# Patient Record
Sex: Female | Born: 1935 | Race: White | Hispanic: No | Smoking: Never smoker
Health system: Southern US, Community
[De-identification: ages and names within clinical notes are randomized; demographics above are authoritative.]

## PROBLEM LIST (undated history)

## (undated) DIAGNOSIS — M1990 Unspecified osteoarthritis, unspecified site: Secondary | ICD-10-CM

## (undated) DIAGNOSIS — I10 Essential (primary) hypertension: Secondary | ICD-10-CM

## (undated) DIAGNOSIS — E119 Type 2 diabetes mellitus without complications: Secondary | ICD-10-CM

## (undated) DIAGNOSIS — I2510 Atherosclerotic heart disease of native coronary artery without angina pectoris: Secondary | ICD-10-CM

## (undated) DIAGNOSIS — Z8673 Personal history of transient ischemic attack (TIA), and cerebral infarction without residual deficits: Secondary | ICD-10-CM

## (undated) HISTORY — DX: Personal history of transient ischemic attack (TIA), and cerebral infarction without residual deficits: Z86.73

## (undated) HISTORY — PX: CHOLECYSTECTOMY: SHX55

## (undated) HISTORY — PX: APPENDECTOMY: SHX54

---

## 2002-04-06 ENCOUNTER — Encounter: Payer: Self-pay | Admitting: Internal Medicine

## 2002-04-06 ENCOUNTER — Ambulatory Visit (HOSPITAL_COMMUNITY): Admission: RE | Admit: 2002-04-06 | Discharge: 2002-04-06 | Payer: Self-pay | Admitting: Internal Medicine

## 2005-01-09 ENCOUNTER — Ambulatory Visit (HOSPITAL_COMMUNITY): Admission: RE | Admit: 2005-01-09 | Discharge: 2005-01-09 | Payer: Self-pay | Admitting: Internal Medicine

## 2005-03-27 ENCOUNTER — Ambulatory Visit (HOSPITAL_COMMUNITY): Admission: RE | Admit: 2005-03-27 | Discharge: 2005-03-27 | Payer: Self-pay | Admitting: *Deleted

## 2010-01-29 ENCOUNTER — Emergency Department (HOSPITAL_COMMUNITY)
Admission: EM | Admit: 2010-01-29 | Discharge: 2010-01-30 | Payer: Self-pay | Source: Home / Self Care | Admitting: Emergency Medicine

## 2010-08-09 ENCOUNTER — Ambulatory Visit (HOSPITAL_COMMUNITY)
Admission: RE | Admit: 2010-08-09 | Discharge: 2010-08-09 | Payer: Self-pay | Source: Home / Self Care | Attending: Orthopaedic Surgery | Admitting: Orthopaedic Surgery

## 2010-08-20 ENCOUNTER — Ambulatory Visit (HOSPITAL_COMMUNITY)
Admission: RE | Admit: 2010-08-20 | Discharge: 2010-08-20 | Payer: Self-pay | Source: Home / Self Care | Attending: Family Medicine | Admitting: Family Medicine

## 2010-10-02 ENCOUNTER — Ambulatory Visit (INDEPENDENT_AMBULATORY_CARE_PROVIDER_SITE_OTHER): Payer: Self-pay | Admitting: Internal Medicine

## 2010-11-13 ENCOUNTER — Ambulatory Visit (INDEPENDENT_AMBULATORY_CARE_PROVIDER_SITE_OTHER): Payer: Self-pay | Admitting: Internal Medicine

## 2010-12-07 NOTE — Procedures (Signed)
[name] [name], [name] [name]           ACCOUNT NO.:  [id number]   MEDICAL RECORD NO.:  [id number]          PATIENT TYPE:  OUT   LOCATION:  RAD                           FACILITY:  [facility]   PHYSICIAN:  Dani Gobble, MD       DATE OF BIRTH:  [birth year]   DATE OF PROCEDURE:  03/27/2005  DATE OF DISCHARGE:                                  ECHOCARDIOGRAM   PROCEDURE:  Echocardiogram.   REFERRING PHYSICIANS:  1.  Tesfaye D. Felecia Shelling, M.D.  2.  Dani Gobble, MD.   INDICATIONS:  [name] is a 77 year old female with diabetes mellitus  who was found to have a cardiac murmur.   The aorta is within normal limits measured 2.8 cm.   Left atrium also appears to be within normal limits, measured at 3.9 cm. The  patient appeared to be in sinus rhythm during the procedure.   The intraventricular septum appears mildly thickened while the posterior  wall appears normal in thickness.   The aortic valve is not well visualized but appeared to be mildly thickened  but with normal leaflet excursion.  No significant aortic insufficiency is  noted.  Doppler interrogation of the aortic valve suggests mild aortic  sclerosis without hemodynamically significant aortic stenosis.   The mitral valve appears grossly structurally normal with mild mitral  annular calcification.  There is no limitation to leaflet excursion.  No  mitral valve prolapse is noted.  Mild mitral regurgitation is noted.  Doppler interrogation of the mitral valve is within normal limits.   The pulmonic valve is incompletely visualized.   Tricuspid valve appears grossly structurally normal with mild tricuspid  regurgitation noted.  There is no suggestion of  pulmonary hypertension on  this study.   The left ventricle is normal in size with LV IDD measured at 4.5 cm and LV  IC measured 3.1 cm.  Overall left ventricular systolic function is normal  and no regional wall motion abnormalities are noted.  The presence of  diastolic  dysfunction is inferred from pulse wave Doppler across the mitral  valve.   The right atrium and right ventricle appear normal in size and right  ventricular systolic function appears normal.   IMPRESSION:  1.  Mild asymmetric septal hypertrophy.  2.  Mild aortic sclerosis without aortic stenosis.  3.  Mild mitral and tricuspid regurgitation.  4.  Normal left ventricular size and systolic function without regional wall      motion abnormality noted.  5.  Presence of diastolic dysfunction is inferred from pulse wave Doppler      across the mitral valve.           ______________________________  Dani Gobble, MD     AB/MEDQ  D:  03/27/2005  T:  03/28/2005  Job:  [id number]   cc:   Tesfaye D. Felecia Shelling, MD  [address]  [place]  [place] [address]  Fax: [phone]   Dani Gobble, MD  Fax: [phone]

## 2011-01-28 ENCOUNTER — Other Ambulatory Visit: Payer: Self-pay | Admitting: Medical

## 2013-06-30 ENCOUNTER — Inpatient Hospital Stay: Payer: Self-pay | Admitting: Psychiatry

## 2013-06-30 LAB — COMPREHENSIVE METABOLIC PANEL
Albumin: 3.3 g/dL — ABNORMAL LOW (ref 3.4–5.0)
Alkaline Phosphatase: 83 U/L
Anion Gap: 6 — ABNORMAL LOW (ref 7–16)
BUN: 9 mg/dL (ref 7–18)
Bilirubin,Total: 0.5 mg/dL (ref 0.2–1.0)
Calcium, Total: 9.1 mg/dL (ref 8.5–10.1)
Chloride: 104 mmol/L (ref 98–107)
Creatinine: 0.8 mg/dL (ref 0.60–1.30)
EGFR (Non-African Amer.): >60
Osmolality: 277 (ref 275–301)
SGOT(AST): 38 U/L — ABNORMAL HIGH (ref 15–37)
SGPT (ALT): 37 U/L (ref 12–78)

## 2013-06-30 LAB — URINALYSIS, COMPLETE
Bilirubin,UR: NEGATIVE
Blood: NEGATIVE
Glucose,UR: NEGATIVE mg/dL (ref 0–75)
Nitrite: POSITIVE
Protein: NEGATIVE
RBC,UR: 4 /HPF (ref 0–5)
Specific Gravity: 1.014 (ref 1.003–1.030)
WBC UR: 20 /HPF (ref 0–5)

## 2013-06-30 LAB — ETHANOL
Ethanol %: <0.003 % (ref 0.000–0.080)
Ethanol: <3 mg/dL

## 2013-06-30 LAB — CBC
HCT: 45.4 % (ref 35.0–47.0)
MCH: 29.8 pg (ref 26.0–34.0)
MCV: 90 fL (ref 80–100)
Platelet: 210 10*3/uL (ref 150–440)
RDW: 14.2 % (ref 11.5–14.5)

## 2013-06-30 LAB — DRUG SCREEN, URINE
Cocaine Metabolite,Ur ~~LOC~~: NEGATIVE (ref ?–300)
Methadone, Ur Screen: NEGATIVE (ref ?–300)
Phencyclidine (PCP) Ur S: NEGATIVE (ref ?–25)

## 2013-06-30 LAB — SALICYLATE LEVEL: Salicylates, Serum: <1.7 mg/dL

## 2013-07-03 LAB — BASIC METABOLIC PANEL WITH GFR
Anion Gap: 6 — ABNORMAL LOW
BUN: 16 mg/dL
Calcium, Total: 9.1 mg/dL
Chloride: 105 mmol/L
Co2: 28 mmol/L
Creatinine: 1 mg/dL
EGFR (African American): >60
EGFR (Non-African Amer.): 54 — ABNORMAL LOW
Glucose: 177 mg/dL — ABNORMAL HIGH
Osmolality: 283
Potassium: 4.1 mmol/L
Sodium: 139 mmol/L

## 2013-07-03 LAB — URINALYSIS, COMPLETE
Bacteria: NONE SEEN
Bilirubin,UR: NEGATIVE
Blood: NEGATIVE
Glucose,UR: NEGATIVE mg/dL
Ketone: NEGATIVE
Leukocyte Esterase: NEGATIVE
Nitrite: NEGATIVE
Ph: 6
Protein: NEGATIVE
RBC,UR: <1 /HPF
Specific Gravity: 1.009
Squamous Epithelial: <1
WBC UR: NONE SEEN /HPF

## 2013-07-03 LAB — CBC WITH DIFFERENTIAL/PLATELET
Basophil #: 0 10*3/uL (ref 0.0–0.1)
Eosinophil #: 0.2 10*3/uL (ref 0.0–0.7)
Eosinophil %: 2.3 %
HGB: 13.2 g/dL (ref 12.0–16.0)
Lymphocyte #: 1.2 10*3/uL (ref 1.0–3.6)
MCH: 28.6 pg (ref 26.0–34.0)
MCHC: 32 g/dL (ref 32.0–36.0)
Monocyte #: 0.7 x10 3/mm (ref 0.2–0.9)
Monocyte %: 9.5 %
Platelet: 172 10*3/uL (ref 150–440)
RDW: 14 % (ref 11.5–14.5)

## 2013-07-05 LAB — URINE CULTURE

## 2013-07-11 ENCOUNTER — Inpatient Hospital Stay: Payer: Self-pay | Admitting: Student

## 2013-07-11 LAB — URINALYSIS, COMPLETE
Glucose,UR: 50 mg/dL (ref 0–75)
Hyaline Cast: 36
Nitrite: NEGATIVE
Protein: 100
RBC,UR: 2 /HPF (ref 0–5)
Squamous Epithelial: 3

## 2013-07-11 LAB — CREATININE, SERUM: Creatinine: 1.41 mg/dL — ABNORMAL HIGH (ref 0.60–1.30)

## 2013-07-12 LAB — CBC WITH DIFFERENTIAL/PLATELET
Basophil #: 0.2 10*3/uL — ABNORMAL HIGH (ref 0.0–0.1)
Basophil %: 1.2 %
Eosinophil #: 0 10*3/uL (ref 0.0–0.7)
Lymphocyte #: 1.4 10*3/uL (ref 1.0–3.6)
Lymphocyte %: 9.4 %
MCH: 28 pg (ref 26.0–34.0)
MCV: 90 fL (ref 80–100)
Neutrophil #: 12.3 10*3/uL — ABNORMAL HIGH (ref 1.4–6.5)
RBC: 4.32 10*6/uL (ref 3.80–5.20)
RDW: 14.3 % (ref 11.5–14.5)
WBC: 15 10*3/uL — ABNORMAL HIGH (ref 3.6–11.0)

## 2013-07-12 LAB — BASIC METABOLIC PANEL
Co2: 25 mmol/L (ref 21–32)
Creatinine: 1.35 mg/dL — ABNORMAL HIGH (ref 0.60–1.30)
EGFR (African American): 43 — ABNORMAL LOW
EGFR (Non-African Amer.): 38 — ABNORMAL LOW
Osmolality: 287 (ref 275–301)
Sodium: 139 mmol/L (ref 136–145)

## 2013-07-12 LAB — TSH: Thyroid Stimulating Horm: 0.691 u[IU]/mL

## 2013-07-12 LAB — MAGNESIUM: Magnesium: 2 mg/dL

## 2013-07-13 LAB — CBC WITH DIFFERENTIAL/PLATELET
Basophil #: 0 10*3/uL (ref 0.0–0.1)
Eosinophil #: 0.2 10*3/uL (ref 0.0–0.7)
Eosinophil %: 1.9 %
HCT: 35.3 % (ref 35.0–47.0)
Lymphocyte #: 1.4 10*3/uL (ref 1.0–3.6)
MCH: 29.6 pg (ref 26.0–34.0)
Monocyte #: 0.8 x10 3/mm (ref 0.2–0.9)
Neutrophil #: 6.4 10*3/uL (ref 1.4–6.5)
Neutrophil %: 72.5 %
Platelet: 180 10*3/uL (ref 150–440)

## 2013-07-13 LAB — BASIC METABOLIC PANEL
Anion Gap: 5 — ABNORMAL LOW (ref 7–16)
Calcium, Total: 7.8 mg/dL — ABNORMAL LOW (ref 8.5–10.1)
Chloride: 112 mmol/L — ABNORMAL HIGH (ref 98–107)
Co2: 24 mmol/L (ref 21–32)
Creatinine: 0.74 mg/dL (ref 0.60–1.30)
EGFR (African American): >60
EGFR (Non-African Amer.): >60
Osmolality: 284 (ref 275–301)
Potassium: 4.5 mmol/L (ref 3.5–5.1)

## 2013-07-14 LAB — URINE CULTURE

## 2013-07-17 LAB — CULTURE, BLOOD (SINGLE)

## 2014-09-15 IMAGING — CR DG CHEST 1V PORT
1 series · 1 of 1 positions shown · non-contrast
Comparison: 06/30/2013.

CLINICAL DATA: Shock.

EXAM:
PORTABLE CHEST - 1 VIEW

[ap]
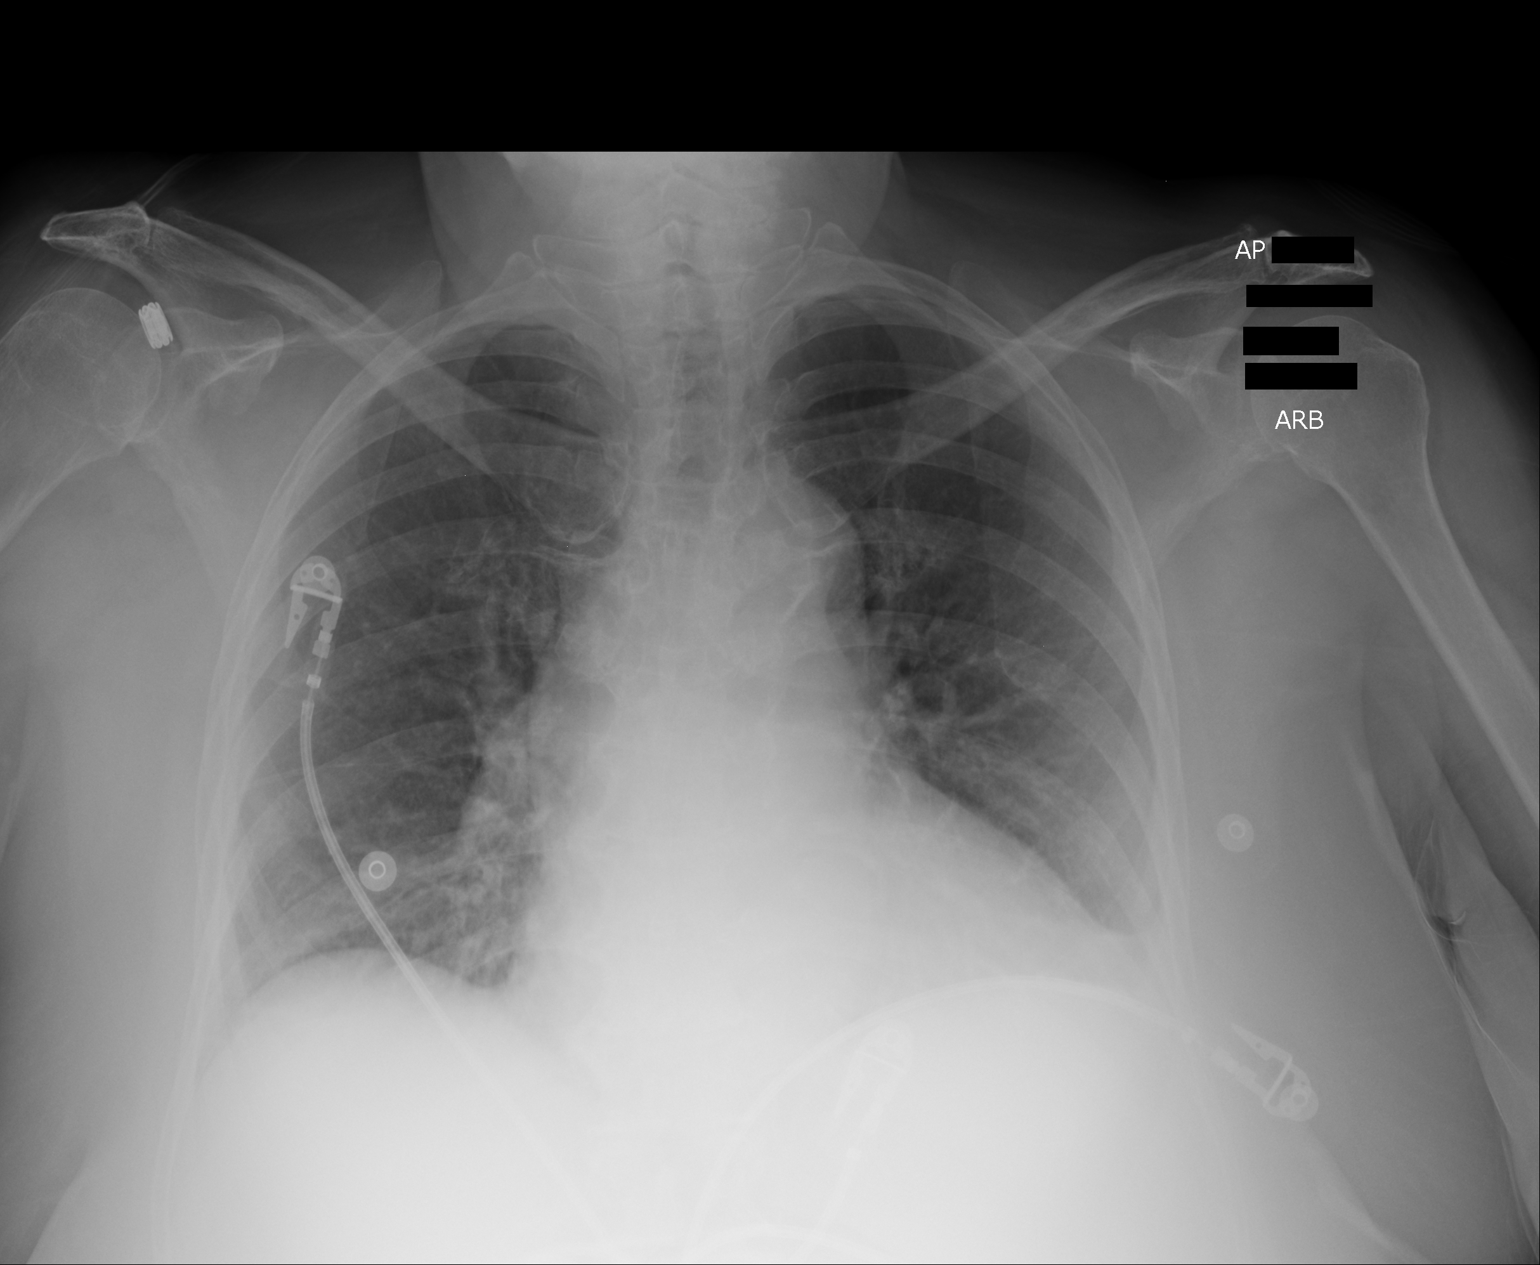

[1 of 1 positions shown; findings below may reference images not displayed]

FINDINGS: Low volume chest. The low volumes accentuate the cardiopericardial
silhouette which is mildly enlarged for projection. Aortic arch
atherosclerosis. Basilar atelectasis. No definite airspace disease
or effusion. Monitoring leads project over the chest.
IMPRESSION: Low volume chest.
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
## 2014-11-11 NOTE — Consult Note (Signed)
Psychiatry: Followup note for this woman with depression and currently being treated on the medical service for urinary tract infection. She tells me today that she is still feeling sick. She feels weak all over and doesn't feel like eating. She still has pain in her right flank. She has not gotten up out of bed today. Her mood is still down and dysphoric. Denies suicidal ideation denies hallucinations. to patient that he urinary tract infection can leave her feeling this sick. Explained to her the importance of staying hydrated. No change to psychiatric medicine. I see from the internal medicine note that she may be ready for transfer back to behavioral health tomorrow. That will be fine. I will followup then.  Electronic Signatures: Clapacs, Jackquline DenmarkJohn T (MD)  (Signed on 23-Dec-14 17:25)  Authored  Last Updated: 23-Dec-14 17:25 by Audery Amellapacs, John T (MD)

## 2014-11-11 NOTE — Consult Note (Signed)
Psychiatry: Followup for this 81 year old woman who had been on the behavioral health unit for depression.s He was transferred to the medical unit after becoming acutely sick. Patient seems to have probably had sepsis related to a urinary tract infection. Today she is awake and alert when I came to see her. She has relatives visiting her. Patient is better groomed than I have seen previously. Her eye contact is good. She spoke in a normal tone. Her affect is still slightly blunted but she says that she is feeling better and she seems more energetic than previously. She has been able to get up and walk around the room a little bit. Not reporting suicidal ideation. I understand that the medicine service preferred to have her stay on medical for another day in part at the patient's request but also because she is still weak. Patient asked me today if she could stay on the medical service until Friday. Told her that was entirely up to the internal medicine doctors. I will be glad to take her back to behavioral health whenever medicine requests. The patient's reasons for staying on the medical floor is that it'll give her another day to recover her strength. No change to psychiatric medicine. Supportive therapy completed. If she does need to be transferred to psychiatry tomorrow please call the psychiatrist on call.  Electronic Signatures: Holland Kotter, Jackquline DenmarkJohn T (MD)  (Signed on 24-Dec-14 16:50)  Authored  Last Updated: 24-Dec-14 16:50 by Audery Amellapacs, Brinklee Cisse T (MD)
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
## 2014-11-11 NOTE — Consult Note (Signed)
Brief Consult Note: Diagnosis: Major depressive disorder.   Patient was seen by consultant.   Consult note dictated.   Recommend further assessment or treatment.   Orders entered.   Comments: Psychiatry: Patient seen for consult. Elderly woman who was recently admitted to behavioral health unit. Transferred to the critical care unit for very low blood pressure and treated for UTI. The initial plan was to trabnsfer patient back to psychiatry. However, she feels much better physically and mentally. She is no longer depressed, affect is brighter. She is not suicidal or homicidal. She is optimistic about the future as she is NOT returning to her old nursing home where she had been very unhappy but a new one that she hopes is better. Her daughter is in the room. Everybody agrees with the plan.   PLAN: 1. The patient does not meet criteria for IVC. Please discharge as appropriate.  2. I will d/c admit to psychiatry order.  3. I will provide Rx for new medications of Remeron 45 mg at night, Abilify 5 mg daily and Cipro twice daily for 3 more days..  Electronic Signatures: Kristine LineaPucilowska, Shalanda Brogden (MD)  (Signed 25-Dec-14 18:39)  Authored: Brief Consult Note   Last Updated: 25-Dec-14 18:39 by Kristine LineaPucilowska, Latise Dilley (MD)

## 2014-11-11 NOTE — Consult Note (Signed)
Brief Consult Note: Diagnosis: major depression.   Patient was seen by consultant.   Recommend further assessment or treatment.   Orders entered.   Comments: Psychiatry: PAtient seen. Patient with depression. [facility]  Electronic Signatures: Clapacs, Jackquline DenmarkJohn T (MD)  (Signed 10-Dec-14 18:53)  Authored: Brief Consult Note   Last Updated: 10-Dec-14 18:53 by Audery Amellapacs, John T (MD)

## 2014-11-11 NOTE — Consult Note (Signed)
Brief Consult Note: Diagnosis: Maj. depression.   Patient was seen by consultant.   Consult note dictated.   Comments: Psychiatry: Patient seen for consult. Elderly woman who was recently on my service on the behavioral health unit. Transferred to the critical care unit yesterday after becoming faint and weak and being found with very low blood pressure. Patient seems to be recovering. Mood is still anxious but about the same as it was before. Full note dictated. No change to medicine. She can be transferred her back to psychiatry as soon as she is medically stable.  Electronic Signatures: Clapacs, Jackquline DenmarkJohn T (MD)  (Signed 22-Dec-14 14:49)  Authored: Brief Consult Note   Last Updated: 22-Dec-14 14:49 by Audery Amellapacs, John T (MD)

## 2014-11-12 NOTE — Discharge Summary (Signed)
PATIENT NAME:  [name] [name], [name] [name] MR#:  [id number] DATE OF BIRTH:  1933/03/11  DATE OF ADMISSION:  07/11/2013 DATE OF DISCHARGE:  07/15/2013   CONSULTANTS: Dr. Toni Amend from psychiatry.   CHIEF COMPLAINT: Patient transferred from Behavioral Medicine for a systolic blood pressure in the 60s.   DISCHARGE DIAGNOSES:  1. Sepsis.  2. Urinary tract infection.  3. Hypotension, possibly from sepsis, possible from vasovagal response.  4. Mild acute renal failure.  5. Diabetes.  6. Hypertension.  7. Anxiety.  8. Depression.   DISCHARGE MEDICATIONS:  1. Cipro 250 mg 2 times a day until finished night of the 28th of December.  2. Lisinopril 20 mg daily.  3. Mirtazapine 45 mg at bedtime.  4. Metformin 500 mg 2 times a day. 5. Aripiprazole 5 mg once a day.  6. Diazepam 5 mg once a day at bedtime and 2.5 mg once a day in the morning.   DIET: Low-sodium, ADA diet.   ACTIVITY: As tolerated.   FOLLOWUP: Please follow with PCP within 1 to 2 weeks.   DISPOSITION: Transfer back to Behavioral Medicine.   SIGNIFICANT LABORATORIES AND IMAGING: Initial creatinine of 1.41, last creatinine of 0.74, last sodium 141, potassium 4.5. Initial white count of 15, hemoglobin 12.1, last white count of 8.8. Blood cultures have been no growth to date from the 22nd. Urine cultures from the 21st grew Klebsiella, Enterococcus and Proteus. UA on arrival was suggestive for UTI with 1+ leukocyte esterase, 23 WBC, trace bacteria. X-ray of the chest, 1-view: Low volume chest, otherwise unremarkable. Repeat x-ray on the 23rd of the chest showing mild left basilar airspace opacity, may reflect atelectasis or possible pneumonia, small left pleural effusion.   HISTORY OF PRESENT ILLNESS AND HOSPITAL COURSE: For full details of H and P, please see the dictation on December 21 by Dr. Mordecai Maes, but briefly this is a 81 year old female who was at Behavioral Medicine, who developed hypotension. The patient had come in with depression,  poor concentration and lack of interest. She had gone to the bathroom and felt weak, could not get up and felt her face was hot, had some nausea and was noted to have a systolic blood pressure in the 60s during a rapid response. She had no fever. She had been treated for UTI. She initially was started on IV fluids and transitioned to CCU for possible shock; however, the patient did not require any pressors and did not have shock. The patient did have transient hypotension, and per description, could have been secondary to a vasovagal response, but I suspect that given the patient's sepsis with leukocytosis, tachycardia and dirty UA as well as positive urine cultures, sepsis played a larger roll. Her blood pressure medications were held, and after hypotension improved, the blood pressure medications were continued. The urine cultures did grow 3 different organisms, but they are all susceptible to Cipro. She will be discharged with Cipro until the end of the 28th. Her blood pressure currently is in the 150s to 160s range, and her lisinopril has been continued. If the blood pressure remains elevated, low-dose amlodipine can be started. The patient did have mild renal failure as well, possibly from sepsis, which has resolved with IV fluids. She will be transitioned back to Behavioral Medicine for her ongoing depression, anxiety issues.   TOTAL TIME SPENT: 35 minutes.   CODE STATUS: The patient is full code.   ____________________________ Krystal Eaton, MD sa:lb D: 07/15/2013 12:31:58 ET T: 07/15/2013 12:49:13 ET JOB#: [id number]  cc:  Krystal EatonShayiq Tenzin Pavon, MD, <Dictator> Krystal EatonSHAYIQ Izabelle Daus MD ELECTRONICALLY SIGNED 08/03/2013 11:18

## 2014-11-23 ENCOUNTER — Encounter (HOSPITAL_COMMUNITY): Payer: Self-pay | Admitting: *Deleted

## 2014-11-23 ENCOUNTER — Emergency Department (HOSPITAL_COMMUNITY)
Admission: EM | Admit: 2014-11-23 | Discharge: 2014-11-23 | Disposition: A | Discharge status: Home / Self Care | Payer: Medicare Other | Attending: Emergency Medicine | Admitting: Emergency Medicine

## 2014-11-23 DIAGNOSIS — E119 Type 2 diabetes mellitus without complications: Secondary | ICD-10-CM | POA: Insufficient documentation

## 2014-11-23 DIAGNOSIS — I10 Essential (primary) hypertension: Secondary | ICD-10-CM | POA: Diagnosis not present

## 2014-11-23 DIAGNOSIS — R600 Localized edema: Secondary | ICD-10-CM | POA: Insufficient documentation

## 2014-11-23 DIAGNOSIS — Z5900 Homelessness unspecified: Secondary | ICD-10-CM

## 2014-11-23 DIAGNOSIS — I25119 Atherosclerotic heart disease of native coronary artery with unspecified angina pectoris: Secondary | ICD-10-CM | POA: Insufficient documentation

## 2014-11-23 DIAGNOSIS — M1990 Unspecified osteoarthritis, unspecified site: Secondary | ICD-10-CM | POA: Diagnosis not present

## 2014-11-23 DIAGNOSIS — M7989 Other specified soft tissue disorders: Secondary | ICD-10-CM | POA: Diagnosis present

## 2014-11-23 DIAGNOSIS — Z79899 Other long term (current) drug therapy: Secondary | ICD-10-CM | POA: Insufficient documentation

## 2014-11-23 HISTORY — DX: Unspecified osteoarthritis, unspecified site: M19.90

## 2014-11-23 HISTORY — DX: Type 2 diabetes mellitus without complications: E11.9

## 2014-11-23 HISTORY — DX: Atherosclerotic heart disease of native coronary artery without angina pectoris: I25.10

## 2014-11-23 HISTORY — DX: Essential (primary) hypertension: I10

## 2014-11-23 LAB — CBC WITH DIFFERENTIAL/PLATELET
Basophils Absolute: 0 10*3/uL (ref 0.0–0.1)
Basophils Relative: 0 % (ref 0–1)
Eosinophils Absolute: 0.1 10*3/uL (ref 0.0–0.7)
Eosinophils Relative: 2 % (ref 0–5)
HCT: 46.9 % — ABNORMAL HIGH (ref 36.0–46.0)
Hemoglobin: 15 g/dL (ref 12.0–15.0)
Lymphocytes Relative: 20 % (ref 12–46)
Lymphs Abs: 1.5 10*3/uL (ref 0.7–4.0)
MCH: 29.5 pg (ref 26.0–34.0)
MCHC: 32 g/dL (ref 30.0–36.0)
MCV: 92.1 fL (ref 78.0–100.0)
Monocytes Absolute: 0.5 10*3/uL (ref 0.1–1.0)
Monocytes Relative: 7 % (ref 3–12)
Neutro Abs: 5.3 10*3/uL (ref 1.7–7.7)
Neutrophils Relative %: 71 % (ref 43–77)
Platelets: 215 10*3/uL (ref 150–400)
RBC: 5.09 MIL/uL (ref 3.87–5.11)
RDW: 13.3 % (ref 11.5–15.5)
WBC: 7.5 10*3/uL (ref 4.0–10.5)

## 2014-11-23 LAB — CBG MONITORING, ED: GLUCOSE-CAPILLARY: 95 mg/dL (ref 70–99)

## 2014-11-23 LAB — COMPREHENSIVE METABOLIC PANEL
ALT: 16 U/L (ref 14–54)
AST: 29 U/L (ref 15–41)
Albumin: 3.6 g/dL (ref 3.5–5.0)
Alkaline Phosphatase: 83 U/L (ref 38–126)
Anion gap: 9 (ref 5–15)
BUN: 9 mg/dL (ref 6–20)
CO2: 28 mmol/L (ref 22–32)
Calcium: 9.2 mg/dL (ref 8.9–10.3)
Chloride: 102 mmol/L (ref 101–111)
Creatinine, Ser: 0.83 mg/dL (ref 0.44–1.00)
GFR calc Af Amer: >60 mL/min (ref >60)
GFR calc non Af Amer: >60 mL/min (ref >60)
Glucose, Bld: 116 mg/dL — ABNORMAL HIGH (ref 70–99)
Potassium: 4.3 mmol/L (ref 3.5–5.1)
Sodium: 139 mmol/L (ref 135–145)
Total Bilirubin: 0.6 mg/dL (ref 0.3–1.2)
Total Protein: 7.4 g/dL (ref 6.5–8.1)

## 2014-11-23 MED ORDER — FUROSEMIDE 40 MG PO TABS
40.0000 mg | ORAL_TABLET | Freq: Once | ORAL | Status: AC
  Administered 2014-11-23: 40 mg via ORAL
  Filled 2014-11-23: qty 1

## 2014-11-23 MED ORDER — ENALAPRIL MALEATE 10 MG PO TABS
10.0000 mg | ORAL_TABLET | Freq: Every day | ORAL | Status: DC
Start: 2014-11-23 — End: 2019-08-03

## 2014-11-23 MED ORDER — OXYCODONE-ACETAMINOPHEN 5-325 MG PO TABS: 1.0000 | ORAL_TABLET | ORAL | Status: DC | PRN

## 2014-11-23 MED ORDER — POTASSIUM CHLORIDE CRYS ER 20 MEQ PO TBCR
40.0000 meq | EXTENDED_RELEASE_TABLET | Freq: Once | ORAL | Status: AC
  Administered 2014-11-23: 40 meq via ORAL
  Filled 2014-11-23: qty 2

## 2014-11-23 NOTE — ED Notes (Signed)
Patient states she feels like blood sugar is dropping.  Checked CBG = 95. Placed patient behind triage. Gave sprite to drink.

## 2014-11-23 NOTE — Discharge Instructions (Signed)
°Emergency Department Resource Guide °1) Find a Doctor and Pay Out of Pocket °Although you won't have to find out who is covered by your insurance plan, it is a good idea to ask around and get recommendations. You will then need to call the office and see if the doctor you have chosen will accept you as a new patient and what types of options they offer for patients who are self-pay. Some doctors offer discounts or will set up payment plans for their patients who do not have insurance, but you will need to ask so you aren't surprised when you get to your appointment. ° °2) Contact Your Local Health Department °Not all health departments have doctors that can see patients for sick visits, but many do, so it is worth a call to see if yours does. If you don't know where your local health department is, you can check in your phone book. The CDC also has a tool to help you locate your state's health department, and many state websites also have listings of all of their local health departments. ° °3) Find a Walk-in Clinic °If your illness is not likely to be very severe or complicated, you may want to try a walk in clinic. These are popping up all over the country in pharmacies, drugstores, and shopping centers. They're usually staffed by nurse practitioners or physician assistants that have been trained to treat common illnesses and complaints. They're usually fairly quick and inexpensive. However, if you have serious medical issues or chronic medical problems, these are probably not your best option. ° °No Primary Care Doctor: °- Call [facility] at  832-8000 - they can help you locate a primary care doctor that  accepts your insurance, provides certain services, etc. °- Physician Referral Service- 1-800-533-3463 ° °Chronic Pain Problems: °Organization         Address  Phone   Notes  °[facility]  (336) 297-2271 Patients need to be referred by their primary care doctor.  ° °Medication  Assistance: °Organization         Address  Phone   Notes  °[place] [facility] [address] °[place], [place] [address] (336) 641-8030 --Must be a resident of [place] °-- Must have NO insurance coverage whatsoever (no Medicaid/ Medicare, etc.) °-- The pt. MUST have a primary care doctor that directs their care regularly and follows them in the community °  °[facility]  (866) 331-1348   °[employer]  (888) 892-1162   ° °Agencies that provide inexpensive medical care: °Organization         Address  Phone   Notes  °[facility]  (336) 832-8035   °[facility]    (336) 832-7272   °[facility] [address] °[place], [place] [address] (336) 832-4777   °[facility] [address], °[place] (336) 271-4999   °[facility]    (336) 373-0678   °[facility]    (336) 272-1050   °[facility] ° [address], [place] Phone:  (336) 832-4444, Fax:  (336) 832-4440 Hours of Operation:  9 am - 6 pm, M-F.  Also accepts Medicaid/Medicare and self-pay.  °[facility] ° [address], [place] Phone: (336) 832-3150, Fax: (336) 832-3151. Hours of Operation:  8:30 am - 5:30 pm, M-F.  Also accepts Medicaid and self-pay.  °[facility] [place] [address], [place] Phone: (336) 878-6027   °[facility] [address], [place], [place] (336)723-1848, Ext. 123 Mondays & Thursdays: 7-9 AM.  First 15 patients are seen on a first come, first serve basis. °  ° °Medicaid-accepting [place] Providers: ° °Organization         Address  Phone   Notes  °[facility] [address], [place] (336) 641-2100 Also accepts self-pay patients.  °[facility] [address], [place] ° (336) 856-9996   °[facility] [address], [place]  (336) 288-8857   °[facility] [address] [place] [address], [place] (336) 299-7000   °Veita Bland [address], [place]  ° (336) 373-1557 Only accepts [place] Medicaid patients after they have their name applied to their card.  ° °Self-Pay (no insurance) in [place]: ° °Organization         Address  Phone   Notes  °Sickle Cell Patients, [facility] [address], [place] (336) 832-1970   °[facility] [address], [place] (336) 832-4400   °[facility] ° [address], [place] (336) 992-4800   °[facility]/Dr. Osei-Bonsu ° [address] [place] [address], [place] or [address], [place] (336) 841-8500 Phone number for both [place] and [place] locations is the same.  °[facility] [address], [place] (336) 299-0000   °[facility] [address] [place] [address], [place] or [address] (336) 852-7530 °(336) 878-2260   °[facility] [address], [place] (336) 350-1642, phone; (336) 294-5005, fax Sees patients 1st and 3rd Saturday of every month.  Must not qualify for public or private insurance (i.e. Medicaid, Medicare, [place] Health Choice, Veterans' Benefits) • Household income should be no more than 200% of the poverty level •The clinic cannot treat you if you are pregnant or think you are pregnant • Sexually transmitted diseases are not treated at the clinic.  ° ° °Dental Care: °Organization         Address  Phone  Notes  °[place] [facility] [address], [place] (336) 641-6152 Accepts children up to age 21 who are enrolled in Medicaid or [place] Health Choice; pregnant women with a Medicaid card; and children who have applied for Medicaid or [place] Health Choice, but were declined, whose parents can pay a reduced fee at time of service.  °[place]  [facility] [place]  [address], [place] (336) 641-7733 Accepts children up to age 21 who are enrolled in Medicaid or [place] Health Choice; pregnant women with a Medicaid card; and children who have applied for Medicaid or [place] Health Choice, but were declined, whose parents can pay a reduced fee at time of service.  °[facility] ° [address], [place] (336) 641-4533 Patients are seen by appointment only. Walk-ins are not accepted. [facility] will see patients 18 years of age and older. °Monday - Tuesday (8am-5pm) °Most Wednesdays (8:30-5pm) °$30 per visit, cash only  °[facility] ° [address], [place] (336) 641-4533 Patients are seen by appointment only. Walk-ins are not accepted. [facility] will see patients 18 years of age and older. °One   Wednesday Evening (Monthly: Volunteer Based).  $30 per visit, cash only  °[facility]  (919) 537-3737 for adults; Children under age 4, call Graduate Pediatric Dentistry at (919) 537-3956. Children aged 4-14, please call (919) 537-3737 to request a pediatric application. ° Dental services are provided in all areas of dental care including fillings, crowns and bridges, complete and partial dentures, implants, gum treatment, root canals, and extractions. Preventive care is also provided. Treatment is provided to both adults and children. °Patients are selected via a lottery and there is often a waiting list. °  °[facility] [address], °[place] ° (336) 763-8833 [email or web address] °  °[facility] [address], [place], [place] (336)723-1848, Ext. 123 Second and Fourth Thursday of each month, opens at 6:30 AM; Clinic ends at 9 AM.  Patients are seen on a first-come first-served basis, and a limited number are seen during each clinic.  ° °[facility] ° [address], [place], [place] (336) 723-7904    Eligibility Requirements °You must have lived in [place], [place], or [place] counties for at least the last three months. °  You cannot be eligible for state or federal sponsored healthcare insurance, including Veterans Administration, Medicaid, or Medicare. °  You generally cannot be eligible for healthcare insurance through your employer.  °  How to apply: °Eligibility screenings are held every Tuesday and Wednesday afternoon from 1:00 pm until 4:00 pm. You do not need an appointment for the interview!  °[facility] [address], [place], [place] 336-631-2330   °[facility]  336-342-8273   °[facility]  336-703-3100   °[facility]  336-570-6415   ° °Behavioral Health Resources in the Community: °Intensive Outpatient Programs °Organization         Address  Phone  Notes  °[facility] [address], [place], [place] 336-878-6098   °[facility] [address], [place], [place] 336-832-9800   °[facility] [address], [place], [place] ° 336-882-2125   °[facility] [address],  °[place], [place] 1-800-853-5163 or 336-641-4981   °Substance Abuse Resources °Organization         Address  Phone  Notes  °[facility]  336-882-2125   °[facility]  336-784-9470   °[facility]  336-285-9073   °[facility]  336-845-3988   °Residential & Outpatient Substance Abuse Program  1-800-659-3381   °Psychological Services °Organization         Address  Phone  Notes  °[facility]  336- 832-9600   °[facility]  336- 378-7881   °[facility] [address], [place] 1-800-853-5163 or 336-641-4981   ° °Mobile Crisis Teams °Organization         Address  Phone  Notes  °[facility], Mobile Crisis Care Unit  1-877-626-1772   °Assertive °[facility] ° [address]  [place], [place] 336-834-9664   °Sharon DeEsch [address] °[place] 336-554-5454   ° °Self-Help/Support Groups °Organization         Address  Phone             Notes  °[facility] [place] - variety of support groups  336- 373-1402 Call for more information  °Narcotics Anonymous (NA), [facility] [address], °[place]   2 meetings at this location  ° °  Residential Treatment Programs °Organization         Address  Phone  Notes  °[facility] [address],    °[place]  1-866-801-8205   °[facility] ° [address], [place], [place] 704-293-8524   °[facility] [address], [place] 336-845-3988 Admissions: 8am-3pm M-F  °[facility] [address]    °[place], [place] 336-841-1104   °[facility] [address], [place], [place] 336-379-7146   °[facility] [address]  °[place], [place] 336-285-9073   °[facility] - Intensive Outpatient [address], [place], [place] 336-852-3033   °[facility] [address]  °[place], [place] 1-877-615-2722 or 336-784-9470   °[facility] [address] [place], [place] 336-227-7417 Accepts Medicaid  °[facility] [address]  °[place] 1-800-659-3381 Substance Abuse/Addiction Treatment  ° °[place] Behavioral Health Resources °Organization         Address  Phone  Notes  °[facility]  (888) 581-9988   °Julie Brannon, PhD [address] [place], [place]   (336) 349-5553 or (336) 951-0000   °[facility]   [address] °[place], [place] (336) 349-4454   °[facility] [address], [place], [place] (336) 342-8316 Insurance/Medicaid/sponsorship through [facility]  °[facility] [address]                                    [place], [place] (336) 342-8316 Therapy/tele-psych/case    °[facility] [address]  ° [place], [place] (336) 349-2233    °Dr. Arfeen  (336) 349-4544   °[facility] [place]  [employer] [place] [facility] 1) [address], [place] °2) [address], [place] °3)  [address], [place] (336) 349-3220 °(336) 342-7768 ° °(336) 342-8140   °[place] Child Abuse [facility] (336) 342-1394 or (336) 342-3537 (After Hours)    ° ° °

## 2014-11-23 NOTE — ED Notes (Addendum)
Pt brought in EMS, EMS reports pt sent here by Sharlet SalinaSharon Neville from DSS. Per EMS, pt living out of car and has increased confusion,paranoia, leg swelling x1 month. Pt alert,mild confusion noted, calm and cooperative in triage. CBG en route 86. Per EMS, pt also without daily meds "for awhile."

## 2014-11-30 NOTE — ED Provider Notes (Signed)
CSN: [id number]     Arrival date & time 11/23/14  1204 History   First MD Initiated Contact with Patient 11/23/14 1609     Chief Complaint  Patient presents with  . Leg Swelling     (Consider location/radiation/quality/duration/timing/severity/associated sxs/prior Treatment) HPI   81yF apparently advised to come to ED by DSS. Homeless and did know what else to do. Apparently had been staying in motel but left because of expense. Had been staying in car with daughter but she left and cannot contact her? Gets $750 from SS monthly. Concerned about not having her meds and LE swelling. No pain. No respiratory complaints.   Past Medical History  Diagnosis Date  . Diabetes mellitus without complication   . Arthritis   . Hypertension   . Coronary artery disease     angina   Past Surgical History  Procedure Laterality Date  . Cholecystectomy    . Appendectomy     History reviewed. No pertinent family history. History  Substance Use Topics  . Smoking status: Never Smoker   . Smokeless tobacco: Never Used  . Alcohol Use: No   OB History    No data available     Review of Systems  All systems reviewed and negative, other than as noted in HPI.   Allergies  Contrast media  Home Medications   Prior to Admission medications   Medication Sig Start Date End Date Taking? Authorizing Provider  metFORMIN (GLUCOPHAGE) 500 MG tablet Take 1 tablet by mouth 2 (two) times daily. 11/07/14  Yes Historical Provider, MD  traMADol (ULTRAM) 50 MG tablet Take 1 tablet by mouth every 4 (four) hours as needed. pain 11/07/14  Yes Historical Provider, MD  enalapril (VASOTEC) 10 MG tablet Take 1 tablet (10 mg total) by mouth daily. 11/23/14   Raeford RazorStephen Nakesha Ebrahim, MD  oxyCODONE-acetaminophen (PERCOCET/ROXICET) 5-325 MG per tablet Take 1-2 tablets by mouth every 4 (four) hours as needed. 11/23/14   Raeford RazorStephen Marche Hottenstein, MD   BP 159/77 mmHg  Pulse 68  Temp(Src) 97.7 F (36.5 C) (Oral)  Resp 18  Ht 5\' 4"  (1.626 m)   Wt 170 lb (77.111 kg)  BMI 29.17 kg/m2  SpO2 97% Physical Exam  Constitutional: She is oriented to person, place, and time. She appears well-developed and well-nourished. No distress.  HENT:  Head: Normocephalic and atraumatic.  Eyes: Conjunctivae are normal. Right eye exhibits no discharge. Left eye exhibits no discharge.  Neck: Neck supple.  Cardiovascular: Normal rate, regular rhythm and normal heart sounds.  Exam reveals no gallop and no friction rub.   No murmur heard. Pulmonary/Chest: Effort normal and breath sounds normal. No respiratory distress.  Abdominal: Soft. She exhibits no distension. There is no tenderness.  Musculoskeletal: She exhibits edema. She exhibits no tenderness.  Mild symmetric LE edema. Lower extremities symmetric as compared to each other. No calf tenderness. Negative Homan's. No palpable cords.   Neurological: She is alert and oriented to person, place, and time. No cranial nerve deficit. She exhibits normal muscle tone. Coordination normal.  Skin: Skin is warm and dry.  Psychiatric: She has a normal mood and affect. Her behavior is normal. Thought content normal.  Nursing note and vitals reviewed.   ED Course  Procedures (including critical care time) Labs Review Labs Reviewed  CBC WITH DIFFERENTIAL/PLATELET - Abnormal; Notable for the following:    HCT 46.9 (*)    All other components within normal limits  COMPREHENSIVE METABOLIC PANEL - Abnormal; Notable for the following:  Glucose, Bld 116 (*)    All other components within normal limits  CBG MONITORING, ED    Imaging Review No results found.   EKG Interpretation None      MDM   Final diagnoses:  Homelessness  Essential hypertension    81yF who apparently was referred to ED because homeless and DSS felt this was best option. Resources provided to patient. She is not interested in staying in ED to speak with social work in am.     Kadrian Partch KohuRaeford Razort, MD 11/30/14 [phone]

## 2015-07-29 DIAGNOSIS — R21 Rash and other nonspecific skin eruption: Secondary | ICD-10-CM | POA: Diagnosis not present

## 2015-07-29 DIAGNOSIS — I10 Essential (primary) hypertension: Secondary | ICD-10-CM | POA: Diagnosis not present

## 2015-08-16 DIAGNOSIS — R21 Rash and other nonspecific skin eruption: Secondary | ICD-10-CM | POA: Diagnosis not present

## 2015-08-16 DIAGNOSIS — J309 Allergic rhinitis, unspecified: Secondary | ICD-10-CM | POA: Diagnosis not present

## 2015-08-24 DIAGNOSIS — R21 Rash and other nonspecific skin eruption: Secondary | ICD-10-CM | POA: Diagnosis not present

## 2015-09-28 DIAGNOSIS — J029 Acute pharyngitis, unspecified: Secondary | ICD-10-CM | POA: Diagnosis not present

## 2015-10-08 DIAGNOSIS — I10 Essential (primary) hypertension: Secondary | ICD-10-CM | POA: Diagnosis not present

## 2015-10-08 DIAGNOSIS — E119 Type 2 diabetes mellitus without complications: Secondary | ICD-10-CM | POA: Diagnosis not present

## 2015-10-08 DIAGNOSIS — J019 Acute sinusitis, unspecified: Secondary | ICD-10-CM | POA: Diagnosis not present

## 2015-10-15 DIAGNOSIS — I10 Essential (primary) hypertension: Secondary | ICD-10-CM | POA: Diagnosis not present

## 2015-10-15 DIAGNOSIS — J019 Acute sinusitis, unspecified: Secondary | ICD-10-CM | POA: Diagnosis not present

## 2015-10-15 DIAGNOSIS — E119 Type 2 diabetes mellitus without complications: Secondary | ICD-10-CM | POA: Diagnosis not present

## 2015-10-22 DIAGNOSIS — J019 Acute sinusitis, unspecified: Secondary | ICD-10-CM | POA: Diagnosis not present

## 2015-10-22 DIAGNOSIS — E119 Type 2 diabetes mellitus without complications: Secondary | ICD-10-CM | POA: Diagnosis not present

## 2015-10-22 DIAGNOSIS — I10 Essential (primary) hypertension: Secondary | ICD-10-CM | POA: Diagnosis not present

## 2015-10-30 DIAGNOSIS — F419 Anxiety disorder, unspecified: Secondary | ICD-10-CM | POA: Diagnosis not present

## 2015-11-05 DIAGNOSIS — I10 Essential (primary) hypertension: Secondary | ICD-10-CM | POA: Diagnosis not present

## 2015-11-05 DIAGNOSIS — F419 Anxiety disorder, unspecified: Secondary | ICD-10-CM | POA: Diagnosis not present

## 2015-11-05 DIAGNOSIS — E119 Type 2 diabetes mellitus without complications: Secondary | ICD-10-CM | POA: Diagnosis not present

## 2015-11-12 DIAGNOSIS — I10 Essential (primary) hypertension: Secondary | ICD-10-CM | POA: Diagnosis not present

## 2015-11-12 DIAGNOSIS — E119 Type 2 diabetes mellitus without complications: Secondary | ICD-10-CM | POA: Diagnosis not present

## 2015-11-12 DIAGNOSIS — F419 Anxiety disorder, unspecified: Secondary | ICD-10-CM | POA: Diagnosis not present

## 2015-12-03 DIAGNOSIS — E119 Type 2 diabetes mellitus without complications: Secondary | ICD-10-CM | POA: Diagnosis not present

## 2015-12-03 DIAGNOSIS — I10 Essential (primary) hypertension: Secondary | ICD-10-CM | POA: Diagnosis not present

## 2015-12-03 DIAGNOSIS — F419 Anxiety disorder, unspecified: Secondary | ICD-10-CM | POA: Diagnosis not present

## 2016-04-24 DIAGNOSIS — K7460 Unspecified cirrhosis of liver: Secondary | ICD-10-CM | POA: Diagnosis not present

## 2016-04-24 DIAGNOSIS — I10 Essential (primary) hypertension: Secondary | ICD-10-CM | POA: Diagnosis not present

## 2016-04-24 DIAGNOSIS — Z780 Asymptomatic menopausal state: Secondary | ICD-10-CM | POA: Diagnosis not present

## 2016-04-24 DIAGNOSIS — F419 Anxiety disorder, unspecified: Secondary | ICD-10-CM | POA: Diagnosis not present

## 2016-04-24 DIAGNOSIS — R300 Dysuria: Secondary | ICD-10-CM | POA: Diagnosis not present

## 2016-04-24 DIAGNOSIS — E118 Type 2 diabetes mellitus with unspecified complications: Secondary | ICD-10-CM | POA: Diagnosis not present

## 2016-04-24 DIAGNOSIS — N390 Urinary tract infection, site not specified: Secondary | ICD-10-CM | POA: Diagnosis not present

## 2016-04-24 DIAGNOSIS — R05 Cough: Secondary | ICD-10-CM | POA: Diagnosis not present

## 2016-04-25 DIAGNOSIS — M545 Low back pain: Secondary | ICD-10-CM | POA: Diagnosis not present

## 2016-05-09 ENCOUNTER — Other Ambulatory Visit: Payer: Self-pay | Admitting: Internal Medicine

## 2016-05-09 DIAGNOSIS — R945 Abnormal results of liver function studies: Secondary | ICD-10-CM

## 2016-08-13 DIAGNOSIS — K7689 Other specified diseases of liver: Secondary | ICD-10-CM | POA: Diagnosis not present

## 2016-08-13 DIAGNOSIS — E118 Type 2 diabetes mellitus with unspecified complications: Secondary | ICD-10-CM | POA: Diagnosis not present

## 2016-08-13 DIAGNOSIS — I10 Essential (primary) hypertension: Secondary | ICD-10-CM | POA: Diagnosis not present

## 2016-08-13 DIAGNOSIS — E7800 Pure hypercholesterolemia, unspecified: Secondary | ICD-10-CM | POA: Diagnosis not present

## 2016-08-13 DIAGNOSIS — Z5181 Encounter for therapeutic drug level monitoring: Secondary | ICD-10-CM | POA: Diagnosis not present

## 2016-08-14 ENCOUNTER — Other Ambulatory Visit: Payer: Self-pay | Admitting: Internal Medicine

## 2016-08-14 DIAGNOSIS — R945 Abnormal results of liver function studies: Secondary | ICD-10-CM

## 2016-11-05 DIAGNOSIS — B351 Tinea unguium: Secondary | ICD-10-CM | POA: Diagnosis not present

## 2016-11-05 DIAGNOSIS — R21 Rash and other nonspecific skin eruption: Secondary | ICD-10-CM | POA: Diagnosis not present

## 2016-11-05 DIAGNOSIS — F419 Anxiety disorder, unspecified: Secondary | ICD-10-CM | POA: Diagnosis not present

## 2016-11-20 DIAGNOSIS — F419 Anxiety disorder, unspecified: Secondary | ICD-10-CM | POA: Diagnosis not present

## 2016-11-20 DIAGNOSIS — Z5181 Encounter for therapeutic drug level monitoring: Secondary | ICD-10-CM | POA: Diagnosis not present

## 2016-11-20 DIAGNOSIS — I10 Essential (primary) hypertension: Secondary | ICD-10-CM | POA: Diagnosis not present

## 2016-11-20 DIAGNOSIS — R21 Rash and other nonspecific skin eruption: Secondary | ICD-10-CM | POA: Diagnosis not present

## 2016-11-20 DIAGNOSIS — E118 Type 2 diabetes mellitus with unspecified complications: Secondary | ICD-10-CM | POA: Diagnosis not present

## 2016-11-20 DIAGNOSIS — B001 Herpesviral vesicular dermatitis: Secondary | ICD-10-CM | POA: Diagnosis not present

## 2016-12-24 DIAGNOSIS — Z79899 Other long term (current) drug therapy: Secondary | ICD-10-CM | POA: Diagnosis not present

## 2016-12-24 DIAGNOSIS — R21 Rash and other nonspecific skin eruption: Secondary | ICD-10-CM | POA: Diagnosis not present

## 2016-12-24 DIAGNOSIS — L0390 Cellulitis, unspecified: Secondary | ICD-10-CM | POA: Diagnosis not present

## 2016-12-24 DIAGNOSIS — Z5181 Encounter for therapeutic drug level monitoring: Secondary | ICD-10-CM | POA: Diagnosis not present

## 2016-12-24 DIAGNOSIS — F419 Anxiety disorder, unspecified: Secondary | ICD-10-CM | POA: Diagnosis not present

## 2017-01-16 DIAGNOSIS — R21 Rash and other nonspecific skin eruption: Secondary | ICD-10-CM | POA: Diagnosis not present

## 2017-06-17 DIAGNOSIS — F419 Anxiety disorder, unspecified: Secondary | ICD-10-CM | POA: Diagnosis not present

## 2017-06-17 DIAGNOSIS — R413 Other amnesia: Secondary | ICD-10-CM | POA: Diagnosis not present

## 2017-06-17 DIAGNOSIS — Z79899 Other long term (current) drug therapy: Secondary | ICD-10-CM | POA: Diagnosis not present

## 2017-06-17 DIAGNOSIS — M545 Low back pain: Secondary | ICD-10-CM | POA: Diagnosis not present

## 2017-06-17 DIAGNOSIS — E118 Type 2 diabetes mellitus with unspecified complications: Secondary | ICD-10-CM | POA: Diagnosis not present

## 2017-06-17 DIAGNOSIS — R634 Abnormal weight loss: Secondary | ICD-10-CM | POA: Diagnosis not present

## 2017-06-19 ENCOUNTER — Other Ambulatory Visit: Payer: Self-pay | Admitting: Internal Medicine

## 2017-06-19 DIAGNOSIS — R634 Abnormal weight loss: Secondary | ICD-10-CM

## 2017-06-19 DIAGNOSIS — R413 Other amnesia: Secondary | ICD-10-CM

## 2017-10-27 DIAGNOSIS — K1120 Sialoadenitis, unspecified: Secondary | ICD-10-CM | POA: Diagnosis not present

## 2017-10-27 DIAGNOSIS — K115 Sialolithiasis: Secondary | ICD-10-CM | POA: Diagnosis not present

## 2017-10-27 DIAGNOSIS — I10 Essential (primary) hypertension: Secondary | ICD-10-CM | POA: Diagnosis not present

## 2017-11-12 DIAGNOSIS — Z79899 Other long term (current) drug therapy: Secondary | ICD-10-CM | POA: Diagnosis not present

## 2017-11-12 DIAGNOSIS — Z5181 Encounter for therapeutic drug level monitoring: Secondary | ICD-10-CM | POA: Diagnosis not present

## 2017-11-12 DIAGNOSIS — E118 Type 2 diabetes mellitus with unspecified complications: Secondary | ICD-10-CM | POA: Diagnosis not present

## 2017-11-12 DIAGNOSIS — I10 Essential (primary) hypertension: Secondary | ICD-10-CM | POA: Diagnosis not present

## 2017-11-12 DIAGNOSIS — F419 Anxiety disorder, unspecified: Secondary | ICD-10-CM | POA: Diagnosis not present

## 2017-11-19 DIAGNOSIS — E118 Type 2 diabetes mellitus with unspecified complications: Secondary | ICD-10-CM | POA: Diagnosis not present

## 2017-11-19 DIAGNOSIS — K7460 Unspecified cirrhosis of liver: Secondary | ICD-10-CM | POA: Diagnosis not present

## 2017-11-19 DIAGNOSIS — F419 Anxiety disorder, unspecified: Secondary | ICD-10-CM | POA: Diagnosis not present

## 2017-11-19 DIAGNOSIS — Z780 Asymptomatic menopausal state: Secondary | ICD-10-CM | POA: Diagnosis not present

## 2017-11-19 DIAGNOSIS — E7800 Pure hypercholesterolemia, unspecified: Secondary | ICD-10-CM | POA: Diagnosis not present

## 2017-11-19 DIAGNOSIS — Z79899 Other long term (current) drug therapy: Secondary | ICD-10-CM | POA: Diagnosis not present

## 2017-11-19 DIAGNOSIS — I10 Essential (primary) hypertension: Secondary | ICD-10-CM | POA: Diagnosis not present

## 2018-04-19 DIAGNOSIS — R739 Hyperglycemia, unspecified: Secondary | ICD-10-CM | POA: Diagnosis not present

## 2018-04-19 DIAGNOSIS — R51 Headache: Secondary | ICD-10-CM | POA: Diagnosis not present

## 2018-04-20 ENCOUNTER — Emergency Department (HOSPITAL_BASED_OUTPATIENT_CLINIC_OR_DEPARTMENT_OTHER): Payer: Medicare Other

## 2018-04-20 ENCOUNTER — Other Ambulatory Visit: Payer: Self-pay

## 2018-04-20 ENCOUNTER — Emergency Department (HOSPITAL_BASED_OUTPATIENT_CLINIC_OR_DEPARTMENT_OTHER)
Admission: EM | Admit: 2018-04-20 | Discharge: 2018-04-20 | Disposition: A | Discharge status: Home / Self Care | Payer: Medicare Other | Attending: Emergency Medicine | Admitting: Emergency Medicine

## 2018-04-20 ENCOUNTER — Encounter (HOSPITAL_BASED_OUTPATIENT_CLINIC_OR_DEPARTMENT_OTHER): Payer: Self-pay | Admitting: *Deleted

## 2018-04-20 DIAGNOSIS — Z7984 Long term (current) use of oral hypoglycemic drugs: Secondary | ICD-10-CM | POA: Diagnosis not present

## 2018-04-20 DIAGNOSIS — R519 Headache, unspecified: Secondary | ICD-10-CM

## 2018-04-20 DIAGNOSIS — R51 Headache: Secondary | ICD-10-CM | POA: Insufficient documentation

## 2018-04-20 DIAGNOSIS — I209 Angina pectoris, unspecified: Secondary | ICD-10-CM | POA: Insufficient documentation

## 2018-04-20 DIAGNOSIS — Z79899 Other long term (current) drug therapy: Secondary | ICD-10-CM | POA: Diagnosis not present

## 2018-04-20 DIAGNOSIS — I10 Essential (primary) hypertension: Secondary | ICD-10-CM | POA: Diagnosis not present

## 2018-04-20 DIAGNOSIS — E119 Type 2 diabetes mellitus without complications: Secondary | ICD-10-CM | POA: Insufficient documentation

## 2018-04-20 NOTE — ED Notes (Signed)
Pt c/o headache that isn't relieved by norco. Incidentally, pt is not taking her BP medication nor is she taking her metformin as she is having side effects from both. Pt does not have an appointment to follow up with PCP, advised that she needs to take her maintenance medication and follow up if she is having problems with them.

## 2018-04-20 NOTE — ED Provider Notes (Signed)
[facility] EMERGENCY DEPARTMENT Provider Note   CSN: [id number] Arrival date & time: 04/20/18  1544     History   Chief Complaint Chief Complaint  Patient presents with  . Headache    HPI [name] [name] is a 84 y.o. female.  HPI  84 year old female with headache.  Onset yesterday.  Evaluated at urgent care but symptoms had improved.  She is advised to scan if symptoms return.  She is not having a mild headache again today.  Describes the pain is diffuse but somewhat worse in the frontal region.  No trauma.  No fevers or chills.  Denies any other associated symptoms.  She has no significant headache history.  Past Medical History:  Diagnosis Date  . Arthritis   . Coronary artery disease    angina  . Diabetes mellitus without complication (HCC)   . Hypertension     There are no active problems to display for this patient.   Past Surgical History:  Procedure Laterality Date  . APPENDECTOMY    . CHOLECYSTECTOMY       OB History   None      Home Medications    Prior to Admission medications   Medication Sig Start Date End Date Taking? Authorizing Provider  enalapril (VASOTEC) 10 MG tablet Take 1 tablet (10 mg total) by mouth daily. 11/23/14   Raeford Razor, MD  metFORMIN (GLUCOPHAGE) 500 MG tablet Take 1 tablet by mouth 2 (two) times daily. 11/07/14   [provider]  oxyCODONE-acetaminophen (PERCOCET/ROXICET) 5-325 MG per tablet Take 1-2 tablets by mouth every 4 (four) hours as needed. 11/23/14   Raeford Razor, MD  traMADol (ULTRAM) 50 MG tablet Take 1 tablet by mouth every 4 (four) hours as needed. pain 11/07/14   [provider]    Family History History reviewed. No pertinent family history.  Social History Social History   Tobacco Use  . Smoking status: Never Smoker  . Smokeless tobacco: Never Used  Substance Use Topics  . Alcohol use: No  . Drug use: No     Allergies   Contrast media [iodinated diagnostic  agents]   Review of Systems Review of Systems  All systems reviewed and negative, other than as noted in HPI.  Physical Exam Updated Vital Signs BP (!) 193/85 (BP Location: Left Arm)   Pulse 60   Temp 98.5 F (36.9 C) (Oral)   Resp 16   Ht 5\' 4"  (1.626 m)   Wt 78.5 kg   SpO2 98%   BMI 29.70 kg/m   Physical Exam  Constitutional: She is oriented to person, place, and time. She appears well-developed and well-nourished. No distress.  HENT:  Head: Normocephalic and atraumatic.  Eyes: Conjunctivae are normal. Right eye exhibits no discharge. Left eye exhibits no discharge.  Neck: Neck supple.  Cardiovascular: Normal rate, regular rhythm and normal heart sounds. Exam reveals no gallop and no friction rub.  No murmur heard. Pulmonary/Chest: Effort normal and breath sounds normal. No respiratory distress.  Abdominal: Soft. She exhibits no distension. There is no tenderness.  Musculoskeletal: She exhibits no edema or tenderness.  Neurological: She is alert and oriented to person, place, and time. No cranial nerve deficit. She exhibits normal muscle tone. Coordination normal.  Skin: Skin is warm and dry.  Psychiatric: She has a normal mood and affect. Her behavior is normal. Thought content normal.  Nursing note and vitals reviewed.    ED Treatments / Results  Labs (all labs ordered are  listed, but only abnormal results are displayed) Labs Reviewed - No data to display  EKG None  Radiology Ct Head Wo Contrast  Result Date: 04/20/2018 CLINICAL DATA:  Acute severe headache persisting for 2 days, hypertension EXAM: CT HEAD WITHOUT CONTRAST TECHNIQUE: Contiguous axial images were obtained from the base of the skull through the vertex without intravenous contrast. COMPARISON:  06/30/2013 FINDINGS: Brain: Stable mild atrophy pattern and chronic white matter microvascular ischemic changes throughout both cerebral hemispheres. No acute intracranial hemorrhage, definite new infarction,  mass lesion, midline shift, herniation, hydrocephalus, or extra-axial fluid collection. No focal mass effect or edema. Cisterns are patent. No cerebellar abnormality. Vascular: No hyperdense vessel or unexpected calcification. Skull: Normal. Negative for fracture or focal lesion. Sinuses/Orbits: No acute finding. Other: None. IMPRESSION: Stable atrophy and chronic white matter microvascular ischemic changes. No interval change or acute process by noncontrast CT. Electronically Signed   By: Judie Petit.  Shick M.D.   On: 04/20/2018 18:48    Procedures Procedures (including critical care time)  Medications Ordered in ED Medications - No data to display   Initial Impression / Assessment and Plan / ED Course  I have reviewed the triage vital signs and the nursing notes.  Pertinent labs & imaging results that were available during my care of the patient were reviewed by me and considered in my medical decision making (see chart for details).     84 year old female with headache.  Nonfocal neurologic examination.  CT done given her advanced age no significant headache history.  Is negative for acute process.  I doubt emergent etiology of her headache such as bleed, mass, acute angle-closure glaucoma or other.  She declined pain medicine here.  Advised to take Tylenol as needed for returns.  Emergent return precautions were discussed.  Outpatient follow-up otherwise. Final Clinical Impressions(s) / ED Diagnoses   Final diagnoses:  Acute nonintractable headache, unspecified headache type    ED Discharge Orders    None       Raeford Razor, MD 04/20/18 1902

## 2018-04-20 NOTE — ED Triage Notes (Signed)
C/o headache  x 2 days, seen by UC yesterday for same

## 2018-04-20 NOTE — ED Notes (Signed)
Patient transported to CT 

## 2018-04-20 NOTE — ED Notes (Signed)
Pt encouraged again to contact PCP in order to manage her BP and her DM since she stopped taking her medication three weeks ago. Denies any further needs at this time.

## 2018-08-20 DIAGNOSIS — F419 Anxiety disorder, unspecified: Secondary | ICD-10-CM | POA: Diagnosis not present

## 2018-08-20 DIAGNOSIS — Z5181 Encounter for therapeutic drug level monitoring: Secondary | ICD-10-CM | POA: Diagnosis not present

## 2018-08-20 DIAGNOSIS — R109 Unspecified abdominal pain: Secondary | ICD-10-CM | POA: Diagnosis not present

## 2018-08-20 DIAGNOSIS — E118 Type 2 diabetes mellitus with unspecified complications: Secondary | ICD-10-CM | POA: Diagnosis not present

## 2018-08-20 DIAGNOSIS — I10 Essential (primary) hypertension: Secondary | ICD-10-CM | POA: Diagnosis not present

## 2018-08-20 DIAGNOSIS — K7460 Unspecified cirrhosis of liver: Secondary | ICD-10-CM | POA: Diagnosis not present

## 2018-08-20 DIAGNOSIS — M545 Low back pain: Secondary | ICD-10-CM | POA: Diagnosis not present

## 2018-08-28 DIAGNOSIS — Z9114 Patient's other noncompliance with medication regimen: Secondary | ICD-10-CM | POA: Diagnosis not present

## 2018-08-28 DIAGNOSIS — I2510 Atherosclerotic heart disease of native coronary artery without angina pectoris: Secondary | ICD-10-CM | POA: Diagnosis not present

## 2018-08-28 DIAGNOSIS — F329 Major depressive disorder, single episode, unspecified: Secondary | ICD-10-CM | POA: Diagnosis not present

## 2018-08-28 DIAGNOSIS — Z7984 Long term (current) use of oral hypoglycemic drugs: Secondary | ICD-10-CM | POA: Diagnosis not present

## 2018-08-28 DIAGNOSIS — F330 Major depressive disorder, recurrent, mild: Secondary | ICD-10-CM | POA: Diagnosis not present

## 2018-08-28 DIAGNOSIS — K7460 Unspecified cirrhosis of liver: Secondary | ICD-10-CM | POA: Diagnosis not present

## 2018-08-28 DIAGNOSIS — F419 Anxiety disorder, unspecified: Secondary | ICD-10-CM | POA: Diagnosis not present

## 2018-08-28 DIAGNOSIS — I10 Essential (primary) hypertension: Secondary | ICD-10-CM | POA: Diagnosis not present

## 2018-08-28 DIAGNOSIS — E119 Type 2 diabetes mellitus without complications: Secondary | ICD-10-CM | POA: Diagnosis not present

## 2018-08-28 DIAGNOSIS — R600 Localized edema: Secondary | ICD-10-CM | POA: Diagnosis not present

## 2018-08-28 DIAGNOSIS — G8929 Other chronic pain: Secondary | ICD-10-CM | POA: Diagnosis not present

## 2018-08-28 DIAGNOSIS — M545 Low back pain: Secondary | ICD-10-CM | POA: Diagnosis not present

## 2018-09-01 DIAGNOSIS — E119 Type 2 diabetes mellitus without complications: Secondary | ICD-10-CM | POA: Diagnosis not present

## 2018-09-01 DIAGNOSIS — I2510 Atherosclerotic heart disease of native coronary artery without angina pectoris: Secondary | ICD-10-CM | POA: Diagnosis not present

## 2018-09-01 DIAGNOSIS — F329 Major depressive disorder, single episode, unspecified: Secondary | ICD-10-CM | POA: Diagnosis not present

## 2018-09-01 DIAGNOSIS — R600 Localized edema: Secondary | ICD-10-CM | POA: Diagnosis not present

## 2018-09-01 DIAGNOSIS — F419 Anxiety disorder, unspecified: Secondary | ICD-10-CM | POA: Diagnosis not present

## 2018-09-01 DIAGNOSIS — I10 Essential (primary) hypertension: Secondary | ICD-10-CM | POA: Diagnosis not present

## 2018-09-04 DIAGNOSIS — R600 Localized edema: Secondary | ICD-10-CM | POA: Diagnosis not present

## 2018-09-04 DIAGNOSIS — E119 Type 2 diabetes mellitus without complications: Secondary | ICD-10-CM | POA: Diagnosis not present

## 2018-09-04 DIAGNOSIS — F329 Major depressive disorder, single episode, unspecified: Secondary | ICD-10-CM | POA: Diagnosis not present

## 2018-09-04 DIAGNOSIS — I10 Essential (primary) hypertension: Secondary | ICD-10-CM | POA: Diagnosis not present

## 2018-09-04 DIAGNOSIS — F419 Anxiety disorder, unspecified: Secondary | ICD-10-CM | POA: Diagnosis not present

## 2018-09-04 DIAGNOSIS — I2510 Atherosclerotic heart disease of native coronary artery without angina pectoris: Secondary | ICD-10-CM | POA: Diagnosis not present

## 2018-09-24 DIAGNOSIS — I2510 Atherosclerotic heart disease of native coronary artery without angina pectoris: Secondary | ICD-10-CM | POA: Diagnosis not present

## 2018-09-24 DIAGNOSIS — F419 Anxiety disorder, unspecified: Secondary | ICD-10-CM | POA: Diagnosis not present

## 2018-09-24 DIAGNOSIS — I10 Essential (primary) hypertension: Secondary | ICD-10-CM | POA: Diagnosis not present

## 2018-09-24 DIAGNOSIS — R600 Localized edema: Secondary | ICD-10-CM | POA: Diagnosis not present

## 2018-09-24 DIAGNOSIS — E119 Type 2 diabetes mellitus without complications: Secondary | ICD-10-CM | POA: Diagnosis not present

## 2018-09-24 DIAGNOSIS — F329 Major depressive disorder, single episode, unspecified: Secondary | ICD-10-CM | POA: Diagnosis not present

## 2018-11-19 DIAGNOSIS — F419 Anxiety disorder, unspecified: Secondary | ICD-10-CM | POA: Diagnosis not present

## 2018-11-19 DIAGNOSIS — Z5181 Encounter for therapeutic drug level monitoring: Secondary | ICD-10-CM | POA: Diagnosis not present

## 2018-11-19 DIAGNOSIS — E118 Type 2 diabetes mellitus with unspecified complications: Secondary | ICD-10-CM | POA: Diagnosis not present

## 2018-11-19 DIAGNOSIS — R109 Unspecified abdominal pain: Secondary | ICD-10-CM | POA: Diagnosis not present

## 2018-11-19 DIAGNOSIS — R413 Other amnesia: Secondary | ICD-10-CM | POA: Diagnosis not present

## 2018-11-19 DIAGNOSIS — E7800 Pure hypercholesterolemia, unspecified: Secondary | ICD-10-CM | POA: Diagnosis not present

## 2018-11-19 DIAGNOSIS — R300 Dysuria: Secondary | ICD-10-CM | POA: Diagnosis not present

## 2018-11-19 DIAGNOSIS — Z0000 Encounter for general adult medical examination without abnormal findings: Secondary | ICD-10-CM | POA: Diagnosis not present

## 2018-11-19 DIAGNOSIS — Z780 Asymptomatic menopausal state: Secondary | ICD-10-CM | POA: Diagnosis not present

## 2018-11-19 DIAGNOSIS — R531 Weakness: Secondary | ICD-10-CM | POA: Diagnosis not present

## 2018-11-19 DIAGNOSIS — K219 Gastro-esophageal reflux disease without esophagitis: Secondary | ICD-10-CM | POA: Diagnosis not present

## 2018-11-19 DIAGNOSIS — I10 Essential (primary) hypertension: Secondary | ICD-10-CM | POA: Diagnosis not present

## 2018-11-19 DIAGNOSIS — M545 Low back pain: Secondary | ICD-10-CM | POA: Diagnosis not present

## 2019-02-18 DIAGNOSIS — K589 Irritable bowel syndrome without diarrhea: Secondary | ICD-10-CM | POA: Diagnosis not present

## 2019-02-18 DIAGNOSIS — K219 Gastro-esophageal reflux disease without esophagitis: Secondary | ICD-10-CM | POA: Diagnosis not present

## 2019-02-18 DIAGNOSIS — R634 Abnormal weight loss: Secondary | ICD-10-CM | POA: Diagnosis not present

## 2019-02-18 DIAGNOSIS — R531 Weakness: Secondary | ICD-10-CM | POA: Diagnosis not present

## 2019-02-18 DIAGNOSIS — Z780 Asymptomatic menopausal state: Secondary | ICD-10-CM | POA: Diagnosis not present

## 2019-02-18 DIAGNOSIS — E7800 Pure hypercholesterolemia, unspecified: Secondary | ICD-10-CM | POA: Diagnosis not present

## 2019-02-18 DIAGNOSIS — M545 Low back pain: Secondary | ICD-10-CM | POA: Diagnosis not present

## 2019-02-18 DIAGNOSIS — I10 Essential (primary) hypertension: Secondary | ICD-10-CM | POA: Diagnosis not present

## 2019-02-18 DIAGNOSIS — R413 Other amnesia: Secondary | ICD-10-CM | POA: Diagnosis not present

## 2019-02-18 DIAGNOSIS — F419 Anxiety disorder, unspecified: Secondary | ICD-10-CM | POA: Diagnosis not present

## 2019-02-18 DIAGNOSIS — R21 Rash and other nonspecific skin eruption: Secondary | ICD-10-CM | POA: Diagnosis not present

## 2019-02-18 DIAGNOSIS — E118 Type 2 diabetes mellitus with unspecified complications: Secondary | ICD-10-CM | POA: Diagnosis not present

## 2019-02-19 ENCOUNTER — Other Ambulatory Visit: Payer: Self-pay

## 2019-07-26 ENCOUNTER — Emergency Department (HOSPITAL_COMMUNITY): Payer: Medicare Other

## 2019-07-26 ENCOUNTER — Inpatient Hospital Stay (HOSPITAL_COMMUNITY)
Admission: EM | Admit: 2019-07-26 | Discharge: 2019-08-03 | DRG: 064 | Disposition: A | Discharge status: Skilled Nursing Facility | Payer: Medicare Other | Attending: Internal Medicine | Admitting: Internal Medicine

## 2019-07-26 DIAGNOSIS — R471 Dysarthria and anarthria: Secondary | ICD-10-CM | POA: Diagnosis not present

## 2019-07-26 DIAGNOSIS — Z7401 Bed confinement status: Secondary | ICD-10-CM | POA: Diagnosis not present

## 2019-07-26 DIAGNOSIS — R29810 Facial weakness: Secondary | ICD-10-CM | POA: Diagnosis present

## 2019-07-26 DIAGNOSIS — Z79899 Other long term (current) drug therapy: Secondary | ICD-10-CM | POA: Diagnosis not present

## 2019-07-26 DIAGNOSIS — I618 Other nontraumatic intracerebral hemorrhage: Secondary | ICD-10-CM | POA: Diagnosis not present

## 2019-07-26 DIAGNOSIS — I639 Cerebral infarction, unspecified: Secondary | ICD-10-CM | POA: Diagnosis not present

## 2019-07-26 DIAGNOSIS — E870 Hyperosmolality and hypernatremia: Secondary | ICD-10-CM | POA: Diagnosis not present

## 2019-07-26 DIAGNOSIS — R1310 Dysphagia, unspecified: Secondary | ICD-10-CM | POA: Diagnosis not present

## 2019-07-26 DIAGNOSIS — E785 Hyperlipidemia, unspecified: Secondary | ICD-10-CM | POA: Diagnosis present

## 2019-07-26 DIAGNOSIS — I160 Hypertensive urgency: Secondary | ICD-10-CM | POA: Diagnosis present

## 2019-07-26 DIAGNOSIS — Z7984 Long term (current) use of oral hypoglycemic drugs: Secondary | ICD-10-CM

## 2019-07-26 DIAGNOSIS — Z20822 Contact with and (suspected) exposure to covid-19: Secondary | ICD-10-CM | POA: Diagnosis not present

## 2019-07-26 DIAGNOSIS — R4781 Slurred speech: Secondary | ICD-10-CM | POA: Diagnosis present

## 2019-07-26 DIAGNOSIS — Z03818 Encounter for observation for suspected exposure to other biological agents ruled out: Secondary | ICD-10-CM | POA: Diagnosis not present

## 2019-07-26 DIAGNOSIS — Z91041 Radiographic dye allergy status: Secondary | ICD-10-CM

## 2019-07-26 DIAGNOSIS — G9349 Other encephalopathy: Secondary | ICD-10-CM | POA: Diagnosis not present

## 2019-07-26 DIAGNOSIS — E875 Hyperkalemia: Secondary | ICD-10-CM

## 2019-07-26 DIAGNOSIS — R279 Unspecified lack of coordination: Secondary | ICD-10-CM | POA: Diagnosis not present

## 2019-07-26 DIAGNOSIS — G8191 Hemiplegia, unspecified affecting right dominant side: Secondary | ICD-10-CM | POA: Diagnosis present

## 2019-07-26 DIAGNOSIS — M6281 Muscle weakness (generalized): Secondary | ICD-10-CM | POA: Diagnosis not present

## 2019-07-26 DIAGNOSIS — E871 Hypo-osmolality and hyponatremia: Secondary | ICD-10-CM | POA: Diagnosis not present

## 2019-07-26 DIAGNOSIS — I152 Hypertension secondary to endocrine disorders: Secondary | ICD-10-CM | POA: Diagnosis present

## 2019-07-26 DIAGNOSIS — M1990 Unspecified osteoarthritis, unspecified site: Secondary | ICD-10-CM | POA: Diagnosis present

## 2019-07-26 DIAGNOSIS — I209 Angina pectoris, unspecified: Secondary | ICD-10-CM | POA: Diagnosis not present

## 2019-07-26 DIAGNOSIS — I6389 Other cerebral infarction: Secondary | ICD-10-CM | POA: Diagnosis not present

## 2019-07-26 DIAGNOSIS — I629 Nontraumatic intracranial hemorrhage, unspecified: Secondary | ICD-10-CM | POA: Diagnosis not present

## 2019-07-26 DIAGNOSIS — I69320 Aphasia following cerebral infarction: Secondary | ICD-10-CM | POA: Diagnosis not present

## 2019-07-26 DIAGNOSIS — I615 Nontraumatic intracerebral hemorrhage, intraventricular: Secondary | ICD-10-CM | POA: Diagnosis present

## 2019-07-26 DIAGNOSIS — E1151 Type 2 diabetes mellitus with diabetic peripheral angiopathy without gangrene: Secondary | ICD-10-CM | POA: Diagnosis present

## 2019-07-26 DIAGNOSIS — Z741 Need for assistance with personal care: Secondary | ICD-10-CM | POA: Diagnosis not present

## 2019-07-26 DIAGNOSIS — R4701 Aphasia: Secondary | ICD-10-CM | POA: Diagnosis present

## 2019-07-26 DIAGNOSIS — G936 Cerebral edema: Secondary | ICD-10-CM

## 2019-07-26 DIAGNOSIS — R1312 Dysphagia, oropharyngeal phase: Secondary | ICD-10-CM | POA: Diagnosis present

## 2019-07-26 DIAGNOSIS — I69351 Hemiplegia and hemiparesis following cerebral infarction affecting right dominant side: Secondary | ICD-10-CM | POA: Diagnosis not present

## 2019-07-26 DIAGNOSIS — I4510 Unspecified right bundle-branch block: Secondary | ICD-10-CM | POA: Diagnosis not present

## 2019-07-26 DIAGNOSIS — I610 Nontraumatic intracerebral hemorrhage in hemisphere, subcortical: Secondary | ICD-10-CM | POA: Diagnosis not present

## 2019-07-26 DIAGNOSIS — G935 Compression of brain: Secondary | ICD-10-CM

## 2019-07-26 DIAGNOSIS — E1165 Type 2 diabetes mellitus with hyperglycemia: Secondary | ICD-10-CM | POA: Diagnosis present

## 2019-07-26 DIAGNOSIS — E1159 Type 2 diabetes mellitus with other circulatory complications: Secondary | ICD-10-CM | POA: Diagnosis present

## 2019-07-26 DIAGNOSIS — I10 Essential (primary) hypertension: Secondary | ICD-10-CM | POA: Diagnosis present

## 2019-07-26 DIAGNOSIS — M2550 Pain in unspecified joint: Secondary | ICD-10-CM | POA: Diagnosis not present

## 2019-07-26 DIAGNOSIS — R0689 Other abnormalities of breathing: Secondary | ICD-10-CM | POA: Diagnosis not present

## 2019-07-26 DIAGNOSIS — R456 Violent behavior: Secondary | ICD-10-CM | POA: Diagnosis not present

## 2019-07-26 DIAGNOSIS — I2510 Atherosclerotic heart disease of native coronary artery without angina pectoris: Secondary | ICD-10-CM | POA: Diagnosis present

## 2019-07-26 LAB — I-STAT CHEM 8, ED
BUN: 15 mg/dL (ref 8–23)
Calcium, Ion: 1.02 mmol/L — ABNORMAL LOW (ref 1.15–1.40)
Chloride: 100 mmol/L (ref 98–111)
Creatinine, Ser: 0.6 mg/dL (ref 0.44–1.00)
Glucose, Bld: 227 mg/dL — ABNORMAL HIGH (ref 70–99)
HCT: 48 % — ABNORMAL HIGH (ref 36.0–46.0)
Hemoglobin: 16.3 g/dL — ABNORMAL HIGH (ref 12.0–15.0)
Potassium: 7.9 mmol/L (ref 3.5–5.1)
Sodium: 132 mmol/L — ABNORMAL LOW (ref 135–145)
TCO2: 30 mmol/L (ref 22–32)

## 2019-07-26 LAB — COMPREHENSIVE METABOLIC PANEL
ALT: 19 U/L (ref 0–44)
AST: 41 U/L (ref 15–41)
Albumin: 3.2 g/dL — ABNORMAL LOW (ref 3.5–5.0)
Alkaline Phosphatase: 70 U/L (ref 38–126)
Anion gap: 14 (ref 5–15)
BUN: 11 mg/dL (ref 8–23)
CO2: 18 mmol/L — ABNORMAL LOW (ref 22–32)
Calcium: 8.9 mg/dL (ref 8.9–10.3)
Chloride: 102 mmol/L (ref 98–111)
Creatinine, Ser: 0.73 mg/dL (ref 0.44–1.00)
GFR calc Af Amer: >60 mL/min (ref >60)
GFR calc non Af Amer: >60 mL/min (ref >60)
Glucose, Bld: 262 mg/dL — ABNORMAL HIGH (ref 70–99)
Potassium: 4.8 mmol/L (ref 3.5–5.1)
Sodium: 134 mmol/L — ABNORMAL LOW (ref 135–145)
Total Bilirubin: 0.9 mg/dL (ref 0.3–1.2)
Total Protein: 6.6 g/dL (ref 6.5–8.1)

## 2019-07-26 LAB — DIFFERENTIAL
Abs Immature Granulocytes: 0.03 10*3/uL (ref 0.00–0.07)
Basophils Absolute: 0 10*3/uL (ref 0.0–0.1)
Basophils Relative: 0 %
Eosinophils Absolute: 0 10*3/uL (ref 0.0–0.5)
Eosinophils Relative: 0 %
Immature Granulocytes: 0 %
Lymphocytes Relative: 9 %
Lymphs Abs: 0.6 10*3/uL — ABNORMAL LOW (ref 0.7–4.0)
Monocytes Absolute: 0.2 10*3/uL (ref 0.1–1.0)
Monocytes Relative: 2 %
Neutro Abs: 6.1 10*3/uL (ref 1.7–7.7)
Neutrophils Relative %: 89 %

## 2019-07-26 LAB — APTT: aPTT: 29 seconds (ref 24–36)

## 2019-07-26 LAB — CBC
HCT: 45.8 % (ref 36.0–46.0)
Hemoglobin: 15.5 g/dL — ABNORMAL HIGH (ref 12.0–15.0)
MCH: 30.9 pg (ref 26.0–34.0)
MCHC: 33.8 g/dL (ref 30.0–36.0)
MCV: 91.4 fL (ref 80.0–100.0)
Platelets: 208 10*3/uL (ref 150–400)
RBC: 5.01 MIL/uL (ref 3.87–5.11)
RDW: 13.2 % (ref 11.5–15.5)
WBC: 6.9 10*3/uL (ref 4.0–10.5)
nRBC: 0 % (ref 0.0–0.2)

## 2019-07-26 LAB — CBG MONITORING, ED
Glucose-Capillary: 220 mg/dL — ABNORMAL HIGH (ref 70–99)
Glucose-Capillary: 237 mg/dL — ABNORMAL HIGH (ref 70–99)

## 2019-07-26 LAB — PROTIME-INR
INR: 1.1 (ref 0.8–1.2)
Prothrombin Time: 13.9 seconds (ref 11.4–15.2)

## 2019-07-26 MED ORDER — SENNOSIDES-DOCUSATE SODIUM 8.6-50 MG PO TABS
1.0000 | ORAL_TABLET | Freq: Two times a day (BID) | ORAL | Status: DC
  Administered 2019-07-28 – 2019-08-02 (×10): 1 via ORAL
  Filled 2019-07-26 (×13): qty 1

## 2019-07-26 MED ORDER — ACETAMINOPHEN 160 MG/5ML PO SOLN: 650.0000 mg | ORAL | Status: DC | PRN

## 2019-07-26 MED ORDER — PANTOPRAZOLE SODIUM 40 MG IV SOLR
40.0000 mg | Freq: Every day | INTRAVENOUS | Status: DC
  Administered 2019-07-26 – 2019-07-28 (×2): 40 mg via INTRAVENOUS
  Filled 2019-07-26 (×3): qty 40

## 2019-07-26 MED ORDER — SODIUM CHLORIDE 0.9% FLUSH
3.0000 mL | Freq: Once | INTRAVENOUS | Status: AC
  Administered 2019-07-26: 22:00:00 3 mL via INTRAVENOUS

## 2019-07-26 MED ORDER — ACETAMINOPHEN 325 MG PO TABS
650.0000 mg | ORAL_TABLET | ORAL | Status: DC | PRN
  Administered 2019-07-28 – 2019-07-31 (×3): 650 mg via ORAL
  Filled 2019-07-26 (×3): qty 2

## 2019-07-26 MED ORDER — ACETAMINOPHEN 650 MG RE SUPP: 650.0000 mg | RECTAL | Status: DC | PRN

## 2019-07-26 MED ORDER — INSULIN ASPART 100 UNIT/ML ~~LOC~~ SOLN
0.0000 [IU] | SUBCUTANEOUS | Status: DC
  Administered 2019-07-26: 5 [IU] via SUBCUTANEOUS
  Administered 2019-07-27: 03:00:00 3 [IU] via SUBCUTANEOUS
  Administered 2019-07-27: 20:00:00 2 [IU] via SUBCUTANEOUS
  Administered 2019-07-28: 3 [IU] via SUBCUTANEOUS
  Administered 2019-07-28 (×2): 2 [IU] via SUBCUTANEOUS
  Administered 2019-07-28 – 2019-07-29 (×2): 3 [IU] via SUBCUTANEOUS
  Administered 2019-07-29: 09:00:00 2 [IU] via SUBCUTANEOUS
  Administered 2019-07-29: 17:00:00 8 [IU] via SUBCUTANEOUS
  Administered 2019-07-30 (×3): 3 [IU] via SUBCUTANEOUS

## 2019-07-26 MED ORDER — CLEVIDIPINE BUTYRATE 0.5 MG/ML IV EMUL
1.0000 mg/h | INTRAVENOUS | Status: DC
  Administered 2019-07-26: 1 mg/h via INTRAVENOUS
  Administered 2019-07-27: 8 mg/h via INTRAVENOUS
  Filled 2019-07-26: qty 50

## 2019-07-26 MED ORDER — LABETALOL HCL 5 MG/ML IV SOLN
INTRAVENOUS | Status: AC
  Administered 2019-07-26: 21:00:00 10 mg via INTRAVENOUS
  Filled 2019-07-26: qty 4

## 2019-07-26 MED ORDER — STROKE: EARLY STAGES OF RECOVERY BOOK
Freq: Once | Status: DC
  Filled 2019-07-26: qty 1

## 2019-07-26 NOTE — ED Triage Notes (Signed)
An 86 yr old patient came in for code stroke, LKW at 12:15pm, found aphasic with slurring of speech, right sided neglect. Initial BP of 200/99 had labetalol 10 mg at 20:49. CBG of 240.

## 2019-07-26 NOTE — H&P (Addendum)
Neurology H&P  CC: Right-sided weakness and aphasia  History is obtained from: Family  HPI: [name] [name] is a 86 y.o. female with a history of diabetes, hypertension, coronary artery disease who presents with right-sided weakness that was discovered around 7 PM.  Family last saw her at 2, they are staying at a hotel, and left for work about that time.  At 730, they returned to find her incapacitated.  Apparently someone had knocked on the door about 3 and had not gotten an answer.   LKW: 12:30 PM tpa given?: No, ICH IR Thrombectomy? No, ICH ICH score: 3(age, IVH, GCS of 12) Modified Rankin Scale: 0-Completely asymptomatic and back to baseline post- stroke   ROS: A complete ROS was performed and is negative except as noted in the HPI.   Past Medical History:  Diagnosis Date  . Arthritis   . Coronary artery disease    angina  . Diabetes mellitus without complication ([place])   . Hypertension    Family history: Unable to obtain due to altered mental status.   Social History:  reports that she has never smoked. She has never used smokeless tobacco. She reports that she does not drink alcohol or use drugs.   Prior to Admission medications   Medication Sig Start Date End Date Taking? Authorizing Provider  enalapril (VASOTEC) 10 MG tablet Take 1 tablet (10 mg total) by mouth daily. 11/23/14   Virgel Manifold, MD  metFORMIN (GLUCOPHAGE) 500 MG tablet Take 1 tablet by mouth 2 (two) times daily. 11/07/14   [provider]  oxyCODONE-acetaminophen (PERCOCET/ROXICET) 5-325 MG per tablet Take 1-2 tablets by mouth every 4 (four) hours as needed. 11/23/14   Virgel Manifold, MD  traMADol (ULTRAM) 50 MG tablet Take 1 tablet by mouth every 4 (four) hours as needed. pain 11/07/14   [provider]     Exam: Current vital signs: BP (!) 212/99 (BP Location: Right Arm)   Pulse 95   Temp 99.5 F (37.5 C) (Axillary)   Resp 20   SpO2 98%    Physical Exam  Constitutional:  Appears well-developed and well-nourished.  Psych: Affect appropriate to situation Eyes: No scleral injection HENT: No OP obstrucion Head: Normocephalic.  Cardiovascular: Normal rate and regular rhythm.  Respiratory: Effort normal and breath sounds normal to anterior ascultation GI: Soft.  No distension. There is no tenderness.  Skin: WDI  Neuro: Mental Status: Patient is awake, alert, aphasic, only occasionally follows commands.  Cranial Nerves: II: Does not blink from the right pupils are equal, round, and reactive to light.   III,IV, VI: EOMI without ptosis or diplopia.  V: Facial sensation is symmetric to temperature VII: Facial movement with right facial weakness VIII: hearing is intact to voice X: Uvula is midline and palate elevates symmetrically XI: Shoulder shrug is symmetric. XII: tongue is midline without atrophy or fasciculations.  Motor: Tone is normal. Bulk is normal.  She has severe right hemiparesis with 2/5 weakness of the right arm and leg Sensory: Responds less on the right than left Cerebellar: Does not perform   I have reviewed labs in epic and the pertinent results are: I-STAT suggests potassium of 7.9, I suspect this is artifact given that her creatinine is normal, no other clear reasons for hyperkalemia.  Her EKG does not show peaked T waves.  CMP is pending.    I have reviewed the images obtained: CT head-large thalamic hemorrhage on the left  Primary Diagnosis:  Nontraumatic intracerebral hemorrhage in hemisphere,  subcortical  Secondary Diagnosis: Cerebral edema Acute encephalopathy Hyperkalemia  Impression: 86 year old female with thalamic hemorrhage, likely hypertensive in etiology.  Discussed CODE STATUS with family, and for now she is full code.  She will need to be admitted for close monitoring and blood pressure control.  Plan: 1) Admit to ICU 2) no antiplatelets or anticoagulants 3) blood pressure control with goal systolic 120 -  140 4) Frequent neuro checks 5) If symptoms worsen or there is decreased mental status, repeat stat head CT 6) PT,OT,ST   This patient is critically ill and at significant risk of neurological worsening, death and care requires constant monitoring of vital signs, hemodynamics,respiratory and cardiac monitoring, neurological assessment, discussion with family, other specialists and medical decision making of high complexity. I spent 45 minutes of neurocritical care time  in the care of  this patient. This was time spent independent of any time provided by nurse practitioner or PA.  Ritta Slot, MD [facility] [phone]  If 7pm- 7am, please page neurology on call as listed in AMION.

## 2019-07-26 NOTE — ED Provider Notes (Signed)
[facility] EMERGENCY DEPARTMENT Provider Note   CSN: [id number] Arrival date & time: 07/26/19  2033     History Chief Complaint  Patient presents with  . Code Stroke    [name] [name] is a 86 y.o. female.  HPI [name] [name] is a 86 y.o. female with a medical history of cad, dm, htn who presents to the ED for stroke like symptoms. Last known well at 1230 pm 1/4 and was found at 7 pm to have right sided arm and leg weakness.  Family and patient were staying in a hotel, they had left the patient and came back at 7 PM.  At 730 she was altered and incapacitated.  No known recent trauma, fall or illness.  She does not take blood thinners.    Past Medical History:  Diagnosis Date  . Arthritis   . Coronary artery disease    angina  . Diabetes mellitus without complication (HCC)   . Hypertension     Patient Active Problem List   Diagnosis Date Noted  . ICH (intracerebral hemorrhage) (HCC) 07/26/2019    Past Surgical History:  Procedure Laterality Date  . APPENDECTOMY    . CHOLECYSTECTOMY       OB History   No obstetric history on file.     No family history on file.  Social History   Tobacco Use  . Smoking status: Never Smoker  . Smokeless tobacco: Never Used  Substance Use Topics  . Alcohol use: No  . Drug use: No    Home Medications Prior to Admission medications   Medication Sig Start Date End Date Taking? Authorizing Provider  betamethasone valerate (VALISONE) 0.1 % cream Apply 1 application topically 2 (two) times daily. 02/18/19   [provider]  clotrimazole (LOTRIMIN) 1 % cream Apply 1 application topically See admin instructions. Apply topically to toenails twice daily 02/22/19   [provider]  dicyclomine (BENTYL) 20 MG tablet Take 20 mg by mouth 2 (two) times daily as needed for spasms.  02/18/19   [provider]  enalapril (VASOTEC) 10 MG tablet Take 1 tablet (10 mg total) by mouth  daily. Patient not taking: Reported on 07/26/2019 11/23/14   Raeford Razor, MD  lisinopril (ZESTRIL) 10 MG tablet Take 10 mg by mouth daily. 07/12/19   [provider]  LORazepam (ATIVAN) 1 MG tablet Take 1 mg by mouth 2 (two) times daily as needed for anxiety.  07/20/19   [provider]  metFORMIN (GLUCOPHAGE) 500 MG tablet Take 1 tablet by mouth 2 (two) times daily. 11/07/14   [provider]  oxyCODONE-acetaminophen (PERCOCET/ROXICET) 5-325 MG per tablet Take 1-2 tablets by mouth every 4 (four) hours as needed. 11/23/14   Raeford Razor, MD  traMADol (ULTRAM) 50 MG tablet Take 1 tablet by mouth every 4 (four) hours as needed (pain).  11/07/14   [provider]    Allergies    Contrast media [iodinated diagnostic agents]  Review of Systems   Review of Systems  Unable to perform ROS: Mental status change  Neurological: Positive for facial asymmetry, speech difficulty and weakness (Right-sided arm and leg weakness).  Psychiatric/Behavioral: Positive for confusion.    Physical Exam Updated Vital Signs BP (!) 129/57   Pulse 80   Temp 99.5 F (37.5 C) (Axillary)   Resp 17   SpO2 98%   Physical Exam Vitals and nursing note reviewed.  Constitutional:      General: She is in acute distress.  Appearance: She is well-developed and normal weight. She is ill-appearing. She is not toxic-appearing.     Comments: She is alert, confused, incomprehensible words, GCS 12, follows some commands  HENT:     Head: Normocephalic and atraumatic.     Right Ear: External ear normal.     Left Ear: External ear normal.     Nose: Nose normal. No rhinorrhea.     Mouth/Throat:     Mouth: Mucous membranes are moist.  Eyes:     General:        Right eye: No discharge.        Left eye: No discharge.     Conjunctiva/sclera: Conjunctivae normal.  Cardiovascular:     Rate and Rhythm: Normal rate and regular rhythm.     Pulses: Normal pulses.     Heart sounds: Normal  heart sounds. No murmur.  Pulmonary:     Effort: Pulmonary effort is normal. No respiratory distress.     Breath sounds: Normal breath sounds. No wheezing or rales.  Abdominal:     General: Abdomen is flat. There is no distension.     Palpations: Abdomen is soft.     Tenderness: There is no abdominal tenderness.  Musculoskeletal:        General: No deformity or signs of injury. Normal range of motion.     Cervical back: Normal range of motion and neck supple.  Skin:    General: Skin is warm and dry.     Capillary Refill: Capillary refill takes less than 2 seconds.     Coloration: Skin is not jaundiced.  Neurological:     Mental Status: She is alert. She is disoriented.     Cranial Nerves: Cranial nerve deficit present.     Sensory: Sensory deficit present.     Motor: Weakness present.     Comments: Awake, alert, and disoriented Gait testing deferred Finger to nose unable to test Sensation less on right than left Strength 2/5 in RUE and RLE Strength 5/5 LUE and LLE  CRANIAL NERVES: 2 (Optic)-PERRLA. VF intact to confrontation. 3/4/6 (Oculomotor, Trochlear, Abudcens)- EOMI 5 (Trigeminal)-Sensation intact topinprick 7 (Facial)-Asymmetric 8 (Vestibulococchlear)-Responds to voice 9/10 (Glossopharyngeal)-Symmetric palate and uvula elevation 11 (Spinal accessory)-Head midline 12 (Hypoglossal)-Tongue Midline   Psychiatric:        Mood and Affect: Mood normal.        Behavior: Behavior normal.     ED Results / Procedures / Treatments   Labs (all labs ordered are listed, but only abnormal results are displayed) Labs Reviewed  CBC - Abnormal; Notable for the following components:      Result Value   Hemoglobin 15.5 (*)    All other components within normal limits  DIFFERENTIAL - Abnormal; Notable for the following components:   Lymphs Abs 0.6 (*)    All other components within normal limits  COMPREHENSIVE METABOLIC PANEL - Abnormal; Notable for the following  components:   Sodium 134 (*)    CO2 18 (*)    Glucose, Bld 262 (*)    Albumin 3.2 (*)    All other components within normal limits  I-STAT CHEM 8, ED - Abnormal; Notable for the following components:   Sodium 132 (*)    Potassium 7.9 (*)    Glucose, Bld 227 (*)    Calcium, Ion 1.02 (*)    Hemoglobin 16.3 (*)    HCT 48.0 (*)    All other components within normal limits  CBG MONITORING, ED - Abnormal;  Notable for the following components:   Glucose-Capillary 220 (*)    All other components within normal limits  CBG MONITORING, ED - Abnormal; Notable for the following components:   Glucose-Capillary 237 (*)    All other components within normal limits  PROTIME-INR  APTT  HEMOGLOBIN A1C    EKG EKG Interpretation  Date/Time:  Monday July 26 2019 20:58:01 EST Ventricular Rate:  84 PR Interval:    QRS Duration: 156 QT Interval:  441 QTC Calculation: 522 R Axis:   74 Text Interpretation: Sinus rhythm Right bundle branch block Probable inferior infarct, recent No previous ECGs available Confirmed by Alvira Monday ([id number]) on 07/26/2019 9:55:34 PM   Radiology CT HEAD CODE STROKE WO CONTRAST  Result Date: 07/26/2019 CLINICAL DATA:  Code stroke.  Right arm weakness and aphasia. EXAM: CT HEAD WITHOUT CONTRAST TECHNIQUE: Contiguous axial images were obtained from the base of the skull through the vertex without intravenous contrast. COMPARISON:  04/20/2018 FINDINGS: Brain: An acute hemorrhage centered in the left thalamus measures 3.2 x 4.0 x 2.6 cm (estimated volume of 17 mL) with mild surrounding edema and minimal localized rightward midline shift. There is intraventricular extension with a moderate volume of hemorrhage in the left lateral ventricle and small volume of hemorrhage in the other ventricles. There is no ventriculomegaly. No definite acute infarct is identified separate from the left thalamic hemorrhage. Hypodensities in the cerebral white matter bilaterally are unchanged  and nonspecific but compatible with mild chronic small vessel ischemic disease. Small chronic infarcts are suspected in the inferior left and possibly right cerebellar hemispheres. There is no extra-axial fluid collection. Vascular: Calcified atherosclerosis at the skull base. No hyperdense vessel. Skull: No fracture suspicious osseous lesion. Sinuses/Orbits: Small right maxillary sinus. No acute inflammatory changes. Unremarkable orbits. Other: None. ASPECTS [facility] Stroke Program Early CT Score) Not scored due to the presence of hemorrhage. IMPRESSION: 1. 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 2. Mild chronic small vessel ischemic disease. Critical Value/emergent results were called by telephone at the time of interpretation on 07/26/2019 at 8:49 pm to Dr. Ritta Slot, who verbally acknowledged these results. Electronically Signed   By: Sebastian Ache M.D.   On: 07/26/2019 20:56    Procedures Procedures (including critical care time)  Medications Ordered in ED Medications   stroke: mapping our early stages of recovery book (has no administration in time range)  acetaminophen (TYLENOL) tablet 650 mg (has no administration in time range)    Or  acetaminophen (TYLENOL) 160 MG/5ML solution 650 mg (has no administration in time range)    Or  acetaminophen (TYLENOL) suppository 650 mg (has no administration in time range)  senna-docusate (Senokot-S) tablet 1 tablet (1 tablet Oral Not Given 07/26/19 2214)  pantoprazole (PROTONIX) injection 40 mg (40 mg Intravenous Given 07/26/19 2222)  insulin aspart (novoLOG) injection 0-15 Units (5 Units Subcutaneous Given 07/26/19 2358)  clevidipine (CLEVIPREX) infusion 0.5 mg/mL (9 mg/hr Intravenous New Bag/Given 07/27/19 0134)  sodium chloride flush (NS) 0.9 % injection 3 mL (3 mLs Intravenous Given 07/26/19 2143)  labetalol (NORMODYNE) 5 MG/ML injection (10 mg Intravenous Given 07/26/19 2122)    ED Course  I have reviewed the triage vital  signs and the nursing notes.  Pertinent labs & imaging results that were available during my care of the patient were reviewed by me and considered in my medical decision making (see chart for details).    MDM Rules/Calculators/A&P  ZIYAN HILLMER is a 86 y.o. female with a medical history of cad, dm, htn who presents to the ED for stroke like symptoms. Last known well at 1230 pm 1/4 and was found at 7 pm to have right sided arm and leg weakness.  Family and patient were staying in a hotel, they had left the patient and came back at 7 PM.  At 730 she was altered and incapacitated.  No known recent trauma, fall or illness.  She does not take blood thinners.  HPI and physical exam as above. She is alert, confused, incomprehensible words, GCS 12, follows some commands, hemodynamically stable, afebrile, non toxic.  Ct head significant for: 1. 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 2. Mild chronic small vessel ischemic disease She is not on blood thinners.  She is markedly hypertensive, starting cleviprex drip  I-STAT suggests potassium of 7.9, I suspect this is artifact given that her creatinine is normal, no other clear reasons for hyperkalemia.  Her EKG does not show peaked T waves.  CMP is pending.  Assessment: 86 year old female with atraumatic thalamic hemorrhage, likely hypertensive in etiology. She will need to be admitted to icu for close monitoring and blood pressure control.     Final Clinical Impression(s) / ED Diagnoses Final diagnoses:  Intracranial hemorrhage [facility])    Rx / DC Orders ED Discharge Orders    None       Nelissa Bolduc, Ladona Ridgel, MD 07/27/19 [id number]    Alvira Monday, MD 07/29/19 1429

## 2019-07-27 ENCOUNTER — Inpatient Hospital Stay (HOSPITAL_COMMUNITY): Payer: Medicare Other

## 2019-07-27 DIAGNOSIS — I610 Nontraumatic intracerebral hemorrhage in hemisphere, subcortical: Secondary | ICD-10-CM

## 2019-07-27 DIAGNOSIS — I6389 Other cerebral infarction: Secondary | ICD-10-CM

## 2019-07-27 DIAGNOSIS — G935 Compression of brain: Secondary | ICD-10-CM

## 2019-07-27 DIAGNOSIS — G936 Cerebral edema: Secondary | ICD-10-CM

## 2019-07-27 LAB — GLUCOSE, CAPILLARY
Glucose-Capillary: 111 mg/dL — ABNORMAL HIGH (ref 70–99)
Glucose-Capillary: 128 mg/dL — ABNORMAL HIGH (ref 70–99)
Glucose-Capillary: 132 mg/dL — ABNORMAL HIGH (ref 70–99)
Glucose-Capillary: 138 mg/dL — ABNORMAL HIGH (ref 70–99)

## 2019-07-27 LAB — MRSA PCR SCREENING: MRSA by PCR: NEGATIVE

## 2019-07-27 LAB — LIPID PANEL
Cholesterol: 205 mg/dL — ABNORMAL HIGH (ref 0–200)
HDL: 53 mg/dL (ref >40)
LDL Cholesterol: 120 mg/dL — ABNORMAL HIGH (ref 0–99)
Total CHOL/HDL Ratio: 3.9 RATIO
Triglycerides: 161 mg/dL — ABNORMAL HIGH (ref <150)
VLDL: 32 mg/dL (ref 0–40)

## 2019-07-27 LAB — SARS CORONAVIRUS 2 (TAT 6-24 HRS): SARS Coronavirus 2: NEGATIVE

## 2019-07-27 LAB — ECHOCARDIOGRAM COMPLETE

## 2019-07-27 LAB — CBG MONITORING, ED: Glucose-Capillary: 179 mg/dL — ABNORMAL HIGH (ref 70–99)

## 2019-07-27 MED ORDER — CLEVIDIPINE BUTYRATE 0.5 MG/ML IV EMUL
0.0000 mg/h | INTRAVENOUS | Status: DC
  Administered 2019-07-27: 03:00:00 9 mg/h via INTRAVENOUS
  Administered 2019-07-27: 8 mg/h via INTRAVENOUS
  Administered 2019-07-27 (×2): 10 mg/h via INTRAVENOUS
  Administered 2019-07-27: 02:00:00 9 mg/h via INTRAVENOUS
  Administered 2019-07-27: 10 mg/h via INTRAVENOUS
  Administered 2019-07-27: 06:00:00 9 mg/h via INTRAVENOUS
  Administered 2019-07-28: 06:00:00 7 mg/h via INTRAVENOUS
  Administered 2019-07-28: 3 mg/h via INTRAVENOUS
  Administered 2019-07-28: 10 mg/h via INTRAVENOUS
  Filled 2019-07-27 (×4): qty 50
  Filled 2019-07-27 (×2): qty 100
  Filled 2019-07-27 (×4): qty 50

## 2019-07-27 MED ORDER — CHLORHEXIDINE GLUCONATE 0.12 % MT SOLN
15.0000 mL | Freq: Two times a day (BID) | OROMUCOSAL | Status: DC
  Administered 2019-07-27 – 2019-08-02 (×12): 15 mL via OROMUCOSAL
  Filled 2019-07-27 (×10): qty 15

## 2019-07-27 MED ORDER — ORAL CARE MOUTH RINSE
15.0000 mL | Freq: Two times a day (BID) | OROMUCOSAL | Status: DC
  Administered 2019-07-27 – 2019-08-02 (×11): 15 mL via OROMUCOSAL

## 2019-07-27 MED ORDER — LABETALOL HCL 5 MG/ML IV SOLN: 20.0000 mg | INTRAVENOUS | Status: DC | PRN

## 2019-07-27 MED ORDER — CHLORHEXIDINE GLUCONATE CLOTH 2 % EX PADS
6.0000 | MEDICATED_PAD | Freq: Every day | CUTANEOUS | Status: DC
  Administered 2019-07-27 – 2019-08-01 (×6): 6 via TOPICAL

## 2019-07-27 NOTE — Progress Notes (Signed)
Covid test still pending. Needs f/u imaging. Feel unsafe taking pt to MRI (flat in mask long period of time)... Discuss with Dr Pearlean Brownie, will send for CT instead.

## 2019-07-27 NOTE — Progress Notes (Signed)
  Echocardiogram 2D Echocardiogram has been performed.  Gerda Diss 07/27/2019, 3:25 PM

## 2019-07-27 NOTE — Evaluation (Signed)
Physical Therapy Evaluation Patient Details Name: [name] [name] MRN: [id number] DOB: 1933/03/29 Today's Date: 07/27/2019   History of Present Illness  86 y.o. female with a history of diabetes, hypertension, coronary artery disease who presented 07/26/19 with right-sided weakness and BP 200/99; CT head large thalamic hemorrhage on the left; repeat head CT 1/5 showed thalamic hemorrhage stable in size, with increased surrounding edema, including 5 mm midline shift  Clinical Impression   Pt admitted with above diagnosis. Patient with right hemiparesis and incr tone/tightness. Patient noted to have small volitional movements of rt extremities. In sitting, she has contralateral pushing (towards her right) requiring mod assist to maintain upright sitting. Patient with expressive language deficits and cannot provide history and home set-up.  Pt currently with functional limitations due to the deficits listed below (see PT Problem List). Pt will benefit from skilled PT to increase their independence and safety with mobility to allow discharge to the venue listed below.       Follow Up Recommendations CIR;Supervision/Assistance - 24 hour    Equipment Recommendations  Other (comment)(TBD next venue)    Recommendations for Other Services Rehab consult;OT consult;Speech consult     Precautions / Restrictions Precautions Precautions: Fall      Mobility  Bed Mobility Overal bed mobility: Needs Assistance Bed Mobility: Rolling;Sidelying to Sit;Sit to Sidelying Rolling: Max assist(to her rt) Sidelying to sit: Max assist     Sit to sidelying: Max assist General bed mobility comments: pt able to assist with supine scoot to her left (to sit at Lt EOB); once rolling initiated and she could reach rail with her LUE, she was able to assist to turn onto her side; assisted with mvoing legs off EOB  Transfers                 General transfer comment: unsafe to attempt due to contralateral  pushing  Ambulation/Gait                Stairs            Wheelchair Mobility    Modified Rankin (Stroke Patients Only) Modified Rankin (Stroke Patients Only) Pre-Morbid Rankin Score: No symptoms(presumed, pt cannot state) Modified Rankin: Severe disability     Balance Overall balance assessment: Needs assistance Sitting-balance support: Feet unsupported;Single extremity supported Sitting balance-Leahy Scale: Zero Sitting balance - Comments: initially very strong contralateral push (rt pushing to her left side) with left lateral lean; progressed with wt-shifging and reaching activities to brief periods of min assist to maintain midline Postural control: Right lateral lean                                   Pertinent Vitals/Pain Pain Assessment: Faces Faces Pain Scale: No hurt    Home Living Family/patient expects to be discharged to:: Unsure                 Additional Comments: pt unable to provide information    Prior Function           Comments: Unknown; per chart is widowed and ? lives alone     Hand Dominance        Extremity/Trunk Assessment   Upper Extremity Assessment Upper Extremity Assessment: Defer to OT evaluation(observed incr tone into IR, flexion at elbow)    Lower Extremity Assessment Lower Extremity Assessment: RLE deficits/detail;LLE deficits/detail;Difficult to assess due to impaired cognition RLE Deficits / Details: AAROM hip,  knee WFL; ankle PROM with knee extended lacks 30 degrees of DF; with knee bent ~15 degrees DF RLE Sensation: (appears impaired based on siting balance ) LLE Deficits / Details: AROM WFL (followed 1 step commands at hip and knee; ? not understanding concept of ankle pumps) LLE Sensation: (appears intact)    Cervical / Trunk Assessment Cervical / Trunk Assessment: Other exceptions Cervical / Trunk Exceptions: in sitting, pt strongly pushing with her LUE and leaning to her rt; required  min to mod assist to maintain upright posture  Communication   Communication: Expressive difficulties(?receptive issues; followed 1 step commands w/ incr time)  Cognition Arousal/Alertness: Awake/alert Behavior During Therapy: WFL for tasks assessed/performed Overall Cognitive Status: Difficult to assess                                        General Comments General comments (skin integrity, edema, etc.): Pt at times attending to RUE (pulling hand onto her torso). Noted pt has quickly developed tone in RLE and RUE    Exercises Other Exercises Other Exercises: PROM rt extremities (5-10 reps); AROM vs AAROM left extremities   Assessment/Plan    PT Assessment Patient needs continued PT services  PT Problem List Decreased strength;Decreased range of motion;Decreased activity tolerance;Decreased balance;Decreased mobility;Decreased knowledge of use of DME;Decreased safety awareness       PT Treatment Interventions DME instruction;Gait training;Functional mobility training;Therapeutic activities;Therapeutic exercise;Balance training;Neuromuscular re-education;Patient/family education    PT Goals (Current goals can be found in the Care Plan section)  Acute Rehab PT Goals Patient Stated Goal: unable to state PT Goal Formulation: Patient unable to participate in goal setting Time For Goal Achievement: 08/10/19 Potential to Achieve Goals: Fair    Frequency Min 4X/week   Barriers to discharge Other (comment) Unknown family support on discharge or home set-up (pt cannot provide)    Co-evaluation               AM-PAC PT "6 Clicks" Mobility  Outcome Measure Help needed turning from your back to your side while in a flat bed without using bedrails?: Total Help needed moving from lying on your back to sitting on the side of a flat bed without using bedrails?: Total Help needed moving to and from a bed to a chair (including a wheelchair)?: Total Help needed standing  up from a chair using your arms (e.g., wheelchair or bedside chair)?: Total Help needed to walk in hospital room?: Total Help needed climbing 3-5 steps with a railing? : Total 6 Click Score: 6    End of Session   Activity Tolerance: Treatment limited secondary to medical complications (Comment) Patient left: in bed;with call bell/phone within reach;with bed alarm set;with nursing/sitter in room Nurse Communication: Mobility status PT Visit Diagnosis: Hemiplegia and hemiparesis Hemiplegia - Right/Left: Right Hemiplegia - dominant/non-dominant: Dominant Hemiplegia - caused by: Nontraumatic SAH    Time: 1324-4010 PT Time Calculation (min) (ACUTE ONLY): 29 min   Charges:   PT Evaluation $PT Eval Moderate Complexity: 1 Mod PT Treatments $Neuromuscular Re-education: 8-22 mins         Arby Barrette, PT Pager [phone]   Rexanne Mano 07/27/2019, 4:23 PM

## 2019-07-27 NOTE — Progress Notes (Signed)
Call from MD saying bleed stable but there is edema. Pt awake and alert, attempting speech (unintelligable). MD says to contact him for possible 3% orders if pt status deteriorates. CT ordered for AM.

## 2019-07-27 NOTE — Evaluation (Signed)
Clinical/Bedside Swallow Evaluation Patient Details  Name: [name] [name] MRN: [id number] Date of Birth: [birth year]  Today's Date: 07/27/2019 Time: SLP Start Time (ACUTE ONLY): 0914 SLP Stop Time (ACUTE ONLY): 0927 SLP Time Calculation (min) (ACUTE ONLY): 13 min  Past Medical History:  Past Medical History:  Diagnosis Date  . Arthritis   . Coronary artery disease    angina  . Diabetes mellitus without complication (HCC)   . Hypertension    Past Surgical History:  Past Surgical History:  Procedure Laterality Date  . APPENDECTOMY    . CHOLECYSTECTOMY     HPI:  Pt is an 86 yo female presenting with R weakness, found to have large L thalamic hemorrhage. PMH: DM, HTN, CAD   Assessment / Plan / Recommendation Clinical Impression   Pt is drowsy, needing Min-Mod cues to maintain arousal to attempt PO trials. She follows commands inconsistently and does not swallow or cough volitionally. Coughing is noted with thin liquids, even in small volumes and with hand-over-hand assist for self-feeding. Signs of dysphagia with purees include multiple subswallows and intermittent throat clearing. Suspect that she has a neurologically-based oropharyngeal dysphagia with risk that is further exacerbated by her decreased level of alertness. Will f/u for potential to complete MBS and speech/language evaluation.    SLP Visit Diagnosis: Dysphagia, oropharyngeal phase (R13.12)    Aspiration Risk  Moderate aspiration risk    Diet Recommendation NPO   Medication Administration: Via alternative means    Other  Recommendations Oral Care Recommendations: Oral care QID   Follow up Recommendations Inpatient Rehab      Frequency and Duration min 2x/week  2 weeks       Prognosis Prognosis for Safe Diet Advancement: Good Barriers to Reach Goals: Cognitive deficits      Swallow Study   General HPI: Pt is an 86 yo female presenting with R weakness, found to have large L thalamic hemorrhage. PMH:  DM, HTN, CAD Type of Study: Bedside Swallow Evaluation Previous Swallow Assessment: none in chart Diet Prior to this Study: NPO Temperature Spikes Noted: No Respiratory Status: Room air History of Recent Intubation: No Behavior/Cognition: Cooperative;Lethargic/Drowsy;Requires cueing Oral Cavity Assessment: Within Functional Limits Oral Care Completed by SLP: Yes Oral Cavity - Dentition: Dentures, top;Missing dentition(no bottom dentition) Self-Feeding Abilities: Needs assist Patient Positioning: Upright in bed Baseline Vocal Quality: Normal(minimally produced) Volitional Cough: Cognitively unable to elicit Volitional Swallow: Unable to elicit    Oral/Motor/Sensory Function Overall Oral Motor/Sensory Function: (limited assessment.)   Ice Chips Ice chips: Impaired Presentation: Spoon Pharyngeal Phase Impairments: Cough - Immediate   Thin Liquid Thin Liquid: Impaired Presentation: Cup;Self Fed;Spoon Pharyngeal  Phase Impairments: Cough - Immediate    Nectar Thick Nectar Thick Liquid: Not tested   Honey Thick Honey Thick Liquid: Not tested   Puree Puree: Impaired Presentation: Spoon Pharyngeal Phase Impairments: Multiple swallows;Throat Clearing - Immediate   Solid     Solid: Not tested       Mahala Menghini., M.A. CCC-SLP Acute Rehabilitation Services Pager [phone] Office [phone]  07/27/2019,1:16 PM

## 2019-07-27 NOTE — Progress Notes (Signed)
STROKE TEAM PROGRESS NOTE   INTERVAL HISTORY I personally reviewed history of presenting illness, electronic medical records and imaging films in PACS. She was found to be aphasic and hemiplegic by family several hours after being last seen normal.  CT scan showed a left thalamic hemorrhage with intraventricular extension.  Patient has remained neurologically sleepy but stable.  Blood pressure adequately controlled.  Repeat CT scan of the head this morning shows stable size of the left thalamic hemorrhage with increasing surrounding edema and mass-effect with partial effacement of the left lateral ventricle and family mental left-to-right shift.  Stable intraventricular hemorrhage. Vitals:   07/27/19 0715 07/27/19 0730 07/27/19 0745 07/27/19 0800  BP: (!) 123/56 (!) 122/49 (!) 118/48 (!) 126/52  Pulse: 73 73 70 76  Resp: (!) 23 16 16  (!) 21  Temp:    99.7 F (37.6 C)  TempSrc:    Axillary  SpO2: 97% 96% 95% 97%    CBC:  Recent Labs  Lab 07/26/19 2045 07/26/19 2051  WBC 6.9  --   NEUTROABS 6.1  --   HGB 15.5* 16.3*  HCT 45.8 48.0*  MCV 91.4  --   PLT 208  --     Basic Metabolic Panel:  Recent Labs  Lab 07/26/19 2051 07/26/19 2141  NA 132* 134*  K 7.9* 4.8  CL 100 102  CO2  --  18*  GLUCOSE 227* 262*  BUN 15 11  CREATININE 0.60 0.73  CALCIUM  --  8.9   Lipid Panel: No results found for: CHOL, TRIG, HDL, CHOLHDL, VLDL, LDLCALC HgbA1c: No results found for: HGBA1C Urine Drug Screen:     Component Value Date/Time   LABOPIA POSITIVE 06/30/2013 1421   COCAINSCRNUR NEGATIVE 06/30/2013 1421   LABBENZ POSITIVE 06/30/2013 1421   AMPHETMU NEGATIVE 06/30/2013 1421   THCU NEGATIVE 06/30/2013 1421   LABBARB NEGATIVE 06/30/2013 1421    Alcohol Level No results found for: ETH  IMAGING past 48 hours CT HEAD CODE STROKE WO CONTRAST  Result Date: 07/26/2019 CLINICAL DATA:  Code stroke.  Right arm weakness and aphasia. EXAM: CT HEAD WITHOUT CONTRAST TECHNIQUE: Contiguous axial  images were obtained from the base of the skull through the vertex without intravenous contrast. COMPARISON:  04/20/2018 FINDINGS: Brain: An acute hemorrhage centered in the left thalamus measures 3.2 x 4.0 x 2.6 cm (estimated volume of 17 mL) with mild surrounding edema and minimal localized rightward midline shift. There is intraventricular extension with a moderate volume of hemorrhage in the left lateral ventricle and small volume of hemorrhage in the other ventricles. There is no ventriculomegaly. No definite acute infarct is identified separate from the left thalamic hemorrhage. Hypodensities in the cerebral white matter bilaterally are unchanged and nonspecific but compatible with mild chronic small vessel ischemic disease. Small chronic infarcts are suspected in the inferior left and possibly right cerebellar hemispheres. There is no extra-axial fluid collection. Vascular: Calcified atherosclerosis at the skull base. No hyperdense vessel. Skull: No fracture suspicious osseous lesion. Sinuses/Orbits: Small right maxillary sinus. No acute inflammatory changes. Unremarkable orbits. Other: None. ASPECTS [facility] Stroke Program Early CT Score) Not scored due to the presence of hemorrhage. IMPRESSION: 1. 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 2. Mild chronic small vessel ischemic disease. Critical Value/emergent results were called by telephone at the time of interpretation on 07/26/2019 at 8:49 pm to Dr. 09/23/2019, who verbally acknowledged these results. Electronically Signed   By: Ritta Slot M.D.   On: 07/26/2019 20:56  PHYSICAL EXAM Elderly African-American lady not in distress. . Afebrile. Head is nontraumatic. Neck is supple without bruit.    Cardiac exam no murmur or gallop. Lungs are clear to auscultation. Distal pulses are well felt. Neurological Exam :  Patient is drowsy but can be aroused with some difficulty.  She is aphasic and speaks only occasional words  and makes guttural sounds.  She follows only bear midline and occasional one-step commands on the right.  She has left gaze preference and not able to look to the right past midline.  She does not blink to threat on either side.  There is right lower facial weakness.  Tongue midline.  She has significant right hemiplegia and will withdraw her right leg more than arm to painful stimuli.  She has purposeful antigravity movements on the left.  Right plantars upgoing left is downgoing.  Gait not tested.  ASSESSMENT/PLAN Ms. ALENAH SARRIA is a 86 y.o. female with history of HTN, DB, CAD presenting with R sided weakness.   Stroke:   Hypertensive L thalamic ICH w/ IVH, cytotoxic edema and left to right midline shift brain herniation  Code Stroke CT head L thalamic ICH w/ IVH. Mild Small vessel disease.   MRI pending   MRA head pending  MRA neck not needed  2D Echo pending  LDL pending   HgbA1c pending   SCDs for VTE prophylaxis  No antithrombotic prior to admission, now on No antithrombotic given hmg  Therapy recommendations:  pending   Disposition:  pending   Ok to be OOB today with mild therapy  Hypertensive Urgency  SBP > 200s on arrival  On Cleviprex  Home meds:  Enalapril 10, lisinopril 10  Stable on gtt . SBP goal < 140 . Wean cleviprex . Prn labetalol  . Long-term BP goal normotensive  Diabetes type II   Home meds:  Metformin 500 bid  HgbA1c pending, goal < 7.0  CBGs Recent Labs    07/26/19 2355 07/27/19 0320 07/27/19 0821  GLUCAP 237* 179* 128*      SSI  Dysphagia . Secondary to stroke . NPO . Speech on board . Did not pass swallow . Grandson, POA, does not think she would want a FT  Other Stroke Risk Factors  Advanced age  Coronary artery disease  Other Active Problems  Hyponatremia 132->134  Hyperkalemia, resolved 74.9->4.8  Hospital day # 1  I have personally obtained history,examined this patient, reviewed notes,  independently viewed imaging studies, participated in medical decision making and plan of care.ROS completed by me personally and pertinent positives fully documented  I have made any additions or clarifications directly to the above note.  She has presented with a hypertensive left thalamic hemorrhage with intraventricular extension and cytotoxic edema and remains at risk for neurological worsening and development of hydrocephalus.  I had a long discussion over the phone with the patient's grandson who is her health power of attorney and updated her about her condition.  He feels grandmother would not have wanted prolonged ventilatory support and feeding tube and nursing home placement but wants to discuss with his mother and aunts and other family members and make a final decision and let me know. Continue strict blood pressure control with systolic below 140 for first 24 hours and then below 160.  Use IV labetalol and wean Cardene drip as tolerated.  Keep normothermic, euglycemic and euvolemic.  Check MRI scan of the brain and MRA of the brain later. No need for neurosurgical  intervention at the present time but may reconsider if needed.  Plan to repeat CT scan of the head tomorrow morning.  May need to consider starting hypertonic saline if neurological exam declines during the day.  Patient is full code for now but family may lean towards DNR after arrival and meeting with [place] This patient is critically ill and at significant risk of neurological worsening, death and care requires constant monitoring of vital signs, hemodynamics,respiratory and cardiac monitoring, extensive review of multiple databases, frequent neurological assessment, discussion with family, other specialists and medical decision making of high complexity.I have made any additions or clarifications directly to the above note.This critical care time does not reflect procedure time, or teaching time or supervisory time of PA/NP/Med Resident  etc but could involve care discussion time.  I spent 40 minutes of neurocritical care time  in the care of  this patient.     Antony Contras, MD Medical Director [facility] Pager: [phone] 07/27/2019 2:51 PM   To contact Stroke Continuity provider, please refer to [email or web address] After hours, contact General Neurology

## 2019-07-28 ENCOUNTER — Inpatient Hospital Stay (HOSPITAL_COMMUNITY): Payer: Medicare Other

## 2019-07-28 DIAGNOSIS — E1165 Type 2 diabetes mellitus with hyperglycemia: Secondary | ICD-10-CM

## 2019-07-28 DIAGNOSIS — G936 Cerebral edema: Secondary | ICD-10-CM

## 2019-07-28 DIAGNOSIS — E871 Hypo-osmolality and hyponatremia: Secondary | ICD-10-CM

## 2019-07-28 DIAGNOSIS — E875 Hyperkalemia: Secondary | ICD-10-CM

## 2019-07-28 DIAGNOSIS — I615 Nontraumatic intracerebral hemorrhage, intraventricular: Principal | ICD-10-CM

## 2019-07-28 LAB — GLUCOSE, CAPILLARY
Glucose-Capillary: 158 mg/dL — ABNORMAL HIGH (ref 70–99)
Glucose-Capillary: 99 mg/dL (ref 70–99)
Glucose-Capillary: 109 mg/dL — ABNORMAL HIGH (ref 70–99)
Glucose-Capillary: 187 mg/dL — ABNORMAL HIGH (ref 70–99)
Glucose-Capillary: 142 mg/dL — ABNORMAL HIGH (ref 70–99)

## 2019-07-28 LAB — HEMOGLOBIN A1C
Hgb A1c MFr Bld: 6.6 % — ABNORMAL HIGH (ref 4.8–5.6)
Mean Plasma Glucose: 143 mg/dL

## 2019-07-28 MED ORDER — ENOXAPARIN SODIUM 40 MG/0.4ML ~~LOC~~ SOLN
40.0000 mg | SUBCUTANEOUS | Status: DC
  Administered 2019-07-28 – 2019-08-02 (×6): 40 mg via SUBCUTANEOUS
  Filled 2019-07-28 (×7): qty 0.4

## 2019-07-28 MED ORDER — LISINOPRIL 10 MG PO TABS
10.0000 mg | ORAL_TABLET | Freq: Every day | ORAL | Status: DC
  Administered 2019-07-28 – 2019-08-01 (×5): 10 mg via ORAL
  Filled 2019-07-28 (×6): qty 1

## 2019-07-28 MED ORDER — LABETALOL HCL 5 MG/ML IV SOLN
20.0000 mg | INTRAVENOUS | Status: DC | PRN
  Administered 2019-07-29: 07:00:00 20 mg via INTRAVENOUS
  Filled 2019-07-28: qty 4

## 2019-07-28 MED ORDER — RESOURCE THICKENUP CLEAR PO POWD
ORAL | Status: DC | PRN
  Administered 2019-07-31: 17:00:00 1 via ORAL
  Filled 2019-07-28: qty 125

## 2019-07-28 NOTE — Progress Notes (Signed)
Rehab Admissions Coordinator Note:  Per PT recommendation, patient was screened by Stephania Fragmin for appropriateness for an Inpatient Acute Rehab Consult.  At this time, we are recommending Inpatient Rehab consult.  I will place an order so we can evaluate.   Stephania Fragmin 07/28/2019, 9:25 AM  I can be reached at [phone].

## 2019-07-28 NOTE — Progress Notes (Signed)
Modified Barium Swallow Progress Note  Patient Details  Name: [name] [name] MRN: [id number] Date of Birth: 03-06-1933  Today's Date: 07/28/2019  Modified Barium Swallow completed.  Full report located under Chart Review in the Imaging Section.  Brief recommendations include the following:  Clinical Impression  Pt has a moderate oropharyngeal dysphagia as a result of CN VII weakness and reduced coordination. She has trouble achieving full labial seal and oral clearance on her R side. Mild anterior spillage occurs and pocketing increases as boluses become more solid. She has trouble following commands with attempted strategies, but she did attempt a lingual sweep that cleared most of the buccal residue after repeated efforts. Pt is capable of achieveing good laryngeal vestibule closure and pharyngeal clearance, but aspiration occurs with thin and nectar thick liquids when she has trouble coordinating her swallow. At times this means a bolus spills back to her pharynx prematurely. Other times it is because she is trying to manage a subsequent bolus orally and hasn't swallowed to clear her pharynx yet. This also happened when she was trying to clear oral residue. Honey thick liquids and purees give allow her to properly close her airway before swallowing, even if her timing is off. Recommend starting Dys 1 diet and honey thick liquids. Full supervision is recommended given impulsive rate of intake and reduced awareness of pocketing. Will continue to follow acutely.   Swallow Evaluation Recommendations       SLP Diet Recommendations: Dysphagia 1 (Puree) solids;Honey thick liquids   Liquid Administration via: Cup;Straw   Medication Administration: Crushed with puree   Supervision: Staff to assist with self feeding;Full supervision/cueing for compensatory strategies   Compensations: Minimize environmental distractions;Slow rate;Small sips/bites;Lingual sweep for clearance of pocketing;Monitor  for anterior loss;Other (Comment)(one sip at a time)   Postural Changes: Remain semi-upright after after feeds/meals (Comment);Seated upright at 90 degrees   Oral Care Recommendations: Oral care BID   Other Recommendations: Order thickener from pharmacy;Prohibited food (jello, ice cream, thin soups);Remove water pitcher     Mahala Menghini., M.A. CCC-SLP Acute Rehabilitation Services Pager [phone] Office [phone]  07/28/2019,3:24 PM

## 2019-07-28 NOTE — Progress Notes (Signed)
  Speech Language Pathology Treatment: Dysphagia  Patient Details Name: [name] [name] MRN: [id number] DOB: [birth year] Today's Date: 07/28/2019 Time: 6701-1003 SLP Time Calculation (min) (ACUTE ONLY): 10 min  Assessment / Plan / Recommendation Clinical Impression  Pt is more alert and communicative today, although coughing with thin liquids persists. She has anterior spillage of purees, of which she does not seem to be aware despite cues from SLP. Per RN, cortrak was not placed on previous date as family did not think that the pt would want it. Will plan for MBS this afternoon to better assess oropharyngeal function and aid in decision making in terms of least restrictive diet.    HPI HPI: Pt is an 86 yo female presenting with R weakness, found to have large L thalamic hemorrhage. PMH: DM, HTN, CAD      SLP Plan  Continue with current plan of care       Recommendations  Diet recommendations: NPO Medication Administration: Crushed with puree                Oral Care Recommendations: Oral care QID Follow up Recommendations: Inpatient Rehab SLP Visit Diagnosis: Dysphagia, oropharyngeal phase (R13.12) Plan: Continue with current plan of care       GO                 Mahala Menghini., M.A. CCC-SLP Acute Rehabilitation Services Pager [phone] Office [phone]  07/28/2019, 10:01 AM

## 2019-07-28 NOTE — Evaluation (Signed)
Occupational Therapy Evaluation Patient Details Name: [name] [name] MRN: [id number] DOB: October 19, 1932 Today's Date: 07/28/2019    History of Present Illness 86 y.o. female with a history of diabetes, hypertension, coronary artery disease who presented 07/26/19 with right-sided weakness and BP 200/99; CT head large thalamic hemorrhage on the left; repeat head CT 1/5 showed thalamic hemorrhage stable in size, with increased surrounding edema, including 5 mm midline shift   Clinical Impression   Pt PTA: Pt unable to provide information. Pt currently limited by R hemiplgia, decreased ability to care for self, decreased expressive language, and decreased activity tolerance. Pt with maxA +2 required for bed mobility; transfers deferred due to pt's inability to assist with sitting upright.  RUE PROM initially rigid, but able to perform PROM, WFLs. Pt maxA totalA for ADL tasks. LUE with hand over hand assist. Pt requires continues OT skilled services for ADL, mobility and safety in CIR setting. OT following     Follow Up Recommendations  CIR    Equipment Recommendations  Other (comment)(to be determined)    Recommendations for Other Services Rehab consult     Precautions / Restrictions Precautions Precautions: Fall Restrictions Weight Bearing Restrictions: No      Mobility Bed Mobility Overal bed mobility: Needs Assistance Bed Mobility: Rolling;Sidelying to Sit;Sit to Sidelying Rolling: Max assist Sidelying to sit: Max assist;+2 for physical assistance     Sit to sidelying: Max assist;+2 for physical assistance General bed mobility comments: pt very rigid requiring maxA for LE mangement and trunk management  Transfers                 General transfer comment: unsafe, defer to hoyer lift    Balance Overall balance assessment: Needs assistance Sitting-balance support: Feet unsupported;Single extremity supported Sitting balance-Leahy Scale: Zero Sitting balance -  Comments: initially very strong contralateral push (rt pushing to her left side) with left lateral lean; progressed with wt-shifging and reaching activities to brief periods of min assist to maintain midline Postural control: Right lateral lean;Posterior lean                                 ADL either performed or assessed with clinical judgement   ADL Overall ADL's : Needs assistance/impaired Eating/Feeding: NPO   Grooming: Maximal assistance;Sitting;Bed level Grooming Details (indicate cue type and reason): hand over hand assist for washing face and combing through hair Upper Body Bathing: Maximal assistance;Total assistance;Sitting;Cueing for safety   Lower Body Bathing: Maximal assistance;Total assistance;+2 for physical assistance;+2 for safety/equipment;Cueing for safety;Sitting/lateral leans;Bed level   Upper Body Dressing : Maximal assistance;Cueing for safety;Sitting   Lower Body Dressing: Maximal assistance;Total assistance;+2 for physical assistance;+2 for safety/equipment;Sitting/lateral leans;Sit to/from stand;Cueing for safety   Toilet Transfer: Total assistance;+2 for physical assistance;+2 for safety/equipment   Toileting- Clothing Manipulation and Hygiene: Total assistance;+2 for physical assistance;+2 for safety/equipment;Sitting/lateral lean;Bed level       Functional mobility during ADLs: Maximal assistance;Total assistance;+2 for physical assistance;+2 for safety/equipment;Cueing for safety;Cueing for sequencing General ADL Comments: pt limited by R hemiplgia, decreased ability to care for self, decreased expressive language, and decreased activity tolerance     Vision   Vision Assessment?: Yes Eye Alignment: Within Functional Limits Ocular Range of Motion: Within Functional Limits Additional Comments: pt unable to focus fully for extensive eye exam; scanning R to L     Perception     Praxis      Pertinent Vitals/Pain Pain  Assessment:  Faces Faces Pain Scale: No hurt Pain Intervention(s): Repositioned     Hand Dominance     Extremity/Trunk Assessment Upper Extremity Assessment Upper Extremity Assessment: Generalized weakness RUE Deficits / Details: R hemiplegia, noted PROM, increased tone noted at rest. RUE Coordination: decreased fine motor;decreased gross motor   Lower Extremity Assessment Lower Extremity Assessment: Defer to PT evaluation;Generalized weakness RLE Deficits / Details: movement noted, increased tone in PROM of ankle   Cervical / Trunk Assessment Cervical / Trunk Assessment: Other exceptions Cervical / Trunk Exceptions: sitting; pushing to L and leaning to right; minA to modA for sitting upright   Communication Communication Communication: Expressive difficulties   Cognition Arousal/Alertness: Awake/alert Behavior During Therapy: Flat affect Overall Cognitive Status: Impaired/Different from baseline Area of Impairment: Following commands;Safety/judgement;Problem solving                       Following Commands: Follows one step commands inconsistently(with L LE, no command follow with R) Safety/Judgement: Decreased awareness of safety;Decreased awareness of deficits   Problem Solving: Slow processing;Decreased initiation;Difficulty sequencing;Requires verbal cues;Requires tactile cues General Comments: pt with noted expressive aphasia, pt with unintelligible, gardbled speech   General Comments  RUE PROM initially rigid, but able to perform PROM, WFLs.    Exercises Exercises: Other exercises Other Exercises Other Exercises: PROM to RUE 5-10 reps shoulder through digits.   Shoulder Instructions      Home Living Family/patient expects to be discharged to:: Unsure                                 Additional Comments: pt unable to provide information      Prior Functioning/Environment                   OT Problem List: Decreased strength;Decreased range  of motion;Decreased activity tolerance;Impaired balance (sitting and/or standing);Impaired vision/perception;Decreased coordination;Decreased cognition;Decreased safety awareness;Increased edema;Impaired UE functional use      OT Treatment/Interventions: Self-care/ADL training;Therapeutic exercise;Neuromuscular education;Energy conservation;DME and/or AE instruction;Therapeutic activities;Patient/family education;Balance training    OT Goals(Current goals can be found in the care plan section) Acute Rehab OT Goals Patient Stated Goal: unable to state OT Goal Formulation: Patient unable to participate in goal setting Time For Goal Achievement: 08/11/19 Potential to Achieve Goals: Good ADL Goals Pt Will Perform Grooming: sitting;with supervision Pt Will Perform Upper Body Dressing: with set-up;sitting Pt Will Perform Lower Body Dressing: with min assist;sitting/lateral leans;sit to/from stand Pt Will Transfer to Toilet: with mod assist;stand pivot transfer;bedside commode Pt/caregiver will Perform Home Exercise Program: Increased strength;Both right and left upper extremity;With Supervision Additional ADL Goal #1: Pt will follow multi-step commands in 3/3 trials with 90% accuracy.  OT Frequency: Min 2X/week   Barriers to D/C:            Co-evaluation PT/OT/SLP Co-Evaluation/Treatment: Yes Reason for Co-Treatment: Complexity of the patient's impairments (multi-system involvement)   OT goals addressed during session: ADL's and self-care      AM-PAC OT "6 Clicks" Daily Activity     Outcome Measure Help from another person eating meals?: Total Help from another person taking care of personal grooming?: A Lot Help from another person toileting, which includes using toliet, bedpan, or urinal?: Total Help from another person bathing (including washing, rinsing, drying)?: Total Help from another person to put on and taking off regular upper body clothing?: Total Help from another person  to put on and  taking off regular lower body clothing?: Total 6 Click Score: 7   End of Session Nurse Communication: Mobility status  Activity Tolerance: Patient limited by fatigue;Treatment limited secondary to medical complications (Comment) Patient left: in bed;with call bell/phone within reach;with bed alarm set  OT Visit Diagnosis: Unsteadiness on feet (R26.81);Muscle weakness (generalized) (M62.81);Hemiplegia and hemiparesis Hemiplegia - Right/Left: Right Hemiplegia - dominant/non-dominant: Dominant Hemiplegia - caused by: Nontraumatic intracerebral hemorrhage                Time: 1147-1210 OT Time Calculation (min): 23 min Charges:  OT General Charges $OT Visit: 1 Visit OT Evaluation $OT Eval Moderate Complexity: [place] C OTR/L Acute Rehabilitation Services Pager: [phone] Office: [phone]   Alexandera Kuntzman C 07/28/2019, 6:01 PM

## 2019-07-28 NOTE — Evaluation (Signed)
Speech Language Pathology Evaluation Patient Details Name: [name] [name] MRN: [id number] DOB: 08/19/1932 Today's Date: 07/28/2019 Time: 1884-1660 SLP Time Calculation (min) (ACUTE ONLY): 12 min  Problem List:  Patient Active Problem List   Diagnosis Date Noted  . 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 Brain herniation (HCC) 07/27/2019  . IVH (intraventricular hemorrhage) (HCC) 07/26/2019   Past Medical History:  Past Medical History:  Diagnosis Date  . Arthritis   . Coronary artery disease    angina  . Diabetes mellitus without complication (HCC)   . Hypertension    Past Surgical History:  Past Surgical History:  Procedure Laterality Date  . APPENDECTOMY    . CHOLECYSTECTOMY     HPI:  Pt is an 86 yo female presenting with R weakness, found to have large L thalamic hemorrhage. PMH: DM, HTN, CAD   Assessment / Plan / Recommendation Clinical Impression  Pt is more alert and communicative compared to previous date, although her speech is significantly impaired. At the word level she can be better understood for confrontational naming task, which she completed with 80% accuracy - otherwise impacted by perseverative errors. She is 100% accurate with simple yes/no questions, but only 50% accurate when they become mildly complex. Although she nods "yes" that she can notice a change in her speech, she continues to speak in long utterances and at a quick rate that is very difficult to understand. SLP attempted to cue for speech intelligibility strategies with intelligibility mostly improved with limiting speech to one word at a time. She follows other one-step commands with Min cues. She will benefit from additional SLP f/u to address motor speech, functional communication, and cognitive skills.     SLP Assessment  SLP Recommendation/Assessment: Patient needs continued Speech Lanaguage Pathology Services SLP Visit Diagnosis: Dysarthria and anarthria (R47.1);Cognitive communication  deficit (R41.841)    Follow Up Recommendations  Inpatient Rehab    Frequency and Duration min 2x/week  2 weeks      SLP Evaluation Cognition  Overall Cognitive Status: Impaired/Different from baseline Arousal/Alertness: Awake/alert Orientation Level: Oriented to person;Oriented to place;Disoriented to situation Attention: Sustained Sustained Attention: Impaired Sustained Attention Impairment: Functional basic Awareness: Impaired Awareness Impairment: Intellectual impairment;Emergent impairment Problem Solving: Impaired Problem Solving Impairment: Functional basic Safety/Judgment: Impaired       Comprehension  Auditory Comprehension Overall Auditory Comprehension: Impaired Yes/No Questions: Impaired Basic Biographical Questions: 76-100% accurate Basic Immediate Environment Questions: 75-100% accurate Complex Questions: 50-74% accurate Commands: Impaired One Step Basic Commands: 75-100% accurate Conversation: Simple    Expression Expression Primary Mode of Expression: Verbal Verbal Expression Overall Verbal Expression: Impaired Initiation: No impairment Level of Generative/Spontaneous Verbalization: (attempting to communicate at conversational level) Naming: Impairment Confrontation: Impaired(80% accurate) Verbal Errors: Perseveration Interfering Components: Speech intelligibility Non-Verbal Means of Communication: Gestures(at times conflicting with what she is saying verbally)   Oral / Motor  Motor Speech Overall Motor Speech: Impaired Respiration: Within functional limits Phonation: Normal Resonance: Within functional limits Articulation: Impaired Level of Impairment: Word Intelligibility: Intelligibility reduced Word: 50-74% accurate   GO                     Mahala Menghini., M.A. CCC-SLP Acute Rehabilitation Services Pager [phone] Office [phone]  07/28/2019, 10:12 AM

## 2019-07-28 NOTE — Progress Notes (Signed)
STROKE TEAM PROGRESS NOTE   INTERVAL HISTORY   Patient has remained neurologically sleepy but stable.  Blood pressure adequately controlled.  Repeat CT scan of the head this morning shows stable size of the left thalamic hemorrhage with increasing surrounding edema and mass-effect with partial effacement of the left lateral ventricle and family mental left-to-right shift.  Stable intraventricular hemorrhage.  Speech therapy to considerModified barium swallow later today Vitals:   07/28/19 0900 07/28/19 1000 07/28/19 1100 07/28/19 1200  BP: 134/86 136/68 137/72 (!) 157/87  Pulse:      Resp:      Temp:    99.3 F (37.4 C)  TempSrc:    Axillary  SpO2:        CBC:  Recent Labs  Lab 07/26/19 2045 07/26/19 2051  WBC 6.9  --   NEUTROABS 6.1  --   HGB 15.5* 16.3*  HCT 45.8 48.0*  MCV 91.4  --   PLT 208  --     Basic Metabolic Panel:  Recent Labs  Lab 07/26/19 2051 07/26/19 2141  NA 132* 134*  K 7.9* 4.8  CL 100 102  CO2  --  18*  GLUCOSE 227* 262*  BUN 15 11  CREATININE 0.60 0.73  CALCIUM  --  8.9   Lipid Panel:     Component Value Date/Time   CHOL 205 (H) 07/27/2019 1114   TRIG 161 (H) 07/27/2019 1114   HDL 53 07/27/2019 1114   CHOLHDL 3.9 07/27/2019 1114   VLDL 32 07/27/2019 1114   LDLCALC 120 (H) 07/27/2019 1114   HgbA1c:  Lab Results  Component Value Date   HGBA1C 6.6 (H) 07/26/2019   Urine Drug Screen:     Component Value Date/Time   LABOPIA POSITIVE 06/30/2013 1421   COCAINSCRNUR NEGATIVE 06/30/2013 1421   LABBENZ POSITIVE 06/30/2013 1421   AMPHETMU NEGATIVE 06/30/2013 1421   THCU NEGATIVE 06/30/2013 1421   LABBARB NEGATIVE 06/30/2013 1421    Alcohol Level No results found for: ETH  IMAGING past 48 hours CT Head Wo Contrast  Result Date: 07/28/2019 CLINICAL DATA:  Stroke follow-up EXAM: CT HEAD WITHOUT CONTRAST TECHNIQUE: Contiguous axial images were obtained from the base of the skull through the vertex without intravenous contrast. COMPARISON:   07/27/2019 head CT FINDINGS: Brain: Unchanged intraparenchymal hematoma centered in the left thalamus with intraventricular extension into the left lateral ventricle. Small amount of blood layering in the right occipital horn. Minimal rightward midline shift is unchanged. There is no new site of hemorrhage. The edema surrounding the hemorrhage site is unchanged. Size and configuration of the ventricles are unchanged. Vascular: No hyperdense vessel or unexpected calcification. Skull: Normal. Negative for fracture or focal lesion. Sinuses/Orbits: No acute finding. Other: None IMPRESSION: 1. Unchanged size and appearance of left thalamic intraparenchymal hematoma with intraventricular extension. 2. Unchanged minimal rightward midline shift. Electronically Signed   By: Ulyses Jarred M.D.   On: 07/28/2019 01:53   CT HEAD WO CONTRAST  Result Date: 07/27/2019 CLINICAL DATA:  Intracranial hemorrhage. Follow-up. EXAM: CT HEAD WITHOUT CONTRAST TECHNIQUE: Contiguous axial images were obtained from the base of the skull through the vertex without intravenous contrast. COMPARISON:  CT of the head without contrast 07/26/2019 FINDINGS: Brain: Left thalamic and posterior limb internal capsule hemorrhage is stable in size. Surrounding hypodensity is consistent with expected progression of edema. There is increasing mass effect with partial effacement of the left lateral ventricle in 5 mm of midline shift. No new hemorrhage is present. Intraventricular hemorrhage is again  seen. There is some hemorrhage posteriorly on the right. Vascular: Atherosclerotic calcifications are present within the cavernous internal carotid arteries bilaterally. There is no hyperdense vessel. Skull: Calvarium is intact. No focal lytic or blastic lesions are present. No significant extracranial soft tissue lesion is present. Sinuses/Orbits: The paranasal sinuses and mastoid air cells are clear. The right maxillary sinus is shrunken. The globes and orbits  are within normal limits. IMPRESSION: 1. Stable size of left thalamic and posterior limb internal capsule hemorrhage with increasing surrounding edema, as expected. 2. Increasing mass effect with partial effacement of the left lateral ventricle and 5 mm of midline shift. 3. Stable intraventricular hemorrhage. Electronically Signed   By: Marin Robertshristopher  Mattern M.D.   On: 07/27/2019 12:26   ECHOCARDIOGRAM COMPLETE  Result Date: 07/27/2019   ECHOCARDIOGRAM REPORT   Patient Name:   Clementeen HoofLUCILLE B Ayyad Date of Exam: 07/27/2019 Medical Rec #:  [id number]           Height:       64.0 in Accession #:    [id number]          Weight:       173.0 lb Date of Birth:  06/16/1933           BSA:          1.84 m Patient Age:    86 years            BP:           131/59 mmHg Patient Gender: F                   HR:           76 bpm. Exam Location:  Inpatient Procedure: 2D Echo Indications:    Stroke 434.91/I  History:        Patient has no prior history of Echocardiogram examinations.                 CAD; Risk Factors:Diabetes and Hypertension.  Sonographer:    Ross LudwigArthur Guy RDCS (AE) Referring Phys: [id number] Cira Deyoe S Wise Fees  Sonographer Comments: Suboptimal parasternal window. IMPRESSIONS  1. Left ventricular ejection fraction, by visual estimation, is 65 to 70%. The left ventricle has normal function. There is moderately increased left ventricular hypertrophy.  2. Left ventricular diastolic parameters are consistent with Grade I diastolic dysfunction (impaired relaxation).  3. Elevated left ventricular end-diastolic pressure.  4. Global right ventricle has normal systolic function.The right ventricular size is normal. No increase in right ventricular wall thickness.  5. Left atrial size was normal.  6. Right atrial size was normal.  7. The mitral valve is normal in structure. No evidence of mitral valve regurgitation. No evidence of mitral stenosis.  8. The tricuspid valve is normal in structure.  9. The aortic valve was not well visualized.  Aortic valve regurgitation is not visualized. Mild aortic valve sclerosis without stenosis. 10. The pulmonic valve was not well visualized. Pulmonic valve regurgitation is not visualized. 11. The inferior vena cava is normal in size with greater than 50% respiratory variability, suggesting right atrial pressure of 3 mmHg. 12. The interatrial septum was not well visualized. 13. Suboptimal image quality to exclude small valvular lesions. No definite cardiac source of embolism. Consider TEE if clinically indicated. FINDINGS  Left Ventricle: Left ventricular ejection fraction, by visual estimation, is 65 to 70%. The left ventricle has normal function. The left ventricle is not well visualized. There is moderately increased left ventricular hypertrophy. Left ventricular diastolic  parameters are consistent with Grade I diastolic dysfunction (impaired relaxation). Elevated left ventricular end-diastolic pressure. Right Ventricle: The right ventricular size is normal. No increase in right ventricular wall thickness. Global RV systolic function is has normal systolic function. Left Atrium: Left atrial size was normal in size. Right Atrium: Right atrial size was normal in size Pericardium: There is no evidence of pericardial effusion. Mitral Valve: The mitral valve is normal in structure. No evidence of mitral valve regurgitation. No evidence of mitral valve stenosis by observation. MV peak gradient, 7.4 mmHg. Tricuspid Valve: The tricuspid valve is normal in structure. Tricuspid valve regurgitation is trivial. Aortic Valve: The aortic valve was not well visualized. Aortic valve regurgitation is not visualized. Mild aortic valve sclerosis is present, with no evidence of aortic valve stenosis. Aortic valve mean gradient measures 6.0 mmHg. Aortic valve peak gradient measures 11.0 mmHg. Aortic valve area, by VTI measures 2.05 cm. Pulmonic Valve: The pulmonic valve was not well visualized. Pulmonic valve regurgitation is not  visualized. Pulmonic regurgitation is not visualized. Aorta: The aortic root, ascending aorta and aortic arch are all structurally normal, with no evidence of dilitation or obstruction. Venous: The inferior vena cava is normal in size with greater than 50% respiratory variability, suggesting right atrial pressure of 3 mmHg. IAS/Shunts: The interatrial septum was not well visualized.  LEFT VENTRICLE PLAX 2D LVIDd:         3.52 cm  Diastology LVIDs:         2.28 cm  LV e' lateral:   5.25 cm/s LV PW:         1.62 cm  LV E/e' lateral: 19.8 LV IVS:        1.95 cm  LV e' medial:    4.13 cm/s LVOT diam:     2.10 cm  LV E/e' medial:  25.2 LV SV:         34 ml LV SV Index:   17.74 LVOT Area:     3.46 cm  RIGHT VENTRICLE          IVC RV Basal diam:  3.00 cm  IVC diam: 1.29 cm LEFT ATRIUM           Index       RIGHT ATRIUM           Index LA diam:      3.70 cm 2.01 cm/m  RA Area:     15.00 cm LA Vol (A2C): 53.4 ml 29.03 ml/m RA Volume:   40.20 ml  21.85 ml/m LA Vol (A4C): 43.9 ml 23.87 ml/m  AORTIC VALVE AV Area (Vmax):    2.34 cm AV Area (Vmean):   1.90 cm AV Area (VTI):     2.05 cm AV Vmax:           166.00 cm/s AV Vmean:          118.000 cm/s AV VTI:            0.341 m AV Peak Grad:      11.0 mmHg AV Mean Grad:      6.0 mmHg LVOT Vmax:         112.00 cm/s LVOT Vmean:        64.900 cm/s LVOT VTI:          0.202 m LVOT/AV VTI ratio: 0.59  AORTA Ao Root diam: 3.30 cm Ao Asc diam:  3.20 cm MITRAL VALVE MV Area (PHT): 2.56 cm  SHUNTS MV Peak grad:  7.4 mmHg              Systemic VTI:  0.20 m MV Mean grad:  2.0 mmHg              Systemic Diam: 2.10 cm MV Vmax:       1.36 m/s MV Vmean:      70.8 cm/s MV VTI:        0.38 m MV PHT:        85.84 msec MV Decel Time: 296 msec MV E velocity: 104.00 cm/s 103 cm/s MV A velocity: 113.00 cm/s 70.3 cm/s MV E/A ratio:  0.92        1.5  Weston Brass MD Electronically signed by Weston Brass MD Signature Date/Time: 07/27/2019/11:39:51 PM    Final    CT HEAD CODE  STROKE WO CONTRAST  Result Date: 07/26/2019 CLINICAL DATA:  Code stroke.  Right arm weakness and aphasia. EXAM: CT HEAD WITHOUT CONTRAST TECHNIQUE: Contiguous axial images were obtained from the base of the skull through the vertex without intravenous contrast. COMPARISON:  04/20/2018 FINDINGS: Brain: An acute hemorrhage centered in the left thalamus measures 3.2 x 4.0 x 2.6 cm (estimated volume of 17 mL) with mild surrounding edema and minimal localized rightward midline shift. There is intraventricular extension with a moderate volume of hemorrhage in the left lateral ventricle and small volume of hemorrhage in the other ventricles. There is no ventriculomegaly. No definite acute infarct is identified separate from the left thalamic hemorrhage. Hypodensities in the cerebral white matter bilaterally are unchanged and nonspecific but compatible with mild chronic small vessel ischemic disease. Small chronic infarcts are suspected in the inferior left and possibly right cerebellar hemispheres. There is no extra-axial fluid collection. Vascular: Calcified atherosclerosis at the skull base. No hyperdense vessel. Skull: No fracture suspicious osseous lesion. Sinuses/Orbits: Small right maxillary sinus. No acute inflammatory changes. Unremarkable orbits. Other: None. ASPECTS [facility] Stroke Program Early CT Score) Not scored due to the presence of hemorrhage. IMPRESSION: 1. 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 2. Mild chronic small vessel ischemic disease. Critical Value/emergent results were called by telephone at the time of interpretation on 07/26/2019 at 8:49 pm to Dr. Ritta Slot, who verbally acknowledged these results. Electronically Signed   By: Sebastian Ache M.D.   On: 07/26/2019 20:56    PHYSICAL EXAM Elderly African-American lady not in distress. . Afebrile. Head is nontraumatic. Neck is supple without bruit.    Cardiac exam no murmur or gallop. Lungs are clear to  auscultation. Distal pulses are well felt. Neurological Exam :  Patient is drowsy but can be aroused with some difficulty.  She is aphasic and speaks only occasional words and makes guttural sounds.  She follows only bear midline and occasional one-step commands on the right.  She has left gaze preference and not able to look to the right past midline.  She does not blink to threat on either side.  There is right lower facial weakness.  Tongue midline.  She has significant right hemiplegia and will withdraw her right leg more than arm to painful stimuli.  She has purposeful antigravity movements on the left.  Right plantars upgoing left is downgoing.  Gait not tested.  ASSESSMENT/PLAN [name] is a 86 y.o. female with history of HTN, DB, CAD presenting with R sided weakness.   Stroke:   Hypertensive L thalamic ICH w/ IVH, cytotoxic edema and left to right midline shift brain herniation  Code Stroke CT head L thalamic ICH w/ IVH. Mild Small vessel disease.   MRI pending   MRA head pending  MRA neck not needed  2D Echo normal ejection fraction 65 to 70%.  No wall motion abnormality.    LDL 120 mg percent.  HgbA1c 6.6   SCDs for VTE prophylaxis  No antithrombotic prior to admission, now on No antithrombotic given hmg  Therapy recommendations:  pending   Disposition:  pending   Ok to be OOB today with mild therapy  Hypertensive Urgency  SBP > 200s on arrival  On Cleviprex  Home meds:  Enalapril 10, lisinopril 10  Stable on gtt . SBP goal < 140 . Wean cleviprex . Prn labetalol  . Long-term BP goal normotensive  Diabetes type II   Home meds:  Metformin 500 bid  HgbA1c pending, goal < 7.0  CBGs Recent Labs    07/28/19 0335 07/28/19 0815 07/28/19 1219  GLUCAP 158* 99 109*      SSI  Dysphagia . Secondary to stroke . NPO . Speech on board . Did not pass swallow . Grandson, POA, does not think she would want a FT  Other Stroke Risk  Factors  Advanced age  Coronary artery disease  Other Active Problems  Hyponatremia 132->134  Hyperkalemia, resolved 74.9->4.8  Hospital day # 2    She has presented with a hypertensive left thalamic hemorrhage with intraventricular extension and cytotoxic edema Continue strict blood pressure control with systolic below  160.  Use IV labetalol and wean Cardene drip as tolerated.  Keep normothermic, euglycemic and euvolemic.  Speech therapy to check swallow eval later this afternoon No need for neurosurgical intervention at the present time but may reconsider if needed.  .  Patient is full code for now but family may lean towards DNR after arrival and meeting with [place] This patient is critically ill and at significant risk of neurological worsening, death and care requires constant monitoring of vital signs, hemodynamics,respiratory and cardiac monitoring, extensive review of multiple databases, frequent neurological assessment, discussion with family, other specialists and medical decision making of high complexity.I have made any additions or clarifications directly to the above note.This critical care time does not reflect procedure time, or teaching time or supervisory time of PA/NP/Med Resident etc but could involve care discussion time.  I spent 30 minutes of neurocritical care time  in the care of  this patient.     Delia Heady, MD Medical Director [facility] Pager: [phone] 07/28/2019 2:23 PM   To contact Stroke Continuity provider, please refer to [email or web address]. After hours, contact General Neurology

## 2019-07-28 NOTE — Consult Note (Addendum)
Physical Medicine and Rehabilitation Consult   Reason for Consult:Stroke with functional deficits.  Referring Physician: Dr. Pearlean BrownieSethi   HPI: [name] [name] is a 86 y.o. female with history of CAD, T2DM, HTN who was admitted on 07/26/2019 after found at home with right sided weakness and difficulty talking. History taken from chart review due to cognition. Patient noted to be hyperkalemic with K+7.9. CT head showing left thalamic and posterior limb of internal capsule hemorrhage thought to be secondary to hypertension.  She was placed on Cleviprex and follow up CT showed increase in edema and mass effect, but stable bleed. Echo with EF 65-70% and moderate LVH. She remains NPO and family does not want feeding tube per notes. Patient with resultant lethargy with right hemiparesis and pusher tendencies, aphasia and left gaze preference with inability to move eyes past midline. Therapy evaluations completed and CIR recommended due to functional decline.   Review of Systems  Unable to perform ROS: Language   Past Medical History:  Diagnosis Date   Arthritis    Coronary artery disease    angina   Diabetes mellitus without complication (HCC)    Hypertension     Past Surgical History:  Procedure Laterality Date   APPENDECTOMY     CHOLECYSTECTOMY      No family history on file., unable to obtain from patient.    Social History:  reports that she has never smoked. She has never used smokeless tobacco. She reports that she does not drink alcohol or use drugs.    Allergies  Allergen Reactions   Contrast Media [Iodinated Diagnostic Agents] Anaphylaxis    Medications Prior to Admission  Medication Sig Dispense Refill   betamethasone valerate (VALISONE) 0.1 % cream Apply 1 application topically 2 (two) times daily.     clotrimazole (LOTRIMIN) 1 % cream Apply 1 application topically See admin instructions. Apply topically to toenails twice daily     dicyclomine (BENTYL)  20 MG tablet Take 20 mg by mouth 2 (two) times daily as needed for spasms.      enalapril (VASOTEC) 10 MG tablet Take 1 tablet (10 mg total) by mouth daily. (Patient not taking: Reported on 07/26/2019) 30 tablet 0   lisinopril (ZESTRIL) 10 MG tablet Take 10 mg by mouth daily.     LORazepam (ATIVAN) 1 MG tablet Take 1 mg by mouth 2 (two) times daily as needed for anxiety.      metFORMIN (GLUCOPHAGE) 500 MG tablet Take 1 tablet by mouth 2 (two) times daily.  6   oxyCODONE-acetaminophen (PERCOCET/ROXICET) 5-325 MG per tablet Take 1-2 tablets by mouth every 4 (four) hours as needed. 15 tablet 0   traMADol (ULTRAM) 50 MG tablet Take 1 tablet by mouth every 4 (four) hours as needed (pain).   3    Home: Home Living Family/patient expects to be discharged to:: Unsure Additional Comments: pt unable to provide information  Functional History: Prior Function Comments: Unknown; per chart is widowed and ? lives alone Functional Status:  Mobility: Bed Mobility Overal bed mobility: Needs Assistance Bed Mobility: Rolling, Sidelying to Sit, Sit to Sidelying Rolling: Max assist(to her rt) Sidelying to sit: Max assist Sit to sidelying: Max assist General bed mobility comments: pt able to assist with supine scoot to her left (to sit at Lt EOB); once rolling initiated and she could reach rail with her LUE, she was able to assist to turn onto her side; assisted with mvoing legs off EOB Transfers General transfer  comment: unsafe to attempt due to contralateral pushing      ADL:    Cognition: Cognition Overall Cognitive Status: Difficult to assess Orientation Level: Other (comment)(UTA) Cognition Arousal/Alertness: Awake/alert Behavior During Therapy: WFL for tasks assessed/performed Overall Cognitive Status: Difficult to assess Difficult to assess due to: Impaired communication   Blood pressure 125/72, pulse 73, temperature 98.4 F (36.9 C), temperature source Axillary, resp. rate 18, SpO2  95 %. Physical Exam  Vitals reviewed. Constitutional: She appears well-developed and well-nourished.  HENT:  Head: Normocephalic and atraumatic.  Eyes: EOM are normal. Right eye exhibits no discharge. Left eye exhibits no discharge.  Neck: No tracheal deviation present. No thyromegaly present.  Respiratory: Effort normal. No respiratory distress.  GI: Soft. She exhibits distension.  Musculoskeletal:        General: Edema (Left hand) present.  Neurological: She is alert.  Inconsistent ability to follow commands Right facial weakness ?Decrease sensation on right Right side appears to be flacid LUE: appears to be 5/5 proximal to distal LLE: appears to be 3/5 proximal to distal  Skin:  Left hand hematoma  Psychiatric:  Unable to assess due to mentation    Results for orders placed or performed during the hospital encounter of 07/26/19 (from the past 24 hour(s))  Lipid panel     Status: Abnormal   Collection Time: 07/27/19 11:14 AM  Result Value Ref Range   Cholesterol 205 (H) 0 - 200 mg/dL   Triglycerides 161 (H) <150 mg/dL   HDL 53 >40 mg/dL   Total CHOL/HDL Ratio 3.9 RATIO   VLDL 32 0 - 40 mg/dL   LDL Cholesterol 120 (H) 0 - 99 mg/dL  Glucose, capillary     Status: Abnormal   Collection Time: 07/27/19  4:02 PM  Result Value Ref Range   Glucose-Capillary 111 (H) 70 - 99 mg/dL   Comment 1 Notify RN    Comment 2 Document in Chart   Glucose, capillary     Status: Abnormal   Collection Time: 07/27/19  7:47 PM  Result Value Ref Range   Glucose-Capillary 138 (H) 70 - 99 mg/dL  Glucose, capillary     Status: Abnormal   Collection Time: 07/27/19 11:39 PM  Result Value Ref Range   Glucose-Capillary 132 (H) 70 - 99 mg/dL  Glucose, capillary     Status: Abnormal   Collection Time: 07/28/19  3:35 AM  Result Value Ref Range   Glucose-Capillary 158 (H) 70 - 99 mg/dL  Glucose, capillary     Status: None   Collection Time: 07/28/19  8:15 AM  Result Value Ref Range    Glucose-Capillary 99 70 - 99 mg/dL   Comment 1 Notify RN    Comment 2 Document in Chart    CT Head Wo Contrast  Result Date: 07/28/2019 CLINICAL DATA:  Stroke follow-up EXAM: CT HEAD WITHOUT CONTRAST TECHNIQUE: Contiguous axial images were obtained from the base of the skull through the vertex without intravenous contrast. COMPARISON:  07/27/2019 head CT FINDINGS: Brain: Unchanged intraparenchymal hematoma centered in the left thalamus with intraventricular extension into the left lateral ventricle. Small amount of blood layering in the right occipital horn. Minimal rightward midline shift is unchanged. There is no new site of hemorrhage. The edema surrounding the hemorrhage site is unchanged. Size and configuration of the ventricles are unchanged. Vascular: No hyperdense vessel or unexpected calcification. Skull: Normal. Negative for fracture or focal lesion. Sinuses/Orbits: No acute finding. Other: None IMPRESSION: 1. Unchanged size and appearance of left thalamic  intraparenchymal hematoma with intraventricular extension. 2. Unchanged minimal rightward midline shift. Electronically Signed   By: Deatra Robinson M.D.   On: 07/28/2019 01:53   CT HEAD WO CONTRAST  Result Date: 07/27/2019 CLINICAL DATA:  Intracranial hemorrhage. Follow-up. EXAM: CT HEAD WITHOUT CONTRAST TECHNIQUE: Contiguous axial images were obtained from the base of the skull through the vertex without intravenous contrast. COMPARISON:  CT of the head without contrast 07/26/2019 FINDINGS: Brain: Left thalamic and posterior limb internal capsule hemorrhage is stable in size. Surrounding hypodensity is consistent with expected progression of edema. There is increasing mass effect with partial effacement of the left lateral ventricle in 5 mm of midline shift. No new hemorrhage is present. Intraventricular hemorrhage is again seen. There is some hemorrhage posteriorly on the right. Vascular: Atherosclerotic calcifications are present within the  cavernous internal carotid arteries bilaterally. There is no hyperdense vessel. Skull: Calvarium is intact. No focal lytic or blastic lesions are present. No significant extracranial soft tissue lesion is present. Sinuses/Orbits: The paranasal sinuses and mastoid air cells are clear. The right maxillary sinus is shrunken. The globes and orbits are within normal limits. IMPRESSION: 1. Stable size of left thalamic and posterior limb internal capsule hemorrhage with increasing surrounding edema, as expected. 2. Increasing mass effect with partial effacement of the left lateral ventricle and 5 mm of midline shift. 3. Stable intraventricular hemorrhage. Electronically Signed   By: Marin Roberts M.D.   On: 07/27/2019 12:26   ECHOCARDIOGRAM COMPLETE  Result Date: 07/27/2019   ECHOCARDIOGRAM REPORT   Patient Name:   [name] [name] Date of Exam: 07/27/2019 Medical Rec #:  [id number]           Height:       64.0 in Accession #:    [id number]          Weight:       173.0 lb Date of Birth:  Nov 28, 1932           BSA:          1.84 m Patient Age:    86 years            BP:           131/59 mmHg Patient Gender: F                   HR:           76 bpm. Exam Location:  Inpatient Procedure: 2D Echo Indications:    Stroke 434.91/I  History:        Patient has no prior history of Echocardiogram examinations.                 CAD; Risk Factors:Diabetes and Hypertension.  Sonographer:    Ross Ludwig RDCS (AE) Referring Phys: [id number] PRAMOD S SETHI  Sonographer Comments: Suboptimal parasternal window. IMPRESSIONS  1. Left ventricular ejection fraction, by visual estimation, is 65 to 70%. The left ventricle has normal function. There is moderately increased left ventricular hypertrophy.  2. Left ventricular diastolic parameters are consistent with Grade I diastolic dysfunction (impaired relaxation).  3. Elevated left ventricular end-diastolic pressure.  4. Global right ventricle has normal systolic function.The right ventricular  size is normal. No increase in right ventricular wall thickness.  5. Left atrial size was normal.  6. Right atrial size was normal.  7. The mitral valve is normal in structure. No evidence of mitral valve regurgitation. No evidence of mitral stenosis.  8. The tricuspid valve is normal in  structure.  9. The aortic valve was not well visualized. Aortic valve regurgitation is not visualized. Mild aortic valve sclerosis without stenosis. 10. The pulmonic valve was not well visualized. Pulmonic valve regurgitation is not visualized. 11. The inferior vena cava is normal in size with greater than 50% respiratory variability, suggesting right atrial pressure of 3 mmHg. 12. The interatrial septum was not well visualized. 13. Suboptimal image quality to exclude small valvular lesions. No definite cardiac source of embolism. Consider TEE if clinically indicated. FINDINGS  Left Ventricle: Left ventricular ejection fraction, by visual estimation, is 65 to 70%. The left ventricle has normal function. The left ventricle is not well visualized. There is moderately increased left ventricular hypertrophy. Left ventricular diastolic parameters are consistent with Grade I diastolic dysfunction (impaired relaxation). Elevated left ventricular end-diastolic pressure. Right Ventricle: The right ventricular size is normal. No increase in right ventricular wall thickness. Global RV systolic function is has normal systolic function. Left Atrium: Left atrial size was normal in size. Right Atrium: Right atrial size was normal in size Pericardium: There is no evidence of pericardial effusion. Mitral Valve: The mitral valve is normal in structure. No evidence of mitral valve regurgitation. No evidence of mitral valve stenosis by observation. MV peak gradient, 7.4 mmHg. Tricuspid Valve: The tricuspid valve is normal in structure. Tricuspid valve regurgitation is trivial. Aortic Valve: The aortic valve was not well visualized. Aortic valve  regurgitation is not visualized. Mild aortic valve sclerosis is present, with no evidence of aortic valve stenosis. Aortic valve mean gradient measures 6.0 mmHg. Aortic valve peak gradient measures 11.0 mmHg. Aortic valve area, by VTI measures 2.05 cm. Pulmonic Valve: The pulmonic valve was not well visualized. Pulmonic valve regurgitation is not visualized. Pulmonic regurgitation is not visualized. Aorta: The aortic root, ascending aorta and aortic arch are all structurally normal, with no evidence of dilitation or obstruction. Venous: The inferior vena cava is normal in size with greater than 50% respiratory variability, suggesting right atrial pressure of 3 mmHg. IAS/Shunts: The interatrial septum was not well visualized.  LEFT VENTRICLE PLAX 2D LVIDd:         3.52 cm  Diastology LVIDs:         2.28 cm  LV e' lateral:   5.25 cm/s LV PW:         1.62 cm  LV E/e' lateral: 19.8 LV IVS:        1.95 cm  LV e' medial:    4.13 cm/s LVOT diam:     2.10 cm  LV E/e' medial:  25.2 LV SV:         34 ml LV SV Index:   17.74 LVOT Area:     3.46 cm  RIGHT VENTRICLE          IVC RV Basal diam:  3.00 cm  IVC diam: 1.29 cm LEFT ATRIUM           Index       RIGHT ATRIUM           Index LA diam:      3.70 cm 2.01 cm/m  RA Area:     15.00 cm LA Vol (A2C): 53.4 ml 29.03 ml/m RA Volume:   40.20 ml  21.85 ml/m LA Vol (A4C): 43.9 ml 23.87 ml/m  AORTIC VALVE AV Area (Vmax):    2.34 cm AV Area (Vmean):   1.90 cm AV Area (VTI):     2.05 cm AV Vmax:  166.00 cm/s AV Vmean:          118.000 cm/s AV VTI:            0.341 m AV Peak Grad:      11.0 mmHg AV Mean Grad:      6.0 mmHg LVOT Vmax:         112.00 cm/s LVOT Vmean:        64.900 cm/s LVOT VTI:          0.202 m LVOT/AV VTI ratio: 0.59  AORTA Ao Root diam: 3.30 cm Ao Asc diam:  3.20 cm MITRAL VALVE MV Area (PHT): 2.56 cm              SHUNTS MV Peak grad:  7.4 mmHg              Systemic VTI:  0.20 m MV Mean grad:  2.0 mmHg              Systemic Diam: 2.10 cm MV Vmax:        1.36 m/s MV Vmean:      70.8 cm/s MV VTI:        0.38 m MV PHT:        85.84 msec MV Decel Time: 296 msec MV E velocity: 104.00 cm/s 103 cm/s MV A velocity: 113.00 cm/s 70.3 cm/s MV E/A ratio:  0.92        1.5  Weston Brass MD Electronically signed by Weston Brass MD Signature Date/Time: 07/27/2019/11:39:51 PM    Final    CT HEAD CODE STROKE WO CONTRAST  Result Date: 07/26/2019 CLINICAL DATA:  Code stroke.  Right arm weakness and aphasia. EXAM: CT HEAD WITHOUT CONTRAST TECHNIQUE: Contiguous axial images were obtained from the base of the skull through the vertex without intravenous contrast. COMPARISON:  04/20/2018 FINDINGS: Brain: An acute hemorrhage centered in the left thalamus measures 3.2 x 4.0 x 2.6 cm (estimated volume of 17 mL) with mild surrounding edema and minimal localized rightward midline shift. There is intraventricular extension with a moderate volume of hemorrhage in the left lateral ventricle and small volume of hemorrhage in the other ventricles. There is no ventriculomegaly. No definite acute infarct is identified separate from the left thalamic hemorrhage. Hypodensities in the cerebral white matter bilaterally are unchanged and nonspecific but compatible with mild chronic small vessel ischemic disease. Small chronic infarcts are suspected in the inferior left and possibly right cerebellar hemispheres. There is no extra-axial fluid collection. Vascular: Calcified atherosclerosis at the skull base. No hyperdense vessel. Skull: No fracture suspicious osseous lesion. Sinuses/Orbits: Small right maxillary sinus. No acute inflammatory changes. Unremarkable orbits. Other: None. ASPECTS [facility] Stroke Program Early CT Score) Not scored due to the presence of hemorrhage. IMPRESSION: 1. 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 2. Mild chronic small vessel ischemic disease. Critical Value/emergent results were called by telephone at the time of interpretation on 07/26/2019 at  8:49 pm to Dr. Ritta Slot, who verbally acknowledged these results. Electronically Signed   By: Sebastian Ache M.D.   On: 07/26/2019 20:56    Assessment/Plan: Diagnosis: left thalamic and posterior limb of internal capsule hemorrhage  Stroke: Continue secondary stroke prophylaxis and Risk Factor Modification listed below:   Blood Pressure Management:  Continue current medication with prn's with permisive HTN per primary team Diabetes management Right sided hemiparesis: fit for orthosis to prevent contractures (resting hand splint for day, wrist cock up splint at night, PRAFO, etc) Motor recovery: Fluoxetine Labs independently reviewed.  Records reviewed and summated above.  1. Does the need for close, 24 hr/day medical supervision in concert with the patient's rehab needs make it unreasonable for this patient to be served in a less intensive setting? Yes  2. Co-Morbidities requiring supervision/potential complications: lethargy with right hemiparesis and pusher tendencies, aphasia, left gaze preference, DM with hyperglycemia (Monitor in accordance with exercise and adjust meds as necessary) hyperkalemia (continue to monitor and replete as necessary), hyponatremia (repeat labs, consider intervention if necessary) 3. Due to bladder management, bowel management, safety, disease management, medication administration and patient education, does the patient require 24 hr/day rehab nursing? Yes 4. Does the patient require coordinated care of a physician, rehab nurse, therapy disciplines of PT/OT/SLP to address physical and functional deficits in the context of the above medical diagnosis(es)? Yes Addressing deficits in the following areas: balance, endurance, locomotion, strength, transferring, bathing, dressing, toileting, cognition, speech, swallowing and psychosocial support 5. Can the patient actively participate in an intensive therapy program of at least 3 hrs of therapy per day at least 5  days per week? Yes 6. The potential for patient to make measurable gains while on inpatient rehab is excellent 7. Anticipated functional outcomes upon discharge from inpatient rehab are min assist and mod assist  with PT, min assist and mod assist with OT, supervision and min assist with SLP. 8. Estimated rehab length of stay to reach the above functional goals is: 17-21 days. 9. Anticipated discharge destination: TBD 10. Overall Rehab/Functional Prognosis: good  RECOMMENDATIONS: This patient's condition is appropriate for continued rehabilitative care in the following setting: CIR if caregiver support available upon discharge. Patient has agreed to participate in recommended program. Potentially Note that insurance prior authorization may be required for reimbursement for recommended care.  Comment: Rehab Admissions Coordinator to follow up.  I have personally performed a face to face diagnostic evaluation, including, but not limited to relevant history and physical exam findings, of this patient and developed relevant assessment and plan.  Additionally, I have reviewed and concur with the physician assistant's documentation above.   Maryla MorrowAnkit Haniyah Maciolek, MD, ABPMR Jacquelynn CreePamela S Love, PA-C 07/28/2019

## 2019-07-28 NOTE — Progress Notes (Signed)
Physical Therapy Treatment Patient Details Name: [name] [name] MRN: [id number] DOB: Aug 27, 1932 Today's Date: 07/28/2019    History of Present Illness 86 y.o. female with a history of diabetes, hypertension, coronary artery disease who presented 07/26/19 with right-sided weakness and BP 200/99; CT head large thalamic hemorrhage on the left; repeat head CT 1/5 showed thalamic hemorrhage stable in size, with increased surrounding edema, including 5 mm midline shift    PT Comments    Pt attempting to vocalize however unable to understand pt. Pt remains to have increased R UE and LE tone presenting with rigidity. Pt able to follow commands on L UE and LE. Pt continues to require maximal assist for mobility. Pt indep PTA and would benefit greatly from CIR for maximal functional recovery. Acute PT to cont to follow.    Follow Up Recommendations  CIR;Supervision/Assistance - 24 hour     Equipment Recommendations  Other (comment)    Recommendations for Other Services Rehab consult;OT consult;Speech consult     Precautions / Restrictions Precautions Precautions: Fall Restrictions Weight Bearing Restrictions: No    Mobility  Bed Mobility Overal bed mobility: Needs Assistance Bed Mobility: Rolling;Sidelying to Sit;Sit to Sidelying Rolling: Max assist Sidelying to sit: Max assist;+2 for physical assistance     Sit to sidelying: Max assist;+2 for physical assistance General bed mobility comments: pt very rigid requiring maxA for LE mangement and trunk management  Transfers                 General transfer comment: unsafe, defer to hoyer lift  Ambulation/Gait                 Stairs             Wheelchair Mobility    Modified Rankin (Stroke Patients Only) Modified Rankin (Stroke Patients Only) Pre-Morbid Rankin Score: No symptoms Modified Rankin: Severe disability     Balance Overall balance assessment: Needs assistance Sitting-balance support:  Feet unsupported;Single extremity supported Sitting balance-Leahy Scale: Zero Sitting balance - Comments: initially very strong contralateral push (rt pushing to her left side) with left lateral lean; progressed with wt-shifging and reaching activities to brief periods of min assist to maintain midline Postural control: Right lateral lean;Posterior lean                                  Cognition Arousal/Alertness: Awake/alert Behavior During Therapy: Flat affect Overall Cognitive Status: Impaired/Different from baseline Area of Impairment: Following commands;Safety/judgement;Problem solving                       Following Commands: Follows one step commands inconsistently(with L LE, no command follow with R) Safety/Judgement: Decreased awareness of safety;Decreased awareness of deficits   Problem Solving: Slow processing;Decreased initiation;Difficulty sequencing;Requires verbal cues;Requires tactile cues General Comments: pt with noted expressive aphasia, pt is trying to communicate however unable to understand      Exercises General Exercises - Lower Extremity Long Arc Quad: AROM;Left;5 reps(with max verbal cues)    General Comments General comments (skin integrity, edema, etc.): Pt with very rigid R UE and LE, no active movement      Pertinent Vitals/Pain Pain Assessment: Faces Faces Pain Scale: No hurt Pain Location: difficulty stating, appears restless in bed Pain Descriptors / Indicators: Restless Pain Intervention(s): Limited activity within patient's tolerance;Monitored during session;Repositioned    Home Living  Prior Function            PT Goals (current goals can now be found in the care plan section) Progress towards PT goals: Progressing toward goals    Frequency    Min 4X/week      PT Plan Current plan remains appropriate    Co-evaluation PT/OT/SLP Co-Evaluation/Treatment: Yes Reason for  Co-Treatment: Complexity of the patient's impairments (multi-system involvement) PT goals addressed during session: Mobility/safety with mobility        AM-PAC PT "6 Clicks" Mobility   Outcome Measure  Help needed turning from your back to your side while in a flat bed without using bedrails?: Total Help needed moving from lying on your back to sitting on the side of a flat bed without using bedrails?: Total Help needed moving to and from a bed to a chair (including a wheelchair)?: Total Help needed standing up from a chair using your arms (e.g., wheelchair or bedside chair)?: Total Help needed to walk in hospital room?: Total Help needed climbing 3-5 steps with a railing? : Total 6 Click Score: 6    End of Session   Activity Tolerance: Treatment limited secondary to medical complications (Comment) Patient left: in bed;with call bell/phone within reach;with bed alarm set;with nursing/sitter in room Nurse Communication: Mobility status PT Visit Diagnosis: Hemiplegia and hemiparesis Hemiplegia - Right/Left: Right Hemiplegia - dominant/non-dominant: Dominant Hemiplegia - caused by: Nontraumatic SAH     Time: 2542-7062 PT Time Calculation (min) (ACUTE ONLY): 24 min  Charges:  $Neuromuscular Re-education: 8-22 mins                     Kittie Plater, PT, DPT Acute Rehabilitation Services Pager #: [phone] Office #: [phone]    Berline Lopes 07/28/2019, 1:24 PM

## 2019-07-28 NOTE — Progress Notes (Signed)
Patient arrived to 6N17 via bed. Report received from [facility], Charity fundraiser. Patient with 2 PIVs, both saline locked. See flowsheets for vital signs and initial assessment. Skin check completed with Julieanne Cotton, RN. Bed locked and in low position. Carney Bern (daughter) phone number: [phone]

## 2019-07-28 NOTE — Progress Notes (Signed)
STROKE TEAM PROGRESS NOTE   INTERVAL HISTORY Patient is lying in bed.  No changes.  Vital signs stable. Therapist recommend rehab and skilled nursing set team.  Family prefers 1 in [place] area Vitals:   07/28/19 2000 07/28/19 2324 07/29/19 0418 07/29/19 0655  BP: (!) 157/72 (!) 156/64 (!) 167/75 (!) 166/73  Pulse: 79 73 73   Resp: 18 18 18    Temp: 98.4 F (36.9 C) 98.9 F (37.2 C) 98.9 F (37.2 C)   TempSrc: Oral Oral Oral   SpO2: 95% 97% 96%     CBC:  Recent Labs  Lab 07/26/19 2045 07/26/19 2051  WBC 6.9  --   NEUTROABS 6.1  --   HGB 15.5* 16.3*  HCT 45.8 48.0*  MCV 91.4  --   PLT 208  --     Basic Metabolic Panel:  Recent Labs  Lab 07/26/19 2051 07/26/19 2141  NA 132* 134*  K 7.9* 4.8  CL 100 102  CO2  --  18*  GLUCOSE 227* 262*  BUN 15 11  CREATININE 0.60 0.73  CALCIUM  --  8.9   Lipid Panel:     Component Value Date/Time   CHOL 205 (H) 07/27/2019 1114   TRIG 161 (H) 07/27/2019 1114   HDL 53 07/27/2019 1114   CHOLHDL 3.9 07/27/2019 1114   VLDL 32 07/27/2019 1114   LDLCALC 120 (H) 07/27/2019 1114    IMAGING past 48 hours CT Head Wo Contrast  Result Date: 07/28/2019 CLINICAL DATA:  Stroke follow-up EXAM: CT HEAD WITHOUT CONTRAST TECHNIQUE: Contiguous axial images were obtained from the base of the skull through the vertex without intravenous contrast. COMPARISON:  07/27/2019 head CT FINDINGS: Brain: Unchanged intraparenchymal hematoma centered in the left thalamus with intraventricular extension into the left lateral ventricle. Small amount of blood layering in the right occipital horn. Minimal rightward midline shift is unchanged. There is no new site of hemorrhage. The edema surrounding the hemorrhage site is unchanged. Size and configuration of the ventricles are unchanged. Vascular: No hyperdense vessel or unexpected calcification. Skull: Normal. Negative for fracture or focal lesion. Sinuses/Orbits: No acute finding. Other: None IMPRESSION:  1. Unchanged size and appearance of left thalamic intraparenchymal hematoma with intraventricular extension. 2. Unchanged minimal rightward midline shift. Electronically Signed   By: 09/24/2019 M.D.   On: 07/28/2019 01:53   DG Swallowing Func-Speech Pathology  Result Date: 07/28/2019 Objective Swallowing Evaluation: Type of Study: MBS-Modified Barium Swallow Study  Patient Details Name: [name] [name] MRN: [name] Date of Birth: 10-22-1932 Today's Date: 07/28/2019 Time: SLP Start Time (ACUTE ONLY): 1432 -SLP Stop Time (ACUTE ONLY): 1450 SLP Time Calculation (min) (ACUTE ONLY): 18 min Past Medical History: Past Medical History: Diagnosis Date . Arthritis  . Coronary artery disease   angina . Diabetes mellitus without complication (HCC)  . Hypertension  Past Surgical History: Past Surgical History: Procedure Laterality Date . APPENDECTOMY   . CHOLECYSTECTOMY   HPI: Pt is an 86 yo female presenting with R weakness, found to have large L thalamic hemorrhage. PMH: DM, HTN, CAD  Subjective: pt more alert today Assessment / Plan / Recommendation CHL IP CLINICAL IMPRESSIONS 07/28/2019 Clinical Impression Pt has a moderate oropharyngeal dysphagia as a result of CN VII weakness and reduced coordination. She has trouble achieving full labial seal and oral clearance on her R side. Mild anterior spillage occurs and pocketing increases as boluses become more solid. She has trouble following commands with attempted strategies, but she did attempt a  lingual sweep that cleared most of the buccal residue after repeated efforts. Pt is capable of achieveing good laryngeal vestibule closure and pharyngeal clearance, but aspiration occurs with thin and nectar thick liquids when she has trouble coordinating her swallow. At times this means a bolus spills back to her pharynx prematurely. Other times it is because she is trying to manage a subsequent bolus orally and hasn't swallowed to clear her pharynx yet. This also happened when  she was trying to clear oral residue. Honey thick liquids and purees give allow her to properly close her airway before swallowing, even if her timing is off. Recommend starting Dys 1 diet and honey thick liquids. Full supervision is recommended given impulsive rate of intake and reduced awareness of pocketing. Will continue to follow acutely. SLP Visit Diagnosis Dysphagia, oropharyngeal phase (R13.12) Attention and concentration deficit following -- Frontal lobe and executive function deficit following -- Impact on safety and function Mild aspiration risk;Moderate aspiration risk   CHL IP TREATMENT RECOMMENDATION 07/28/2019 Treatment Recommendations Therapy as outlined in treatment plan below   Prognosis 07/28/2019 Prognosis for Safe Diet Advancement Good Barriers to Reach Goals Cognitive deficits;Language deficits Barriers/Prognosis Comment -- CHL IP DIET RECOMMENDATION 07/28/2019 SLP Diet Recommendations Dysphagia 1 (Puree) solids;Honey thick liquids Liquid Administration via Cup;Straw Medication Administration Crushed with puree Compensations Minimize environmental distractions;Slow rate;Small sips/bites;Lingual sweep for clearance of pocketing;Monitor for anterior loss;Other (Comment) Postural Changes Remain semi-upright after after feeds/meals (Comment);Seated upright at 90 degrees   CHL IP OTHER RECOMMENDATIONS 07/28/2019 Recommended Consults -- Oral Care Recommendations Oral care BID Other Recommendations Order thickener from pharmacy;Prohibited food (jello, ice cream, thin soups);Remove water pitcher   CHL IP FOLLOW UP RECOMMENDATIONS 07/28/2019 Follow up Recommendations Inpatient Rehab   CHL IP FREQUENCY AND DURATION 07/28/2019 Speech Therapy Frequency (ACUTE ONLY) min 2x/week Treatment Duration 2 weeks      CHL IP ORAL PHASE 07/28/2019 Oral Phase Impaired Oral - Pudding Teaspoon -- Oral - Pudding Cup -- Oral - Honey Teaspoon -- Oral - Honey Cup Right anterior bolus loss;Decreased bolus cohesion;Delayed oral transit  Oral - Nectar Teaspoon -- Oral - Nectar Cup Right anterior bolus loss;Decreased bolus cohesion;Delayed oral transit Oral - Nectar Straw Right anterior bolus loss;Decreased bolus cohesion;Delayed oral transit Oral - Thin Teaspoon -- Oral - Thin Cup Right anterior bolus loss;Decreased bolus cohesion;Delayed oral transit Oral - Thin Straw Right anterior bolus loss;Decreased bolus cohesion;Delayed oral transit Oral - Puree Right anterior bolus loss;Decreased bolus cohesion;Delayed oral transit Oral - Mech Soft Right anterior bolus loss;Decreased bolus cohesion;Delayed oral transit;Right pocketing in lateral sulci Oral - Regular -- Oral - Multi-Consistency -- Oral - Pill -- Oral Phase - Comment --  CHL IP PHARYNGEAL PHASE 07/28/2019 Pharyngeal Phase Impaired Pharyngeal- Pudding Teaspoon -- Pharyngeal -- Pharyngeal- Pudding Cup -- Pharyngeal -- Pharyngeal- Honey Teaspoon -- Pharyngeal -- Pharyngeal- Honey Cup Delayed swallow initiation-pyriform sinuses Pharyngeal -- Pharyngeal- Nectar Teaspoon -- Pharyngeal -- Pharyngeal- Nectar Cup Delayed swallow initiation-pyriform sinuses;Penetration/Aspiration during swallow Pharyngeal Material enters airway, passes BELOW cords without attempt by patient to eject out (silent aspiration) Pharyngeal- Nectar Straw Delayed swallow initiation-pyriform sinuses;Penetration/Aspiration during swallow Pharyngeal Material enters airway, passes BELOW cords and not ejected out despite cough attempt by patient Pharyngeal- Thin Teaspoon -- Pharyngeal -- Pharyngeal- Thin Cup Delayed swallow initiation-pyriform sinuses;Penetration/Aspiration during swallow Pharyngeal Material enters airway, passes BELOW cords and not ejected out despite cough attempt by patient Pharyngeal- Thin Straw Delayed swallow initiation-pyriform sinuses;Penetration/Aspiration during swallow Pharyngeal Material enters airway, passes BELOW cords and not ejected out  despite cough attempt by patient Pharyngeal- Puree WFL Pharyngeal  -- Pharyngeal- Mechanical Soft WFL Pharyngeal -- Pharyngeal- Regular -- Pharyngeal -- Pharyngeal- Multi-consistency -- Pharyngeal -- Pharyngeal- Pill -- Pharyngeal -- Pharyngeal Comment --  CHL IP CERVICAL ESOPHAGEAL PHASE 07/28/2019 Cervical Esophageal Phase WFL Pudding Teaspoon -- Pudding Cup -- Honey Teaspoon -- Honey Cup -- Nectar Teaspoon -- Nectar Cup -- Nectar Straw -- Thin Teaspoon -- Thin Cup -- Thin Straw -- Puree -- Mechanical Soft -- Regular -- Multi-consistency -- Pill -- Cervical Esophageal Comment -- Mahala Menghini., M.A. CCC-SLP Acute Rehabilitation Services Pager [phone] Office [phone] 07/28/2019, 3:25 PM               PHYSICAL EXAM    Elderly African-American lady not in distress. . Afebrile. Head is nontraumatic. Neck is supple without bruit.    Cardiac exam no murmur or gallop. Lungs are clear to auscultation. Distal pulses are well felt. Neurological Exam :  Patient is drowsy but can be aroused with some difficulty.  She is aphasic and speaks only occasional words and makes guttural sounds.  She follows only bear midline and occasional one-step commands on the right.  She has left gaze preference and not able to look to the right past midline.  She does not blink to threat on either side.  There is right lower facial weakness.  Tongue midline.  She has significant right hemiplegia and will withdraw her right leg more than arm to painful stimuli.  She has purposeful antigravity movements on the left.  Right plantars upgoing left is downgoing.  Gait not tested.   ASSESSMENT/PLAN [name] [name] is a 86 y.o. female with history of HTN, DB, CAD presenting with R sided weakness.   Stroke:   Hypertensive L thalamic ICH w/ IVH, cytotoxic edema and left to right midline shift brain herniation  Code Stroke CT head 1/4 L thalamic ICH w/ IVH. Mild Small vessel disease.   Repeat CT head 1/5 stable L thalamic and PLIC hemorrhage w/ edema. Increasing mass effect w/ partial effacement  L lat ventricle and 68mm midline shift. Stable IVH  Repeat CT head 1/6 unchanged  2D Echo EF 65-70%. No source of embolus    LDL 120    HgbA1c 6.6  Lovenox 40 mg sq daily for VTE prophy  No antithrombotic prior to admission, now on No antithrombotic given hmg  Therapy recommendations:  CIR. Consult done. ? Family support  Disposition:  pending   Medically ready for transition to next level of care  Hypertensive Urgency  SBP > 200s on arrival  On Cleviprex  Home meds:  Enalapril 10, lisinopril 10 . Off cleviprex . Prn labetalol  . Increase SBP goal < 180 . Long-term BP goal normotensive  Diabetes type II   Home meds:  Metformin 500 bid  HgbA1c 6.6, goal < 7.0  CBGs Recent Labs    07/29/19 0744 07/29/19 1153 07/29/19 1632  GLUCAP 133* 171* 296*      SSI  Dysphagia . Secondary to stroke . MBSS cleared for D1 honey thick . Speech on board . Grandson, POA, does not think she would want a FT  Other Stroke Risk Factors  Advanced age  Coronary artery disease  Other Active Problems  Hyponatremia 132->134 - check labs in am  Hyperkalemia, resolved 74.9->4.8 - check labs in am  Hospital day # 3  Continue ongoing therapies.  Mobilize out of bed.  Transfer to skilled nursing facility to the ED for rehab in  the next couple of days when bed available  [name] Contras, MD Medical Director [place] Pager: [phone] 07/29/2019 4:45 PM   To contact Stroke Continuity provider, please refer to [email or web address] After hours, contact General Neurology

## 2019-07-29 LAB — GLUCOSE, CAPILLARY
Glucose-Capillary: 108 mg/dL — ABNORMAL HIGH (ref 70–99)
Glucose-Capillary: 133 mg/dL — ABNORMAL HIGH (ref 70–99)
Glucose-Capillary: 168 mg/dL — ABNORMAL HIGH (ref 70–99)
Glucose-Capillary: 171 mg/dL — ABNORMAL HIGH (ref 70–99)
Glucose-Capillary: 260 mg/dL — ABNORMAL HIGH (ref 70–99)
Glucose-Capillary: 296 mg/dL — ABNORMAL HIGH (ref 70–99)

## 2019-07-29 MED ORDER — LABETALOL HCL 5 MG/ML IV SOLN
20.0000 mg | INTRAVENOUS | Status: DC | PRN
  Administered 2019-07-30: 16:00:00 20 mg via INTRAVENOUS
  Filled 2019-07-29: qty 4

## 2019-07-29 NOTE — Progress Notes (Signed)
Physical Therapy Treatment Patient Details Name: [name] [name] MRN: [id number] DOB: May 11, 1933 Today's Date: 07/29/2019    History of Present Illness Pt is an 86 y.o. female with a history of diabetes, hypertension, coronary artery disease who presented 07/26/19 with right-sided weakness and BP 200/99; CT head large thalamic hemorrhage on the left; repeat head CT 1/5 showed thalamic hemorrhage stable in size, with increased surrounding edema, including 5 mm midline shift    PT Comments    Focus of session was on bed mobility as pt was found to be lying in a large BM upon arrival. Attempted upright sitting at EOB during session; however, pt actively resisting movement of trunk into upright position. Pt's grandson present throughout session and asking questions about pt's progress. PT answered all of grandson's questions and he expressed his gratitude. Continue to recommend intensive post-acute therapy services prior to pt returning home with family support. PT will continue to follow pt acutely to progress mobility as tolerated per PT POC.   Follow Up Recommendations  SNF     Equipment Recommendations  Other (comment)(defer to next venue of care)    Recommendations for Other Services       Precautions / Restrictions Precautions Precautions: Fall Restrictions Weight Bearing Restrictions: No    Mobility  Bed Mobility Overal bed mobility: Needs Assistance Bed Mobility: Rolling;Supine to Sit Rolling: Max assist Sidelying to sit: Max assist;+2 for physical assistance       General bed mobility comments: attempted sitting upright at EOB towards pt's R side, max A for bilateral LEs off of bed; however, attempted trunk elevation and pt actively resisting movement; pt then noted to have had a BM and rolled bilaterally with max A for pericare and clean up  Transfers                    Ambulation/Gait                 Stairs             Wheelchair  Mobility    Modified Rankin (Stroke Patients Only) Modified Rankin (Stroke Patients Only) Pre-Morbid Rankin Score: No symptoms Modified Rankin: Severe disability     Balance                                            Cognition Arousal/Alertness: Awake/alert Behavior During Therapy: Flat affect Overall Cognitive Status: Impaired/Different from baseline Area of Impairment: Following commands;Safety/judgement;Problem solving                       Following Commands: Follows one step commands inconsistently;Follows one step commands with increased time Safety/Judgement: Decreased awareness of safety;Decreased awareness of deficits   Problem Solving: Slow processing;Decreased initiation;Difficulty sequencing;Requires verbal cues;Requires tactile cues        Exercises      General Comments        Pertinent Vitals/Pain Pain Assessment: No/denies pain Faces Pain Scale: No hurt    Home Living                      Prior Function            PT Goals (current goals can now be found in the care plan section) Acute Rehab PT Goals PT Goal Formulation: Patient unable to participate in goal setting Time For Goal  Achievement: 08/10/19 Potential to Achieve Goals: Fair Progress towards PT goals: Progressing toward goals    Frequency    Min 2X/week      PT Plan Current plan remains appropriate;Frequency needs to be updated    Co-evaluation              AM-PAC PT "6 Clicks" Mobility   Outcome Measure  Help needed turning from your back to your side while in a flat bed without using bedrails?: Total Help needed moving from lying on your back to sitting on the side of a flat bed without using bedrails?: Total Help needed moving to and from a bed to a chair (including a wheelchair)?: Total Help needed standing up from a chair using your arms (e.g., wheelchair or bedside chair)?: Total Help needed to walk in hospital room?:  Total Help needed climbing 3-5 steps with a railing? : Total 6 Click Score: 6    End of Session   Activity Tolerance: Patient tolerated treatment well Patient left: in bed;with call bell/phone within reach;with bed alarm set;with family/visitor present Nurse Communication: Mobility status PT Visit Diagnosis: Hemiplegia and hemiparesis Hemiplegia - Right/Left: Right Hemiplegia - dominant/non-dominant: Dominant Hemiplegia - caused by: Nontraumatic SAH     Time: 1352-1420 PT Time Calculation (min) (ACUTE ONLY): 28 min  Charges:  $Therapeutic Activity: 23-37 mins                     Arletta Bale, DPT  Acute Rehabilitation Services Pager [phone] Office [phone]     Alessandra Bevels Fatma Rutten 07/29/2019, 3:19 PM

## 2019-07-29 NOTE — Progress Notes (Signed)
Inpatient Rehabilitation-Admissions Coordinator   Met with pt bedside as follow up form PM&R consult. With pt's permission I spoke with her grandson, Legrand Como, to gather information about PLOF and determine DC support. Per Legrand Como, pt was Mod I in ambulation and ADLs PTA and lived with her daughter Peter Congo. Per Legrand Como, the pt will not have sufficient physical support at DC and will need SNF for post acute rehab. Legrand Como is in agreement for pursuing SNF placement at this time. I have contacted TOC regarding need for SNF.   AC will sign off.   Please call if questions.   Raechel Ache, OTR/L  Rehab Admissions Coordinator  [phone] 07/29/2019 12:03 PM

## 2019-07-29 NOTE — Plan of Care (Signed)
  Problem: Health Behavior/Discharge Planning: Goal: Ability to manage health-related needs will improve Outcome: Progressing   Problem: Nutrition: Goal: Dietary intake will improve Outcome: Progressing

## 2019-07-29 NOTE — Progress Notes (Signed)
  Speech Language Pathology Treatment: Dysphagia;Cognitive-Linquistic  Patient Details Name: [name] [name] MRN: [id number] DOB: 08/27/1932 Today's Date: 07/29/2019 Time: 7829-5621 SLP Time Calculation (min) (ACUTE ONLY): 18 min  Assessment / Plan / Recommendation Clinical Impression  Pt was seen for skilled ST targeting dysphagia and aphasia/dysarthria.  Pt was encountered awake/alert and she was agreeable to ST tx session.  Pt consumed trials of honey-thick liquid and puree.  Given minimal assistance, pt was able to drink from the cup/straw and she was noted to be impulsive, taking multiple large sips of liquid in a row despite verbal and tactile cues.  Observed R anterior labial spillage with cup sips of liquid.  Pt was able to achieve better labial closure around the straw and no anterior loss was observed. A delayed throat clear was observed x1 following serial cup sips of honey-thick liquid. Pt with prolonged AP transport of pureed solids, but no significant oral residue or R buccal pocketing was observed.  Additionally, no clinical s/sx of aspiration were observed with puree trials.    Pt was noted to have low vocal intensity with moderate dysarthria throughout this tx session.  Speech was noted to be most intelligible at the word level (approximately 70%), decreasing with short phrases or sentences (approximately 30%).  Cues for slowed rate of speech and over articulation mildly increase speech intelligibility.  Pt completed multiple expressive/receptive language tasks including confrontational naming, yes/no questions, and following 1-step commands.  Pt named basic items in the room with 4/5 (80%) accuracy with one verbal perseveration noted.  Accuracy increased to 5/5 given phonemic cues.  Pt answered yes/no questions with 8/8 accuracy independently.  She exhibited the most difficulty following one step commands, successfully completing 2/6 independently.  She benefited from visual cues.   Recommend additional ST (Inpatient Rehab) targeting dysphagia and cognitive-linguistic deficits at time of discharge.  SLP will continue to f/u acutely per POC.    HPI HPI: Pt is an 86 yo female presenting with R weakness, found to have large L thalamic hemorrhage. PMH: DM, HTN, CAD      SLP Plan  Continue with current plan of care       Recommendations  Diet recommendations: Dysphagia 1 (puree);Honey-thick liquid Liquids provided via: Cup;Straw Medication Administration: Crushed with puree Supervision: Staff to assist with self feeding;Full supervision/cueing for compensatory strategies Compensations: Minimize environmental distractions;Slow rate;Small sips/bites;Lingual sweep for clearance of pocketing;Monitor for anterior loss;Other (Comment)(one sip at a time ) Postural Changes and/or Swallow Maneuvers: Seated upright 90 degrees                Oral Care Recommendations: Oral care QID;Staff/trained caregiver to provide oral care Follow up Recommendations: Inpatient Rehab SLP Visit Diagnosis: Dysphagia, oropharyngeal phase (R13.12) Plan: Continue with current plan of care       GO               [name] [name] M.S., CCC-SLP Acute Rehabilitation Services Office: [phone]  [name] [name] [facility] 07/29/2019, 2:47 PM

## 2019-07-29 NOTE — NC FL2 (Addendum)
[place] MEDICAID FL2 LEVEL OF CARE SCREENING TOOL     IDENTIFICATION  Patient Name: [name] [name] Birthdate: May 05, 1933 Sex: female Admission Date (Current Location): 07/26/2019  [facility] and [place] Number:  Producer, television/film/video and Address:  [facility] [place], [address] [place], [place] [address]      Provider Number: [id number]  Attending Physician Name and Address:  Micki Riley, MD  Relative Name and Phone Number:  Eldridge Abrahams ( grandson) [phone]    Current Level of Care: Hospital Recommended Level of Care: Skilled Nursing Facility Prior Approval Number:    Date Approved/Denied:   PASRR Number: [id number]  Discharge Plan: SNF    Current Diagnoses: Patient Active Problem List   Diagnosis Date Noted   Hyperkalemia    Controlled type 2 diabetes mellitus with hyperglycemia, without long-term current use of insulin (HCC)    Hyponatremia    Cytotoxic brain edema (HCC) 07/27/2019   Brain herniation (HCC) 07/27/2019   IVH (intraventricular hemorrhage) (HCC) 07/26/2019    Orientation RESPIRATION BLADDER Height & Weight     Self, Place(Expressive aphasic, uses gestures)  Normal(room air) Continent Weight:   Height:     BEHAVIORAL SYMPTOMS/MOOD NEUROLOGICAL BOWEL NUTRITION STATUS      Continent Diet(Dys 1 ( puree) HOney thick liquids)  AMBULATORY STATUS COMMUNICATION OF NEEDS Skin   Extensive Assist Non-Verbally(expressive aphasia , uses gestures) (moisture perineum barrier cream)                       Personal Care Assistance Level of Assistance  Bathing, Feeding, Dressing Bathing Assistance: Maximum assistance Feeding assistance: Limited assistance Dressing Assistance: Maximum assistance     Functional Limitations Info  Speech     Speech Info: Impaired(Expressive aphasia)    SPECIAL CARE FACTORS FREQUENCY  PT (By licensed PT), OT (By licensed OT), Speech therapy     PT Frequency: five times a week OT  Frequency: five times a week     Speech Therapy Frequency: five times a week      Contractures Contractures Info: Not present    Additional Factors Info  Code Status, Allergies, Insulin Sliding Scale Code Status Info: full Allergies Info: constrast Media Iodinated Diagnostic Agents)   Insulin Sliding Scale Info: Insulin Novolog 0 to 15 units SQ every 4 hours       Current Medications (07/29/2019):  This is the current hospital active medication list Current Facility-Administered Medications  Medication Dose Route Frequency Provider Last Rate Last Admin    stroke: mapping our early stages of recovery book   Does not apply Once Layne Benton, NP       acetaminophen (TYLENOL) tablet 650 mg  650 mg Oral Q4H PRN Layne Benton, NP   650 mg at 07/28/19 1755   Or   acetaminophen (TYLENOL) 160 MG/5ML solution 650 mg  650 mg Per Tube Q4H PRN Layne Benton, NP       Or   acetaminophen (TYLENOL) suppository 650 mg  650 mg Rectal Q4H PRN Layne Benton, NP       chlorhexidine (PERIDEX) 0.12 % solution 15 mL  15 mL Mouth Rinse BID Annie Main L, NP   15 mL at 07/29/19 1042   Chlorhexidine Gluconate Cloth 2 % PADS 6 each  6 each Topical Daily Layne Benton, NP   6 each at 07/28/19 1215   enoxaparin (LOVENOX) injection 40 mg  40 mg Subcutaneous Q24H  Donzetta Starch, NP   40 mg at 07/29/19 1042   insulin aspart (novoLOG) injection 0-15 Units  0-15 Units Subcutaneous Q4H Donzetta Starch, NP   2 Units at 07/29/19 1610   labetalol (NORMODYNE) injection 20 mg  20 mg Intravenous Q2H PRN Donzetta Starch, NP       lisinopril (ZESTRIL) tablet 10 mg  10 mg Oral Daily Burnetta Sabin L, NP   10 mg at 07/29/19 1042   MEDLINE mouth rinse  15 mL Mouth Rinse q12n4p Donzetta Starch, NP   15 mL at 07/28/19 1600   Resource ThickenUp Clear   Oral PRN Donzetta Starch, NP       senna-docusate (Senokot-S) tablet 1 tablet  1 tablet Oral BID Donzetta Starch, NP   1 tablet at 07/29/19 1042     Discharge Medications: Please  see discharge summary for a list of discharge medications.  Relevant Imaging Results:  Relevant Lab Results:   Additional Information SSI [id number]  Wile, Edson Snowball, RN   I have personally obtained history,examined this patient, reviewed notes, independently viewed imaging studies, participated in medical decision making and plan of care.ROS completed by me personally and pertinent positives fully documented  I have made any additions or clarifications directly to the above note. Agree with note above.    Antony Contras, MD Medical Director [facility] Pager: [phone] 07/30/2019 1:20 PM

## 2019-07-29 NOTE — Social Work (Signed)
Pt aware that pt family prefers SNF at this time.  Pt family preference is [facility] area. TOC team continuing to follow and will f/u with family.   Octavio Graves, MSW, LCSWA [place] Clinical Social Work

## 2019-07-30 LAB — CBC
HCT: 43.5 % (ref 36.0–46.0)
Hemoglobin: 14.3 g/dL (ref 12.0–15.0)
MCH: 30.2 pg (ref 26.0–34.0)
MCHC: 32.9 g/dL (ref 30.0–36.0)
MCV: 91.8 fL (ref 80.0–100.0)
Platelets: 211 10*3/uL (ref 150–400)
RBC: 4.74 MIL/uL (ref 3.87–5.11)
RDW: 13.2 % (ref 11.5–15.5)
WBC: 10.3 10*3/uL (ref 4.0–10.5)
nRBC: 0 % (ref 0.0–0.2)

## 2019-07-30 LAB — BASIC METABOLIC PANEL
Anion gap: 10 (ref 5–15)
BUN: 21 mg/dL (ref 8–23)
CO2: 24 mmol/L (ref 22–32)
Calcium: 8.4 mg/dL — ABNORMAL LOW (ref 8.9–10.3)
Chloride: 105 mmol/L (ref 98–111)
Creatinine, Ser: 0.85 mg/dL (ref 0.44–1.00)
GFR calc Af Amer: >60 mL/min (ref >60)
GFR calc non Af Amer: >60 mL/min (ref >60)
Glucose, Bld: 177 mg/dL — ABNORMAL HIGH (ref 70–99)
Potassium: 3.7 mmol/L (ref 3.5–5.1)
Sodium: 139 mmol/L (ref 135–145)

## 2019-07-30 LAB — GLUCOSE, CAPILLARY
Glucose-Capillary: 161 mg/dL — ABNORMAL HIGH (ref 70–99)
Glucose-Capillary: 173 mg/dL — ABNORMAL HIGH (ref 70–99)
Glucose-Capillary: 322 mg/dL — ABNORMAL HIGH (ref 70–99)
Glucose-Capillary: 257 mg/dL — ABNORMAL HIGH (ref 70–99)
Glucose-Capillary: 278 mg/dL — ABNORMAL HIGH (ref 70–99)

## 2019-07-30 MED ORDER — METFORMIN HCL 500 MG PO TABS
500.0000 mg | ORAL_TABLET | Freq: Two times a day (BID) | ORAL | Status: DC
  Administered 2019-07-30 – 2019-08-03 (×8): 500 mg via ORAL
  Filled 2019-07-30 (×8): qty 1

## 2019-07-30 MED ORDER — AMLODIPINE BESYLATE 5 MG PO TABS
5.0000 mg | ORAL_TABLET | Freq: Every day | ORAL | Status: DC
  Administered 2019-07-30 – 2019-07-31 (×2): 5 mg via ORAL
  Filled 2019-07-30 (×2): qty 1

## 2019-07-30 MED ORDER — INSULIN ASPART 100 UNIT/ML ~~LOC~~ SOLN
0.0000 [IU] | Freq: Three times a day (TID) | SUBCUTANEOUS | Status: DC
  Administered 2019-07-30: 13:00:00 11 [IU] via SUBCUTANEOUS
  Administered 2019-07-30 (×2): 8 [IU] via SUBCUTANEOUS
  Administered 2019-07-31: 23:00:00 5 [IU] via SUBCUTANEOUS
  Administered 2019-07-31: 09:00:00 8 [IU] via SUBCUTANEOUS
  Administered 2019-07-31: 17:00:00 3 [IU] via SUBCUTANEOUS
  Administered 2019-07-31: 13:00:00 8 [IU] via SUBCUTANEOUS
  Administered 2019-08-01: 13:00:00 11 [IU] via SUBCUTANEOUS
  Administered 2019-08-01: 17:00:00 2 [IU] via SUBCUTANEOUS
  Administered 2019-08-01: 09:00:00 3 [IU] via SUBCUTANEOUS
  Administered 2019-08-01: 5 [IU] via SUBCUTANEOUS
  Administered 2019-08-02: 13:00:00 3 [IU] via SUBCUTANEOUS
  Administered 2019-08-02: 08:00:00 2 [IU] via SUBCUTANEOUS
  Administered 2019-08-02: 18:00:00 8 [IU] via SUBCUTANEOUS
  Administered 2019-08-03: 08:00:00 2 [IU] via SUBCUTANEOUS

## 2019-07-30 MED ORDER — ONDANSETRON HCL 4 MG/2ML IJ SOLN
4.0000 mg | Freq: Once | INTRAMUSCULAR | Status: DC
  Filled 2019-07-30: qty 2

## 2019-07-30 NOTE — TOC Initial Note (Signed)
Transition of Care [facility]) - Initial/Assessment Note    Patient Details  Name: [name] [name] MRN: [id number] Date of Birth: 1933/05/17  Transition of Care [facility]) CM/SW Contact:    Doy Hutching, LCSWA Phone Number: 07/30/2019, 2:47 PM  Clinical Narrative:                 CSW spoke with pt grandson Kathlene November via telephone. CSW introduced self, role, reason for visit. Pt from home. This was sudden and Kathlene November has been trying to sort it all out. He is appreciative of all guidance. We discussed referral for SNF. Preference is for [facility] SNFs due to proximity to pt family.   Kathlene November requested offers be sent to his email [email or web address]. Kathlene November thinks his family would choose [facility] but would like to discuss.   [facility] has a bed available on Monday- will need new COVID on Sunday. Will send message to weekend [facility] to make this request.   Expected Discharge Plan: Skilled Nursing Facility Barriers to Discharge: Continued Medical Work up   Patient Goals and CMS Choice Patient states their goals for this hospitalization and ongoing recovery are:: for him to get the help she needs CMS Medicare.gov Compare Post Acute Care list provided to:: Patient Represenative (must comment)(pt grandson Kathlene November) Choice offered to / list presented to : (pt grandson Kathlene November)  Expected Discharge Plan and Services Expected Discharge Plan: Skilled Nursing Facility In-house Referral: Clinical Social Work Discharge Planning Services: CM Consult Post Acute Care Choice: Skilled Nursing Facility Living arrangements for the past 2 months: Single Family Home   Prior Living Arrangements/Services Living arrangements for the past 2 months: Single Family Home Lives with:: Self Patient language and need for interpreter reviewed:: Yes(no needs) Do you feel safe going back to the place where you live?: Yes      Need for Family Participation in Patient Care: Yes (Comment)(assistance with decision  making/ADL/IADLs) Care giver support system in place?: Yes (comment)(pt relatives. main point of contact is her grandson) Current home services: DME Criminal Activity/Legal Involvement Pertinent to Current Situation/Hospitalization: No - Comment as needed   Permission Sought/Granted Permission sought to share information with : Family Supports Permission granted to share information with : No(due to pt aphasia/cognition)  Share Information with NAME: Miguel Rota  Permission granted to share info w AGENCY: SNFs  Permission granted to share info w Relationship: grandson  Permission granted to share info w Contact Information: [phone]  Emotional Assessment Appearance:: Appears stated age Attitude/Demeanor/Rapport: (telephonic assessment with grandson) Affect (typically observed): (telephonic assessment with grandson) Orientation: : Oriented to Self, Fluctuating Orientation (Suspected and/or reported Sundowners) Alcohol / Substance Use: Not Applicable Psych Involvement: No (comment)  Admission diagnosis:  Intracranial hemorrhage (HCC) [I62.9] ICH (intracerebral hemorrhage) (HCC) [I61.9] Patient Active Problem List   Diagnosis Date Noted  . Hyperkalemia   . Controlled type 2 diabetes mellitus with hyperglycemia, without long-term current use of insulin (HCC)   . Hyponatremia   . 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 Brain herniation (HCC) 07/27/2019  . IVH (intraventricular hemorrhage) (HCC) 07/26/2019   PCP:  Ernestine Conrad, MD Pharmacy:   [facility] [phone] - [place], [place] - [address] AT [facility] [address] [place] [address] Phone: [phone] Fax: [phone]   Readmission Risk Interventions Readmission Risk Prevention Plan 07/30/2019  Post Dischage Appt Not Complete  Appt Comments plan for SNF  Medication Screening Complete  Transportation Screening Complete  Some recent data  might be hidden

## 2019-07-30 NOTE — Social Work (Signed)
HIPAA compliant message left on pt grandson voicemail. Eldridge [phone].  CSW continuing to follow for support with disposition when medically appropriate.  Octavio Graves, MSW, [facility] Clinical Social Work [phone]

## 2019-07-30 NOTE — Progress Notes (Signed)
Occupational Therapy Treatment Patient Details Name: [name] [name] MRN: [id number] DOB: Jun 30, 1933 Today's Date: 07/30/2019    History of present illness Pt is an 86 y.o. female with a history of diabetes, hypertension, coronary artery disease who presented 07/26/19 with right-sided weakness and BP 200/99; CT head large thalamic hemorrhage on the left; repeat head CT 1/5 showed thalamic hemorrhage stable in size, with increased surrounding edema, including 5 mm midline shift   OT comments  Pt. Seen for skilled OT treatment session.  Focus of session HEP for BUEs to increase strength for functional use with progression of adls as able. Pt. With some active participation noted with use of LUE during ROM.  Shook head in "no" direction when attempting to have RUE movement.    Follow Up Recommendations  CIR    Equipment Recommendations  Other (comment)    Recommendations for Other Services Rehab consult    Precautions / Restrictions Precautions Precautions: Fall Restrictions Weight Bearing Restrictions: No       Mobility Bed Mobility  Total a with boost function to reposition and move pt. Up in bed                  Transfers                      Balance                                           ADL either performed or assessed with clinical judgement   ADL                                               Vision       Perception     Praxis      Cognition Arousal/Alertness: Awake/alert Behavior During Therapy: Flat affect Overall Cognitive Status: Impaired/Different from baseline                                          Exercises General Exercises - Upper Extremity Shoulder Flexion: PROM;Both;5 reps;Supine Shoulder Extension: PROM;Both;5 reps;Supine Elbow Flexion: PROM;Both;5 reps;Supine Elbow Extension: PROM;Both;5 reps;Supine Wrist Flexion: PROM;Both;5 reps;Supine Wrist Extension:  PROM;Both;5 reps;Supine   Shoulder Instructions       General Comments  continued to pull blanket off of her L leg.  When asked "are you hot" she shook her head in "yes" direction.  Assisted with lowering the thermostat and explained to pt. That I had assisted with trying to make the room more comfortable for her    Pertinent Vitals/ Pain       Pain Assessment: No/denies pain  Home Living                                          Prior Functioning/Environment              Frequency  Min 2X/week        Progress Toward Goals  OT Goals(current goals can now be found in the care plan section)  Progress towards OT goals:  Progressing toward goals     Plan      Co-evaluation                 AM-PAC OT "6 Clicks" Daily Activity     Outcome Measure   Help from another person eating meals?: Total Help from another person taking care of personal grooming?: A Lot Help from another person toileting, which includes using toliet, bedpan, or urinal?: Total Help from another person bathing (including washing, rinsing, drying)?: Total Help from another person to put on and taking off regular upper body clothing?: Total Help from another person to put on and taking off regular lower body clothing?: Total 6 Click Score: 7    End of Session    OT Visit Diagnosis: Unsteadiness on feet (R26.81);Muscle weakness (generalized) (M62.81);Hemiplegia and hemiparesis Hemiplegia - Right/Left: Right Hemiplegia - dominant/non-dominant: Dominant Hemiplegia - caused by: Nontraumatic intracerebral hemorrhage   Activity Tolerance Patient tolerated treatment well   Patient Left in bed;with call bell/phone within reach;with bed alarm set   Nurse Communication          Time: 0903-0149 OT Time Calculation (min): 9 min  Charges: OT General Charges $OT Visit: 1 Visit OT Treatments $Therapeutic Exercise: 8-22 mins  Boneta Lucks, COTA/L Acute  Rehabilitation [phone]   Robet Leu 07/30/2019, 12:52 PM

## 2019-07-30 NOTE — Progress Notes (Signed)
STROKE TEAM PROGRESS NOTE   INTERVAL HISTORY Patient remains aphasic and speech is mostly garbled and gibberish and difficult to understand.  She follows simple midline and one-step commands.  Vital signs are stable.  No neurological changes. A.m. labs show a slightly low calcium but otherwise unremarkable BMP.  CBC is normal Vitals:   07/29/19 1715 07/29/19 2003 07/30/19 0421 07/30/19 1336  BP: (!) 165/70 (!) 137/43 (!) 141/65 (!) 181/68  Pulse: 71 78 74 88  Resp: 18 19 17 16   Temp: 98.7 F (37.1 C) 99.5 F (37.5 C) 99.2 F (37.3 C) 99.1 F (37.3 C)  TempSrc: Oral Oral Oral Oral  SpO2: 96% 94%  98%    CBC:  Recent Labs  Lab 07/26/19 2045 07/26/19 2051 07/30/19 0329  WBC 6.9  --  10.3  NEUTROABS 6.1  --   --   HGB 15.5* 16.3* 14.3  HCT 45.8 48.0* 43.5  MCV 91.4  --  91.8  PLT 208  --  532    Basic Metabolic Panel:  Recent Labs  Lab 07/26/19 2141 07/30/19 0329  NA 134* 139  K 4.8 3.7  CL 102 105  CO2 18* 24  GLUCOSE 262* 177*  BUN 11 21  CREATININE 0.73 0.85  CALCIUM 8.9 8.4*    IMAGING past 48 hours DG Swallowing Func-Speech Pathology  Result Date: 07/28/2019 Objective Swallowing Evaluation: Type of Study: MBS-Modified Barium Swallow Study  Patient Details Name: [name] [name] MRN: [id number] Date of Birth: [birth year] Today's Date: 07/28/2019 Time: SLP Start Time (ACUTE ONLY): 1962 -SLP Stop Time (ACUTE ONLY): 1450 SLP Time Calculation (min) (ACUTE ONLY): 18 min Past Medical History: Past Medical History: Diagnosis Date . Arthritis  . Coronary artery disease   angina . Diabetes mellitus without complication ([place])  . Hypertension  Past Surgical History: Past Surgical History: Procedure Laterality Date . APPENDECTOMY   . CHOLECYSTECTOMY   HPI: Pt is an 86 yo female presenting with R weakness, found to have large L thalamic hemorrhage. PMH: DM, HTN, CAD  Subjective: pt more alert today Assessment / Plan / Recommendation CHL IP CLINICAL IMPRESSIONS 07/28/2019 Clinical  Impression Pt has a moderate oropharyngeal dysphagia as a result of CN VII weakness and reduced coordination. She has trouble achieving full labial seal and oral clearance on her R side. Mild anterior spillage occurs and pocketing increases as boluses become more solid. She has trouble following commands with attempted strategies, but she did attempt a lingual sweep that cleared most of the buccal residue after repeated efforts. Pt is capable of achieveing good laryngeal vestibule closure and pharyngeal clearance, but aspiration occurs with thin and nectar thick liquids when she has trouble coordinating her swallow. At times this means a bolus spills back to her pharynx prematurely. Other times it is because she is trying to manage a subsequent bolus orally and hasn't swallowed to clear her pharynx yet. This also happened when she was trying to clear oral residue. Honey thick liquids and purees give allow her to properly close her airway before swallowing, even if her timing is off. Recommend starting Dys 1 diet and honey thick liquids. Full supervision is recommended given impulsive rate of intake and reduced awareness of pocketing. Will continue to follow acutely. SLP Visit Diagnosis Dysphagia, oropharyngeal phase (R13.12) Attention and concentration deficit following -- Frontal lobe and executive function deficit following -- Impact on safety and function Mild aspiration risk;Moderate aspiration risk   CHL IP TREATMENT RECOMMENDATION 07/28/2019 Treatment Recommendations Therapy as outlined in  treatment plan below   Prognosis 07/28/2019 Prognosis for Safe Diet Advancement Good Barriers to Reach Goals Cognitive deficits;Language deficits Barriers/Prognosis Comment -- CHL IP DIET RECOMMENDATION 07/28/2019 SLP Diet Recommendations Dysphagia 1 (Puree) solids;Honey thick liquids Liquid Administration via Cup;Straw Medication Administration Crushed with puree Compensations Minimize environmental distractions;Slow rate;Small  sips/bites;Lingual sweep for clearance of pocketing;Monitor for anterior loss;Other (Comment) Postural Changes Remain semi-upright after after feeds/meals (Comment);Seated upright at 90 degrees   CHL IP OTHER RECOMMENDATIONS 07/28/2019 Recommended Consults -- Oral Care Recommendations Oral care BID Other Recommendations Order thickener from pharmacy;Prohibited food (jello, ice cream, thin soups);Remove water pitcher   CHL IP FOLLOW UP RECOMMENDATIONS 07/28/2019 Follow up Recommendations Inpatient Rehab   CHL IP FREQUENCY AND DURATION 07/28/2019 Speech Therapy Frequency (ACUTE ONLY) min 2x/week Treatment Duration 2 weeks      CHL IP ORAL PHASE 07/28/2019 Oral Phase Impaired Oral - Pudding Teaspoon -- Oral - Pudding Cup -- Oral - Honey Teaspoon -- Oral - Honey Cup Right anterior bolus loss;Decreased bolus cohesion;Delayed oral transit Oral - Nectar Teaspoon -- Oral - Nectar Cup Right anterior bolus loss;Decreased bolus cohesion;Delayed oral transit Oral - Nectar Straw Right anterior bolus loss;Decreased bolus cohesion;Delayed oral transit Oral - Thin Teaspoon -- Oral - Thin Cup Right anterior bolus loss;Decreased bolus cohesion;Delayed oral transit Oral - Thin Straw Right anterior bolus loss;Decreased bolus cohesion;Delayed oral transit Oral - Puree Right anterior bolus loss;Decreased bolus cohesion;Delayed oral transit Oral - Mech Soft Right anterior bolus loss;Decreased bolus cohesion;Delayed oral transit;Right pocketing in lateral sulci Oral - Regular -- Oral - Multi-Consistency -- Oral - Pill -- Oral Phase - Comment --  CHL IP PHARYNGEAL PHASE 07/28/2019 Pharyngeal Phase Impaired Pharyngeal- Pudding Teaspoon -- Pharyngeal -- Pharyngeal- Pudding Cup -- Pharyngeal -- Pharyngeal- Honey Teaspoon -- Pharyngeal -- Pharyngeal- Honey Cup Delayed swallow initiation-pyriform sinuses Pharyngeal -- Pharyngeal- Nectar Teaspoon -- Pharyngeal -- Pharyngeal- Nectar Cup Delayed swallow initiation-pyriform sinuses;Penetration/Aspiration  during swallow Pharyngeal Material enters airway, passes BELOW cords without attempt by patient to eject out (silent aspiration) Pharyngeal- Nectar Straw Delayed swallow initiation-pyriform sinuses;Penetration/Aspiration during swallow Pharyngeal Material enters airway, passes BELOW cords and not ejected out despite cough attempt by patient Pharyngeal- Thin Teaspoon -- Pharyngeal -- Pharyngeal- Thin Cup Delayed swallow initiation-pyriform sinuses;Penetration/Aspiration during swallow Pharyngeal Material enters airway, passes BELOW cords and not ejected out despite cough attempt by patient Pharyngeal- Thin Straw Delayed swallow initiation-pyriform sinuses;Penetration/Aspiration during swallow Pharyngeal Material enters airway, passes BELOW cords and not ejected out despite cough attempt by patient Pharyngeal- Puree WFL Pharyngeal -- Pharyngeal- Mechanical Soft WFL Pharyngeal -- Pharyngeal- Regular -- Pharyngeal -- Pharyngeal- Multi-consistency -- Pharyngeal -- Pharyngeal- Pill -- Pharyngeal -- Pharyngeal Comment --  CHL IP CERVICAL ESOPHAGEAL PHASE 07/28/2019 Cervical Esophageal Phase WFL Pudding Teaspoon -- Pudding Cup -- Honey Teaspoon -- Honey Cup -- Nectar Teaspoon -- Nectar Cup -- Nectar Straw -- Thin Teaspoon -- Thin Cup -- Thin Straw -- Puree -- Mechanical Soft -- Regular -- Multi-consistency -- Pill -- Cervical Esophageal Comment -- Mahala Menghini., M.A. CCC-SLP Acute Rehabilitation Services Pager [phone] Office [phone] 07/28/2019, 3:25 PM               PHYSICAL EXAM    Elderly African-American lady not in distress. . Afebrile. Head is nontraumatic. Neck is supple without bruit.    Cardiac exam no murmur or gallop. Lungs are clear to auscultation. Distal pulses are well felt. Neurological Exam :  Patient is awake and alert she is aphasic and speaks only occasional words and gibberish and  difficult to understand short sentences.  She follows only follow midline and occasional one-step commands on the  right.  She has left gaze preference and not able to look to the right past midline.  She does not blink to threat on either side.  There is right lower facial weakness.  Tongue midline.  She has significant right hemiplegia and will withdraw her right leg more than arm to painful stimuli.  She has purposeful antigravity movements on the left.  Right plantars upgoing left is downgoing.  Gait not tested.  ASSESSMENT/PLAN [name] [name] is a 86 y.o. female with history of HTN, DB, CAD presenting with R sided weakness.   Stroke:   Hypertensive L thalamic ICH w/ IVH, cytotoxic edema and left to right midline shift brain herniation  Code Stroke CT head 1/4 L thalamic ICH w/ IVH. Mild Small vessel disease.   Repeat CT head 1/5 stable hemorrhage w/ increasing surrounding edema. Increasing mass effect w/ partial effacement L lateral ventricle. Stable IVH  Repeat CT head 1/6 unchanged   2D Echo normal ejection fraction 65 to 70%.  No wall motion abnormality.   LDL 120   HgbA1c 6.6   Lovenox 40 mg sq daily for VTE prophylaxis  No antithrombotic prior to admission, now on No antithrombotic given hmg  Therapy recommendations:  CIR but not support at home ->SNF  Disposition:  pending   Medically ready for d/c when bed found - has bed on Monday, for COVID test Sunday  Hypertensive Urgency  SBP > 200s on arrival  Treated with Cleviprex, now off  Home meds:  Enalapril 10, lisinopril 10 . SBP goal < 180 . Resumed home lisinopril . Add norvasc 5 . Long-term BP goal normotensive  Diabetes type II   Home meds:  Metformin 500 bid  HgbA1c 6.6, goal < 7.0hy  CBGs Recent Labs    07/30/19 0421 07/30/19 0754 07/30/19 1140  GLUCAP 161* 173* 322*      SSI  Resume home metformin  Dysphagia . Secondary to stroke . Speech on board . MBSS cleared for D1 honey . Grandson, POA, does not think she would want a FT  Other Stroke Risk Factors  Advanced age  Coronary artery  disease  Other Active Problems  Hyponatremia, resoolved   Hyperkalemia, resolved   Hospital day # 4  Continue strict blood pressure control and ongoing therapies.  Await transfer to rehabilitation and skilled nursing facility next week.  We will obtain medical hospitalist consult and request to take over her care. Greater than 50% time during this 25-minute visit was spent in counseling and coordination of care discussion with care team Delia Heady, MD Medical Director [facility] Pager: [phone] 07/30/2019 2:43 PM   To contact Stroke Continuity provider, please refer to [email or web address]. After hours, contact General Neurology

## 2019-07-30 NOTE — Plan of Care (Signed)
  Problem: Nutrition: Goal: Risk of aspiration will decrease Outcome: Progressing Goal: Dietary intake will improve Outcome: Progressing   

## 2019-07-31 ENCOUNTER — Inpatient Hospital Stay (HOSPITAL_COMMUNITY): Payer: Medicare Other

## 2019-07-31 DIAGNOSIS — I629 Nontraumatic intracranial hemorrhage, unspecified: Secondary | ICD-10-CM

## 2019-07-31 DIAGNOSIS — I10 Essential (primary) hypertension: Secondary | ICD-10-CM | POA: Diagnosis present

## 2019-07-31 DIAGNOSIS — E1159 Type 2 diabetes mellitus with other circulatory complications: Secondary | ICD-10-CM | POA: Diagnosis present

## 2019-07-31 LAB — CBC
HCT: 44.5 % (ref 36.0–46.0)
Hemoglobin: 14.3 g/dL (ref 12.0–15.0)
MCH: 30.2 pg (ref 26.0–34.0)
MCHC: 32.1 g/dL (ref 30.0–36.0)
MCV: 94.1 fL (ref 80.0–100.0)
Platelets: 206 10*3/uL (ref 150–400)
RBC: 4.73 MIL/uL (ref 3.87–5.11)
RDW: 13.4 % (ref 11.5–15.5)
WBC: 9.1 10*3/uL (ref 4.0–10.5)
nRBC: 0 % (ref 0.0–0.2)

## 2019-07-31 LAB — BASIC METABOLIC PANEL
Anion gap: 10 (ref 5–15)
BUN: 23 mg/dL (ref 8–23)
CO2: 26 mmol/L (ref 22–32)
Calcium: 8.8 mg/dL — ABNORMAL LOW (ref 8.9–10.3)
Chloride: 110 mmol/L (ref 98–111)
Creatinine, Ser: 0.7 mg/dL (ref 0.44–1.00)
GFR calc Af Amer: >60 mL/min (ref >60)
GFR calc non Af Amer: >60 mL/min (ref >60)
Glucose, Bld: 165 mg/dL — ABNORMAL HIGH (ref 70–99)
Potassium: 3.7 mmol/L (ref 3.5–5.1)
Sodium: 146 mmol/L — ABNORMAL HIGH (ref 135–145)

## 2019-07-31 LAB — GLUCOSE, CAPILLARY
Glucose-Capillary: 294 mg/dL — ABNORMAL HIGH (ref 70–99)
Glucose-Capillary: 297 mg/dL — ABNORMAL HIGH (ref 70–99)
Glucose-Capillary: 145 mg/dL — ABNORMAL HIGH (ref 70–99)
Glucose-Capillary: 211 mg/dL — ABNORMAL HIGH (ref 70–99)

## 2019-07-31 LAB — SARS CORONAVIRUS 2 (TAT 6-24 HRS): SARS Coronavirus 2: NEGATIVE

## 2019-07-31 MED ORDER — SODIUM CHLORIDE 0.9 % IV SOLN: INTRAVENOUS | Status: DC

## 2019-07-31 MED ORDER — BISACODYL 10 MG RE SUPP: 10.0000 mg | Freq: Once | RECTAL | Status: DC

## 2019-07-31 MED ORDER — ONDANSETRON HCL 4 MG/2ML IJ SOLN: 4.0000 mg | Freq: Four times a day (QID) | INTRAMUSCULAR | Status: DC | PRN

## 2019-07-31 MED ORDER — LORAZEPAM 1 MG PO TABS
2.0000 mg | ORAL_TABLET | Freq: Once | ORAL | Status: AC | PRN
  Administered 2019-07-31: 14:00:00 2 mg via ORAL
  Filled 2019-07-31: qty 4

## 2019-07-31 MED ORDER — AMLODIPINE BESYLATE 10 MG PO TABS
10.0000 mg | ORAL_TABLET | Freq: Every day | ORAL | Status: DC
  Administered 2019-08-01 – 2019-08-02 (×2): 10 mg via ORAL
  Filled 2019-07-31 (×3): qty 1

## 2019-07-31 NOTE — Progress Notes (Signed)
PROGRESS NOTE    [name] [name]  INO:[id number] DOB: September 13, 1932 DOA: 07/26/2019 PCP: Ernestine Conrad, MD     Brief Narrative:  On 07/26/2019 the patient was found by family incapacitated and she presented to the ED with right-sided weakness.  CT scan showed left thalamic hemorrhage with intraventricular extension.  She was subsequently admitted to the ICU. NS eval was not deemed necessary.    New events last 24 hours / Subjective: Patient was slurred and largely unintelligible speech. Reports no complaints.   Assessment & Plan:   Principal Problem:   IVH (intraventricular hemorrhage) (HCC) Active Problems:   Cytotoxic brain edema (HCC)   Brain herniation (HCC)   Hyperkalemia   Controlled type 2 diabetes mellitus with hyperglycemia, without long-term current use of insulin (HCC)   Hyponatremia   Essential hypertension  IVH, brain herniation, brain edema Out of ICU, no worsening Appreciate Neuro PM&R outpatient SNF on D/c this week Appreciate PT/OT Fall precauations SLP, appreciate diet recs  DM 2 SSI Cont Metformin 500 mg bid  Hypertension Cont Norvasc 5 mg/d Cont Lisinopril 10 mg/d Labetalol prn if SBP >179, DBP >109  Hyponatremia Resolved, monitor BMP  Hyperkalemia Resolved, monitor BMP  DVT prophylaxis: Lovenox Code Status: Full Family Communication: No fam bedside Disposition Plan: SNF   Consultants:   Neuro  PM&R  Antimicrobials:  Anti-infectives (From admission, onward)   None       Objective: Vitals:   07/30/19 1336 07/30/19 1700 07/30/19 2021 07/31/19 0418  BP: (!) 181/68 (!) 158/67 (!) 145/55 (!) 145/61  Pulse: 88  75 65  Resp: 16  16 20   Temp: 99.1 F (37.3 C)  99.7 F (37.6 C) 97.6 F (36.4 C)  TempSrc: Oral  Oral   SpO2: 98%  94% 94%    Intake/Output Summary (Last 24 hours) at 07/31/2019 1020 Last data filed at 07/31/2019 0850 Gross per 24 hour  Intake 960 ml  Output --  Net 960 ml   Examination:  General exam: Appears  calm and comfortable  Respiratory system: Clear to auscultation. Respiratory effort normal. No respiratory distress. No conversational dyspnea.  Cardiovascular system: S1 & S2 heard, RRR. No pedal edema. Gastrointestinal system: Abdomen is nondistended, soft and nontender. Normal bowel sounds heard. Central nervous system: No movement noted of RUE and RLE, facial droop on R, slurred/unintelligible speech Skin: No rashes, lesions or ulcers on exposed skin  Psychiatry: Mood & affect appropriate.   Data Reviewed: I have personally reviewed following labs and imaging studies  CBC: Recent Labs  Lab 07/26/19 2045 07/26/19 2051 07/30/19 0329 07/31/19 0249  WBC 6.9  --  10.3 9.1  NEUTROABS 6.1  --   --   --   HGB 15.5* 16.3* 14.3 14.3  HCT 45.8 48.0* 43.5 44.5  MCV 91.4  --  91.8 94.1  PLT 208  --  211 206   Basic Metabolic Panel: Recent Labs  Lab 07/26/19 2051 07/26/19 2141 07/30/19 0329 07/31/19 0249  NA 132* 134* 139 146*  K 7.9* 4.8 3.7 3.7  CL 100 102 105 110  CO2  --  18* 24 26  GLUCOSE 227* 262* 177* 165*  BUN 15 11 21 23   CREATININE 0.60 0.73 0.85 0.70  CALCIUM  --  8.9 8.4* 8.8*   Liver Function Tests: Recent Labs  Lab 07/26/19 2141  AST 41  ALT 19  ALKPHOS 70  BILITOT 0.9  PROT 6.6  ALBUMIN 3.2*   Coagulation Profile: Recent Labs  Lab 07/26/19 2215  INR 1.1   CBG: Recent Labs  Lab 07/30/19 0754 07/30/19 1140 07/30/19 1522 07/30/19 2018 07/31/19 0808  GLUCAP 173* 322* 257* 278* 294*    Recent Results (from the past 240 hour(s))  SARS CORONAVIRUS 2 (TAT 6-24 HRS) Nasopharyngeal Nasopharyngeal Swab     Status: None   Collection Time: 07/27/19  5:53 AM   Specimen: Nasopharyngeal Swab  Result Value Ref Range Status   SARS Coronavirus 2 NEGATIVE NEGATIVE Final    Comment: (NOTE) SARS-CoV-2 target nucleic acids are NOT DETECTED. The SARS-CoV-2 RNA is generally detectable in upper and lower respiratory specimens during the acute phase of  infection. Negative results do not preclude SARS-CoV-2 infection, do not rule out co-infections with other pathogens, and should not be used as the sole basis for treatment or other patient management decisions. Negative results must be combined with clinical observations, patient history, and epidemiological information. The expected result is Negative. Fact Sheet for Patients: SugarRoll.be Fact Sheet for Healthcare Providers: https://www.woods-mathews.com/ This test is not yet approved or cleared by the [place] FDA and  has been authorized for detection and/or diagnosis of SARS-CoV-2 by FDA under an Emergency Use Authorization (EUA). This EUA will remain  in effect (meaning this test can be used) for the duration of the COVID-19 declaration under Section 56 4(b)(1) of the Act, 21 U.S.C. section 360bbb-3(b)(1), unless the authorization is terminated or revoked sooner. Performed at [facility], [place] [address] [place], [place] [address]   MRSA PCR Screening     Status: None   Collection Time: 07/27/19  6:29 AM   Specimen: Nasal Mucosa; Nasopharyngeal  Result Value Ref Range Status   MRSA by PCR NEGATIVE NEGATIVE Final    Comment:        The GeneXpert MRSA Assay (FDA approved for NASAL specimens only), is one component of a comprehensive MRSA colonization surveillance program. It is not intended to diagnose MRSA infection nor to guide or monitor treatment for MRSA infections. Performed at [facility], [place] [address] [place], [place] [address]       Radiology Studies: No results found.    Scheduled Meds: .  stroke: mapping our early stages of recovery book   Does not apply Once  . amLODipine  5 mg Oral Daily  . chlorhexidine  15 mL Mouth Rinse BID  . Chlorhexidine Gluconate Cloth  6 each Topical Daily  . enoxaparin (LOVENOX) injection  40 mg Subcutaneous Q24H  . insulin aspart  0-15 Units Subcutaneous TID  WC & HS  . lisinopril  10 mg Oral Daily  . mouth rinse  15 mL Mouth Rinse q12n4p  . metFORMIN  500 mg Oral BID WC  . ondansetron (ZOFRAN) IV  4 mg Intravenous Once  . senna-docusate  1 tablet Oral BID   Continuous Infusions:   LOS: 5 days   Time spent: 18 minutes   Shelda Pal, DO [facility] 07/31/2019, 10:20 AM   Available via Epic secure chat 7am-7pm After these hours, please refer to coverage provider listed on [email or web address]

## 2019-07-31 NOTE — Progress Notes (Signed)
STROKE TEAM PROGRESS NOTE   INTERVAL HISTORY No family at bedside. Pt lying in bed, severe dysarthria, able to follow simple commands. RN reported no bowel movement since admission. Pt on honey thick liquid, likes ensure. Able to eat 50% of the meal.   Vitals:   07/30/19 1336 07/30/19 1700 07/30/19 2021 07/31/19 0418  BP: (!) 181/68 (!) 158/67 (!) 145/55 (!) 145/61  Pulse: 88  75 65  Resp: 16  16 20   Temp: 99.1 F (37.3 C)  99.7 F (37.6 C) 97.6 F (36.4 C)  TempSrc: Oral  Oral   SpO2: 98%  94% 94%    CBC:  Recent Labs  Lab 07/26/19 2045 07/30/19 0329 07/31/19 0249  WBC 6.9 10.3 9.1  NEUTROABS 6.1  --   --   HGB 15.5* 14.3 14.3  HCT 45.8 43.5 44.5  MCV 91.4 91.8 94.1  PLT 208 211 206    Basic Metabolic Panel:  Recent Labs  Lab 07/30/19 0329 07/31/19 0249  NA 139 146*  K 3.7 3.7  CL 105 110  CO2 24 26  GLUCOSE 177* 165*  BUN 21 23  CREATININE 0.85 0.70  CALCIUM 8.4* 8.8*    IMAGING past 48 hours No results found.  PHYSICAL EXAM    Elderly African-American lady not in distress. . Afebrile. Head is nontraumatic. Neck is supple without bruit.    Cardiac exam no murmur or gallop. Lungs are clear to auscultation. Distal pulses are well felt. Neurological Exam :  Patient is awake and alert she is able to tell me her name, but not orientated to age, place and time. She is able to repeat sentences but severe dysarthria and difficult to be understood. Follows all simple commands on the left.  No significant gaze deviation, visual field full.  There is right lower facial weakness.  Tongue protrusion to the right.  She has significant right hemiplegia, no significant withdraw to painful stimuli.  She has purposeful antigravity movements on the left.  Right plantars equivocal and left is downgoing.  Gait not tested.  ASSESSMENT/PLAN [name] [name] is a 86 y.o. female with history of HTN, DB, CAD presenting with R sided weakness.   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 Mild Small vessel disease.   Repeat CT head 1/5 stable hemorrhage w/ increasing surrounding edema. Increasing mass effect w/ partial effacement L lateral ventricle. Stable IVH  Repeat CT head 1/6 unchanged   MRI and MRA pending  2D Echo normal ejection fraction 65 to 70%.  No wall motion abnormality.   LDL 120   HgbA1c 6.6   Lovenox 40 mg sq daily for VTE prophylaxis  No antithrombotic prior to admission, now on No antithrombotic given hmg  Therapy recommendations:  CIR but not support at home ->SNF  Disposition:  pending   Medically ready for d/c when bed found - has bed on Monday, for COVID test Sunday (test has been ordered)  Hypertensive Urgency  SBP > 200s on arrival  Treated with Cleviprex, now off  Home meds:  Enalapril 10, lisinopril 10 . SBP goal < 160 . BP currently 140s-150s . Resumed home lisinopril 10 . Increase norvasc to 10mg  . Long-term BP goal normotensive  Diabetes type II   Home meds:  Metformin 500 bid  HgbA1c 6.6, goal < 7.0hy  CBG monitoring   SSI  Metformin 500 mg Bid resumed 07/30/19   Dysphagia . Secondary to stroke . Speech on board . MBSS cleared for D1 honey . On gentle hydration - IVF @ 42 . Grandson, POA, does not think she would want a FT  Other Stroke Risk Factors  Advanced age  Coronary artery disease  Other Active Problems  Hyponatremia, resolved [phone]  Hospital day # 5  [name], MD PhD Stroke Neurology 07/31/2019 7:23 PM  To contact Stroke Continuity provider, please refer to [email or web address] After hours, contact General Neurology

## 2019-07-31 NOTE — Progress Notes (Signed)
Pt transfer to MRI given ativan.

## 2019-07-31 NOTE — Progress Notes (Signed)
Pt always removed the purewick, changed the bed cover 3-4 x, with MASD at groin area and applied skin barrier.

## 2019-08-01 DIAGNOSIS — I10 Essential (primary) hypertension: Secondary | ICD-10-CM

## 2019-08-01 LAB — BASIC METABOLIC PANEL
Anion gap: 9 (ref 5–15)
BUN: 21 mg/dL (ref 8–23)
CO2: 29 mmol/L (ref 22–32)
Calcium: 8.6 mg/dL — ABNORMAL LOW (ref 8.9–10.3)
Chloride: 110 mmol/L (ref 98–111)
Creatinine, Ser: 0.67 mg/dL (ref 0.44–1.00)
GFR calc Af Amer: >60 mL/min (ref >60)
GFR calc non Af Amer: >60 mL/min (ref >60)
Glucose, Bld: 131 mg/dL — ABNORMAL HIGH (ref 70–99)
Potassium: 3.7 mmol/L (ref 3.5–5.1)
Sodium: 148 mmol/L — ABNORMAL HIGH (ref 135–145)

## 2019-08-01 LAB — CBC
HCT: 45.1 % (ref 36.0–46.0)
Hemoglobin: 14.5 g/dL (ref 12.0–15.0)
MCH: 30.1 pg (ref 26.0–34.0)
MCHC: 32.2 g/dL (ref 30.0–36.0)
MCV: 93.8 fL (ref 80.0–100.0)
Platelets: 213 10*3/uL (ref 150–400)
RBC: 4.81 MIL/uL (ref 3.87–5.11)
RDW: 13.5 % (ref 11.5–15.5)
WBC: 9.6 10*3/uL (ref 4.0–10.5)
nRBC: 0 % (ref 0.0–0.2)

## 2019-08-01 LAB — GLUCOSE, CAPILLARY
Glucose-Capillary: 135 mg/dL — ABNORMAL HIGH (ref 70–99)
Glucose-Capillary: 175 mg/dL — ABNORMAL HIGH (ref 70–99)
Glucose-Capillary: 206 mg/dL — ABNORMAL HIGH (ref 70–99)
Glucose-Capillary: 257 mg/dL — ABNORMAL HIGH (ref 70–99)
Glucose-Capillary: 326 mg/dL — ABNORMAL HIGH (ref 70–99)

## 2019-08-01 MED ORDER — QUETIAPINE 12.5 MG HALF TABLET: 12.5000 mg | ORAL_TABLET | Freq: Two times a day (BID) | ORAL | Status: DC

## 2019-08-01 MED ORDER — LISINOPRIL 10 MG PO TABS
10.0000 mg | ORAL_TABLET | Freq: Once | ORAL | Status: AC
  Administered 2019-08-01: 13:00:00 10 mg via ORAL
  Filled 2019-08-01: qty 1

## 2019-08-01 MED ORDER — SODIUM CHLORIDE 0.45 % IV SOLN: INTRAVENOUS | Status: DC

## 2019-08-01 MED ORDER — QUETIAPINE 12.5 MG HALF TABLET
12.5000 mg | ORAL_TABLET | Freq: Two times a day (BID) | ORAL | Status: DC
  Administered 2019-08-01 – 2019-08-02 (×2): 12.5 mg via ORAL
  Filled 2019-08-01 (×5): qty 1

## 2019-08-01 MED ORDER — LISINOPRIL 20 MG PO TABS: 20.0000 mg | ORAL_TABLET | Freq: Every day | ORAL | Status: DC

## 2019-08-01 MED ORDER — LISINOPRIL 20 MG PO TABS
20.0000 mg | ORAL_TABLET | Freq: Two times a day (BID) | ORAL | Status: DC
  Administered 2019-08-01 – 2019-08-02 (×2): 20 mg via ORAL
  Filled 2019-08-01 (×4): qty 1

## 2019-08-01 NOTE — Progress Notes (Signed)
[name] [name]  PROGRESS NOTE    [name]  [name] [name] DOB: 1933-02-20 DOA: 07/26/2019 PCP: Ernestine Conrad, MD   Brief Narrative:   On 07/26/2019 the patient was found by family incapacitated and she presented to the ED with right-sided weakness.  CT scan showed left thalamic hemorrhage with intraventricular extension.  She was subsequently admitted to the ICU. NS eval was not deemed necessary.   08/01/19: No acute events ON. COVID screen negative.    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 and left to right midline shift     - now out of ICU, stable     - apprecaite neuro assistance     - SNF rec'd  DM2     - continue metformin, SSI     - A1c 6.6  HLD     - LDL 120; statin?  Hypertensive urgency     - continue lisinopril 20, amlodipine 10  Hyponatremia, now hypernatremia     - encourage PO intake     - continue gentle fluids  Hyperkalemia     - resolved, monitor  Dysphagia     - puree, honey thick liquids  DVT prophylaxis: SCDs Code Status: FULL Family Communication: None at bedside   Disposition Plan: To SNF  Consultants:   Neurology   Subjective: No acute events ON  Objective: Vitals:   07/31/19 0418 07/31/19 1642 07/31/19 2113 08/01/19 0451  BP: (!) 145/61 (!) 152/69 (!) 174/79 (!) 168/72  Pulse: 65 74 75 71  Resp: 20 16 15 17   Temp: 97.6 F (36.4 C) 98.8 F (37.1 C) 99.5 F (37.5 C) 98.5 F (36.9 C)  TempSrc:  Oral Oral Oral  SpO2: 94% 93% 97% 97%    Intake/Output Summary (Last 24 hours) at 08/01/2019 1432 Last data filed at 08/01/2019 0900 Gross per 24 hour  Intake 1072.99 ml  Output 200 ml  Net 872.99 ml    There were no vitals filed for this visit.  Examination:  General: 86 y.o. female resting in bed in NAD Cardiovascular: RRR, +S1, S2, no m/g/r Respiratory: CTABL, no w/r/r GI: BS+, NDNT, no masses noted MSK: No e/c/c Neuro: Alert, r facial droop, right hemiparesis Psyc: calm/cooperative   Data Reviewed: I have personally reviewed following labs and imaging studies.  CBC: Recent Labs  Lab 07/26/19 2045 07/26/19 2051 07/30/19 0329 07/31/19 0249 08/01/19 0234  WBC 6.9  --  10.3 9.1 9.6  NEUTROABS 6.1  --   --   --   --   HGB 15.5* 16.3* 14.3 14.3 14.5  HCT 45.8 48.0* 43.5 44.5 45.1  MCV 91.4  --  91.8 94.1 93.8  PLT 208  --  211 206 213   Basic Metabolic Panel: Recent Labs  Lab 07/26/19 2051 07/26/19 2141 07/30/19 0329 07/31/19 0249 08/01/19 0234  NA 132* 134* 139 146* 148*  K 7.9* 4.8 3.7 3.7 3.7  CL 100 102 105 110 110  CO2  --  18* 24 26 29   GLUCOSE 227* 262* 177* 165* 131*  BUN 15 11 21 23 21   CREATININE 0.60 0.73 0.85 0.70 0.67  CALCIUM  --  8.9 8.4* 8.8* 8.6*   GFR:  CrCl cannot be calculated (Unknown ideal weight.). Liver Function Tests: Recent Labs  Lab 07/26/19 2141  AST 41  ALT 19  ALKPHOS 70  BILITOT 0.9  PROT 6.6  ALBUMIN 3.2*   No results for input(s): LIPASE, AMYLASE in the last 168 hours. No results for input(s): AMMONIA in the last 168 hours. Coagulation Profile: Recent Labs  Lab 07/26/19 2215  INR 1.1   Cardiac Enzymes: No results for input(s): CKTOTAL, CKMB, CKMBINDEX, TROPONINI in the last 168 hours. BNP (last 3 results) No results for input(s): PROBNP in the last 8760 hours. HbA1C: No results for input(s): HGBA1C in the last 72 hours. CBG: Recent Labs  Lab 07/31/19 1204 07/31/19 1631 07/31/19 2028 08/01/19 0817 08/01/19 1226  GLUCAP 297* 145* 211* 175* 326*   Lipid Profile: No results for input(s): CHOL, HDL, LDLCALC, TRIG, CHOLHDL, LDLDIRECT in the last 72 hours. Thyroid Function Tests: No results for  input(s): TSH, T4TOTAL, FREET4, T3FREE, THYROIDAB in the last 72 hours. Anemia Panel: No results for input(s): VITAMINB12, FOLATE, FERRITIN, TIBC, IRON, RETICCTPCT in the last 72 hours. Sepsis Labs: No results for input(s): PROCALCITON, LATICACIDVEN in the last 168 hours.  Recent Results (from the past 240 hour(s))  SARS CORONAVIRUS 2 (TAT 6-24 HRS) Nasopharyngeal Nasopharyngeal Swab     Status: None   Collection Time: 07/27/19  5:53 AM   Specimen: Nasopharyngeal Swab  Result Value Ref Range Status   SARS Coronavirus 2 NEGATIVE NEGATIVE Final    Comment: (NOTE) SARS-CoV-2 target nucleic acids are NOT DETECTED. The SARS-CoV-2 RNA is generally detectable in upper and lower respiratory specimens during the acute phase of infection. Negative results do not preclude SARS-CoV-2 infection, do not rule out co-infections with other pathogens, and should not be used as the sole basis for treatment or other patient management decisions. Negative results must be combined with clinical observations, patient history, and epidemiological information. The expected result is Negative. Fact Sheet for Patients: HairSlick.no Fact Sheet for Healthcare Providers: quierodirigir.com This test is not yet approved or cleared by the [place] FDA and  has been authorized for detection and/or diagnosis of SARS-CoV-2 by FDA under an Emergency Use Authorization (EUA). This EUA will remain  in effect (meaning this test can be used) for the duration of the COVID-19 declaration under Section 56 4(b)(1) of the Act, 21 U.S.C. section 360bbb-3(b)(1), unless the authorization is terminated or revoked sooner. Performed at [facility], [address] [place], [place] [address]   MRSA PCR Screening     Status: None   Collection Time: 07/27/19  6:29 AM   Specimen: Nasal Mucosa; Nasopharyngeal  Result Value Ref Range Status   MRSA by PCR NEGATIVE  NEGATIVE Final    Comment:        The GeneXpert MRSA Assay (FDA approved for NASAL specimens only), is one component of a comprehensive MRSA colonization surveillance program. It is not intended to diagnose MRSA infection nor to guide or monitor treatment for MRSA infections. Performed at [facility], [address], [place], [place] [address]   SARS CORONAVIRUS 2 (TAT 6-24 HRS) Nasopharyngeal Nasopharyngeal Swab     Status: None   Collection Time: 07/31/19  4:03 PM   Specimen: Nasopharyngeal Swab  Result Value Ref Range Status   SARS Coronavirus 2 NEGATIVE NEGATIVE Final    Comment: (NOTE) SARS-CoV-2 target nucleic acids are NOT DETECTED. The SARS-CoV-2 RNA is generally detectable in upper and lower respiratory specimens during the acute phase of infection. Negative  results do not preclude SARS-CoV-2 infection, do not rule out co-infections with other pathogens, and should not be used as the sole basis for treatment or other patient management decisions. Negative results must be combined with clinical observations, patient history, and epidemiological information. The expected result is Negative. Fact Sheet for Patients: HairSlick.no Fact Sheet for Healthcare Providers: quierodirigir.com This test is not yet approved or cleared by the [place] FDA and  has been authorized for detection and/or diagnosis of SARS-CoV-2 by FDA under an Emergency Use Authorization (EUA). This EUA will remain  in effect (meaning this test can be used) for the duration of the COVID-19 declaration under Section 56 4(b)(1) of the Act, 21 U.S.C. section 360bbb-3(b)(1), unless the authorization is terminated or revoked sooner. Performed at [facility], [address] [place], [place] [address]       Radiology Studies: MR MRA HEAD WO CONTRAST  Result Date: 07/31/2019 CLINICAL DATA:  Intracranial hemorrhage, follow-up EXAM:  MRI HEAD WITHOUT CONTRAST MRA HEAD WITHOUT CONTRAST TECHNIQUE: Multiplanar, multiecho pulse sequences of the brain and surrounding structures were obtained without intravenous contrast. Angiographic images of the head were obtained using MRA technique without contrast. COMPARISON:  None. FINDINGS: MRI HEAD FINDINGS Brain: Large area of parenchymal hemorrhage is again identified with involvement of the left thalamus and adjacent internal capsule/corona radiata. The hemorrhage demonstrates early subacute chronicity peripherally. Size is similar to the recent CT. Intraventricular extension is again noted. Edema surrounds the hemorrhage likely similar in extent to the prior CT with associated partial effacement of the left lateral ventricle as well as the third ventricle. There is no evidence of trapping of the right lateral ventricle. Edema extends superiorly to the left postcentral gyrus along along a thin vein probably reflecting congestion related to distal compression by hematoma. Additional patchy and confluent T2 hyperintensity in the supratentorial white matter is nonspecific but may reflect moderate chronic microvascular ischemic changes. Punctate focus of susceptibility at the right dentate probably reflects mineralization. Vascular: Major vessel flow voids at the skull base are preserved. Skull and upper cervical spine: Normal marrow signal is preserved. Sinuses/Orbits: Paranasal sinuses are aerated. Orbits are unremarkable. Other: Sella is unremarkable.  Mastoid air cells are clear. MRA HEAD FINDINGS Intracranial internal carotid arteries are patent. Middle and anterior cerebral arteries are patent. Intracranial vertebral arteries, basilar artery, posterior cerebral arteries are patent. Bilateral posterior communicating arteries are present. Mild irregularity and stenosis of the left P1 PCA. No aneurysm. No abnormal flow related enhancement identified in the region of the hematoma, noting that there is  intrinsic T1 shortening from the hemorrhage. IMPRESSION: Parenchymal hemorrhage involving the left thalamus and adjacent white matter with intraventricular extension. Associated mass effect is similar to recent CT. No hydrocephalus. Moderate chronic microvascular ischemic changes. Mild irregularity and stenosis of left P1 PCA. Otherwise unremarkable MRA of the head. Electronically Signed   By: Guadlupe Spanish M.D.   On: 07/31/2019 16:46   MR BRAIN WO CONTRAST  Result Date: 07/31/2019 CLINICAL DATA:  Intracranial hemorrhage, follow-up EXAM: MRI HEAD WITHOUT CONTRAST MRA HEAD WITHOUT CONTRAST TECHNIQUE: Multiplanar, multiecho pulse sequences of the brain and surrounding structures were obtained without intravenous contrast. Angiographic images of the head were obtained using MRA technique without contrast. COMPARISON:  None. FINDINGS: MRI HEAD FINDINGS Brain: Large area of parenchymal hemorrhage is again identified with involvement of the left thalamus and adjacent internal capsule/corona radiata. The hemorrhage demonstrates early subacute chronicity peripherally. Size is similar to the recent CT. Intraventricular extension  is again noted. Edema surrounds the hemorrhage likely similar in extent to the prior CT with associated partial effacement of the left lateral ventricle as well as the third ventricle. There is no evidence of trapping of the right lateral ventricle. Edema extends superiorly to the left postcentral gyrus along along a thin vein probably reflecting congestion related to distal compression by hematoma. Additional patchy and confluent T2 hyperintensity in the supratentorial white matter is nonspecific but may reflect moderate chronic microvascular ischemic changes. Punctate focus of susceptibility at the right dentate probably reflects mineralization. Vascular: Major vessel flow voids at the skull base are preserved. Skull and upper cervical spine: Normal marrow signal is preserved. Sinuses/Orbits:  Paranasal sinuses are aerated. Orbits are unremarkable. Other: Sella is unremarkable.  Mastoid air cells are clear. MRA HEAD FINDINGS Intracranial internal carotid arteries are patent. Middle and anterior cerebral arteries are patent. Intracranial vertebral arteries, basilar artery, posterior cerebral arteries are patent. Bilateral posterior communicating arteries are present. Mild irregularity and stenosis of the left P1 PCA. No aneurysm. No abnormal flow related enhancement identified in the region of the hematoma, noting that there is intrinsic T1 shortening from the hemorrhage. IMPRESSION: Parenchymal hemorrhage involving the left thalamus and adjacent white matter with intraventricular extension. Associated mass effect is similar to recent CT. No hydrocephalus. Moderate chronic microvascular ischemic changes. Mild irregularity and stenosis of left P1 PCA. Otherwise unremarkable MRA of the head. Electronically Signed   By: Macy Mis M.D.   On: 07/31/2019 16:46     Scheduled Meds: .  stroke: mapping our early stages of recovery book   Does not apply Once  . amLODipine  10 mg Oral Daily  . bisacodyl  10 mg Rectal Once  . chlorhexidine  15 mL Mouth Rinse BID  . Chlorhexidine Gluconate Cloth  6 each Topical Daily  . enoxaparin (LOVENOX) injection  40 mg Subcutaneous Q24H  . insulin aspart  0-15 Units Subcutaneous TID WC & HS  . [START ON 08/02/2019] lisinopril  20 mg Oral Daily  . mouth rinse  15 mL Mouth Rinse q12n4p  . metFORMIN  500 mg Oral BID WC  . ondansetron (ZOFRAN) IV  4 mg Intravenous Once  . senna-docusate  1 tablet Oral BID   Continuous Infusions: . sodium chloride 50 mL/hr at 08/01/19 1326     LOS: 6 days    Time spent: 25 minutes spent in the coordination of care today.    Jonnie Finner, DO [facility]  If 7PM-7AM, please contact night-coverage [email or web address] 08/01/2019, 2:32 PM

## 2019-08-01 NOTE — Progress Notes (Signed)
STROKE TEAM PROGRESS NOTE   INTERVAL HISTORY No family at bedside. Pt diagonally lying in bed, left leg over the bed rail, severe dysarthria, mildly agitated. EKG repeat showed no QT prolongation and will put on low dose seroquel. She denies leg pain, abdominal pain, chest pain or HA. COVID test negative. Pending SNF discharge.   Vitals:   07/31/19 0418 07/31/19 1642 07/31/19 2113 08/01/19 0451  BP: (!) 145/61 (!) 152/69 (!) 174/79 (!) 168/72  Pulse: 65 74 75 71  Resp: 20 16 15 17   Temp: 97.6 F (36.4 C) 98.8 F (37.1 C) 99.5 F (37.5 C) 98.5 F (36.9 C)  TempSrc:  Oral Oral Oral  SpO2: 94% 93% 97% 97%    CBC:  Recent Labs  Lab 07/26/19 2045 07/26/19 2051 07/31/19 0249 08/01/19 0234  WBC 6.9   < > 9.1 9.6  NEUTROABS 6.1  --   --   --   HGB 15.5*  --  14.3 14.5  HCT 45.8  --  44.5 45.1  MCV 91.4   < > 94.1 93.8  PLT 208   < > 206 213   < > = values in this interval not displayed.    Basic Metabolic Panel:  Recent Labs  Lab 07/31/19 0249 08/01/19 0234  NA 146* 148*  K 3.7 3.7  CL 110 110  CO2 26 29  GLUCOSE 165* 131*  BUN 23 21  CREATININE 0.70 0.67  CALCIUM 8.8* 8.6*    IMAGING past 48 hours  MR BRAIN WO CONTRAST MR MRA HEAD WO CONTRAST 07/31/2019 IMPRESSION:  Parenchymal hemorrhage involving the left thalamus and adjacent white matter with intraventricular extension. Associated mass effect is similar to recent CT. No hydrocephalus. Moderate chronic microvascular ischemic changes. Mild irregularity and stenosis of left P1 PCA. Otherwise unremarkable MRA of the head.    PHYSICAL EXAM    Elderly African-American lady not in distress. . Afebrile. Head is nontraumatic. Neck is supple without bruit.    Cardiac exam no murmur or gallop. Lungs are clear to auscultation. Distal pulses are well felt. Neurological Exam :  Patient is awake and alert she is able to tell me her name, but not orientated to age, place and time. She is able to repeat sentences but severe  dysarthria and difficult to be understood. Follows all simple commands on the left.  No significant gaze deviation, visual field full.  There is right lower facial weakness.  Tongue protrusion to the right.  She has significant right hemiplegia, no significant withdraw to painful stimuli.  She has purposeful antigravity movements on the left.  Right plantars equivocal and left is downgoing.  Gait not tested.  ASSESSMENT/PLAN Ms. GEORGANNA MAXSON is a 86 y.o. female with history of HTN, DB, CAD presenting with R sided weakness.   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 Mild Small vessel disease.   Repeat CT head 1/5 stable hemorrhage w/ increasing surrounding edema. Increasing mass effect w/ partial effacement L lateral ventricle. Stable IVH  Repeat CT head 1/6 unchanged   MRI - Parenchymal hemorrhage involving the left thalamus and adjacent white matter with intraventricular extension. Associated mass effect is similar to recent CT. No CAA  MRA - Mild irregularity and stenosis of left P1 PCA. Otherwise unremarkable MRA of the head.   2D Echo normal ejection fraction 65 to 70%.  No  wall motion abnormality.   LDL 120   HgbA1c 6.6   Lovenox 40 mg sq daily for VTE prophylaxis  No antithrombotic prior to admission, now on No antithrombotic given hmg  Therapy recommendations:  CIR but not support at home ->SNF (COVID neg)  Disposition:  pending   Hypertensive Urgency  SBP > 200s on arrival  Treated with Cleviprex, now off  Home meds:  Enalapril 10, lisinopril 10 . SBP goal < 160 . BP currently on the high end . Increase lisinopril to 20 bid . Continue norvasc to 10mg  . Long-term BP goal normotensive  Diabetes type II   Home meds:  Metformin 500 bid  HgbA1c 6.6, goal < 7.0hy  CBG monitoring   SSI  Glucose fluctuate  Metformin 500 mg Bid resumed 07/30/19    Dysphagia . Secondary to stroke . Speech on board . MBSS cleared for D1 honey - will need speech final recs before discharge . On gentle hydration - IVF 1/2NS @ 40 . Grandson, POA, does not think she would want a FT  Other Stroke Risk Factors  Advanced age  Coronary artery disease  Other Active Problems  Hyponatremia->hypernatremia - Na 134->139->146->148 -> change NS to [facility] day # 6  [name], MD PhD Stroke Neurology 08/01/2019 7:11 PM   To contact Stroke Continuity provider, please refer to [email or web address] After hours, contact General Neurology

## 2019-08-02 LAB — BASIC METABOLIC PANEL
Anion gap: 7 (ref 5–15)
BUN: 21 mg/dL (ref 8–23)
CO2: 27 mmol/L (ref 22–32)
Calcium: 8.7 mg/dL — ABNORMAL LOW (ref 8.9–10.3)
Chloride: 108 mmol/L (ref 98–111)
Creatinine, Ser: 0.62 mg/dL (ref 0.44–1.00)
GFR calc Af Amer: >60 mL/min (ref >60)
GFR calc non Af Amer: >60 mL/min (ref >60)
Glucose, Bld: 199 mg/dL — ABNORMAL HIGH (ref 70–99)
Potassium: 3.6 mmol/L (ref 3.5–5.1)
Sodium: 142 mmol/L (ref 135–145)

## 2019-08-02 LAB — GLUCOSE, CAPILLARY
Glucose-Capillary: 164 mg/dL — ABNORMAL HIGH (ref 70–99)
Glucose-Capillary: 150 mg/dL — ABNORMAL HIGH (ref 70–99)
Glucose-Capillary: 169 mg/dL — ABNORMAL HIGH (ref 70–99)
Glucose-Capillary: 236 mg/dL — ABNORMAL HIGH (ref 70–99)
Glucose-Capillary: 192 mg/dL — ABNORMAL HIGH (ref 70–99)

## 2019-08-02 LAB — CBC
HCT: 44.6 % (ref 36.0–46.0)
Hemoglobin: 14.2 g/dL (ref 12.0–15.0)
MCH: 30.3 pg (ref 26.0–34.0)
MCHC: 31.8 g/dL (ref 30.0–36.0)
MCV: 95.3 fL (ref 80.0–100.0)
Platelets: 217 10*3/uL (ref 150–400)
RBC: 4.68 MIL/uL (ref 3.87–5.11)
RDW: 13.5 % (ref 11.5–15.5)
WBC: 9.2 10*3/uL (ref 4.0–10.5)
nRBC: 0 % (ref 0.0–0.2)

## 2019-08-02 LAB — SARS CORONAVIRUS 2 (TAT 6-24 HRS): SARS Coronavirus 2: NEGATIVE

## 2019-08-02 LAB — MAGNESIUM: Magnesium: 2 mg/dL (ref 1.7–2.4)

## 2019-08-02 MED ORDER — ATORVASTATIN CALCIUM 10 MG PO TABS
20.0000 mg | ORAL_TABLET | Freq: Every day | ORAL | Status: DC
  Administered 2019-08-02: 18:00:00 20 mg via ORAL
  Filled 2019-08-02: qty 2

## 2019-08-02 NOTE — Progress Notes (Deleted)
Pt alert oriented. VS: Temp- 98.3, BP- 155/69 (95), HR- 70, RR-19, Sp02- 97% RA. Blood sugar of 192. Pt has been refusing all scheduled medications for this shift despite education of the importance of taking them. MD on call was notified with situation, will continue to monitor with remainder of shift.

## 2019-08-02 NOTE — Progress Notes (Signed)
[name]  PROGRESS NOTE    [name] [name]  WNU:[id number] DOB: 1932-08-29 DOA: 07/26/2019 PCP: Celedonio Savage, MD   Brief Narrative:   On 07/26/2019 the patient was found by family incapacitated and she presented to the ED with right-sided weakness. CT scan showed left thalamic hemorrhage with intraventricular extension. She was subsequently admitted to the ICU. NS eval was not deemed necessary.  08/02/19: Needs repeat COVID for SNF. Has bed offer. Doing well today.    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 and left to right midline shift     - now out of ICU, stable     - apprecaite neuro assistance     - SNF rec'd; bed secured, needs new COVID screen, ordered, awaiting results  DM2     - continue metformin, SSI     - A1c 6.6  HLD     - LDL 120     - spoke with neuro; will start lipitor 20mg  qHS  Hypertensive urgency     - continue lisinopril 20, amlodipine 10  Hyponatremia, now hypernatremia     - encourage PO intake     - continue gentle fluids  Hyperkalemia     - resolved, monitor  Dysphagia     - puree, honey thick liquids     - continue w/u SLP  DVT prophylaxis: SCDs Code Status: FULL Family Communication: None at bedside   Disposition Plan: To SNF  Consultants:   Neurology  ROS:  Unable to obtain d/t communication difficulty  Subjective: No acute events ON.   Objective: Vitals:   08/01/19 0451 08/01/19 1142 08/01/19 2117 08/02/19 0603  BP: (!) 168/72 (!) 179/79 (!) 165/72 (!) 162/70  Pulse: 71 72 72 70  Resp: 17 20 17 17   Temp: 98.5 F (36.9 C) 98.6 F (37 C) 98.1 F (36.7 C) 98 F (36.7 C)  TempSrc: Oral  Oral Oral Axillary  SpO2: 97%  97% 97%    Intake/Output Summary (Last 24 hours) at 08/02/2019 0839 Last data filed at 08/02/2019 0604 Gross per 24 hour  Intake 1801.62 ml  Output 2600 ml  Net -798.38 ml   There were no vitals filed for this visit.  Examination:  General: 86 y.o. female resting in bed in NAD Cardiovascular: RRR, +S1, S2, no m/g/r, equal pulses throughout Respiratory: CTABL, no w/r/r, normal WOB GI: BS+, NDNT, soft MSK: No e/c/c Neuro: alert, r facial droop, right hemiparesis, aphasia Psyc: calm/cooperative   Data Reviewed: I have personally reviewed following labs and imaging studies.  CBC: Recent Labs  Lab 07/26/19 2045 07/26/19 2051 07/30/19 0329 07/31/19 0249 08/01/19 0234 08/02/19 0143  WBC 6.9  --  10.3 9.1 9.6 9.2  NEUTROABS 6.1  --   --   --   --   --   HGB 15.5* 16.3* 14.3 14.3 14.5 14.2  HCT 45.8 48.0* 43.5 44.5 45.1 44.6  MCV 91.4  --  91.8 94.1 93.8 95.3  PLT 208  --  211 206 213 034   Basic Metabolic Panel: Recent Labs  Lab 07/26/19 2141 07/30/19 0329 07/31/19 0249 08/01/19 0234 08/02/19 0143  NA 134* 139 146* 148* 142  K 4.8  3.7 3.7 3.7 3.6  CL 102 105 110 110 108  CO2 18* 24 26 29 27   GLUCOSE 262* 177* 165* 131* 199*  BUN 11 21 23 21 21   CREATININE 0.73 0.85 0.70 0.67 0.62  CALCIUM 8.9 8.4* 8.8* 8.6* 8.7*  MG  --   --   --   --  2.0   GFR: CrCl cannot be calculated (Unknown ideal weight.). Liver Function Tests: Recent Labs  Lab 07/26/19 2141  AST 41  ALT 19  ALKPHOS 70  BILITOT 0.9  PROT 6.6  ALBUMIN 3.2*   No results for input(s): LIPASE, AMYLASE in the last 168 hours. No results for input(s): AMMONIA in the last 168 hours. Coagulation Profile: Recent Labs  Lab 07/26/19 2215  INR 1.1   Cardiac Enzymes: No results for input(s): CKTOTAL, CKMB, CKMBINDEX, TROPONINI in the last 168 hours. BNP (last 3 results) No results for input(s): PROBNP in the last 8760 hours. HbA1C: No results for input(s): HGBA1C in  the last 72 hours. CBG: Recent Labs  Lab 08/01/19 1226 08/01/19 1710 08/01/19 2119 08/01/19 2336 08/02/19 0751  GLUCAP 326* 135* 257* 206* 150*   Lipid Profile: No results for input(s): CHOL, HDL, LDLCALC, TRIG, CHOLHDL, LDLDIRECT in the last 72 hours. Thyroid Function Tests: No results for input(s): TSH, T4TOTAL, FREET4, T3FREE, THYROIDAB in the last 72 hours. Anemia Panel: No results for input(s): VITAMINB12, FOLATE, FERRITIN, TIBC, IRON, RETICCTPCT in the last 72 hours. Sepsis Labs: No results for input(s): PROCALCITON, LATICACIDVEN in the last 168 hours.  Recent Results (from the past 240 hour(s))  SARS CORONAVIRUS 2 (TAT 6-24 HRS) Nasopharyngeal Nasopharyngeal Swab     Status: None   Collection Time: 07/27/19  5:53 AM   Specimen: Nasopharyngeal Swab  Result Value Ref Range Status   SARS Coronavirus 2 NEGATIVE NEGATIVE Final    Comment: (NOTE) SARS-CoV-2 target nucleic acids are NOT DETECTED. The SARS-CoV-2 RNA is generally detectable in upper and lower respiratory specimens during the acute phase of infection. Negative results do not preclude SARS-CoV-2 infection, do not rule out co-infections with other pathogens, and should not be used as the sole basis for treatment or other patient management decisions. Negative results must be combined with clinical observations, patient history, and epidemiological information. The expected result is Negative. Fact Sheet for Patients: 09/30/19 Fact Sheet for Healthcare Providers: 09/24/19 This test is not yet approved or cleared by the [employer] FDA and  has been authorized for detection and/or diagnosis of SARS-CoV-2 by FDA under an Emergency Use Authorization (EUA). This EUA will remain  in effect (meaning this test can be used) for the duration of the COVID-19 declaration under Section 56 4(b)(1) of the Act, 21 U.S.C. section 360bbb-3(b)(1), unless the  authorization is terminated or revoked sooner. Performed at [facility], [address] [place], [address] [place]   MRSA PCR Screening     Status: None   Collection Time: 07/27/19  6:29 AM   Specimen: Nasal Mucosa; Nasopharyngeal  Result Value Ref Range Status   MRSA by PCR NEGATIVE NEGATIVE Final    Comment:        The GeneXpert MRSA Assay (FDA approved for NASAL specimens only), is one component of a comprehensive MRSA colonization surveillance program. It is not intended to diagnose MRSA infection nor to guide or monitor treatment for MRSA infections. Performed at [facility], [address], [place], [address] [place]   SARS CORONAVIRUS 2 (TAT 6-24 HRS) Nasopharyngeal Nasopharyngeal Swab  Status: None   Collection Time: 07/31/19  4:03 PM   Specimen: Nasopharyngeal Swab  Result Value Ref Range Status   SARS Coronavirus 2 NEGATIVE NEGATIVE Final    Comment: (NOTE) SARS-CoV-2 target nucleic acids are NOT DETECTED. The SARS-CoV-2 RNA is generally detectable in upper and lower respiratory specimens during the acute phase of infection. Negative results do not preclude SARS-CoV-2 infection, do not rule out co-infections with other pathogens, and should not be used as the sole basis for treatment or other patient management decisions. Negative results must be combined with clinical observations, patient history, and epidemiological information. The expected result is Negative. Fact Sheet for Patients: [employer] Fact Sheet for Healthcare Providers: quierodirigir.com This test is not yet approved or cleared by the [place] FDA and  has been authorized for detection and/or diagnosis of SARS-CoV-2 by FDA under an Emergency Use Authorization (EUA). This EUA will remain  in effect (meaning this test can be used) for the duration of the COVID-19 declaration under Section 56 4(b)(1) of the Act, 21  U.S.C. section 360bbb-3(b)(1), unless the authorization is terminated or revoked sooner. Performed at [facility], [address] [place], [place] [address]       Radiology Studies: MR MRA HEAD WO CONTRAST  Result Date: 07/31/2019 CLINICAL DATA:  Intracranial hemorrhage, follow-up EXAM: MRI HEAD WITHOUT CONTRAST MRA HEAD WITHOUT CONTRAST TECHNIQUE: Multiplanar, multiecho pulse sequences of the brain and surrounding structures were obtained without intravenous contrast. Angiographic images of the head were obtained using MRA technique without contrast. COMPARISON:  None. FINDINGS: MRI HEAD FINDINGS Brain: Large area of parenchymal hemorrhage is again identified with involvement of the left thalamus and adjacent internal capsule/corona radiata. The hemorrhage demonstrates early subacute chronicity peripherally. Size is similar to the recent CT. Intraventricular extension is again noted. Edema surrounds the hemorrhage likely similar in extent to the prior CT with associated partial effacement of the left lateral ventricle as well as the third ventricle. There is no evidence of trapping of the right lateral ventricle. Edema extends superiorly to the left postcentral gyrus along along a thin vein probably reflecting congestion related to distal compression by hematoma. Additional patchy and confluent T2 hyperintensity in the supratentorial white matter is nonspecific but may reflect moderate chronic microvascular ischemic changes. Punctate focus of susceptibility at the right dentate probably reflects mineralization. Vascular: Major vessel flow voids at the skull base are preserved. Skull and upper cervical spine: Normal marrow signal is preserved. Sinuses/Orbits: Paranasal sinuses are aerated. Orbits are unremarkable. Other: Sella is unremarkable.  Mastoid air cells are clear. MRA HEAD FINDINGS Intracranial internal carotid arteries are patent. Middle and anterior cerebral arteries are patent.  Intracranial vertebral arteries, basilar artery, posterior cerebral arteries are patent. Bilateral posterior communicating arteries are present. Mild irregularity and stenosis of the left P1 PCA. No aneurysm. No abnormal flow related enhancement identified in the region of the hematoma, noting that there is intrinsic T1 shortening from the hemorrhage. IMPRESSION: Parenchymal hemorrhage involving the left thalamus and adjacent white matter with intraventricular extension. Associated mass effect is similar to recent CT. No hydrocephalus. Moderate chronic microvascular ischemic changes. Mild irregularity and stenosis of left P1 PCA. Otherwise unremarkable MRA of the head. Electronically Signed   By: Guadlupe Spanish M.D.   On: 07/31/2019 16:46   MR BRAIN WO CONTRAST  Result Date: 07/31/2019 CLINICAL DATA:  Intracranial hemorrhage, follow-up EXAM: MRI HEAD WITHOUT CONTRAST MRA HEAD WITHOUT CONTRAST TECHNIQUE: Multiplanar, multiecho pulse sequences of the brain and surrounding structures  were obtained without intravenous contrast. Angiographic images of the head were obtained using MRA technique without contrast. COMPARISON:  None. FINDINGS: MRI HEAD FINDINGS Brain: Large area of parenchymal hemorrhage is again identified with involvement of the left thalamus and adjacent internal capsule/corona radiata. The hemorrhage demonstrates early subacute chronicity peripherally. Size is similar to the recent CT. Intraventricular extension is again noted. Edema surrounds the hemorrhage likely similar in extent to the prior CT with associated partial effacement of the left lateral ventricle as well as the third ventricle. There is no evidence of trapping of the right lateral ventricle. Edema extends superiorly to the left postcentral gyrus along along a thin vein probably reflecting congestion related to distal compression by hematoma. Additional patchy and confluent T2 hyperintensity in the supratentorial white matter is  nonspecific but may reflect moderate chronic microvascular ischemic changes. Punctate focus of susceptibility at the right dentate probably reflects mineralization. Vascular: Major vessel flow voids at the skull base are preserved. Skull and upper cervical spine: Normal marrow signal is preserved. Sinuses/Orbits: Paranasal sinuses are aerated. Orbits are unremarkable. Other: Sella is unremarkable.  Mastoid air cells are clear. MRA HEAD FINDINGS Intracranial internal carotid arteries are patent. Middle and anterior cerebral arteries are patent. Intracranial vertebral arteries, basilar artery, posterior cerebral arteries are patent. Bilateral posterior communicating arteries are present. Mild irregularity and stenosis of the left P1 PCA. No aneurysm. No abnormal flow related enhancement identified in the region of the hematoma, noting that there is intrinsic T1 shortening from the hemorrhage. IMPRESSION: Parenchymal hemorrhage involving the left thalamus and adjacent white matter with intraventricular extension. Associated mass effect is similar to recent CT. No hydrocephalus. Moderate chronic microvascular ischemic changes. Mild irregularity and stenosis of left P1 PCA. Otherwise unremarkable MRA of the head. Electronically Signed   By: Guadlupe Spanish M.D.   On: 07/31/2019 16:46     Scheduled Meds: .  stroke: mapping our early stages of recovery book   Does not apply Once  . amLODipine  10 mg Oral Daily  . bisacodyl  10 mg Rectal Once  . chlorhexidine  15 mL Mouth Rinse BID  . Chlorhexidine Gluconate Cloth  6 each Topical Daily  . enoxaparin (LOVENOX) injection  40 mg Subcutaneous Q24H  . insulin aspart  0-15 Units Subcutaneous TID WC & HS  . lisinopril  20 mg Oral BID  . mouth rinse  15 mL Mouth Rinse q12n4p  . metFORMIN  500 mg Oral BID WC  . ondansetron (ZOFRAN) IV  4 mg Intravenous Once  . QUEtiapine  12.5 mg Oral BID  . senna-docusate  1 tablet Oral BID   Continuous Infusions: . sodium  chloride 50 mL/hr at 08/01/19 1326     LOS: 7 days    Time spent: 25 minutes spent in the coordination of care today.    Teddy Spike, DO [facility]  If 7PM-7AM, please contact night-coverage [email or web address] 08/02/2019, 8:39 AM

## 2019-08-02 NOTE — Care Management Important Message (Signed)
Important Message  Patient Details  Name: [name] [name] MRN: [id number] Date of Birth: Feb 11, 1933   Medicare Important Message Given:  Yes     Mardene Sayer 08/02/2019, 3:37 PM

## 2019-08-02 NOTE — TOC Progression Note (Addendum)
Transition of Care [facility]) - Progression Note    Patient Details  Name: JACKQULYN MENDEL MRN: [id number] Date of Birth: Dec 03, 1932  [facility] of [facility] CM/SW Contact  Doy Hutching, [place] Phone Number: 08/02/2019, 8:20 AM  Clinical Narrative:   11:00am- Spoke with pt grandson Kathlene November. Family okay with [facility] still, he understands we have ordered another COVID swab and that if negative pt is stable per MD for discharge. Pt grandson expresses discontent with communication from MD staff, CSW apologized states I will page MD to request that he call grandson. Pt grandson with several questions about pt Medicaid application process. CSW had sent link with [facility] DSS information and encouraged grandson to call and discuss any questions/how to start referral with staff there.   MD paged with grandson's name and number to call. COVID collected and in process.    8:20am- Pt needs a negative within 24 hrs of discharge per SNF policy [facility]), previous result was on 1/9. Requested a new COVID from MD and alerted RN for swab.   Expected Discharge Plan: Skilled Nursing Facility Barriers to Discharge: Continued Medical Work up  Expected Discharge Plan and Services Expected Discharge Plan: Skilled Nursing Facility In-house Referral: Clinical Social Work Discharge Planning Services: CM Consult Post Acute Care Choice: Skilled Nursing Facility Living arrangements for the past 2 months: Single Family Home                  Readmission Risk Interventions Readmission Risk Prevention Plan 07/30/2019  Post Dischage Appt Not Complete  Appt Comments plan for SNF  Medication Screening Complete  Transportation Screening Complete  Some recent data might be hidden

## 2019-08-02 NOTE — Progress Notes (Addendum)
STROKE TEAM PROGRESS NOTE   INTERVAL HISTORY Pt lying in bed, no distress, mildly lethargic. Initially sleeping but easily arousable. Denies any pain. Speech on board, still on dys 1 with honey thick.   Vitals:   08/01/19 0451 08/01/19 1142 08/01/19 2117 08/02/19 0603  BP: (!) 168/72 (!) 179/79 (!) 165/72 (!) 162/70  Pulse: 71 72 72 70  Resp: 17 20 17 17   Temp: 98.5 F (36.9 C) 98.6 F (37 C) 98.1 F (36.7 C) 98 F (36.7 C)  TempSrc: Oral Oral Oral Axillary  SpO2: 97%  97% 97%    CBC:  Recent Labs  Lab 07/26/19 2045 07/26/19 2051 08/01/19 0234 08/02/19 0143  WBC 6.9   < > 9.6 9.2  NEUTROABS 6.1  --   --   --   HGB 15.5*  --  14.5 14.2  HCT 45.8  --  45.1 44.6  MCV 91.4   < > 93.8 95.3  PLT 208   < > 213 217   < > = values in this interval not displayed.    Basic Metabolic Panel:  Recent Labs  Lab 08/01/19 0234 08/02/19 0143  NA 148* 142  K 3.7 3.6  CL 110 108  CO2 29 27  GLUCOSE 131* 199*  BUN 21 21  CREATININE 0.67 0.62  CALCIUM 8.6* 8.7*  MG  --  2.0    PHYSICAL EXAM   Elderly African-American lady not in distress. Afebrile. Head is nontraumatic. Neck is supple without bruit.    Cardiac exam no murmur or gallop. Lungs are clear to auscultation. Distal pulses are well felt. Neurological Exam:  Patient is lethargic and initially sleeping but easily open eyes, she is able to tell me her name, but not orientated to age, place and time. Severe dysarthria. She is able to repeat short sentences but severe dysarthria and difficult to be understood for spontaneous speaking. Follows all simple commands on the left.  No significant gaze deviation, visual field full.  There is right lower facial weakness.  Tongue protrusion to the right.  She has significant right hemiplegia, no significant withdraw to painful stimuli.  She has purposeful antigravity movements on the left.  Right plantars equivocal and left is downgoing.  Gait not tested.  ASSESSMENT/PLAN [name] is a 86 y.o. female with history of HTN, DB, CAD presenting with R sided weakness.   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 Mild Small vessel disease.   Repeat CT head 1/5 stable hemorrhage w/ increasing surrounding edema. Increasing mass effect w/ partial effacement L lateral ventricle. Stable IVH  Repeat CT head 1/6 unchanged   MRI - Parenchymal hemorrhage involving the left thalamus and adjacent white matter with intraventricular extension. Associated mass effect is similar to recent CT. No CAA  MRA - Mild irregularity and stenosis of left P1 PCA. Otherwise unremarkable MRA of the head.   2D Echo normal ejection fraction 65 to 70%.  No wall motion abnormality.   LDL 120   HgbA1c 6.6   Lovenox 40 mg sq daily for VTE prophylaxis  No antithrombotic prior to admission, now on No antithrombotic given hmg  Therapy recommendations:  CIR but not support at home ->SNF  Disposition:  SNF planned today. Repeat COVID today.  Stroke team will sign off. Follow up in 4 weeks at Stroke Clinic. Orders placed.  Hypertensive Urgency  SBP > 200s on arrival  Treated with Cleviprex, now off  Home meds:  Enalapril 10, lisinopril 10 . SBP goal < 160 . BP currently on the high end . Increase lisinopril to 20 bid . Continue norvasc to 10mg  . Long-term BP goal normotensive  Diabetes type II   Home meds:  Metformin 500 bid  HgbA1c 6.6, goal < 7.0hy  CBG monitoring   SSI  Glucose fluctuate  Metformin 500 mg Bid resumed 07/30/19   Dysphagia . Secondary to stroke . Speech on board . Continue D1 honey and honey thick . On gentle hydration - IVF 1/2NS @ 50 . Grandson, POA, does not think she would want a FT  Hyperlipidemia  LDL 120, goal < 70  No statin during acute ICH phase  AST ALT WNL  Recommend start lipitor 20 on discharge.  Agitation   Agitated  1/10  EKG showed QTc 485  Added seroquel 12.5mg  bid 1/10  Tolerating well - much more calm and comfortable  Other Stroke Risk Factors  Advanced age  Coronary artery disease  Other Active Problems  Hyponatremia->hypernatremia - Na 134->139->146->148 -> change NS to 1/2NS -> 142->142  Hospital day # 7  Neurology will sign off. Please call with questions. Pt will follow up with stroke clinic NP at [facility] in about 4 weeks. Thanks for the consult.   [facility], MD PhD Stroke Neurology 08/02/2019 1:13 PM   To contact Stroke Continuity provider, please refer to 09/30/2019. After hours, contact General Neurology

## 2019-08-02 NOTE — Progress Notes (Signed)
  Speech Language Pathology Treatment: Dysphagia;Cognitive-Linquistic  Patient Details Name: [name] [name] MRN: [id number] DOB: 04/25/1933 Today's Date: 08/02/2019 Time: 0254-2706 SLP Time Calculation (min) (ACUTE ONLY): 26 min  Assessment / Plan / Recommendation Clinical Impression  Pt was seen for skilled ST targeting dysphagia and aphasia.  Pt was encountered awake/alert sitting upright in bed and she was agreeable to tx session.  NT reported that pt had good PO intake at breakfast this AM and that she had tolerated her meal without difficulty.  Pt was observed with trials of puree (4oz pudding), honey-thick liquid via tsp/straw, and nectar-thick liquid via straw.  She was observed to be impulsive and she benefited from verbal and tactile cues to limit bolus size and slow rate of intake.  She additionally exhibited prolonged AP transport with pureed solids with minimal anterior labial spillage on the R side.  This was not observed with liquid trials via tsp or straw.  Pt exhibited delayed throat clearing with nectar-thick liquid, but no clinical s/sx of aspiration or difficulty were observed with honey-thick liquid or puree trials.  Recommend continuation of Dysphagia 1 (puree) solids and honey-thick liquids with full supervision during meals to assist with self-feeding and to cue for compensatory strategies.    Pt completed multiple expressive and receptive language tasks including confrontational naming, automatic speech tasks, following 1-2 step commands, and conversational speech tasks.  Pt completed confrontational naming task with 80% accuracy independently, improving to 100% given moderate phonemic cues.  Speech intelligibility was reduced, but pt was approximately 75% intelligible at the word level.  Intelligibility decreased at the phrase and short sentence level during conversational speech tasks.  She followed basic 1 step commands with 100% accuracy and she followed 2 step commands  with 50% accuracy independently, improving to 100% accuracy given mod-max visual, verbal and tactile cues.  Pt was able to count from 1-10 given a verbal model; however, she was unable to name the months of the year, days of the week, or colors despite a verbal model and written cues.  Pt reported pain, but she was unable to describe where her pain was or the intensity level on a scale from 1-10 despite visual/verbal cues and yes/no questions. Recommend continued ST targeting cognitive-linguistic deficits and dysphagia at time of discharge.  SLP will continue to f/u per POC.     HPI HPI: Pt is an 86 yo female presenting with R weakness, found to have large L thalamic hemorrhage. PMH: DM, HTN, CAD      SLP Plan  Continue with current plan of care       Recommendations  Diet recommendations: Dysphagia 1 (puree);Honey-thick liquid Liquids provided via: Cup;Straw Medication Administration: Crushed with puree Supervision: Staff to assist with self feeding;Full supervision/cueing for compensatory strategies Compensations: Minimize environmental distractions;Slow rate;Small sips/bites;Lingual sweep for clearance of pocketing;Monitor for anterior loss;Other (Comment)(One sip at a time ) Postural Changes and/or Swallow Maneuvers: Seated upright 90 degrees;Upright 30-60 min after meal                Oral Care Recommendations: Oral care QID;Staff/trained caregiver to provide oral care Follow up Recommendations: Inpatient Rehab SLP Visit Diagnosis: Dysphagia, oropharyngeal phase (R13.12) Plan: Continue with current plan of care                     Villa Herb M.S., CCC-SLP Acute Rehabilitation Services Office: [phone]  Shanon Rosser [facility] 08/02/2019, 9:50 AM

## 2019-08-03 ENCOUNTER — Inpatient Hospital Stay
Admission: RE | Admit: 2019-08-03 | Discharge: 2020-03-14 | Disposition: A | Discharge status: Nursing Home (Includes ICF) | Payer: Medicare Other | Source: Ambulatory Visit | Attending: Internal Medicine | Admitting: Internal Medicine

## 2019-08-03 DIAGNOSIS — M47812 Spondylosis without myelopathy or radiculopathy, cervical region: Secondary | ICD-10-CM | POA: Diagnosis not present

## 2019-08-03 DIAGNOSIS — G9389 Other specified disorders of brain: Secondary | ICD-10-CM | POA: Diagnosis not present

## 2019-08-03 DIAGNOSIS — M2550 Pain in unspecified joint: Secondary | ICD-10-CM | POA: Diagnosis not present

## 2019-08-03 DIAGNOSIS — I69320 Aphasia following cerebral infarction: Secondary | ICD-10-CM | POA: Diagnosis not present

## 2019-08-03 DIAGNOSIS — R456 Violent behavior: Secondary | ICD-10-CM | POA: Diagnosis not present

## 2019-08-03 DIAGNOSIS — S0990XA Unspecified injury of head, initial encounter: Secondary | ICD-10-CM | POA: Diagnosis not present

## 2019-08-03 DIAGNOSIS — S199XXA Unspecified injury of neck, initial encounter: Secondary | ICD-10-CM | POA: Diagnosis not present

## 2019-08-03 DIAGNOSIS — M810 Age-related osteoporosis without current pathological fracture: Secondary | ICD-10-CM | POA: Diagnosis not present

## 2019-08-03 DIAGNOSIS — E1165 Type 2 diabetes mellitus with hyperglycemia: Secondary | ICD-10-CM | POA: Diagnosis not present

## 2019-08-03 DIAGNOSIS — E785 Hyperlipidemia, unspecified: Secondary | ICD-10-CM | POA: Diagnosis not present

## 2019-08-03 DIAGNOSIS — R293 Abnormal posture: Secondary | ICD-10-CM | POA: Diagnosis not present

## 2019-08-03 DIAGNOSIS — W01198A Fall on same level from slipping, tripping and stumbling with subsequent striking against other object, initial encounter: Secondary | ICD-10-CM | POA: Diagnosis not present

## 2019-08-03 DIAGNOSIS — H25813 Combined forms of age-related cataract, bilateral: Secondary | ICD-10-CM | POA: Diagnosis not present

## 2019-08-03 DIAGNOSIS — Y939 Activity, unspecified: Secondary | ICD-10-CM | POA: Diagnosis not present

## 2019-08-03 DIAGNOSIS — I517 Cardiomegaly: Secondary | ICD-10-CM | POA: Diagnosis not present

## 2019-08-03 DIAGNOSIS — I6389 Other cerebral infarction: Secondary | ICD-10-CM | POA: Diagnosis not present

## 2019-08-03 DIAGNOSIS — E1169 Type 2 diabetes mellitus with other specified complication: Secondary | ICD-10-CM | POA: Diagnosis not present

## 2019-08-03 DIAGNOSIS — F331 Major depressive disorder, recurrent, moderate: Secondary | ICD-10-CM | POA: Diagnosis not present

## 2019-08-03 DIAGNOSIS — I209 Angina pectoris, unspecified: Secondary | ICD-10-CM | POA: Diagnosis not present

## 2019-08-03 DIAGNOSIS — Z7984 Long term (current) use of oral hypoglycemic drugs: Secondary | ICD-10-CM | POA: Diagnosis not present

## 2019-08-03 DIAGNOSIS — M6281 Muscle weakness (generalized): Secondary | ICD-10-CM | POA: Diagnosis not present

## 2019-08-03 DIAGNOSIS — Z7401 Bed confinement status: Secondary | ICD-10-CM | POA: Diagnosis not present

## 2019-08-03 DIAGNOSIS — Z741 Need for assistance with personal care: Secondary | ICD-10-CM | POA: Diagnosis not present

## 2019-08-03 DIAGNOSIS — I639 Cerebral infarction, unspecified: Secondary | ICD-10-CM | POA: Diagnosis not present

## 2019-08-03 DIAGNOSIS — F411 Generalized anxiety disorder: Secondary | ICD-10-CM | POA: Diagnosis not present

## 2019-08-03 DIAGNOSIS — I10 Essential (primary) hypertension: Secondary | ICD-10-CM | POA: Diagnosis not present

## 2019-08-03 DIAGNOSIS — G936 Cerebral edema: Secondary | ICD-10-CM | POA: Diagnosis not present

## 2019-08-03 DIAGNOSIS — E119 Type 2 diabetes mellitus without complications: Secondary | ICD-10-CM | POA: Diagnosis not present

## 2019-08-03 DIAGNOSIS — I615 Nontraumatic intracerebral hemorrhage, intraventricular: Secondary | ICD-10-CM | POA: Diagnosis not present

## 2019-08-03 DIAGNOSIS — R279 Unspecified lack of coordination: Secondary | ICD-10-CM | POA: Diagnosis not present

## 2019-08-03 DIAGNOSIS — I2510 Atherosclerotic heart disease of native coronary artery without angina pectoris: Secondary | ICD-10-CM | POA: Diagnosis not present

## 2019-08-03 DIAGNOSIS — G3184 Mild cognitive impairment, so stated: Secondary | ICD-10-CM | POA: Diagnosis not present

## 2019-08-03 DIAGNOSIS — I610 Nontraumatic intracerebral hemorrhage in hemisphere, subcortical: Secondary | ICD-10-CM | POA: Diagnosis not present

## 2019-08-03 DIAGNOSIS — F0630 Mood disorder due to known physiological condition, unspecified: Secondary | ICD-10-CM | POA: Diagnosis not present

## 2019-08-03 DIAGNOSIS — M1990 Unspecified osteoarthritis, unspecified site: Secondary | ICD-10-CM | POA: Diagnosis not present

## 2019-08-03 DIAGNOSIS — F4323 Adjustment disorder with mixed anxiety and depressed mood: Secondary | ICD-10-CM | POA: Diagnosis not present

## 2019-08-03 DIAGNOSIS — M4802 Spinal stenosis, cervical region: Secondary | ICD-10-CM | POA: Diagnosis not present

## 2019-08-03 DIAGNOSIS — R937 Abnormal findings on diagnostic imaging of other parts of musculoskeletal system: Secondary | ICD-10-CM | POA: Diagnosis not present

## 2019-08-03 DIAGNOSIS — S0101XA Laceration without foreign body of scalp, initial encounter: Secondary | ICD-10-CM | POA: Diagnosis not present

## 2019-08-03 DIAGNOSIS — K5909 Other constipation: Secondary | ICD-10-CM | POA: Diagnosis not present

## 2019-08-03 DIAGNOSIS — R1312 Dysphagia, oropharyngeal phase: Secondary | ICD-10-CM | POA: Diagnosis not present

## 2019-08-03 DIAGNOSIS — Z79899 Other long term (current) drug therapy: Secondary | ICD-10-CM | POA: Diagnosis not present

## 2019-08-03 DIAGNOSIS — R471 Dysarthria and anarthria: Secondary | ICD-10-CM | POA: Diagnosis not present

## 2019-08-03 DIAGNOSIS — R4781 Slurred speech: Secondary | ICD-10-CM | POA: Diagnosis not present

## 2019-08-03 DIAGNOSIS — E1159 Type 2 diabetes mellitus with other circulatory complications: Secondary | ICD-10-CM | POA: Diagnosis not present

## 2019-08-03 DIAGNOSIS — Z0000 Encounter for general adult medical examination without abnormal findings: Secondary | ICD-10-CM | POA: Diagnosis not present

## 2019-08-03 DIAGNOSIS — J189 Pneumonia, unspecified organism: Secondary | ICD-10-CM | POA: Diagnosis not present

## 2019-08-03 DIAGNOSIS — R4701 Aphasia: Secondary | ICD-10-CM | POA: Diagnosis not present

## 2019-08-03 DIAGNOSIS — I69391 Dysphagia following cerebral infarction: Secondary | ICD-10-CM | POA: Diagnosis not present

## 2019-08-03 DIAGNOSIS — Y929 Unspecified place or not applicable: Secondary | ICD-10-CM | POA: Diagnosis not present

## 2019-08-03 DIAGNOSIS — W19XXXS Unspecified fall, sequela: Secondary | ICD-10-CM | POA: Diagnosis not present

## 2019-08-03 DIAGNOSIS — R0689 Other abnormalities of breathing: Secondary | ICD-10-CM | POA: Diagnosis not present

## 2019-08-03 DIAGNOSIS — Y999 Unspecified external cause status: Secondary | ICD-10-CM | POA: Diagnosis not present

## 2019-08-03 DIAGNOSIS — E871 Hypo-osmolality and hyponatremia: Secondary | ICD-10-CM | POA: Diagnosis not present

## 2019-08-03 DIAGNOSIS — E875 Hyperkalemia: Secondary | ICD-10-CM | POA: Diagnosis not present

## 2019-08-03 DIAGNOSIS — S0101XS Laceration without foreign body of scalp, sequela: Secondary | ICD-10-CM | POA: Diagnosis not present

## 2019-08-03 DIAGNOSIS — R634 Abnormal weight loss: Secondary | ICD-10-CM | POA: Diagnosis not present

## 2019-08-03 DIAGNOSIS — Y92129 Unspecified place in nursing home as the place of occurrence of the external cause: Secondary | ICD-10-CM | POA: Diagnosis not present

## 2019-08-03 DIAGNOSIS — Z1159 Encounter for screening for other viral diseases: Secondary | ICD-10-CM | POA: Diagnosis not present

## 2019-08-03 DIAGNOSIS — R1310 Dysphagia, unspecified: Secondary | ICD-10-CM

## 2019-08-03 DIAGNOSIS — I69351 Hemiplegia and hemiparesis following cerebral infarction affecting right dominant side: Secondary | ICD-10-CM | POA: Diagnosis not present

## 2019-08-03 LAB — GLUCOSE, CAPILLARY
Glucose-Capillary: 147 mg/dL — ABNORMAL HIGH (ref 70–99)
Glucose-Capillary: 152 mg/dL — ABNORMAL HIGH (ref 70–99)

## 2019-08-03 LAB — BASIC METABOLIC PANEL
Anion gap: 8 (ref 5–15)
BUN: 19 mg/dL (ref 8–23)
CO2: 26 mmol/L (ref 22–32)
Calcium: 8.4 mg/dL — ABNORMAL LOW (ref 8.9–10.3)
Chloride: 105 mmol/L (ref 98–111)
Creatinine, Ser: 0.68 mg/dL (ref 0.44–1.00)
GFR calc Af Amer: >60 mL/min (ref >60)
GFR calc non Af Amer: >60 mL/min (ref >60)
Glucose, Bld: 162 mg/dL — ABNORMAL HIGH (ref 70–99)
Potassium: 3.7 mmol/L (ref 3.5–5.1)
Sodium: 139 mmol/L (ref 135–145)

## 2019-08-03 LAB — CBC
HCT: 44.5 % (ref 36.0–46.0)
Hemoglobin: 14.3 g/dL (ref 12.0–15.0)
MCH: 30.2 pg (ref 26.0–34.0)
MCHC: 32.1 g/dL (ref 30.0–36.0)
MCV: 93.9 fL (ref 80.0–100.0)
Platelets: 236 10*3/uL (ref 150–400)
RBC: 4.74 MIL/uL (ref 3.87–5.11)
RDW: 13.2 % (ref 11.5–15.5)
WBC: 11.3 10*3/uL — ABNORMAL HIGH (ref 4.0–10.5)
nRBC: 0 % (ref 0.0–0.2)

## 2019-08-03 MED ORDER — SENNOSIDES-DOCUSATE SODIUM 8.6-50 MG PO TABS: 1.0000 | ORAL_TABLET | Freq: Two times a day (BID) | ORAL | Status: DC

## 2019-08-03 MED ORDER — AMLODIPINE BESYLATE 10 MG PO TABS: 10.0000 mg | ORAL_TABLET | Freq: Every day | ORAL | Status: DC

## 2019-08-03 MED ORDER — ATORVASTATIN CALCIUM 20 MG PO TABS: 20.0000 mg | ORAL_TABLET | Freq: Every day | ORAL | Status: DC

## 2019-08-03 MED ORDER — STROKE: EARLY STAGES OF RECOVERY BOOK: 1.0000 | Freq: Once | Status: AC

## 2019-08-03 MED ORDER — QUETIAPINE FUMARATE 25 MG PO TABS: 12.5000 mg | ORAL_TABLET | Freq: Two times a day (BID) | ORAL | Status: DC

## 2019-08-03 NOTE — Progress Notes (Signed)
Discharge to SNF transported by [facility]

## 2019-08-03 NOTE — Plan of Care (Signed)
  Problem: Education: Goal: Knowledge of disease or condition will improve Outcome: Adequate for Discharge Goal: Knowledge of secondary prevention will improve Outcome: Adequate for Discharge Goal: Knowledge of patient specific risk factors addressed and post discharge goals established will improve Outcome: Adequate for Discharge Goal: Individualized Educational Video(s) Outcome: Adequate for Discharge   Problem: Coping: Goal: Will identify appropriate support needs Outcome: Adequate for Discharge   Problem: Health Behavior/Discharge Planning: Goal: Ability to manage health-related needs will improve Outcome: Adequate for Discharge   Problem: Self-Care: Goal: Ability to communicate needs accurately will improve Outcome: Adequate for Discharge   Problem: Nutrition: Goal: Risk of aspiration will decrease Outcome: Adequate for Discharge Goal: Dietary intake will improve Outcome: Adequate for Discharge   Problem: Intracerebral Hemorrhage Tissue Perfusion: Goal: Complications of Intracerebral Hemorrhage will be minimized Outcome: Adequate for Discharge

## 2019-08-03 NOTE — Progress Notes (Signed)
Report given to Delfin RN at [facility]

## 2019-08-03 NOTE — Discharge Summary (Signed)
. Physician Discharge Summary  [name] [name] VHQ:[id number] DOB: [birth year] DOA: 07/26/2019  PCP: Ernestine Conrad, MD  Admit date: 07/26/2019 Discharge date: 08/03/2019  Admitted From: [name] Disposition:  Discharged to [facility]  Recommendations for Outpatient Follow-up:  1. Follow up with PCP in 1 week 2. Please obtain BMP/CBC in one week  Discharge Condition: Stable  CODE STATUS: FULL   Brief/Interim Summary: On 07/26/2019 the patient was found by family incapacitated and she presented to the ED with right-sided weakness. CT scan showed left thalamic hemorrhage with intraventricular extension. She was subsequently admitted to the ICU. NS eval was not deemed necessary.  08/03/19: She is stable in good spirits this AM. Ok for discharge to [facility].   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 and left to right midline shift - now out of ICU, stable - apprecaite neuro assistance - SNF rec'd; bed secured, needs new COVID screen, ordered, awaiting results     - COVID screen negative; she is stable for discharge  DM2 - continue metformin, SSI - A1c 6.6     - continue home meds at discharge  HLD - LDL 120     - spoke with neuro; lipitor 20mg  qHS     - continue at discharge  Hypertensive urgency Essential HTN - continue lisinopril 20, amlodipine 10  Hyponatremia, now hypernatremia - encourage PO intake - continue gentle fluids     - resolved  Hyperkalemia - resolved, monitor  Dysphagia - puree, honey thick liquids     - continue w/u SLP  Discharge Instructions  Discharge  Instructions    Ambulatory referral to Neurology   Complete by: As directed    Follow up in stroke clinic at [facility] with Ihor Austin, NP in about 4 weeks. If not available, consider Dr. Delia Heady, Dr. Jamelle Rushing, or Dr. Naomie Dean.   For d/c to SNF at [facility]     Allergies as of 08/03/2019      Reactions   Contrast Media [iodinated Diagnostic Agents] Anaphylaxis      Medication List    STOP taking these medications   betamethasone valerate 0.1 % cream Commonly known as: VALISONE   clotrimazole 1 % cream Commonly known as: LOTRIMIN   dicyclomine 20 MG tablet Commonly known as: BENTYL   enalapril 10 MG tablet Commonly known as: VASOTEC   LORazepam 1 MG tablet Commonly known as: ATIVAN   oxyCODONE-acetaminophen 5-325 MG tablet Commonly known as: PERCOCET/ROXICET   traMADol 50 MG tablet Commonly known as: ULTRAM     TAKE these medications    stroke: mapping our early stages of recovery book Misc 1 each by Does not apply route once for 1 dose.   amLODipine 10 MG tablet Commonly known as: NORVASC Take 1 tablet (10 mg total) by mouth daily.   atorvastatin 20 MG tablet Commonly known as: LIPITOR Take 1 tablet (20 mg total) by mouth daily at 6 PM.   lisinopril 10 MG tablet Commonly known as: ZESTRIL Take 10 mg by mouth daily.   metFORMIN 500 MG tablet Commonly known as: GLUCOPHAGE Take 1 tablet by mouth 2 (two) times daily.   QUEtiapine 25  MG tablet Commonly known as: SEROQUEL Take 0.5 tablets (12.5 mg total) by mouth 2 (two) times daily.   senna-docusate 8.6-50 MG tablet Commonly known as: Senokot-S Take 1 tablet by mouth 2 (two) times daily.       Contact information for follow-up providers    [facility] Follow up in 4 week(s).   Specialty: Neurology Why: stroke clinic. office will call with appt date and time.  Contact information: [address] [place]  [address] [phone]           Contact information for after-discharge care    Destination    [facility] Preferred SNF .   Service: Skilled Nursing Contact information: [address] [place] [address] [phone]                 Allergies  Allergen Reactions  . Contrast Media [Iodinated Diagnostic Agents] Anaphylaxis    Consultations:  Neurology   Procedures/Studies: CT Head Wo Contrast  Result Date: 07/28/2019 CLINICAL DATA:  Stroke follow-up EXAM: CT HEAD WITHOUT CONTRAST TECHNIQUE: Contiguous axial images were obtained from the base of the skull through the vertex without intravenous contrast. COMPARISON:  07/27/2019 head CT FINDINGS: Brain: Unchanged intraparenchymal hematoma centered in the left thalamus with intraventricular extension into the left lateral ventricle. Small amount of blood layering in the right occipital horn. Minimal rightward midline shift is unchanged. There is no new site of hemorrhage. The edema surrounding the hemorrhage site is unchanged. Size and configuration of the ventricles are unchanged. Vascular: No hyperdense vessel or unexpected calcification. Skull: Normal. Negative for fracture or focal lesion. Sinuses/Orbits: No acute finding. Other: None IMPRESSION: 1. Unchanged size and appearance of left thalamic intraparenchymal hematoma with intraventricular extension. 2. Unchanged minimal rightward midline shift. Electronically Signed   By: Deatra Robinson M.D.   On: 07/28/2019 01:53   CT HEAD WO CONTRAST  Result Date: 07/27/2019 CLINICAL DATA:  Intracranial hemorrhage. Follow-up. EXAM: CT HEAD WITHOUT CONTRAST TECHNIQUE: Contiguous axial images were obtained from the base of the skull through the vertex without intravenous contrast. COMPARISON:  CT of the head without contrast 07/26/2019 FINDINGS: Brain: Left thalamic and posterior limb internal capsule hemorrhage is stable in size. Surrounding hypodensity is  consistent with expected progression of edema. There is increasing mass effect with partial effacement of the left lateral ventricle in 5 mm of midline shift. No new hemorrhage is present. Intraventricular hemorrhage is again seen. There is some hemorrhage posteriorly on the right. Vascular: Atherosclerotic calcifications are present within the cavernous internal carotid arteries bilaterally. There is no hyperdense vessel. Skull: Calvarium is intact. No focal lytic or blastic lesions are present. No significant extracranial soft tissue lesion is present. Sinuses/Orbits: The paranasal sinuses and mastoid air cells are clear. The right maxillary sinus is shrunken. The globes and orbits are within normal limits. IMPRESSION: 1. Stable size of left thalamic and posterior limb internal capsule hemorrhage with increasing surrounding edema, as expected. 2. Increasing mass effect with partial effacement of the left lateral ventricle and 5 mm of midline shift. 3. Stable intraventricular hemorrhage. Electronically Signed   By: Marin Roberts M.D.   On: 07/27/2019 12:26   MR MRA HEAD WO CONTRAST  Result Date: 07/31/2019 CLINICAL DATA:  Intracranial hemorrhage, follow-up EXAM: MRI HEAD WITHOUT CONTRAST MRA HEAD WITHOUT CONTRAST TECHNIQUE: Multiplanar, multiecho pulse sequences of the brain and surrounding structures were obtained without intravenous contrast. Angiographic images of the head were obtained using MRA technique without  contrast. COMPARISON:  None. FINDINGS: MRI HEAD FINDINGS Brain: Large area of parenchymal hemorrhage is again identified with involvement of the left thalamus and adjacent internal capsule/corona radiata. The hemorrhage demonstrates early subacute chronicity peripherally. Size is similar to the recent CT. Intraventricular extension is again noted. Edema surrounds the hemorrhage likely similar in extent to the prior CT with associated partial effacement of the left lateral ventricle as well as  the third ventricle. There is no evidence of trapping of the right lateral ventricle. Edema extends superiorly to the left postcentral gyrus along along a thin vein probably reflecting congestion related to distal compression by hematoma. Additional patchy and confluent T2 hyperintensity in the supratentorial white matter is nonspecific but may reflect moderate chronic microvascular ischemic changes. Punctate focus of susceptibility at the right dentate probably reflects mineralization. Vascular: Major vessel flow voids at the skull base are preserved. Skull and upper cervical spine: Normal marrow signal is preserved. Sinuses/Orbits: Paranasal sinuses are aerated. Orbits are unremarkable. Other: Sella is unremarkable.  Mastoid air cells are clear. MRA HEAD FINDINGS Intracranial internal carotid arteries are patent. Middle and anterior cerebral arteries are patent. Intracranial vertebral arteries, basilar artery, posterior cerebral arteries are patent. Bilateral posterior communicating arteries are present. Mild irregularity and stenosis of the left P1 PCA. No aneurysm. No abnormal flow related enhancement identified in the region of the hematoma, noting that there is intrinsic T1 shortening from the hemorrhage. IMPRESSION: Parenchymal hemorrhage involving the left thalamus and adjacent white matter with intraventricular extension. Associated mass effect is similar to recent CT. No hydrocephalus. Moderate chronic microvascular ischemic changes. Mild irregularity and stenosis of left P1 PCA. Otherwise unremarkable MRA of the head. Electronically Signed   By: Guadlupe SpanishPraneil  Patel M.D.   On: 07/31/2019 16:46   MR BRAIN WO CONTRAST  Result Date: 07/31/2019 CLINICAL DATA:  Intracranial hemorrhage, follow-up EXAM: MRI HEAD WITHOUT CONTRAST MRA HEAD WITHOUT CONTRAST TECHNIQUE: Multiplanar, multiecho pulse sequences of the brain and surrounding structures were obtained without intravenous contrast. Angiographic images of the  head were obtained using MRA technique without contrast. COMPARISON:  None. FINDINGS: MRI HEAD FINDINGS Brain: Large area of parenchymal hemorrhage is again identified with involvement of the left thalamus and adjacent internal capsule/corona radiata. The hemorrhage demonstrates early subacute chronicity peripherally. Size is similar to the recent CT. Intraventricular extension is again noted. Edema surrounds the hemorrhage likely similar in extent to the prior CT with associated partial effacement of the left lateral ventricle as well as the third ventricle. There is no evidence of trapping of the right lateral ventricle. Edema extends superiorly to the left postcentral gyrus along along a thin vein probably reflecting congestion related to distal compression by hematoma. Additional patchy and confluent T2 hyperintensity in the supratentorial white matter is nonspecific but may reflect moderate chronic microvascular ischemic changes. Punctate focus of susceptibility at the right dentate probably reflects mineralization. Vascular: Major vessel flow voids at the skull base are preserved. Skull and upper cervical spine: Normal marrow signal is preserved. Sinuses/Orbits: Paranasal sinuses are aerated. Orbits are unremarkable. Other: Sella is unremarkable.  Mastoid air cells are clear. MRA HEAD FINDINGS Intracranial internal carotid arteries are patent. Middle and anterior cerebral arteries are patent. Intracranial vertebral arteries, basilar artery, posterior cerebral arteries are patent. Bilateral posterior communicating arteries are present. Mild irregularity and stenosis of the left P1 PCA. No aneurysm. No abnormal flow related enhancement identified in the region of the hematoma, noting that there is intrinsic T1 shortening from the hemorrhage. IMPRESSION: Parenchymal hemorrhage  involving the left thalamus and adjacent white matter with intraventricular extension. Associated mass effect is similar to recent CT. No  hydrocephalus. Moderate chronic microvascular ischemic changes. Mild irregularity and stenosis of left P1 PCA. Otherwise unremarkable MRA of the head. Electronically Signed   By: Guadlupe Spanish M.D.   On: 07/31/2019 16:46   DG Swallowing Func-Speech Pathology  Result Date: 07/28/2019 Objective Swallowing Evaluation: Type of Study: MBS-Modified Barium Swallow Study  Patient Details Name: IRENE COLLINGS MRN: [id number] Date of Birth: August 06, 1932 Today's Date: 07/28/2019 Time: SLP Start Time (ACUTE ONLY): 1432 -SLP Stop Time (ACUTE ONLY): 1450 SLP Time Calculation (min) (ACUTE ONLY): 18 min Past Medical History: Past Medical History: Diagnosis Date . Arthritis  . Coronary artery disease   angina . Diabetes mellitus without complication (HCC)  . Hypertension  Past Surgical History: Past Surgical History: Procedure Laterality Date . APPENDECTOMY   . CHOLECYSTECTOMY   HPI: Pt is an 86 yo female presenting with R weakness, found to have large L thalamic hemorrhage. PMH: DM, HTN, CAD  Subjective: pt more alert today Assessment / Plan / Recommendation CHL IP CLINICAL IMPRESSIONS 07/28/2019 Clinical Impression Pt has a moderate oropharyngeal dysphagia as a result of CN VII weakness and reduced coordination. She has trouble achieving full labial seal and oral clearance on her R side. Mild anterior spillage occurs and pocketing increases as boluses become more solid. She has trouble following commands with attempted strategies, but she did attempt a lingual sweep that cleared most of the buccal residue after repeated efforts. Pt is capable of achieveing good laryngeal vestibule closure and pharyngeal clearance, but aspiration occurs with thin and nectar thick liquids when she has trouble coordinating her swallow. At times this means a bolus spills back to her pharynx prematurely. Other times it is because she is trying to manage a subsequent bolus orally and hasn't swallowed to clear her pharynx yet. This also happened when  she was trying to clear oral residue. Honey thick liquids and purees give allow her to properly close her airway before swallowing, even if her timing is off. Recommend starting Dys 1 diet and honey thick liquids. Full supervision is recommended given impulsive rate of intake and reduced awareness of pocketing. Will continue to follow acutely. SLP Visit Diagnosis Dysphagia, oropharyngeal phase (R13.12) Attention and concentration deficit following -- Frontal lobe and executive function deficit following -- Impact on safety and function Mild aspiration risk;Moderate aspiration risk   CHL IP TREATMENT RECOMMENDATION 07/28/2019 Treatment Recommendations Therapy as outlined in treatment plan below   Prognosis 07/28/2019 Prognosis for Safe Diet Advancement Good Barriers to Reach Goals Cognitive deficits;Language deficits Barriers/Prognosis Comment -- CHL IP DIET RECOMMENDATION 07/28/2019 SLP Diet Recommendations Dysphagia 1 (Puree) solids;Honey thick liquids Liquid Administration via Cup;Straw Medication Administration Crushed with puree Compensations Minimize environmental distractions;Slow rate;Small sips/bites;Lingual sweep for clearance of pocketing;Monitor for anterior loss;Other (Comment) Postural Changes Remain semi-upright after after feeds/meals (Comment);Seated upright at 90 degrees   CHL IP OTHER RECOMMENDATIONS 07/28/2019 Recommended Consults -- Oral Care Recommendations Oral care BID Other Recommendations Order thickener from pharmacy;Prohibited food (jello, ice cream, thin soups);Remove water pitcher   CHL IP FOLLOW UP RECOMMENDATIONS 07/28/2019 Follow up Recommendations Inpatient Rehab   CHL IP FREQUENCY AND DURATION 07/28/2019 Speech Therapy Frequency (ACUTE ONLY) min 2x/week Treatment Duration 2 weeks      CHL IP ORAL PHASE 07/28/2019 Oral Phase Impaired Oral - Pudding Teaspoon -- Oral - Pudding Cup -- Oral - Honey Teaspoon -- Oral - Honey Cup Right anterior  bolus loss;Decreased bolus cohesion;Delayed oral transit  Oral - Nectar Teaspoon -- Oral - Nectar Cup Right anterior bolus loss;Decreased bolus cohesion;Delayed oral transit Oral - Nectar Straw Right anterior bolus loss;Decreased bolus cohesion;Delayed oral transit Oral - Thin Teaspoon -- Oral - Thin Cup Right anterior bolus loss;Decreased bolus cohesion;Delayed oral transit Oral - Thin Straw Right anterior bolus loss;Decreased bolus cohesion;Delayed oral transit Oral - Puree Right anterior bolus loss;Decreased bolus cohesion;Delayed oral transit Oral - Mech Soft Right anterior bolus loss;Decreased bolus cohesion;Delayed oral transit;Right pocketing in lateral sulci Oral - Regular -- Oral - Multi-Consistency -- Oral - Pill -- Oral Phase - Comment --  CHL IP PHARYNGEAL PHASE 07/28/2019 Pharyngeal Phase Impaired Pharyngeal- Pudding Teaspoon -- Pharyngeal -- Pharyngeal- Pudding Cup -- Pharyngeal -- Pharyngeal- Honey Teaspoon -- Pharyngeal -- Pharyngeal- Honey Cup Delayed swallow initiation-pyriform sinuses Pharyngeal -- Pharyngeal- Nectar Teaspoon -- Pharyngeal -- Pharyngeal- Nectar Cup Delayed swallow initiation-pyriform sinuses;Penetration/Aspiration during swallow Pharyngeal Material enters airway, passes BELOW cords without attempt by patient to eject out (silent aspiration) Pharyngeal- Nectar Straw Delayed swallow initiation-pyriform sinuses;Penetration/Aspiration during swallow Pharyngeal Material enters airway, passes BELOW cords and not ejected out despite cough attempt by patient Pharyngeal- Thin Teaspoon -- Pharyngeal -- Pharyngeal- Thin Cup Delayed swallow initiation-pyriform sinuses;Penetration/Aspiration during swallow Pharyngeal Material enters airway, passes BELOW cords and not ejected out despite cough attempt by patient Pharyngeal- Thin Straw Delayed swallow initiation-pyriform sinuses;Penetration/Aspiration during swallow Pharyngeal Material enters airway, passes BELOW cords and not ejected out despite cough attempt by patient Pharyngeal- Puree WFL Pharyngeal  -- Pharyngeal- Mechanical Soft WFL Pharyngeal -- Pharyngeal- Regular -- Pharyngeal -- Pharyngeal- Multi-consistency -- Pharyngeal -- Pharyngeal- Pill -- Pharyngeal -- Pharyngeal Comment --  CHL IP CERVICAL ESOPHAGEAL PHASE 07/28/2019 Cervical Esophageal Phase WFL Pudding Teaspoon -- Pudding Cup -- Honey Teaspoon -- Honey Cup -- Nectar Teaspoon -- Nectar Cup -- Nectar Straw -- Thin Teaspoon -- Thin Cup -- Thin Straw -- Puree -- Mechanical Soft -- Regular -- Multi-consistency -- Pill -- Cervical Esophageal Comment -- Mahala Menghini., M.A. CCC-SLP Acute Rehabilitation Services Pager [phone] Office [phone] 07/28/2019, 3:25 PM              ECHOCARDIOGRAM COMPLETE  Result Date: 07/27/2019   ECHOCARDIOGRAM REPORT   Patient Name:   KEILANA MORLOCK Date of Exam: 07/27/2019 Medical Rec #:  [id number]           Height:       64.0 in Accession #:    [id number]          Weight:       173.0 lb Date of Birth:  05-21-1933           BSA:          1.84 m Patient Age:    86 years            BP:           131/59 mmHg Patient Gender: F                   HR:           76 bpm. Exam Location:  Inpatient Procedure: 2D Echo Indications:    Stroke 434.91/I  History:        Patient has no prior history of Echocardiogram examinations.                 CAD; Risk Factors:Diabetes and Hypertension.  Sonographer:    Ross Ludwig RDCS (AE) Referring Phys: [id number] PRAMOD S SETHI  Sonographer Comments: Suboptimal parasternal window. IMPRESSIONS  1. Left ventricular ejection fraction, by visual estimation, is 65 to 70%. The left ventricle has normal function. There is moderately increased left ventricular hypertrophy.  2. Left ventricular diastolic parameters are consistent with Grade I diastolic dysfunction (impaired relaxation).  3. Elevated left ventricular end-diastolic pressure.  4. Global right ventricle has normal systolic function.The right ventricular size is normal. No increase in right ventricular wall thickness.  5. Left atrial size was  normal.  6. Right atrial size was normal.  7. The mitral valve is normal in structure. No evidence of mitral valve regurgitation. No evidence of mitral stenosis.  8. The tricuspid valve is normal in structure.  9. The aortic valve was not well visualized. Aortic valve regurgitation is not visualized. Mild aortic valve sclerosis without stenosis. 10. The pulmonic valve was not well visualized. Pulmonic valve regurgitation is not visualized. 11. The inferior vena cava is normal in size with greater than 50% respiratory variability, suggesting right atrial pressure of 3 mmHg. 12. The interatrial septum was not well visualized. 13. Suboptimal image quality to exclude small valvular lesions. No definite cardiac source of embolism. Consider TEE if clinically indicated. FINDINGS  Left Ventricle: Left ventricular ejection fraction, by visual estimation, is 65 to 70%. The left ventricle has normal function. The left ventricle is not well visualized. There is moderately increased left ventricular hypertrophy. Left ventricular diastolic parameters are consistent with Grade I diastolic dysfunction (impaired relaxation). Elevated left ventricular end-diastolic pressure. Right Ventricle: The right ventricular size is normal. No increase in right ventricular wall thickness. Global RV systolic function is has normal systolic function. Left Atrium: Left atrial size was normal in size. Right Atrium: Right atrial size was normal in size Pericardium: There is no evidence of pericardial effusion. Mitral Valve: The mitral valve is normal in structure. No evidence of mitral valve regurgitation. No evidence of mitral valve stenosis by observation. MV peak gradient, 7.4 mmHg. Tricuspid Valve: The tricuspid valve is normal in structure. Tricuspid valve regurgitation is trivial. Aortic Valve: The aortic valve was not well visualized. Aortic valve regurgitation is not visualized. Mild aortic valve sclerosis is present, with no evidence of  aortic valve stenosis. Aortic valve mean gradient measures 6.0 mmHg. Aortic valve peak gradient measures 11.0 mmHg. Aortic valve area, by VTI measures 2.05 cm. Pulmonic Valve: The pulmonic valve was not well visualized. Pulmonic valve regurgitation is not visualized. Pulmonic regurgitation is not visualized. Aorta: The aortic root, ascending aorta and aortic arch are all structurally normal, with no evidence of dilitation or obstruction. Venous: The inferior vena cava is normal in size with greater than 50% respiratory variability, suggesting right atrial pressure of 3 mmHg. IAS/Shunts: The interatrial septum was not well visualized.  LEFT VENTRICLE PLAX 2D LVIDd:         3.52 cm  Diastology LVIDs:         2.28 cm  LV e' lateral:   5.25 cm/s LV PW:         1.62 cm  LV E/e' lateral: 19.8 LV IVS:        1.95 cm  LV e' medial:    4.13 cm/s LVOT diam:     2.10 cm  LV E/e' medial:  25.2 LV SV:         34 ml LV SV Index:   17.74 LVOT Area:     3.46 cm  RIGHT VENTRICLE          IVC RV Basal diam:  3.00 cm  IVC diam: 1.29 cm LEFT ATRIUM           Index       RIGHT ATRIUM           Index LA diam:      3.70 cm 2.01 cm/m  RA Area:     15.00 cm LA Vol (A2C): 53.4 ml 29.03 ml/m RA Volume:   40.20 ml  21.85 ml/m LA Vol (A4C): 43.9 ml 23.87 ml/m  AORTIC VALVE AV Area (Vmax):    2.34 cm AV Area (Vmean):   1.90 cm AV Area (VTI):     2.05 cm AV Vmax:           166.00 cm/s AV Vmean:          118.000 cm/s AV VTI:            0.341 m AV Peak Grad:      11.0 mmHg AV Mean Grad:      6.0 mmHg LVOT Vmax:         112.00 cm/s LVOT Vmean:        64.900 cm/s LVOT VTI:          0.202 m LVOT/AV VTI ratio: 0.59  AORTA Ao Root diam: 3.30 cm Ao Asc diam:  3.20 cm MITRAL VALVE MV Area (PHT): 2.56 cm              SHUNTS MV Peak grad:  7.4 mmHg              Systemic VTI:  0.20 m MV Mean grad:  2.0 mmHg              Systemic Diam: 2.10 cm MV Vmax:       1.36 m/s MV Vmean:      70.8 cm/s MV VTI:        0.38 m MV PHT:        85.84 msec MV Decel  Time: 296 msec MV E velocity: 104.00 cm/s 103 cm/s MV A velocity: 113.00 cm/s 70.3 cm/s MV E/A ratio:  0.92        1.5  Weston BrassGayatri Acharya MD Electronically signed by Weston BrassGayatri Acharya MD Signature Date/Time: 07/27/2019/11:39:51 PM    Final    CT HEAD CODE STROKE WO CONTRAST  Result Date: 07/26/2019 CLINICAL DATA:  Code stroke.  Right arm weakness and aphasia. EXAM: CT HEAD WITHOUT CONTRAST TECHNIQUE: Contiguous axial images were obtained from the base of the skull through the vertex without intravenous contrast. COMPARISON:  04/20/2018 FINDINGS: Brain: An acute hemorrhage centered in the left thalamus measures 3.2 x 4.0 x 2.6 cm (estimated volume of 17 mL) with mild surrounding edema and minimal localized rightward midline shift. There is intraventricular extension with a moderate volume of hemorrhage in the left lateral ventricle and small volume of hemorrhage in the other ventricles. There is no ventriculomegaly. No definite acute infarct is identified separate from the left thalamic hemorrhage. Hypodensities in the cerebral white matter bilaterally are unchanged and nonspecific but compatible with mild chronic small vessel ischemic disease. Small chronic infarcts are suspected in the inferior left and possibly right cerebellar hemispheres. There is no extra-axial fluid collection. Vascular: Calcified atherosclerosis at the skull base. No hyperdense vessel. Skull: No fracture suspicious osseous lesion. Sinuses/Orbits: Small right maxillary sinus. No acute inflammatory changes. Unremarkable orbits. Other: None. ASPECTS [facility]([place] Stroke Program Early CT Score) Not scored due to the presence of hemorrhage. IMPRESSION: 1. 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 2.  Mild chronic small vessel ischemic disease. Critical Value/emergent results were called by telephone at the time of interpretation on 07/26/2019 at 8:49 pm to Dr. Ritta Slot, who verbally acknowledged these results. Electronically  Signed   By: Sebastian Ache M.D.   On: 07/26/2019 20:56      Subjective: "Ok. Thank you."  Discharge Exam: Vitals:   08/02/19 2104 08/03/19 0434  BP: (!) 155/69 (!) 157/66  Pulse: 70 65  Resp: 18 18  Temp: 98.3 F (36.8 C) (!) 97.4 F (36.3 C)  SpO2: 97% 98%   Vitals:   08/02/19 0603 08/02/19 1525 08/02/19 2104 08/03/19 0434  BP: (!) 162/70 (!) 164/74 (!) 155/69 (!) 157/66  Pulse: 70 72 70 65  Resp: 17 17 18 18   Temp: 98 F (36.7 C) (!) 97.3 F (36.3 C) 98.3 F (36.8 C) (!) 97.4 F (36.3 C)  TempSrc: Axillary Oral Oral Oral  SpO2: 97% 99% 97% 98%    .General: 86 y.o. female resting in bed in NAD Eyes: PERRL, normal sclera Cardiovascular: RRR, +S1, S2, no m/g/r Respiratory: CTABL, no w/r/r, normal WOB GI: BS+, NDNT, no masses noted, no organomegaly noted, soft MSK: No e/c/c Neuro: Alert, r hemiparesis, r facial droop, dysarthria Psyc: calm/cooperative     The results of significant diagnostics from this hospitalization (including imaging, microbiology, ancillary and laboratory) are listed below for reference.     Microbiology: Recent Results (from the past 240 hour(s))  SARS CORONAVIRUS 2 (TAT 6-24 HRS) Nasopharyngeal Nasopharyngeal Swab     Status: None   Collection Time: 07/27/19  5:53 AM   Specimen: Nasopharyngeal Swab  Result Value Ref Range Status   SARS Coronavirus 2 NEGATIVE NEGATIVE Final    Comment: (NOTE) SARS-CoV-2 target nucleic acids are NOT DETECTED. The SARS-CoV-2 RNA is generally detectable in upper and lower respiratory specimens during the acute phase of infection. Negative results do not preclude SARS-CoV-2 infection, do not rule out co-infections with other pathogens, and should not be used as the sole basis for treatment or other patient management decisions. Negative results must be combined with clinical observations, patient history, and epidemiological information. The expected result is Negative. Fact Sheet for  Patients: HairSlick.no Fact Sheet for Healthcare Providers: quierodirigir.com This test is not yet approved or cleared by the [place] FDA and  has been authorized for detection and/or diagnosis of SARS-CoV-2 by FDA under an Emergency Use Authorization (EUA). This EUA will remain  in effect (meaning this test can be used) for the duration of the COVID-19 declaration under Section 56 4(b)(1) of the Act, 21 U.S.C. section 360bbb-3(b)(1), unless the authorization is terminated or revoked sooner. Performed at [facility], [address], [place], [place] [address]   MRSA PCR Screening     Status: None   Collection Time: 07/27/19  6:29 AM   Specimen: Nasal Mucosa; Nasopharyngeal  Result Value Ref Range Status   MRSA by PCR NEGATIVE NEGATIVE Final    Comment:        The GeneXpert MRSA Assay (FDA approved for NASAL specimens only), is one component of a comprehensive MRSA colonization surveillance program. It is not intended to diagnose MRSA infection nor to guide or monitor treatment for MRSA infections. Performed at [facility], [address] [place], [place] [address]   SARS CORONAVIRUS 2 (TAT 6-24 HRS) Nasopharyngeal Nasopharyngeal Swab     Status: None   Collection Time: 07/31/19  4:03 PM   Specimen: Nasopharyngeal Swab  Result Value Ref  Range Status   SARS Coronavirus 2 NEGATIVE NEGATIVE Final    Comment: (NOTE) SARS-CoV-2 target nucleic acids are NOT DETECTED. The SARS-CoV-2 RNA is generally detectable in upper and lower respiratory specimens during the acute phase of infection. Negative results do not preclude SARS-CoV-2 infection, do not rule out co-infections with other pathogens, and should not be used as the sole basis for treatment or other patient management decisions. Negative results must be combined with clinical observations, patient history, and epidemiological information. The  expected result is Negative. Fact Sheet for Patients: HairSlick.no Fact Sheet for Healthcare Providers: quierodirigir.com This test is not yet approved or cleared by the [place] FDA and  has been authorized for detection and/or diagnosis of SARS-CoV-2 by FDA under an Emergency Use Authorization (EUA). This EUA will remain  in effect (meaning this test can be used) for the duration of the COVID-19 declaration under Section 56 4(b)(1) of the Act, 21 U.S.C. section 360bbb-3(b)(1), unless the authorization is terminated or revoked sooner. Performed at [facility], [address], [place], [place] [address]   SARS CORONAVIRUS 2 (TAT 6-24 HRS) Nasopharyngeal Nasopharyngeal Swab     Status: None   Collection Time: 08/02/19  8:20 AM   Specimen: Nasopharyngeal Swab  Result Value Ref Range Status   SARS Coronavirus 2 NEGATIVE NEGATIVE Final    Comment: (NOTE) SARS-CoV-2 target nucleic acids are NOT DETECTED. The SARS-CoV-2 RNA is generally detectable in upper and lower respiratory specimens during the acute phase of infection. Negative results do not preclude SARS-CoV-2 infection, do not rule out co-infections with other pathogens, and should not be used as the sole basis for treatment or other patient management decisions. Negative results must be combined with clinical observations, patient history, and epidemiological information. The expected result is Negative. Fact Sheet for Patients: HairSlick.no Fact Sheet for Healthcare Providers: quierodirigir.com This test is not yet approved or cleared by the [place] FDA and  has been authorized for detection and/or diagnosis of SARS-CoV-2 by FDA under an Emergency Use Authorization (EUA). This EUA will remain  in effect (meaning this test can be used) for the duration of the COVID-19 declaration under Section 56  4(b)(1) of the Act, 21 U.S.C. section 360bbb-3(b)(1), unless the authorization is terminated or revoked sooner. Performed at [facility], [address], [place], [place] [address]      Labs: BNP (last 3 results) No results for input(s): BNP in the last 8760 hours. Basic Metabolic Panel: Recent Labs  Lab 07/30/19 0329 07/31/19 0249 08/01/19 0234 08/02/19 0143 08/03/19 0243  NA 139 146* 148* 142 139  K 3.7 3.7 3.7 3.6 3.7  CL 105 110 110 108 105  CO2 24 26 29 27 26   GLUCOSE 177* 165* 131* 199* 162*  BUN 21 23 21 21 19   CREATININE 0.85 0.70 0.67 0.62 0.68  CALCIUM 8.4* 8.8* 8.6* 8.7* 8.4*  MG  --   --   --  2.0  --    Liver Function Tests: No results for input(s): AST, ALT, ALKPHOS, BILITOT, PROT, ALBUMIN in the last 168 hours. No results for input(s): LIPASE, AMYLASE in the last 168 hours. No results for input(s): AMMONIA in the last 168 hours. CBC: Recent Labs  Lab 07/30/19 0329 07/31/19 0249 08/01/19 0234 08/02/19 0143 08/03/19 0243  WBC 10.3 9.1 9.6 9.2 11.3*  HGB 14.3 14.3 14.5 14.2 14.3  HCT 43.5 44.5 45.1 44.6 44.5  MCV 91.8 94.1 93.8 95.3 93.9  PLT 211 206  213 217 236   Cardiac Enzymes: No results for input(s): CKTOTAL, CKMB, CKMBINDEX, TROPONINI in the last 168 hours. BNP: Invalid input(s): POCBNP CBG: Recent Labs  Lab 08/02/19 0751 08/02/19 1220 08/02/19 1800 08/02/19 2102 08/03/19 0734  GLUCAP 150* 169* 236* 192* 147*   D-Dimer No results for input(s): DDIMER in the last 72 hours. Hgb A1c No results for input(s): HGBA1C in the last 72 hours. Lipid Profile No results for input(s): CHOL, HDL, LDLCALC, TRIG, CHOLHDL, LDLDIRECT in the last 72 hours. Thyroid function studies No results for input(s): TSH, T4TOTAL, T3FREE, THYROIDAB in the last 72 hours.  Invalid input(s): FREET3 Anemia work up No results for input(s): VITAMINB12, FOLATE, FERRITIN, TIBC, IRON, RETICCTPCT in the last 72 hours. Urinalysis    Component Value Date/Time    COLORURINE Yellow 07/11/2013 1732   APPEARANCEUR Cloudy 07/11/2013 1732   LABSPEC 1.012 07/11/2013 1732   PHURINE 6.0 07/11/2013 1732   GLUCOSEU 50 mg/dL 63/87/5643 3295   HGBUR 1+ 07/11/2013 1732   BILIRUBINUR Negative 07/11/2013 1732   KETONESUR Negative 07/11/2013 1732   PROTEINUR 100 mg/dL 18/84/1660 6301   NITRITE Negative 07/11/2013 1732   LEUKOCYTESUR 1+ 07/11/2013 1732   Sepsis Labs Invalid input(s): PROCALCITONIN,  WBC,  LACTICIDVEN Microbiology Recent Results (from the past 240 hour(s))  SARS CORONAVIRUS 2 (TAT 6-24 HRS) Nasopharyngeal Nasopharyngeal Swab     Status: None   Collection Time: 07/27/19  5:53 AM   Specimen: Nasopharyngeal Swab  Result Value Ref Range Status   SARS Coronavirus 2 NEGATIVE NEGATIVE Final    Comment: (NOTE) SARS-CoV-2 target nucleic acids are NOT DETECTED. The SARS-CoV-2 RNA is generally detectable in upper and lower respiratory specimens during the acute phase of infection. Negative results do not preclude SARS-CoV-2 infection, do not rule out co-infections with other pathogens, and should not be used as the sole basis for treatment or other patient management decisions. Negative results must be combined with clinical observations, patient history, and epidemiological information. The expected result is Negative. Fact Sheet for Patients: HairSlick.no Fact Sheet for Healthcare Providers: quierodirigir.com This test is not yet approved or cleared by the [place] FDA and  has been authorized for detection and/or diagnosis of SARS-CoV-2 by FDA under an Emergency Use Authorization (EUA). This EUA will remain  in effect (meaning this test can be used) for the duration of the COVID-19 declaration under Section 56 4(b)(1) of the Act, 21 U.S.C. section 360bbb-3(b)(1), unless the authorization is terminated or revoked sooner. Performed at [facility], [address]  [place], [place] [address]   MRSA PCR Screening     Status: None   Collection Time: 07/27/19  6:29 AM   Specimen: Nasal Mucosa; Nasopharyngeal  Result Value Ref Range Status   MRSA by PCR NEGATIVE NEGATIVE Final    Comment:        The GeneXpert MRSA Assay (FDA approved for NASAL specimens only), is one component of a comprehensive MRSA colonization surveillance program. It is not intended to diagnose MRSA infection nor to guide or monitor treatment for MRSA infections. Performed at [facility], [address] [place], [place] [address]   SARS CORONAVIRUS 2 (TAT 6-24 HRS) Nasopharyngeal Nasopharyngeal Swab     Status: None   Collection Time: 07/31/19  4:03 PM   Specimen: Nasopharyngeal Swab  Result Value Ref Range Status   SARS Coronavirus 2 NEGATIVE NEGATIVE Final    Comment: (NOTE) SARS-CoV-2 target nucleic acids are NOT DETECTED. The SARS-CoV-2 RNA is generally  detectable in upper and lower respiratory specimens during the acute phase of infection. Negative results do not preclude SARS-CoV-2 infection, do not rule out co-infections with other pathogens, and should not be used as the sole basis for treatment or other patient management decisions. Negative results must be combined with clinical observations, patient history, and epidemiological information. The expected result is Negative. Fact Sheet for Patients: SugarRoll.be Fact Sheet for Healthcare Providers: https://www.woods-mathews.com/ This test is not yet approved or cleared by the [place] FDA and  has been authorized for detection and/or diagnosis of SARS-CoV-2 by FDA under an Emergency Use Authorization (EUA). This EUA will remain  in effect (meaning this test can be used) for the duration of the COVID-19 declaration under Section 56 4(b)(1) of the Act, 21 U.S.C. section 360bbb-3(b)(1), unless the authorization is terminated or revoked sooner. Performed at [facility], [place] [address] [place], [place] [address]   SARS CORONAVIRUS 2 (TAT 6-24 HRS) Nasopharyngeal Nasopharyngeal Swab     Status: None   Collection Time: 08/02/19  8:20 AM   Specimen: Nasopharyngeal Swab  Result Value Ref Range Status   SARS Coronavirus 2 NEGATIVE NEGATIVE Final    Comment: (NOTE) SARS-CoV-2 target nucleic acids are NOT DETECTED. The SARS-CoV-2 RNA is generally detectable in upper and lower respiratory specimens during the acute phase of infection. Negative results do not preclude SARS-CoV-2 infection, do not rule out co-infections with other pathogens, and should not be used as the sole basis for treatment or other patient management decisions. Negative results must be combined with clinical observations, patient history, and epidemiological information. The expected result is Negative. Fact Sheet for Patients: SugarRoll.be Fact Sheet for Healthcare Providers: https://www.woods-mathews.com/ This test is not yet approved or cleared by the [place] FDA and  has been authorized for detection and/or diagnosis of SARS-CoV-2 by FDA under an Emergency Use Authorization (EUA). This EUA will remain  in effect (meaning this test can be used) for the duration of the COVID-19 declaration under Section 56 4(b)(1) of the Act, 21 U.S.C. section 360bbb-3(b)(1), unless the authorization is terminated or revoked sooner. Performed at [facility], [place] [address], [place], [place] [address]      Time coordinating discharge: 35 minutes  SIGNED:   Jonnie Finner, DO  [facility] 08/03/2019, 7:57 AM   If 7PM-7AM, please contact night-coverage [email or web address]

## 2019-08-03 NOTE — Progress Notes (Signed)
Pt alert, orientedx2 to person. VS: Temp- 98.3, BP- 155/69 (95), HR- 70, RR-19, Sp02- 97% RA. Blood sugar of 192. Pt has been refusing all scheduled medications for this shift despite education of the importance of taking them. MD on call was notified with situation, will continue to monitor with remainder of shift.

## 2019-08-03 NOTE — Social Work (Signed)
Clinical Social Worker facilitated patient discharge including contacting patient family and facility to confirm patient discharge plans.  Clinical information faxed to facility and family agreeable with plan.  CSW arranged ambulance transport via [facility] to [facility] room 150.  RN to call [phone]  with report prior to discharge.  Clinical Social Worker will sign off for now as social work intervention is no longer needed. Please consult [place] again if new need arises.  Octavio Graves, MSW, [employer] Clinical Social Worker

## 2019-08-03 NOTE — Progress Notes (Signed)
Physical Therapy Treatment Patient Details Name: [name] [name] MRN: [id number] DOB: Jun 14, 1933 Today's Date: 08/03/2019    History of Present Illness Pt is an 86 y.o. female with a history of diabetes, hypertension, coronary artery disease who presented 07/26/19 with right-sided weakness and BP 200/99; CT head large thalamic hemorrhage on the left; repeat head CT 1/5 showed thalamic hemorrhage stable in size, with increased surrounding edema, including 5 mm midline shift    PT Comments    Pt pleasant with ability to nod head yes to hospital and name. Pt participating with rolling but all other mobility pt resistant to moving trunk off bed and sitting. Due to pt max +2 and resistant further mobility should focus on rolling and maximove to sitting to improve pt tolerance for upright and for sitting balance in supported option of sling. D/C to SNF remains appropriate.    Follow Up Recommendations  SNF;Supervision/Assistance - 24 hour     Equipment Recommendations       Recommendations for Other Services       Precautions / Restrictions Precautions Precautions: Fall    Mobility  Bed Mobility Overal bed mobility: Needs Assistance Bed Mobility: Rolling;Supine to Sit;Sit to Supine Rolling: Mod assist;Max assist Sidelying to sit: Max assist;+2 for physical assistance   Sit to supine: Total assist   General bed mobility comments: mod assist with use of rail and cues for reaching for rail and bending left knee to roll right. Rolling left with max assist. Transition from sidely to sitting pt with initial resistance and required HOB elevated and max +2 to rise to sitting. Once in sitting pt would not assist with LUE positioning on foot board and would actually move LUE away from anywhere it was placed, Pt with posterior right lean and resistant to sitting. EOB 3 min.  Transfers                 General transfer comment: unsafe, defer to hoyer lift  Ambulation/Gait                 Stairs             Wheelchair Mobility    Modified Rankin (Stroke Patients Only) Modified Rankin (Stroke Patients Only) Pre-Morbid Rankin Score: No symptoms Modified Rankin: Severe disability     Balance Overall balance assessment: Needs assistance Sitting-balance support: Single extremity supported;No upper extremity supported Sitting balance-Leahy Scale: Zero   Postural control: Posterior lean;Right lateral lean                                  Cognition Arousal/Alertness: Awake/alert Behavior During Therapy: Flat affect Overall Cognitive Status: Difficult to assess Area of Impairment: Following commands;Safety/judgement;Problem solving                       Following Commands: Follows one step commands inconsistently;Follows one step commands with increased time Safety/Judgement: Decreased awareness of safety;Decreased awareness of deficits   Problem Solving: Slow processing;Decreased initiation;Difficulty sequencing;Requires verbal cues;Requires tactile cues General Comments: expressive aphasia      Exercises      General Comments        Pertinent Vitals/Pain Faces Pain Scale: Hurts a little bit Pain Location: grimace and resistance to sitting Pain Intervention(s): Limited activity within patient's tolerance;Monitored during session;Repositioned    Home Living  Prior Function            PT Goals (current goals can now be found in the care plan section) Progress towards PT goals: Not progressing toward goals - comment    Frequency    Min 2X/week      PT Plan Current plan remains appropriate    Co-evaluation              AM-PAC PT "6 Clicks" Mobility   Outcome Measure  Help needed turning from your back to your side while in a flat bed without using bedrails?: Total Help needed moving from lying on your back to sitting on the side of a flat bed without using  bedrails?: Total Help needed moving to and from a bed to a chair (including a wheelchair)?: Total Help needed standing up from a chair using your arms (e.g., wheelchair or bedside chair)?: Total Help needed to walk in hospital room?: Total Help needed climbing 3-5 steps with a railing? : Total 6 Click Score: 6    End of Session   Activity Tolerance: Patient tolerated treatment well Patient left: in bed;with call bell/phone within reach;with family/visitor present(bed alarm would not turn on and RN made awaree) Nurse Communication: Mobility status;Need for lift equipment PT Visit Diagnosis: Hemiplegia and hemiparesis;Other abnormalities of gait and mobility (R26.89)     Time: 5638-7564 PT Time Calculation (min) (ACUTE ONLY): 13 min  Charges:  $Therapeutic Activity: 8-22 mins                     [name] [name] P, PT Acute Rehabilitation Services Pager: [phone] Office: [phone]    [name] [name] B [name] [name] 08/03/2019, 10:29 AM

## 2019-08-03 NOTE — TOC Transition Note (Signed)
Transition of Care [facility]) - CM/SW Discharge Note   Patient Details  Name: [name] [name] MRN: [id number] Date of Birth: 1933/04/18  Transition [facility] CM/SW Contact:  Doy Hutching, LCSWA Phone Number: 08/03/2019, 10:39 AM   Clinical Narrative:    Pt negative COVID has resulted, per MD stable for SNF transfer.  [facility] ready for pt to admit today. Message left for pt grandson Kathlene November. Will arrange [facility] after I hear back from him.  Clinical Social Worker facilitated patient discharge including contacting patient family and facility to confirm patient discharge plans.  Clinical information faxed to facility and family agreeable with plan.  CSW arranged ambulance transport via [facility] to [facility] RN to call [phone]  with report prior to discharge.  Clinical Social Worker will sign off for now as social work intervention is no longer needed. Please consult [place] again if new need arises.   Final next level of care: Skilled Nursing Facility Barriers to Discharge: Barriers Resolved   Patient Goals and CMS Choice Patient states their goals for this hospitalization and ongoing recovery are:: for him to get the help she needs CMS Medicare.gov Compare Post Acute Care list provided to:: Patient Represenative (must comment) Choice offered to / list presented to : (pt grandson Kathlene November)  Discharge Placement PASRR number recieved: 07/29/19            Patient chooses bed at: [facility] Patient to be transferred to facility by: [facility] Name of family member notified: pt grandson Kathlene November via telephone Patient and family notified of of transfer: 08/03/19  Discharge Plan and Services In-house Referral: Clinical Social Work Discharge Planning Services: CM Consult Post Acute Care Choice: Skilled Nursing Facility             Readmission Risk Interventions Readmission Risk Prevention Plan 07/30/2019  Post Dischage Appt Not Complete  Appt Comments plan for SNF  Medication  Screening Complete  Transportation Screening Complete  Some recent data might be hidden

## 2019-08-04 ENCOUNTER — Encounter: Payer: Self-pay | Admitting: Adult Health

## 2019-08-04 ENCOUNTER — Non-Acute Institutional Stay (SKILLED_NURSING_FACILITY): Payer: Medicare Other | Admitting: Adult Health

## 2019-08-04 DIAGNOSIS — I69391 Dysphagia following cerebral infarction: Secondary | ICD-10-CM

## 2019-08-04 DIAGNOSIS — G936 Cerebral edema: Secondary | ICD-10-CM | POA: Diagnosis not present

## 2019-08-04 DIAGNOSIS — G8191 Hemiplegia, unspecified affecting right dominant side: Secondary | ICD-10-CM | POA: Insufficient documentation

## 2019-08-04 DIAGNOSIS — E1169 Type 2 diabetes mellitus with other specified complication: Secondary | ICD-10-CM

## 2019-08-04 DIAGNOSIS — K5909 Other constipation: Secondary | ICD-10-CM | POA: Diagnosis not present

## 2019-08-04 DIAGNOSIS — E1165 Type 2 diabetes mellitus with hyperglycemia: Secondary | ICD-10-CM

## 2019-08-04 DIAGNOSIS — E1159 Type 2 diabetes mellitus with other circulatory complications: Secondary | ICD-10-CM

## 2019-08-04 DIAGNOSIS — I615 Nontraumatic intracerebral hemorrhage, intraventricular: Secondary | ICD-10-CM | POA: Diagnosis not present

## 2019-08-04 DIAGNOSIS — I10 Essential (primary) hypertension: Secondary | ICD-10-CM

## 2019-08-04 DIAGNOSIS — E782 Mixed hyperlipidemia: Secondary | ICD-10-CM | POA: Insufficient documentation

## 2019-08-04 DIAGNOSIS — I152 Hypertension secondary to endocrine disorders: Secondary | ICD-10-CM

## 2019-08-04 DIAGNOSIS — I69351 Hemiplegia and hemiparesis following cerebral infarction affecting right dominant side: Secondary | ICD-10-CM

## 2019-08-04 DIAGNOSIS — E785 Hyperlipidemia, unspecified: Secondary | ICD-10-CM | POA: Insufficient documentation

## 2019-08-04 NOTE — Progress Notes (Signed)
Location:    [facility] Room Number: 150P Place of Service:  SNF (31) Carleene Overlie NP    CODE STATUS: DNR  Allergies  Allergen Reactions  . Contrast Media [Iodinated Diagnostic Agents] Anaphylaxis    Chief Complaint  Patient presents with  . Hospitalization Follow-up    Hospitalization follow up    HPI:  She is a 86 year old woman who has been hospitalized from 07-26-19 through 08-03-19. 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  Her speech is garbled. She is aware. There are no reports of uncontrolled pain; no reports of changes in appetite; no signs of aspiration present. She is here for short term rehab. She will continue to be followed for her chronic illnesses including: diabetes; hypertension; dysphagia.   Past Medical History:  Diagnosis Date  . Arthritis   . Coronary artery disease    angina  . Diabetes mellitus without complication (HCC)   . Hypertension     Past Surgical History:  Procedure Laterality Date  . APPENDECTOMY    . CHOLECYSTECTOMY      Social History   Socioeconomic History  . Marital status: Widowed    Spouse name: Not on file  . Number of children: Not on file  . Years of education: Not on file  . Highest education level: Not on file  Occupational History  . Not on file  Tobacco Use  . Smoking status: Never Smoker  . Smokeless tobacco: Never Used  Substance and Sexual Activity  . Alcohol use: No  . Drug use: No  . Sexual activity: Not on file  Other Topics Concern  . Not on file  Social History Narrative  . Not on file   Social Determinants of Health   Financial Resource Strain:   . Difficulty of Paying Living Expenses: Not on file  Food Insecurity:   . Worried About Programme researcher, broadcasting/film/video in the Last Year: Not on file  . Ran Out of Food in the Last Year: Not on file    Transportation Needs:   . Lack of Transportation (Medical): Not on file  . Lack of Transportation (Non-Medical): Not on file  Physical Activity:   . Days of Exercise per Week: Not on file  . Minutes of Exercise per Session: Not on file  Stress:   . Feeling of Stress : Not on file  Social Connections:   . Frequency of Communication with Friends and Family: Not on file  . Frequency of Social Gatherings with Friends and Family: Not on file  . Attends Religious Services: Not on file  . Active Member of Clubs or Organizations: Not on file  . Attends Banker Meetings: Not on file  . Marital Status: Not on file  Intimate Partner Violence:   . Fear of Current or Ex-Partner: Not on file  . Emotionally Abused: Not on file  . Physically Abused: Not on file  . Sexually Abused: Not on file   History reviewed. No pertinent family history.    VITAL SIGNS BP 125/61   Pulse 67   Temp (!) 97.1 F (36.2 C) (Oral)   Resp 20   Ht 5\' 4"  (1.626 m)   Wt 173 lb (78.5 kg)   BMI 29.70 kg/m   Outpatient Encounter Medications as of 08/04/2019  Medication Sig  .  amLODipine (NORVASC) 10 MG tablet Take 1 tablet (10 mg total) by mouth daily.  [name] atorvastatin (LIPITOR) 20 MG tablet Take 1 tablet (20 mg total) by mouth daily at 6 PM.  . lisinopril (ZESTRIL) 10 MG tablet Take 10 mg by mouth daily.  . metFORMIN (GLUCOPHAGE) 500 MG tablet Take 1 tablet by mouth 2 (two) times daily.  . NON FORMULARY Diet - PUREED DIET. Liquids: ___Regular; ___Thickened ___ Consistency: ___ Nectar, _X__Honey, ___ Pudding; ____ Fluid Restriction. Pureed diet Special Instructions: PUREED Consistency with HONEY THICK LIQUIDS Pureed diet with honey thick liquids  . QUEtiapine (SEROQUEL) 25 MG tablet Take 0.5 tablets (12.5 mg total) by mouth 2 (two) times daily.  [name] senna-docusate (SENOKOT-S) 8.6-50 MG tablet Take 1 tablet by mouth 2 (two) times daily.   No facility-administered encounter medications on file as of  08/04/2019.     SIGNIFICANT DIAGNOSTIC EXAMS  TODAY  07-26-19: ct of head: 1. 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 2. Mild chronic small vessel ischemic disease.  07-27-19: ct of head:  1. Stable size of left thalamic and posterior limb internal capsule hemorrhage with increasing surrounding edema, as expected. 2. Increasing mass effect with partial effacement of the left lateral ventricle and 5 mm of midline shift. 3. Stable intraventricular hemorrhage.  07-26-18: 2-d echo:  Left Ventricle: Left ventricular ejection fraction, by visual estimation, is 65 to 70%. The left ventricle has normal function. The left ventricle is not well visualized. There is moderately increased left ventricular hypertrophy. Left ventricular  diastolic parameters are consistent with Grade I diastolic dysfunction (impaired relaxation). Elevated left ventricular end-diastolic pressure.   Right Ventricle: The right ventricular size is normal. No increase in right ventricular wall thickness. Global RV systolic function is has normal systolic function. Left Atrium: Left atrial size was normal in size. Right Atrium: Right atrial size was normal in size Pericardium: There is no evidence of pericardial effusion. Mitral Valve: The mitral valve is normal in structure. No evidence of mitral valve regurgitation. No evidence of mitral valve stenosis by observation. MV peak gradient, 7.4 mmHg. Tricuspid Valve: The tricuspid valve is normal in structure. Tricuspid valve regurgitation is trivial.   Aortic Valve: The aortic valve was not well visualized. Aortic valve regurgitation is not visualized. Mild aortic valve sclerosis is present, with no evidence of aortic valve stenosis. Aortic valve mean gradient measures 6.0 mmHg. Aortic valve peak  gradient measures 11.0 mmHg. Aortic valve area, by VTI measures 2.05 cm.  07-28-19: ct of head: 1. Unchanged size and appearance of left thalamic intraparenchymal  hematoma with intraventricular extension. 2. Unchanged minimal rightward midline shift.  07-28-19: swallow study: Dysphagia 1 (Puree) solids;Honey thick liquids   07-31-19: MRI/MRA of head:  Parenchymal hemorrhage involving the left thalamus and adjacent white matter with intraventricular extension. Associated mass effect is similar to recent CT. No hydrocephalus. Moderate chronic microvascular ischemic changes. Mild irregularity and stenosis of left P1 PCA. Otherwise unremarkable MRA of the head.  LABS REVIEWED TODAY  07-26-19: wbc 6.9; hgb 15.5; hct 45.8; mcv 91.4 plt 208; glucose 227; bun 15 creat 0.60; k+ 7.9; na++ 132; ca 8.9; liver normal albumin 3.2 hgb a1c 6.6 07-27-19: chol 205; ldl 120; trig 166; hdl 53 6-0-45: wbc 10.3; hgb 14.3; hct 43.5; mcv 91.8 plt 211; glucose 177; bun 21; creat 0.85; k+ 3.7; na++ 139; ca 8.4 08-01-19: wbc 9.6; hgb 14.5; ht 45.1; mcv 93.8 plt 213; glucose 131; bun 21; creat 0.67; k+ 3.7; na++ 148 ca 8.6 08-02-19:  wbc 9.2; hgb 14.2; hct 44.6; mcv 95.3 plt 217; glucose 199; bun 21; creat 0.62; k+ 3.6; na++ 142; ca 8.7; mag 2.0 08-03-19: wbc 11.3; hgb 14.3; hct 44.5; mcv 93.9; plt 236; glucose 162; bun 19; creat 0.68; k+ 3.7; na++ 139; ca 8.4     Review of Systems  Unable to perform ROS: Language (speech is garbled )    Physical Exam Constitutional:      General: She is not in acute distress.    Appearance: She is well-developed. She is not diaphoretic.  Neck:     Thyroid: No thyromegaly.  Cardiovascular:     Rate and Rhythm: Normal rate and regular rhythm.     Pulses: Normal pulses.     Heart sounds: Normal heart sounds.  Pulmonary:     Effort: Pulmonary effort is normal. No respiratory distress.     Breath sounds: Normal breath sounds.  Abdominal:     General: Bowel sounds are normal. There is no distension.     Palpations: Abdomen is soft.     Tenderness: There is no abdominal tenderness.  Musculoskeletal:     Cervical back: Neck supple.     Right  lower leg: No edema.     Left lower leg: No edema.     Comments: Does not move right side extremities   Lymphadenopathy:     Cervical: No cervical adenopathy.  Skin:    General: Skin is warm and dry.  Neurological:     Mental Status: She is alert. Mental status is at baseline.  Psychiatric:        Mood and Affect: Mood normal.        ASSESSMENT/ PLAN:  TODAY  1. IVH (intraventricular hemorrhage) / cytotoxic brain edema /  Hemiplegia right side: is neurologically stable: will wean off seroquel to every hs for 7 days then stop. Will monitor her status. Will continue therapy as directed.   2. Dyslipidemia associated with type 2 diabetes mellitus: is stable LDL 120 will continue lipitor 20 mg daily   3. Chronic constipation: is stable will continue senna s twice daily   4. Dysphagia due to recent stroke: no signs of aspiration present will continue honey thick liquids  5. Hypertension associated with type 2 diabetes mellitus: is stable b/p 125/61 will continue norvasc 10 mg daily lisinopril 10 mg daily   6. Controlled type 2 diabetes mellitus with hyperglycemia without long term current use of insulin: is stable hgb a1c 6.6 will continue metformin 500 mg twice daily is on ace and statin   MD is aware of resident's narcotic use and is in agreement with current plan of care. We will attempt to wean resident as appropriate.  Ok Edwards NP [facility]  Contact [phone] Monday through Friday 8am- 5pm  After hours call [phone]

## 2019-08-05 ENCOUNTER — Encounter: Payer: Self-pay | Admitting: Adult Health

## 2019-08-05 ENCOUNTER — Non-Acute Institutional Stay (SKILLED_NURSING_FACILITY): Payer: Medicare Other | Admitting: Adult Health

## 2019-08-05 DIAGNOSIS — I69351 Hemiplegia and hemiparesis following cerebral infarction affecting right dominant side: Secondary | ICD-10-CM

## 2019-08-05 DIAGNOSIS — G936 Cerebral edema: Secondary | ICD-10-CM

## 2019-08-05 DIAGNOSIS — I615 Nontraumatic intracerebral hemorrhage, intraventricular: Secondary | ICD-10-CM | POA: Diagnosis not present

## 2019-08-05 NOTE — Progress Notes (Signed)
Location:    [facility] Room Number: 150P Place of Service:  SNF (31) Carleene Overlie NP    CODE STATUS: DNR  Allergies  Allergen Reactions  . Contrast Media [Iodinated Diagnostic Agents] Anaphylaxis    Chief Complaint  Patient presents with  . Acute Visit    72 hr Care Plan Meeting    HPI:  We have come together for her 72 hour care plan meeting. She had been independent with her adls prior to her stroke. She is now totally dependent with her adl care. More than likely she will need long term skilled nursing. Her family would like for her graduate to assisted living if able. There are no reports of uncontrolled pain; no reports of aspiration or choking. No reports of agitation.    Past Medical History:  Diagnosis Date  . Arthritis   . Coronary artery disease    angina  . Diabetes mellitus without complication (HCC)   . Hypertension     Past Surgical History:  Procedure Laterality Date  . APPENDECTOMY    . CHOLECYSTECTOMY      Social History   Socioeconomic History  . Marital status: Widowed    Spouse name: Not on file  . Number of children: Not on file  . Years of education: Not on file  . Highest education level: Not on file  Occupational History  . Not on file  Tobacco Use  . Smoking status: Never Smoker  . Smokeless tobacco: Never Used  Substance and Sexual Activity  . Alcohol use: No  . Drug use: No  . Sexual activity: Not on file  Other Topics Concern  . Not on file  Social History Narrative  . Not on file   Social Determinants of Health   Financial Resource Strain:   . Difficulty of Paying Living Expenses: Not on file  Food Insecurity:   . Worried About Programme researcher, broadcasting/film/video in the Last Year: Not on file  . Ran Out of Food in the Last Year: Not on file  Transportation Needs:   . Lack of Transportation (Medical): Not on file  . Lack of Transportation (Non-Medical): Not on file  Physical Activity:   . Days of Exercise  per Week: Not on file  . Minutes of Exercise per Session: Not on file  Stress:   . Feeling of Stress : Not on file  Social Connections:   . Frequency of Communication with Friends and Family: Not on file  . Frequency of Social Gatherings with Friends and Family: Not on file  . Attends Religious Services: Not on file  . Active Member of Clubs or Organizations: Not on file  . Attends Banker Meetings: Not on file  . Marital Status: Not on file  Intimate Partner Violence:   . Fear of Current or Ex-Partner: Not on file  . Emotionally Abused: Not on file  . Physically Abused: Not on file  . Sexually Abused: Not on file   History reviewed. No pertinent family history.    VITAL SIGNS BP (!) 128/59   Pulse 81   Temp (!) 97 F (36.1 C) (Oral)   Resp 20   Ht 5\' 4"  (1.626 m)   Wt 173 lb (78.5 kg)   BMI 29.70 kg/m   Outpatient Encounter Medications as of 08/05/2019  Medication Sig  . amLODipine (NORVASC) 10 MG tablet Take 1 tablet (10 mg total) by mouth daily.  08/07/2019 atorvastatin (LIPITOR) 20 MG tablet  Take 1 tablet (20 mg total) by mouth daily at 6 PM.  . lisinopril (ZESTRIL) 10 MG tablet Take 10 mg by mouth daily.  . metFORMIN (GLUCOPHAGE) 500 MG tablet Take 1 tablet by mouth 2 (two) times daily.  . NON FORMULARY Diet - PUREED DIET. Liquids: ___Regular; ___Thickened ___ Consistency: ___ Nectar, _X__Honey, ___ Pudding; ____ Fluid Restriction. Pureed diet Special Instructions: PUREED Consistency with HONEY THICK LIQUIDS Pureed diet with honey thick liquids  . QUEtiapine (SEROQUEL) 25 MG tablet Take 0.5 tablets (12.5 mg total) by mouth 2 (two) times daily.  [name] senna-docusate (SENOKOT-S) 8.6-50 MG tablet Take 1 tablet by mouth 2 (two) times daily.   No facility-administered encounter medications on file as of 08/05/2019.     SIGNIFICANT DIAGNOSTIC EXAMS  PREVIOUS   07-26-19: ct of head: 1. 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 2. Mild chronic small  vessel ischemic disease.  07-27-19: ct of head:  1. Stable size of left thalamic and posterior limb internal capsule hemorrhage with increasing surrounding edema, as expected. 2. Increasing mass effect with partial effacement of the left lateral ventricle and 5 mm of midline shift. 3. Stable intraventricular hemorrhage.  07-26-18: 2-d echo:  Left Ventricle: Left ventricular ejection fraction, by visual estimation, is 65 to 70%. The left ventricle has normal function. The left ventricle is not well visualized. There is moderately increased left ventricular hypertrophy. Left ventricular  diastolic parameters are consistent with Grade I diastolic dysfunction (impaired relaxation). Elevated left ventricular end-diastolic pressure.   Right Ventricle: The right ventricular size is normal. No increase in right ventricular wall thickness. Global RV systolic function is has normal systolic function. Left Atrium: Left atrial size was normal in size. Right Atrium: Right atrial size was normal in size Pericardium: There is no evidence of pericardial effusion. Mitral Valve: The mitral valve is normal in structure. No evidence of mitral valve regurgitation. No evidence of mitral valve stenosis by observation. MV peak gradient, 7.4 mmHg. Tricuspid Valve: The tricuspid valve is normal in structure. Tricuspid valve regurgitation is trivial.   Aortic Valve: The aortic valve was not well visualized. Aortic valve regurgitation is not visualized. Mild aortic valve sclerosis is present, with no evidence of aortic valve stenosis. Aortic valve mean gradient measures 6.0 mmHg. Aortic valve peak  gradient measures 11.0 mmHg. Aortic valve area, by VTI measures 2.05 cm.  07-28-19: ct of head: 1. Unchanged size and appearance of left thalamic intraparenchymal hematoma with intraventricular extension. 2. Unchanged minimal rightward midline shift.  07-28-19: swallow study: Dysphagia 1 (Puree) solids;Honey thick liquids   07-31-19:  MRI/MRA of head:  Parenchymal hemorrhage involving the left thalamus and adjacent white matter with intraventricular extension. Associated mass effect is similar to recent CT. No hydrocephalus. Moderate chronic microvascular ischemic changes. Mild irregularity and stenosis of left P1 PCA. Otherwise unremarkable MRA of the head.  NO NEW EXAMS.   LABS REVIEWED PREVIOUS  07-26-19: wbc 6.9; hgb 15.5; hct 45.8; mcv 91.4 plt 208; glucose 227; bun 15 creat 0.60; k+ 7.9; na++ 132; ca 8.9; liver normal albumin 3.2 hgb a1c 6.6 07-27-19: chol 205; ldl 120; trig 166; hdl 53 07-30-19: wbc 10.3; hgb 14.3; hct 43.5; mcv 91.8 plt 211; glucose 177; bun 21; creat 0.85; k+ 3.7; na++ 139; ca 8.4 08-01-19: wbc 9.6; hgb 14.5; ht 45.1; mcv 93.8 plt 213; glucose 131; bun 21; creat 0.67; k+ 3.7; na++ 148 ca 8.6 08-02-19: wbc 9.2; hgb 14.2; hct 44.6; mcv 95.3 plt 217; glucose 199; bun 21; creat  0.62; k+ 3.6; na++ 142; ca 8.7; mag 2.0 08-03-19: wbc 11.3; hgb 14.3; hct 44.5; mcv 93.9; plt 236; glucose 162; bun 19; creat 0.68; k+ 3.7; na++ 139; ca 8.4   NO NEW LABS.   Review of Systems  Unable to perform ROS: Language (speech is garbled )    Physical Exam Constitutional:      General: She is not in acute distress.    Appearance: She is well-developed. She is not diaphoretic.  Neck:     Thyroid: No thyromegaly.  Cardiovascular:     Rate and Rhythm: Normal rate and regular rhythm.     Pulses: Normal pulses.     Heart sounds: Normal heart sounds.  Pulmonary:     Effort: Pulmonary effort is normal. No respiratory distress.     Breath sounds: Normal breath sounds.  Abdominal:     General: Bowel sounds are normal. There is no distension.     Palpations: Abdomen is soft.     Tenderness: There is no abdominal tenderness.  Musculoskeletal:     Cervical back: Neck supple.     Right lower leg: No edema.     Left lower leg: No edema.     Comments: Does not move right side extremities    Lymphadenopathy:     Cervical: No  cervical adenopathy.  Skin:    General: Skin is warm and dry.  Neurological:     Mental Status: She is alert. Mental status is at baseline.  Psychiatric:        Mood and Affect: Mood normal.       ASSESSMENT/ PLAN:  TODAY;   1. IVH (intraventricular hemorrhage)  2. Cytotoxic brain edema 3. 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   MD is aware of resident's narcotic use and is in agreement with current plan of care. We will attempt to wean resident as appropriate.  Synthia Innocent NP [facility]  Contact [phone] Monday through Friday 8am- 5pm  After hours call [phone]

## 2019-08-10 ENCOUNTER — Other Ambulatory Visit: Payer: Self-pay | Admitting: *Deleted

## 2019-08-10 NOTE — Patient Outreach (Signed)
Screened for potential [facility] Care Management needs as a benefit of  [employer].  [name] [name] is currently receiving skilled therapy at [facility].   Writer attended telephonic interdisciplinary team meeting to assess for disposition needs and transition plan for resident.   Member is s/p CVA. Per facility, member is using a Public affairs consultant and is on a pureed diet. Speech therapy is following.   [name] [name] is from home alone. Has supportive grandson. Per facility, member's grandson has applied for Medicaid. Member will likely need LTC. Disposition plans are pending. Palliative follow up also suggested during meeting.   Will continue to follow for transition plans and potential [facility] Care Management needs.   Raiford Noble, MSN-Ed, RN,BSN [facility] Post Acute Care Coordinator [phone] [facility]) [phone]  (Toll free office)

## 2019-08-11 ENCOUNTER — Non-Acute Institutional Stay (SKILLED_NURSING_FACILITY): Payer: Medicare Other | Admitting: Internal Medicine

## 2019-08-11 ENCOUNTER — Encounter: Payer: Self-pay | Admitting: Internal Medicine

## 2019-08-11 ENCOUNTER — Encounter: Payer: Self-pay | Admitting: Adult Health

## 2019-08-11 DIAGNOSIS — E785 Hyperlipidemia, unspecified: Secondary | ICD-10-CM

## 2019-08-11 DIAGNOSIS — G936 Cerebral edema: Secondary | ICD-10-CM | POA: Diagnosis not present

## 2019-08-11 DIAGNOSIS — I152 Hypertension secondary to endocrine disorders: Secondary | ICD-10-CM

## 2019-08-11 DIAGNOSIS — F0630 Mood disorder due to known physiological condition, unspecified: Secondary | ICD-10-CM

## 2019-08-11 DIAGNOSIS — I69391 Dysphagia following cerebral infarction: Secondary | ICD-10-CM

## 2019-08-11 DIAGNOSIS — E1159 Type 2 diabetes mellitus with other circulatory complications: Secondary | ICD-10-CM

## 2019-08-11 DIAGNOSIS — I639 Cerebral infarction, unspecified: Secondary | ICD-10-CM | POA: Diagnosis not present

## 2019-08-11 DIAGNOSIS — R4701 Aphasia: Secondary | ICD-10-CM | POA: Diagnosis not present

## 2019-08-11 DIAGNOSIS — E1169 Type 2 diabetes mellitus with other specified complication: Secondary | ICD-10-CM | POA: Diagnosis not present

## 2019-08-11 DIAGNOSIS — E1165 Type 2 diabetes mellitus with hyperglycemia: Secondary | ICD-10-CM

## 2019-08-11 DIAGNOSIS — I69351 Hemiplegia and hemiparesis following cerebral infarction affecting right dominant side: Secondary | ICD-10-CM | POA: Diagnosis not present

## 2019-08-11 DIAGNOSIS — I10 Essential (primary) hypertension: Secondary | ICD-10-CM | POA: Diagnosis not present

## 2019-08-11 DIAGNOSIS — I615 Nontraumatic intracerebral hemorrhage, intraventricular: Secondary | ICD-10-CM | POA: Diagnosis not present

## 2019-08-11 NOTE — Progress Notes (Signed)
Opened in error; Disregard.

## 2019-08-11 NOTE — Progress Notes (Signed)
: Provider:  Hennie Duos., MD Location:  [place] Room Number: 150-P Place of Service:  SNF (31)  PCP: Celedonio Savage, MD Patient Care Team: Celedonio Savage, MD as PCP - General [facility] Medicine)  Extended Emergency Contact Information Primary Emergency Contact: Whitt,Gloria [place] [address] [address]itter of [place] Phone: [phone] Relation: Daughter Secondary Emergency Contact: Mariann Laster Mobile Phone: [phone] Relation: Grandson     Allergies: Contrast media [iodinated diagnostic agents]  Chief Complaint  Patient presents with  . New Admit To SNF    New admission to [facility]    HPI: Patient is an 86 y.o. female with hypertension, diabetes, coronary artery disease, who presented with right-sided weakness to [facility] that was discovered around 7 PM.  Family last saw her at 8, they are staying at a hotel and month but work about that.  At 730 they returned upon her incapacitated.  In the ED patient was awake alert but aphasic, only occasionally following commands with severe right hemiparesis.  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  Patient was hypertensive in the ICU that has resolved neurology recommends skilled nursing facility and pured honey thick for dysphagia.  Patient is admitted to skilled nursing facility for OT/PT.  While at skilled nursing facility patient will be followed for hypertension treated with Norvasc, lisinopril, hyperlipidemia treated with Lipitor and diabetes treated with Glucophage.  Past Medical History:  Diagnosis Date  . Arthritis   . Coronary artery disease    angina  . Diabetes mellitus without complication ([place])   . Hypertension     Past Surgical History:  Procedure Laterality Date  . APPENDECTOMY    . CHOLECYSTECTOMY      Allergies as of 08/11/2019      Reactions   Contrast Media [iodinated Diagnostic Agents]  Anaphylaxis      Medication List    Notice   This visit is during an admission. Changes to the med list made in this visit will be reflected in the After Visit Summary of the admission.    Current Outpatient Medications on File Prior to Visit  Medication Sig Dispense Refill  . amLODipine (NORVASC) 10 MG tablet Take 1 tablet (10 mg total) by mouth daily.    [name] atorvastatin (LIPITOR) 20 MG tablet Take 1 tablet (20 mg total) by mouth daily at 6 PM.    . lisinopril (ZESTRIL) 10 MG tablet Take 10 mg by mouth daily.    . metFORMIN (GLUCOPHAGE) 500 MG tablet Take 1 tablet by mouth 2 (two) times daily.  6  . NON FORMULARY Diet - PUREED DIET. Liquids: ___Regular; ___Thickened ___ Consistency: ___ Nectar, _X__Honey, ___ Pudding; ____ Fluid Restriction. Pureed diet Special Instructions: PUREED Consistency with HONEY THICK LIQUIDS Pureed diet with honey thick liquids    . Nutritional Supplements (NUTRITIONAL SUPPLEMENT PO) Take 1 each by mouth 3 (three) times daily. Magic Cup    . senna-docusate (SENOKOT-S) 8.6-50 MG tablet Take 1 tablet by mouth 2 (two) times daily.     No current facility-administered medications on file prior to visit.     No orders of the defined types were placed in this encounter.    There is no immunization history on file for this patient.  Social History   Tobacco Use  . Smoking status: Never Smoker  . Smokeless tobacco: Never Used  Substance Use Topics  . Alcohol use: No    Family history is unable to  obtain from patient and 70-year-old find it in care everywhere or other documentation  History reviewed. No pertinent family history.    Review of Systems   unable to obtain, speech is garbled; nursing-no acute concerns    Vitals:   08/11/19 0854  BP: (!) 144/78  Pulse: 82  Resp: 20  Temp: (!) 97.2 F (36.2 C)  SpO2: 96%    SpO2 Readings from Last 1 Encounters:  08/11/19 96%   Body mass index is 28.63 kg/m.     Physical Exam  GENERAL  APPEARANCE: Alert, conversant,  No acute distress.  SKIN: No diaphoresis rash HEAD: Normocephalic, atraumatic  EYES: Conjunctiva/lids clear. Pupils round, reactive. EOMs intact.  EARS: External exam WNL, canals clear. Hearing grossly normal.  NOSE: No deformity or discharge.  MOUTH/THROAT: Lips w/o lesions  RESPIRATORY: Breathing is even, unlabored. Lung sounds are clear   CARDIOVASCULAR: Heart RRR no murmurs, rubs or gallops. No peripheral edema.   GASTROINTESTINAL: Abdomen is soft, non-tender, not distended w/ normal bowel sounds. GENITOURINARY: Bladder non tender, not distended  MUSCULOSKELETAL: No abnormal joints or musculature NEUROLOGIC:  Cranial nerves 2-12 grossly intact, speech is at time intelligible,; right hemiparesis PSYCHIATRIC: Mood and affect appropriate to situation, no behavioral issues  Patient Active Problem List   Diagnosis Date Noted  . Dyslipidemia associated with type 2 diabetes mellitus (HCC) 08/04/2019  . Chronic constipation 08/04/2019  . Dysphagia due to recent stroke 08/04/2019  . Hemiplegia affecting right dominant side (HCC) 08/04/2019  . Hypertension associated with type 2 diabetes mellitus (HCC) 07/31/2019  . Hyperkalemia   . Controlled type 2 diabetes mellitus with hyperglycemia, without long-term current use of insulin (HCC)   . Hyponatremia   . 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 Brain herniation (HCC) 07/27/2019  . IVH (intraventricular hemorrhage) (HCC) 07/26/2019      Labs reviewed: Basic Metabolic Panel:    Component Value Date/Time   NA 139 08/03/2019 0243   NA 141 07/13/2013 0437   K 3.7 08/03/2019 0243   K 4.5 07/13/2013 0437   CL 105 08/03/2019 0243   CL 112 (H) 07/13/2013 0437   CO2 26 08/03/2019 0243   CO2 24 07/13/2013 0437   GLUCOSE 162 (H) 08/03/2019 0243   GLUCOSE 143 (H) 07/13/2013 0437   BUN 19 08/03/2019 0243   BUN 14 07/13/2013 0437   CREATININE 0.68 08/03/2019 0243   CREATININE 0.74 07/13/2013 0437    CALCIUM 8.4 (L) 08/03/2019 0243   CALCIUM 7.8 (L) 07/13/2013 0437   PROT 6.6 07/26/2019 2141   PROT 7.6 06/30/2013 1421   ALBUMIN 3.2 (L) 07/26/2019 2141   ALBUMIN 3.3 (L) 06/30/2013 1421   AST 41 07/26/2019 2141   AST 38 (H) 06/30/2013 1421   ALT 19 07/26/2019 2141   ALT 37 06/30/2013 1421   ALKPHOS 70 07/26/2019 2141   ALKPHOS 83 06/30/2013 1421   BILITOT 0.9 07/26/2019 2141   BILITOT 0.5 06/30/2013 1421   GFRNONAA >60 08/03/2019 0243   GFRNONAA >60 07/13/2013 0437   GFRAA >60 08/03/2019 0243   GFRAA >60 07/13/2013 0437    Recent Labs    08/01/19 0234 08/02/19 0143 08/03/19 0243  NA 148* 142 139  K 3.7 3.6 3.7  CL 110 108 105  CO2 29 27 26   GLUCOSE 131* 199* 162*  BUN 21 21 19   CREATININE 0.67 0.62 0.68  CALCIUM 8.6* 8.7* 8.4*  MG  --  2.0  --    Liver Function Tests: Recent Labs  07/26/19 2141  AST 41  ALT 19  ALKPHOS 70  BILITOT 0.9  PROT 6.6  ALBUMIN 3.2*   No results for input(s): LIPASE, AMYLASE in the last 8760 hours. No results for input(s): AMMONIA in the last 8760 hours. CBC: Recent Labs    07/26/19 2045 07/26/19 2051 08/01/19 0234 08/02/19 0143 08/03/19 0243  WBC 6.9   < > 9.6 9.2 11.3*  NEUTROABS 6.1  --   --   --   --   HGB 15.5*   < > 14.5 14.2 14.3  HCT 45.8   < > 45.1 44.6 44.5  MCV 91.4   < > 93.8 95.3 93.9  PLT 208   < > 213 217 236   < > = values in this interval not displayed.   Lipid Recent Labs    07/27/19 1114  CHOL 205*  HDL 53  LDLCALC 120*  TRIG 161*    Cardiac Enzymes: No results for input(s): CKTOTAL, CKMB, CKMBINDEX, TROPONINI in the last 8760 hours. BNP: No results for input(s): BNP in the last 8760 hours. No results found for: [facility] Lab Results  Component Value Date   HGBA1C 6.6 (H) 07/26/2019   Lab Results  Component Value Date   TSH 0.691 07/12/2013   No results found for: VITAMINB12 No results found for: FOLATE No results found for: IRON, TIBC, FERRITIN  Imaging and Procedures obtained  prior to SNF admission: No results found.   Not all labs, radiology exams or other studies done during hospitalization come through on my EPIC note; however they are reviewed by me.    Assessment and Plan  Intraventricular hemorrhage-left thalamic and posterior limb internal capsule hemorrhage stable with surrounding edema, with increasing mass-effect with partial effacement of the left lateral ventricle and 5 mm of midline shift with intraventricular hemorrhage with right hemiplegia, aphasia, and dysphagia SNF-admitted for OT/PT; continue dysphagia diet and a thick pured  Mood disorder SNF-probably secondary to bleed; continue Seroquel 12.5 mg twice daily  Hypertension SNF-stable now; continue Norvasc 10 mg daily and lisinopril 10 mg daily  Diabetes mellitus type 2-A1c 6.6 SNF-continue Metformin 500 mg twice daily; patient is on ACE and statin  Hyperlipidemia associated wi with diabetes mellitus; LDL 120 SNF-continue Lipitor 20 mg daily   Full PPE was required for this visit   Time spent greater than 45 minutes;> 50% of time with patient was spent reviewing records, labs, tests and studies, counseling and developing plan of care  Margit Hanks, MD

## 2019-08-12 ENCOUNTER — Encounter: Payer: Self-pay | Admitting: Adult Health

## 2019-08-12 NOTE — Progress Notes (Signed)
A user error has taken place: encounter opened in error, closed for administrative reasons.

## 2019-08-15 ENCOUNTER — Encounter: Payer: Self-pay | Admitting: Internal Medicine

## 2019-08-15 DIAGNOSIS — F0630 Mood disorder due to known physiological condition, unspecified: Secondary | ICD-10-CM | POA: Insufficient documentation

## 2019-08-15 DIAGNOSIS — I639 Cerebral infarction, unspecified: Secondary | ICD-10-CM | POA: Insufficient documentation

## 2019-08-15 DIAGNOSIS — R4701 Aphasia: Secondary | ICD-10-CM | POA: Insufficient documentation

## 2019-08-17 ENCOUNTER — Other Ambulatory Visit: Payer: Self-pay | Admitting: *Deleted

## 2019-08-17 NOTE — Patient Outreach (Signed)
Screened for potential [facility] Care Management needs as a benefit of  [employer].  Mrs. Husser is currently receiving skilled therapy at [facility] SNF.  Spoke with [facility] RN after she attended telephonic interdisciplinary team meeting with [facility] facility staff.  Reportedly, member is not participating with therapy. Transition plan is for long term care.    Raiford Noble, MSN-Ed, RN,BSN [facility] Post Acute Care Coordinator [phone] [facility]) [phone]  (Toll free office)

## 2019-08-23 ENCOUNTER — Other Ambulatory Visit: Payer: Self-pay | Admitting: Adult Health

## 2019-08-23 MED ORDER — LORAZEPAM 0.5 MG PO TABS: 0.5000 mg | ORAL_TABLET | Freq: Two times a day (BID) | ORAL | 0 refills | Status: DC | PRN

## 2019-08-26 ENCOUNTER — Non-Acute Institutional Stay (SKILLED_NURSING_FACILITY): Payer: Medicare Other | Admitting: Adult Health

## 2019-08-26 ENCOUNTER — Encounter: Payer: Self-pay | Admitting: Adult Health

## 2019-08-26 DIAGNOSIS — I615 Nontraumatic intracerebral hemorrhage, intraventricular: Secondary | ICD-10-CM | POA: Diagnosis not present

## 2019-08-26 DIAGNOSIS — I69351 Hemiplegia and hemiparesis following cerebral infarction affecting right dominant side: Secondary | ICD-10-CM | POA: Diagnosis not present

## 2019-08-26 DIAGNOSIS — E1169 Type 2 diabetes mellitus with other specified complication: Secondary | ICD-10-CM

## 2019-08-26 DIAGNOSIS — G936 Cerebral edema: Secondary | ICD-10-CM | POA: Diagnosis not present

## 2019-08-26 DIAGNOSIS — E785 Hyperlipidemia, unspecified: Secondary | ICD-10-CM

## 2019-08-26 NOTE — Progress Notes (Signed)
Location:    [facility] Room Number: 114W Place of Service:  SNF (31) Carleene Overlie NP    CODE STATUS: DNR  Allergies  Allergen Reactions  . Contrast Media [Iodinated Diagnostic Agents] Anaphylaxis    Chief Complaint  Patient presents with  . Medical Management of Chronic Issues       IVH (intraventriuclar hemorrhage)/ cytotoxic brain edema/ hemiplegia right side:. Dysphagia due to recent stroke:  Dyslipidemia associated with type 2 diabetes mellitus   Weekly follow up for the first 30 days post hospitalization     HPI:  She is a 86 year old short term rehab patient being seen for the management of her chronic illnesses; IVH: dysphagia; dyslipidemia. There are no reports of choking; no uncontrolled pain; no changes in appetite; no agitation.   Past Medical History:  Diagnosis Date  . Arthritis   . Coronary artery disease    angina  . Diabetes mellitus without complication (HCC)   . Hypertension     Past Surgical History:  Procedure Laterality Date  . APPENDECTOMY    . CHOLECYSTECTOMY      Social History   Socioeconomic History  . Marital status: Widowed    Spouse name: Not on file  . Number of children: Not on file  . Years of education: Not on file  . Highest education level: Not on file  Occupational History  . Not on file  Tobacco Use  . Smoking status: Never Smoker  . Smokeless tobacco: Never Used  Substance and Sexual Activity  . Alcohol use: No  . Drug use: No  . Sexual activity: Not on file  Other Topics Concern  . Not on file  Social History Narrative  . Not on file   Social Determinants of Health   Financial Resource Strain:   . Difficulty of Paying Living Expenses: Not on file  Food Insecurity:   . Worried About Programme researcher, broadcasting/film/video in the Last Year: Not on file  . Ran Out of Food in the Last Year: Not on file  Transportation Needs:   . Lack of Transportation (Medical): Not on file  . Lack of Transportation  (Non-Medical): Not on file  Physical Activity:   . Days of Exercise per Week: Not on file  . Minutes of Exercise per Session: Not on file  Stress:   . Feeling of Stress : Not on file  Social Connections:   . Frequency of Communication with Friends and Family: Not on file  . Frequency of Social Gatherings with Friends and Family: Not on file  . Attends Religious Services: Not on file  . Active Member of Clubs or Organizations: Not on file  . Attends Banker Meetings: Not on file  . Marital Status: Not on file  Intimate Partner Violence:   . Fear of Current or Ex-Partner: Not on file  . Emotionally Abused: Not on file  . Physically Abused: Not on file  . Sexually Abused: Not on file   History reviewed. No pertinent family history.    VITAL SIGNS BP 139/68   Pulse 61   Temp 98.4 F (36.9 C) (Oral)   Resp 18   Ht 5\' 4"  (1.626 m)   Wt 158 lb (71.7 kg)   BMI 27.12 kg/m   Outpatient Encounter Medications as of 08/26/2019  Medication Sig  . amLODipine (NORVASC) 10 MG tablet Take 1 tablet (10 mg total) by mouth daily.  10/24/2019 atorvastatin (LIPITOR) 20 MG tablet  Take 1 tablet (20 mg total) by mouth daily at 6 PM.  . Balsam [place]-Castor Oil (VENELEX) OINT Apply topically. Apply to sacrum and bilateral buttocks qshift for prevention.  [name] lisinopril (ZESTRIL) 10 MG tablet Take 10 mg by mouth daily.  [name] LORazepam (ATIVAN) 0.5 MG tablet Take 1 tablet (0.5 mg total) by mouth 2 (two) times daily as needed for up to 14 days for anxiety.  . metFORMIN (GLUCOPHAGE) 500 MG tablet Take 1 tablet by mouth 2 (two) times daily.  . NON FORMULARY Diet Change: Dysphagia 2 (ground), NECTAR liquids Pureed diet with honey thick liquids  . NON FORMULARY Accu-check qam. Notify provider of results under 60 or over 400. Once A Day  . Nutritional Supplements (NUTRITIONAL SUPPLEMENT PO) Take 1 each by mouth 3 (three) times daily between meals. Magic Cup  . senna-docusate (SENOKOT-S) 8.6-50 MG tablet Take  1 tablet by mouth 2 (two) times daily.  . sertraline (ZOLOFT) 50 MG tablet Take 50 mg by mouth daily.   No facility-administered encounter medications on file as of 08/26/2019.     SIGNIFICANT DIAGNOSTIC EXAMS  PREVIOUS   07-26-19: ct of head: 1. 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 2. Mild chronic small vessel ischemic disease.  07-27-19: ct of head:  1. Stable size of left thalamic and posterior limb internal capsule hemorrhage with increasing surrounding edema, as expected. 2. Increasing mass effect with partial effacement of the left lateral ventricle and 5 mm of midline shift. 3. Stable intraventricular hemorrhage.  07-26-18: 2-d echo:  Left Ventricle: Left ventricular ejection fraction, by visual estimation, is 65 to 70%. The left ventricle has normal function. The left ventricle is not well visualized. There is moderately increased left ventricular hypertrophy. Left ventricular  diastolic parameters are consistent with Grade I diastolic dysfunction (impaired relaxation). Elevated left ventricular end-diastolic pressure.   Right Ventricle: The right ventricular size is normal. No increase in right ventricular wall thickness. Global RV systolic function is has normal systolic function. Left Atrium: Left atrial size was normal in size. Right Atrium: Right atrial size was normal in size Pericardium: There is no evidence of pericardial effusion. Mitral Valve: The mitral valve is normal in structure. No evidence of mitral valve regurgitation. No evidence of mitral valve stenosis by observation. MV peak gradient, 7.4 mmHg. Tricuspid Valve: The tricuspid valve is normal in structure. Tricuspid valve regurgitation is trivial.   Aortic Valve: The aortic valve was not well visualized. Aortic valve regurgitation is not visualized. Mild aortic valve sclerosis is present, with no evidence of aortic valve stenosis. Aortic valve mean gradient measures 6.0 mmHg. Aortic valve peak    gradient measures 11.0 mmHg. Aortic valve area, by VTI measures 2.05 cm.  07-28-19: ct of head: 1. Unchanged size and appearance of left thalamic intraparenchymal hematoma with intraventricular extension. 2. Unchanged minimal rightward midline shift.  07-28-19: swallow study: Dysphagia 1 (Puree) solids;Honey thick liquids   07-31-19: MRI/MRA of head:  Parenchymal hemorrhage involving the left thalamus and adjacent white matter with intraventricular extension. Associated mass effect is similar to recent CT. No hydrocephalus. Moderate chronic microvascular ischemic changes. Mild irregularity and stenosis of left P1 PCA. Otherwise unremarkable MRA of the head.  NO NEW EXAMS.   LABS REVIEWED PREVIOUS  07-26-19: wbc 6.9; hgb 15.5; hct 45.8; mcv 91.4 plt 208; glucose 227; bun 15 creat 0.60; k+ 7.9; na++ 132; ca 8.9; liver normal albumin 3.2 hgb a1c 6.6 07-27-19: chol 205; ldl 120; trig 166; hdl 53 07-30-19: wbc 10.3;  hgb 14.3; hct 43.5; mcv 91.8 plt 211; glucose 177; bun 21; creat 0.85; k+ 3.7; na++ 139; ca 8.4 08-01-19: wbc 9.6; hgb 14.5; ht 45.1; mcv 93.8 plt 213; glucose 131; bun 21; creat 0.67; k+ 3.7; na++ 148 ca 8.6 08-02-19: wbc 9.2; hgb 14.2; hct 44.6; mcv 95.3 plt 217; glucose 199; bun 21; creat 0.62; k+ 3.6; na++ 142; ca 8.7; mag 2.0 08-03-19: wbc 11.3; hgb 14.3; hct 44.5; mcv 93.9; plt 236; glucose 162; bun 19; creat 0.68; k+ 3.7; na++ 139; ca 8.4   NO NEW LABS.   Review of Systems  Unable to perform ROS: Language (garbled )    Physical Exam Constitutional:      General: She is not in acute distress.    Appearance: She is well-developed. She is not diaphoretic.  Neck:     Thyroid: No thyromegaly.  Cardiovascular:     Rate and Rhythm: Normal rate and regular rhythm.     Pulses: Normal pulses.     Heart sounds: Normal heart sounds.  Pulmonary:     Effort: Pulmonary effort is normal. No respiratory distress.     Breath sounds: Normal breath sounds.  Abdominal:     General: Bowel  sounds are normal. There is no distension.     Palpations: Abdomen is soft.     Tenderness: There is no abdominal tenderness.  Musculoskeletal:     Cervical back: Neck supple.     Right lower leg: No edema.     Left lower leg: No edema.     Comments: Does not move right side extremities     Lymphadenopathy:     Cervical: No cervical adenopathy.  Skin:    General: Skin is warm and dry.  Neurological:     Mental Status: She is alert. Mental status is at baseline.  Psychiatric:        Mood and Affect: Mood normal.       ASSESSMENT/ PLAN:   TODAY  1. IVH (intraventriuclar hemorrhage)/ cytotoxic brain edema/ hemiplegia right side: is stable will continue to monitor her status.   2. Dysphagia due to recent stroke: no signs of aspiration will continue nectar thick liquids.   3. Dyslipidemia associated with type 2 diabetes mellitus is stable LDL 120 will continue lipitor 20 mg daily   PREVIOUS  4. Chronic constipation: is stable will continue senna s twice daily   5. Hypertension associated with type 2 diabetes mellitus: is stable b/p 139/68 will continue norvasc 10 mg daily lisinopril 10 mg daily   6. Controlled type 2 diabetes mellitus with hyperglycemia without long term current use of insulin: is stable hgb a1c 6.6 will continue metformin 500 mg twice daily is on ace and statin not a candidate for asa    MD is aware of resident's narcotic use and is in agreement with current plan of care. We will attempt to wean resident as appropriate.  Synthia Innocent NP [facility] Adult Medicine  Contact [phone] Monday through Friday 8am- 5pm  After hours call [phone]

## 2019-08-31 ENCOUNTER — Other Ambulatory Visit: Payer: Self-pay | Admitting: *Deleted

## 2019-08-31 NOTE — Patient Outreach (Signed)
Screened for potential [facility] Care Management needs as a benefit of  [employer].  [name] [name] is currently receiving skilled therapy at [facility].   Writer attended telephonic interdisciplinary team meeting to assess for disposition needs and transition plan for resident.   Facility continues to refuse therapy at times. Lucila Maine has appealed [facility] several times. Transition plans remains to be long term care.  Will continue to follow while receiving skilled care at [facility].  Raiford Noble, MSN-Ed, RN,BSN [facility] Post Acute Care Coordinator [phone] [facility]) [phone]  (Toll free office)

## 2019-09-02 ENCOUNTER — Non-Acute Institutional Stay (SKILLED_NURSING_FACILITY): Payer: Medicare Other | Admitting: Adult Health

## 2019-09-02 ENCOUNTER — Encounter: Payer: Self-pay | Admitting: Adult Health

## 2019-09-02 DIAGNOSIS — K5909 Other constipation: Secondary | ICD-10-CM

## 2019-09-02 DIAGNOSIS — E1165 Type 2 diabetes mellitus with hyperglycemia: Secondary | ICD-10-CM

## 2019-09-02 DIAGNOSIS — E1159 Type 2 diabetes mellitus with other circulatory complications: Secondary | ICD-10-CM | POA: Diagnosis not present

## 2019-09-02 DIAGNOSIS — I10 Essential (primary) hypertension: Secondary | ICD-10-CM

## 2019-09-02 NOTE — Progress Notes (Signed)
Location:    [facility] Room Number: 517O Place of Service:  SNF (31) Phillips Grout NP   CODE STATUS: DNR  Allergies  Allergen Reactions   Contrast Media [Iodinated Diagnostic Agents] Anaphylaxis    Chief Complaint  Patient presents with   Medical Management of Chronic Issues         Chronic constipation: Hypertension associated with type 2 diabetes mellitus: Controlled type 2 diabetes mellitus with hyperglycemia without long term current use of insulin:   Weekly follow up for the first 30 days post hospitalization.     HPI:  She is a 86 year old long term resident of this facility being seen for the management of her chronic illnesses: constipation; hypertension; diabetes. There are no reports of changes in appetite; no reports of constipation; no reports of uncontrolled pain.   Past Medical History:  Diagnosis Date   Arthritis    Coronary artery disease    angina   Diabetes mellitus without complication ([place])    Hypertension     Past Surgical History:  Procedure Laterality Date   APPENDECTOMY     CHOLECYSTECTOMY      Social History   Socioeconomic History   Marital status: Widowed    Spouse name: Not on file   Number of children: Not on file   Years of education: Not on file   Highest education level: Not on file  Occupational History   Not on file  Tobacco Use   Smoking status: Never Smoker   Smokeless tobacco: Never Used  Substance and Sexual Activity   Alcohol use: No   Drug use: No   Sexual activity: Not on file  Other Topics Concern   Not on file  Social History Narrative   Not on file   Social Determinants of Health   Financial Resource Strain:    Difficulty of Paying Living Expenses: Not on file  Food Insecurity:    Worried About [place] in the Last Year: Not on file   Ran Out of Food in the Last Year: Not on file  Transportation Needs:    Lack of Transportation (Medical): Not on  file   Lack of Transportation (Non-Medical): Not on file  Physical Activity:    Days of Exercise per Week: Not on file   Minutes of Exercise per Session: Not on file  Stress:    Feeling of Stress : Not on file  Social Connections:    Frequency of Communication with Friends and Family: Not on file   Frequency of Social Gatherings with Friends and Family: Not on file   Attends Religious Services: Not on file   Active Member of Clubs or Organizations: Not on file   Attends Archivist Meetings: Not on file   Marital Status: Not on file  Intimate Partner Violence:    Fear of Current or Ex-Partner: Not on file   Emotionally Abused: Not on file   Physically Abused: Not on file   Sexually Abused: Not on file   History reviewed. No pertinent family history.    VITAL SIGNS BP 122/71    Pulse 68    Temp 98.1 F (36.7 C) (Oral)    Resp 18    Ht 5\' 4"  (1.626 m)    Wt 158 lb (71.7 kg)    SpO2 96%    BMI 27.12 kg/m   Outpatient Encounter Medications as of 09/02/2019  Medication Sig   amLODipine (NORVASC) 10 MG  tablet Take 1 tablet (10 mg total) by mouth daily.   atorvastatin (LIPITOR) 20 MG tablet Take 1 tablet (20 mg total) by mouth daily at 6 PM.   Balsam [place]-Castor Oil (VENELEX) OINT Apply topically. Apply to sacrum and bilateral buttocks qshift for prevention.   lisinopril (ZESTRIL) 10 MG tablet Take 10 mg by mouth daily.   LORazepam (ATIVAN) 0.5 MG tablet Take 1 tablet (0.5 mg total) by mouth 2 (two) times daily as needed for up to 14 days for anxiety.   metFORMIN (GLUCOPHAGE) 500 MG tablet Take 1 tablet by mouth 2 (two) times daily.   NON FORMULARY Diet Change: Dysphagia 2 (ground), NECTAR liquids Pureed diet with honey thick liquids   NON FORMULARY Accu-check qam. Notify provider of results under 60 or over 400. Once A Day   Nutritional Supplements (NUTRITIONAL SUPPLEMENT PO) Take 1 each by mouth 3 (three) times daily between meals. Magic Cup    senna (SENOKOT) 8.6 MG TABS tablet Take 2 tablets by mouth daily.   sertraline (ZOLOFT) 50 MG tablet Take 50 mg by mouth daily.   [DISCONTINUED] senna-docusate (SENOKOT-S) 8.6-50 MG tablet Take 1 tablet by mouth 2 (two) times daily.   No facility-administered encounter medications on file as of 09/02/2019.     SIGNIFICANT DIAGNOSTIC EXAMS   PREVIOUS   07-26-19: ct of head: 1. 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 2. Mild chronic small vessel ischemic disease.  07-27-19: ct of head:  1. Stable size of left thalamic and posterior limb internal capsule hemorrhage with increasing surrounding edema, as expected. 2. Increasing mass effect with partial effacement of the left lateral ventricle and 5 mm of midline shift. 3. Stable intraventricular hemorrhage.  07-26-18: 2-d echo:  Left Ventricle: Left ventricular ejection fraction, by visual estimation, is 65 to 70%. The left ventricle has normal function. The left ventricle is not well visualized. There is moderately increased left ventricular hypertrophy. Left ventricular  diastolic parameters are consistent with Grade I diastolic dysfunction (impaired relaxation). Elevated left ventricular end-diastolic pressure.   Right Ventricle: The right ventricular size is normal. No increase in right ventricular wall thickness. Global RV systolic function is has normal systolic function. Left Atrium: Left atrial size was normal in size. Right Atrium: Right atrial size was normal in size Pericardium: There is no evidence of pericardial effusion. Mitral Valve: The mitral valve is normal in structure. No evidence of mitral valve regurgitation. No evidence of mitral valve stenosis by observation. MV peak gradient, 7.4 mmHg. Tricuspid Valve: The tricuspid valve is normal in structure. Tricuspid valve regurgitation is trivial.   Aortic Valve: The aortic valve was not well visualized. Aortic valve regurgitation is not visualized. Mild  aortic valve sclerosis is present, with no evidence of aortic valve stenosis. Aortic valve mean gradient measures 6.0 mmHg. Aortic valve peak  gradient measures 11.0 mmHg. Aortic valve area, by VTI measures 2.05 cm.  07-28-19: ct of head: 1. Unchanged size and appearance of left thalamic intraparenchymal hematoma with intraventricular extension. 2. Unchanged minimal rightward midline shift.  07-28-19: swallow study: Dysphagia 1 (Puree) solids;Honey thick liquids   07-31-19: MRI/MRA of head:  Parenchymal hemorrhage involving the left thalamus and adjacent white matter with intraventricular extension. Associated mass effect is similar to recent CT. No hydrocephalus. Moderate chronic microvascular ischemic changes. Mild irregularity and stenosis of left P1 PCA. Otherwise unremarkable MRA of the head.  NO NEW EXAMS.   LABS REVIEWED PREVIOUS  07-26-19: wbc 6.9; hgb 15.5; hct 45.8; mcv 91.4 plt  208; glucose 227; bun 15 creat 0.60; k+ 7.9; na++ 132; ca 8.9; liver normal albumin 3.2 hgb a1c 6.6 07-27-19: chol 205; ldl 120; trig 166; hdl 53 10-26-40: wbc 10.3; hgb 14.3; hct 43.5; mcv 91.8 plt 211; glucose 177; bun 21; creat 0.85; k+ 3.7; na++ 139; ca 8.4 08-01-19: wbc 9.6; hgb 14.5; ht 45.1; mcv 93.8 plt 213; glucose 131; bun 21; creat 0.67; k+ 3.7; na++ 148 ca 8.6 08-02-19: wbc 9.2; hgb 14.2; hct 44.6; mcv 95.3 plt 217; glucose 199; bun 21; creat 0.62; k+ 3.6; na++ 142; ca 8.7; mag 2.0 08-03-19: wbc 11.3; hgb 14.3; hct 44.5; mcv 93.9; plt 236; glucose 162; bun 19; creat 0.68; k+ 3.7; na++ 139; ca 8.4   NO NEW LABS.   Review of Systems  Reason unable to perform ROS: garbled    Physical Exam Constitutional:      General: She is not in acute distress.    Appearance: She is well-developed. She is not diaphoretic.  Neck:     Thyroid: No thyromegaly.  Cardiovascular:     Rate and Rhythm: Normal rate and regular rhythm.     Pulses: Normal pulses.     Heart sounds: Normal heart sounds.  Pulmonary:      Effort: Pulmonary effort is normal. No respiratory distress.     Breath sounds: Normal breath sounds.  Abdominal:     General: Bowel sounds are normal. There is no distension.     Palpations: Abdomen is soft.     Tenderness: There is no abdominal tenderness.  Musculoskeletal:     Cervical back: Neck supple.     Right lower leg: No edema.     Left lower leg: No edema.     Comments: Does not move right side extremities   Lymphadenopathy:     Cervical: No cervical adenopathy.  Skin:    General: Skin is warm and dry.  Neurological:     Mental Status: She is alert. Mental status is at baseline.  Psychiatric:        Mood and Affect: Mood normal.     ASSESSMENT/ PLAN:  TODAY  1. Chronic constipation: is stable will continue senna s twice daily   2. Hypertension associated with type 2 diabetes mellitus: is stable b/p 122/71 will continue norvasc 10 mg daily lisinopril 10 mg daily  3. Controlled type 2 diabetes mellitus with hyperglycemia without long term current use of insulin: is stable hgb a1c 6.6; will continue metformin 500 mg twice daily: is on ace statin not a candidate for asa   PREVIOUS  4. IVH (intraventriuclar hemorrhage)/ cytotoxic brain edema/ hemiplegia right side: is stable will continue to monitor her status.   5. Dysphagia due to recent stroke: no signs of aspiration will continue nectar thick liquids.   6. Dyslipidemia associated with type 2 diabetes mellitus is stable LDL 120 will continue lipitor 20 mg daily     MD is aware of resident's narcotic use and is in agreement with current plan of care. We will attempt to wean resident as appropriate.  Synthia Innocent NP [facility]  Contact [phone] Monday through Friday 8am- 5pm  After hours call [phone]

## 2019-09-03 ENCOUNTER — Encounter: Payer: Self-pay | Admitting: Adult Health

## 2019-09-03 ENCOUNTER — Non-Acute Institutional Stay (SKILLED_NURSING_FACILITY): Payer: Medicare Other | Admitting: Adult Health

## 2019-09-03 DIAGNOSIS — F331 Major depressive disorder, recurrent, moderate: Secondary | ICD-10-CM | POA: Diagnosis not present

## 2019-09-03 DIAGNOSIS — I69391 Dysphagia following cerebral infarction: Secondary | ICD-10-CM

## 2019-09-03 DIAGNOSIS — R634 Abnormal weight loss: Secondary | ICD-10-CM | POA: Diagnosis not present

## 2019-09-03 NOTE — Progress Notes (Signed)
Location:    [facility] Room Number: 114W Place of Service:  SNF (31) Carleene Overlie NP    CODE STATUS: DNR  Allergies  Allergen Reactions  . Contrast Media [Iodinated Diagnostic Agents] Anaphylaxis    Chief Complaint  Patient presents with  . Acute Visit    Weight Loss    HPI:  She is losing weight. From 166 pounds on 08-06-19 to her current weight of 158 pounds. She did state that she has an appetite; does not like the food. She is depressed. She is on nectar thick liquids; no signs of aspiration present.   Past Medical History:  Diagnosis Date  . Arthritis   . Coronary artery disease    angina  . Diabetes mellitus without complication ([facility])   . Hypertension     Past Surgical History:  Procedure Laterality Date  . APPENDECTOMY    . CHOLECYSTECTOMY      Social History   Socioeconomic History  . Marital status: Widowed    Spouse name: Not on file  . Number of children: Not on file  . Years of education: Not on file  . Highest education level: Not on file  Occupational History  . Not on file  Tobacco Use  . Smoking status: Never Smoker  . Smokeless tobacco: Never Used  Substance and Sexual Activity  . Alcohol use: No  . Drug use: No  . Sexual activity: Not on file  Other Topics Concern  . Not on file  Social History Narrative  . Not on file   Social Determinants of Health   Financial Resource Strain:   . Difficulty of Paying Living Expenses: Not on file  Food Insecurity:   . Worried About Programme researcher, broadcasting/film/video in the Last Year: Not on file  . Ran Out of Food in the Last Year: Not on file  Transportation Needs:   . Lack of Transportation (Medical): Not on file  . Lack of Transportation (Non-Medical): Not on file  Physical Activity:   . Days of Exercise per Week: Not on file  . Minutes of Exercise per Session: Not on file  Stress:   . Feeling of Stress : Not on file  Social Connections:   . Frequency of Communication with  Friends and Family: Not on file  . Frequency of Social Gatherings with Friends and Family: Not on file  . Attends Religious Services: Not on file  . Active Member of Clubs or Organizations: Not on file  . Attends Banker Meetings: Not on file  . Marital Status: Not on file  Intimate Partner Violence:   . Fear of Current or Ex-Partner: Not on file  . Emotionally Abused: Not on file  . Physically Abused: Not on file  . Sexually Abused: Not on file   History reviewed. No pertinent family history.    VITAL SIGNS BP 122/71   Pulse 63   Temp 98.6 F (37 C) (Oral)   Resp 18   Ht 5\' 4"  (1.626 m)   Wt 158 lb (71.7 kg)   SpO2 96%   BMI 27.12 kg/m   Outpatient Encounter Medications as of 09/03/2019  Medication Sig  . amLODipine (NORVASC) 10 MG tablet Take 1 tablet (10 mg total) by mouth daily.  11/01/2019 atorvastatin (LIPITOR) 20 MG tablet Take 1 tablet (20 mg total) by mouth daily at 6 PM.  . Balsam [place]-Castor Oil (VENELEX) OINT Apply topically. Apply to sacrum and bilateral buttocks qshift for  prevention.  [name] lisinopril (ZESTRIL) 10 MG tablet Take 10 mg by mouth daily.  [name] LORazepam (ATIVAN) 0.5 MG tablet Take 1 tablet (0.5 mg total) by mouth 2 (two) times daily as needed for up to 14 days for anxiety.  . metFORMIN (GLUCOPHAGE) 500 MG tablet Take 1 tablet by mouth 2 (two) times daily.  . NON FORMULARY Diet Change: Dysphagia 2 (ground), NECTAR liquids Pureed diet with honey thick liquids  . NON FORMULARY Accu-check qam. Notify provider of results under 60 or over 400. Once A Day  . Nutritional Supplements (NUTRITIONAL SUPPLEMENT PO) Take 1 each by mouth 3 (three) times daily between meals. Magic Cup  . senna (SENOKOT) 8.6 MG TABS tablet Take 2 tablets by mouth daily.  . sertraline (ZOLOFT) 50 MG tablet Take 50 mg by mouth daily.   No facility-administered encounter medications on file as of 09/03/2019.     SIGNIFICANT DIAGNOSTIC EXAMS   PREVIOUS   07-26-19: ct of head: 1.  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 2. Mild chronic small vessel ischemic disease.  07-27-19: ct of head:  1. Stable size of left thalamic and posterior limb internal capsule hemorrhage with increasing surrounding edema, as expected. 2. Increasing mass effect with partial effacement of the left lateral ventricle and 5 mm of midline shift. 3. Stable intraventricular hemorrhage.  07-26-18: 2-d echo:  Left Ventricle: Left ventricular ejection fraction, by visual estimation, is 65 to 70%. The left ventricle has normal function. The left ventricle is not well visualized. There is moderately increased left ventricular hypertrophy. Left ventricular  diastolic parameters are consistent with Grade I diastolic dysfunction (impaired relaxation). Elevated left ventricular end-diastolic pressure.   Right Ventricle: The right ventricular size is normal. No increase in right ventricular wall thickness. Global RV systolic function is has normal systolic function. Left Atrium: Left atrial size was normal in size. Right Atrium: Right atrial size was normal in size Pericardium: There is no evidence of pericardial effusion. Mitral Valve: The mitral valve is normal in structure. No evidence of mitral valve regurgitation. No evidence of mitral valve stenosis by observation. MV peak gradient, 7.4 mmHg. Tricuspid Valve: The tricuspid valve is normal in structure. Tricuspid valve regurgitation is trivial.   Aortic Valve: The aortic valve was not well visualized. Aortic valve regurgitation is not visualized. Mild aortic valve sclerosis is present, with no evidence of aortic valve stenosis. Aortic valve mean gradient measures 6.0 mmHg. Aortic valve peak  gradient measures 11.0 mmHg. Aortic valve area, by VTI measures 2.05 cm.  07-28-19: ct of head: 1. Unchanged size and appearance of left thalamic intraparenchymal hematoma with intraventricular extension. 2. Unchanged minimal rightward midline shift.   07-28-19: swallow study: Dysphagia 1 (Puree) solids;Honey thick liquids   07-31-19: MRI/MRA of head:  Parenchymal hemorrhage involving the left thalamus and adjacent white matter with intraventricular extension. Associated mass effect is similar to recent CT. No hydrocephalus. Moderate chronic microvascular ischemic changes. Mild irregularity and stenosis of left P1 PCA. Otherwise unremarkable MRA of the head.  NO NEW EXAMS.   LABS REVIEWED PREVIOUS  07-26-19: wbc 6.9; hgb 15.5; hct 45.8; mcv 91.4 plt 208; glucose 227; bun 15 creat 0.60; k+ 7.9; na++ 132; ca 8.9; liver normal albumin 3.2 hgb a1c 6.6 07-27-19: chol 205; ldl 120; trig 166; hdl 53 7-0-48: wbc 10.3; hgb 14.3; hct 43.5; mcv 91.8 plt 211; glucose 177; bun 21; creat 0.85; k+ 3.7; na++ 139; ca 8.4 08-01-19: wbc 9.6; hgb 14.5; ht 45.1; mcv 93.8 plt 213;  glucose 131; bun 21; creat 0.67; k+ 3.7; na++ 148 ca 8.6 08-02-19: wbc 9.2; hgb 14.2; hct 44.6; mcv 95.3 plt 217; glucose 199; bun 21; creat 0.62; k+ 3.6; na++ 142; ca 8.7; mag 2.0 08-03-19: wbc 11.3; hgb 14.3; hct 44.5; mcv 93.9; plt 236; glucose 162; bun 19; creat 0.68; k+ 3.7; na++ 139; ca 8.4   NO NEW LABS.   Review of Systems  Reason unable to perform ROS: garbles     Physical Exam Constitutional:      General: She is not in acute distress.    Appearance: She is well-developed. She is not diaphoretic.  Neck:     Thyroid: No thyromegaly.  Cardiovascular:     Rate and Rhythm: Normal rate and regular rhythm.     Pulses: Normal pulses.     Heart sounds: Normal heart sounds.  Pulmonary:     Effort: Pulmonary effort is normal. No respiratory distress.     Breath sounds: Normal breath sounds.  Abdominal:     General: Bowel sounds are normal. There is no distension.     Palpations: Abdomen is soft.     Tenderness: There is no abdominal tenderness.  Musculoskeletal:     Cervical back: Neck supple.     Right lower leg: No edema.     Left lower leg: No edema.     Comments: Does  not move right side extremities    Lymphadenopathy:     Cervical: No cervical adenopathy.  Skin:    General: Skin is warm and dry.  Neurological:     Mental Status: She is alert. Mental status is at baseline.  Psychiatric:        Mood and Affect: Mood normal.       ASSESSMENT/ PLAN:  TODAY  1. Dysphagia due to recent stroke 2. Weight loss 3. Major depression  Will increase zoloft to 75 mg daily  Will continue to monitor her status Her depression is contributing to her weight loss.    MD is aware of resident's narcotic use and is in agreement with current plan of care. We will attempt to wean resident as appropriate.  Ok Edwards NP [facility]  Contact [phone] Monday through Friday 8am- 5pm  After hours call [phone]

## 2019-09-06 ENCOUNTER — Encounter: Payer: Self-pay | Admitting: Adult Health

## 2019-09-06 ENCOUNTER — Other Ambulatory Visit: Payer: Self-pay | Admitting: Adult Health

## 2019-09-06 ENCOUNTER — Other Ambulatory Visit (HOSPITAL_COMMUNITY): Payer: Self-pay | Admitting: Specialist

## 2019-09-06 ENCOUNTER — Non-Acute Institutional Stay (SKILLED_NURSING_FACILITY): Payer: Medicare Other | Admitting: Adult Health

## 2019-09-06 DIAGNOSIS — I69351 Hemiplegia and hemiparesis following cerebral infarction affecting right dominant side: Secondary | ICD-10-CM | POA: Diagnosis not present

## 2019-09-06 DIAGNOSIS — R1312 Dysphagia, oropharyngeal phase: Secondary | ICD-10-CM

## 2019-09-06 DIAGNOSIS — I69391 Dysphagia following cerebral infarction: Secondary | ICD-10-CM | POA: Diagnosis not present

## 2019-09-06 DIAGNOSIS — F331 Major depressive disorder, recurrent, moderate: Secondary | ICD-10-CM

## 2019-09-06 DIAGNOSIS — I615 Nontraumatic intracerebral hemorrhage, intraventricular: Secondary | ICD-10-CM

## 2019-09-06 DIAGNOSIS — R1319 Other dysphagia: Secondary | ICD-10-CM

## 2019-09-06 DIAGNOSIS — R1013 Epigastric pain: Secondary | ICD-10-CM

## 2019-09-06 MED ORDER — LORAZEPAM 0.5 MG PO TABS: 0.5000 mg | ORAL_TABLET | Freq: Two times a day (BID) | ORAL | 0 refills | Status: DC | PRN

## 2019-09-06 NOTE — Progress Notes (Addendum)
Location:    [facility] [facility] Room Number: 114/W Place of Service:  SNF (31)   CODE STATUS: DNR  Allergies  Allergen Reactions  . Contrast Media [Iodinated Diagnostic Agents] Anaphylaxis    Chief Complaint  Patient presents with  . Medical Management of Chronic Issues       Moderate episode of recurrent major depression:  IVH (intraventriculate hemorrhage) hemiplegia right side: Dysphagia due to recent stroke:    HPI:  She is a 86 year old long term resident of this facility being seen for the management of his chronic illnesses: depression; ivh; hemiplegia; dysphagia. No reports of aspiration. No reports of changes in appetite; no reports of agitation or anxiety.   Past Medical History:  Diagnosis Date  . Arthritis   . Coronary artery disease    angina  . Diabetes mellitus without complication ([place])   . Hypertension     Past Surgical History:  Procedure Laterality Date  . APPENDECTOMY    . CHOLECYSTECTOMY      Social History   Socioeconomic History  . Marital status: Widowed    Spouse name: Not on file  . Number of children: Not on file  . Years of education: Not on file  . Highest education level: Not on file  Occupational History  . Not on file  Tobacco Use  . Smoking status: Never Smoker  . Smokeless tobacco: Never Used  Substance and Sexual Activity  . Alcohol use: No  . Drug use: No  . Sexual activity: Not on file  Other Topics Concern  . Not on file  Social History Narrative  . Not on file   Social Determinants of Health   Financial Resource Strain:   . Difficulty of Paying Living Expenses: Not on file  Food Insecurity:   . Worried About Charity fundraiser in the Last Year: Not on file  . Ran Out of Food in the Last Year: Not on file  Transportation Needs:   . Lack of Transportation (Medical): Not on file  . Lack of Transportation (Non-Medical): Not on file  Physical Activity:   . Days of Exercise per Week: Not on file    . Minutes of Exercise per Session: Not on file  Stress:   . Feeling of Stress : Not on file  Social Connections:   . Frequency of Communication with Friends and Family: Not on file  . Frequency of Social Gatherings with Friends and Family: Not on file  . Attends Religious Services: Not on file  . Active Member of Clubs or Organizations: Not on file  . Attends Archivist Meetings: Not on file  . Marital Status: Not on file  Intimate Partner Violence:   . Fear of Current or Ex-Partner: Not on file  . Emotionally Abused: Not on file  . Physically Abused: Not on file  . Sexually Abused: Not on file   No family history on file.    VITAL SIGNS BP (!) 143/68   Pulse 68   Temp 99 F (37.2 C) (Oral)   Resp 18   Ht 5\' 4"  (1.626 m)   Wt 158 lb (71.7 kg)   SpO2 96%   BMI 27.12 kg/m   Outpatient Encounter Medications as of 09/06/2019  Medication Sig  . amLODipine (NORVASC) 10 MG tablet Take 1 tablet (10 mg total) by mouth daily.  [name] atorvastatin (LIPITOR) 20 MG tablet Take 1 tablet (20 mg total) by mouth daily at 6 PM.  .  Balsam [place]-Castor Oil (VENELEX) OINT Apply topically. Apply to sacrum and bilateral buttocks qshift for prevention.  [name] lisinopril (ZESTRIL) 10 MG tablet Take 10 mg by mouth daily.  [name] LORazepam (ATIVAN) 0.5 MG tablet Take 1 tablet (0.5 mg total) by mouth 2 (two) times daily as needed for up to 14 days for anxiety.  . metFORMIN (GLUCOPHAGE) 500 MG tablet Take 1 tablet by mouth 2 (two) times daily.  . NON FORMULARY Diet Change: Dysphagia 2 (ground), NECTAR liquids Pureed diet with honey thick liquids  . NON FORMULARY Accu-check qam. Notify provider of results under 60 or over 400. Once A Day  . Nutritional Supplements (NUTRITIONAL SUPPLEMENT PO) Take 1 each by mouth 3 (three) times daily between meals. Magic Cup  . senna (SENOKOT) 8.6 MG TABS tablet Take 2 tablets by mouth daily.  . sertraline (ZOLOFT) 50 MG tablet Take 75 mg by mouth daily.    No  facility-administered encounter medications on file as of 09/06/2019.     SIGNIFICANT DIAGNOSTIC EXAMS   PREVIOUS   07-26-19: ct of head: 1. 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 2. Mild chronic small vessel ischemic disease.  07-27-19: ct of head:  1. Stable size of left thalamic and posterior limb internal capsule hemorrhage with increasing surrounding edema, as expected. 2. Increasing mass effect with partial effacement of the left lateral ventricle and 5 mm of midline shift. 3. Stable intraventricular hemorrhage.  07-26-18: 2-d echo:  Left Ventricle: Left ventricular ejection fraction, by visual estimation, is 65 to 70%. The left ventricle has normal function. The left ventricle is not well visualized. There is moderately increased left ventricular hypertrophy. Left ventricular  diastolic parameters are consistent with Grade I diastolic dysfunction (impaired relaxation). Elevated left ventricular end-diastolic pressure.   Right Ventricle: The right ventricular size is normal. No increase in right ventricular wall thickness. Global RV systolic function is has normal systolic function. Left Atrium: Left atrial size was normal in size. Right Atrium: Right atrial size was normal in size Pericardium: There is no evidence of pericardial effusion. Mitral Valve: The mitral valve is normal in structure. No evidence of mitral valve regurgitation. No evidence of mitral valve stenosis by observation. MV peak gradient, 7.4 mmHg. Tricuspid Valve: The tricuspid valve is normal in structure. Tricuspid valve regurgitation is trivial.   Aortic Valve: The aortic valve was not well visualized. Aortic valve regurgitation is not visualized. Mild aortic valve sclerosis is present, with no evidence of aortic valve stenosis. Aortic valve mean gradient measures 6.0 mmHg. Aortic valve peak  gradient measures 11.0 mmHg. Aortic valve area, by VTI measures 2.05 cm.  07-28-19: ct of head: 1.  Unchanged size and appearance of left thalamic intraparenchymal hematoma with intraventricular extension. 2. Unchanged minimal rightward midline shift.  07-28-19: swallow study: Dysphagia 1 (Puree) solids;Honey thick liquids   07-31-19: MRI/MRA of head:  Parenchymal hemorrhage involving the left thalamus and adjacent white matter with intraventricular extension. Associated mass effect is similar to recent CT. No hydrocephalus. Moderate chronic microvascular ischemic changes. Mild irregularity and stenosis of left P1 PCA. Otherwise unremarkable MRA of the head.  NO NEW EXAMS.   LABS REVIEWED PREVIOUS  07-26-19: wbc 6.9; hgb 15.5; hct 45.8; mcv 91.4 plt 208; glucose 227; bun 15 creat 0.60; k+ 7.9; na++ 132; ca 8.9; liver normal albumin 3.2 hgb a1c 6.6 07-27-19: chol 205; ldl 120; trig 166; hdl 53 07-30-27: wbc 10.3; hgb 14.3; hct 43.5; mcv 91.8 plt 211; glucose 177; bun 21; creat 0.85; k+  3.7; na++ 139; ca 8.4 08-01-19: wbc 9.6; hgb 14.5; ht 45.1; mcv 93.8 plt 213; glucose 131; bun 21; creat 0.67; k+ 3.7; na++ 148 ca 8.6 08-02-19: wbc 9.2; hgb 14.2; hct 44.6; mcv 95.3 plt 217; glucose 199; bun 21; creat 0.62; k+ 3.6; na++ 142; ca 8.7; mag 2.0 08-03-19: wbc 11.3; hgb 14.3; hct 44.5; mcv 93.9; plt 236; glucose 162; bun 19; creat 0.68; k+ 3.7; na++ 139; ca 8.4   NO NEW LABS.       ASSESSMENT/ PLAN:  TODAY  1. Moderate episode of recurrent major depression: is without change recently had zoloft increased to 75 mg daily will continue ativan 0.5 mg twice daily as needed through 09-20-19  2. IVH (intraventriculate hemorrhage) hemiplegia right side: is stable will monitor her status.   3. Dysphagia due to recent stroke: no signs of aspiration present will continue nectar thick liquids.   PREVIOUS  4. Dyslipidemia associated with type 2 diabetes mellitus is stable LDL 120 will continue lipitor 20 mg daily   5. Chronic constipation: is stable will continue senna s twice daily   6. Hypertension  associated with type 2 diabetes mellitus: is stable b/p 122/71 will continue norvasc 10 mg daily lisinopril 10 mg daily  7. Controlled type 2 diabetes mellitus with hyperglycemia without long term current use of insulin: is stable hgb a1c 6.6; will continue metformin 500 mg twice daily: is on ace statin not a candidate for asa      MD is aware of resident's narcotic use and is in agreement with current plan of care. We will attempt to wean resident as appropriate.  Synthia Innocent NP [facility]  Contact [phone] Monday through Friday 8am- 5pm  After hours call [phone]

## 2019-09-07 DIAGNOSIS — R634 Abnormal weight loss: Secondary | ICD-10-CM | POA: Insufficient documentation

## 2019-09-07 DIAGNOSIS — F339 Major depressive disorder, recurrent, unspecified: Secondary | ICD-10-CM | POA: Insufficient documentation

## 2019-09-08 ENCOUNTER — Encounter (HOSPITAL_COMMUNITY): Payer: Self-pay | Admitting: Speech Pathology

## 2019-09-08 ENCOUNTER — Inpatient Hospital Stay (HOSPITAL_COMMUNITY)
Admit: 2019-09-08 | Discharge: 2019-09-08 | Disposition: A | Discharge status: Home / Self Care | Payer: Medicare Other | Attending: Internal Medicine | Admitting: Internal Medicine

## 2019-09-08 ENCOUNTER — Ambulatory Visit (HOSPITAL_COMMUNITY): Payer: No Typology Code available for payment source | Attending: Internal Medicine | Admitting: Speech Pathology

## 2019-09-08 DIAGNOSIS — R1312 Dysphagia, oropharyngeal phase: Secondary | ICD-10-CM

## 2019-09-08 DIAGNOSIS — R1319 Other dysphagia: Secondary | ICD-10-CM

## 2019-09-08 DIAGNOSIS — R1013 Epigastric pain: Secondary | ICD-10-CM

## 2019-09-08 DIAGNOSIS — I69391 Dysphagia following cerebral infarction: Secondary | ICD-10-CM

## 2019-09-08 NOTE — Therapy (Signed)
[facility] [address] [place], [place], [address] Phone: [phone]   Fax:  [phone]  Modified Barium Swallow  Patient Details  Name: [name] [name] MRN: [id number] Date of Birth: 07-27-1932 No data recorded  Encounter Date: 09/08/2019  End of Session - 09/08/19 1318    Visit Number  1    Number of Visits  1    Authorization Type  Medicare    SLP Start Time  1130    SLP Stop Time   1201    SLP Time Calculation (min)  31 min    Activity Tolerance  Patient tolerated treatment well       Past Medical History:  Diagnosis Date  . Arthritis   . Coronary artery disease    angina  . Diabetes mellitus without complication (HCC)   . Hypertension     Past Surgical History:  Procedure Laterality Date  . APPENDECTOMY    . CHOLECYSTECTOMY      There were no vitals filed for this visit.  Subjective Assessment - 09/08/19 1253    Subjective  Pt on mechanical soft and NTL    Special Tests  MBSS    Currently in Pain?  No/denies          General - 09/08/19 1258      General Information   Date of Onset  07/26/19    HPI  Pt is an 86 yo female presenting with R weakness, found to have large L thalamic hemorrhage on July 26, 2019. PMH: DM, HTN, CAD. She has been receiving therapy at [facility] since she was discharged from [facility]. Her last MBSS was completed at [facility] on Jan 6 with recommendation for puree and HTL.    Type of Study  MBS-Modified Barium Swallow Study    Diet Prior to this Study  Dysphagia 3 (soft);Thin liquids    Temperature Spikes Noted  No    Respiratory Status  Room air    History of Recent Intubation  No    Behavior/Cognition  Alert;Cooperative    Oral Cavity Assessment  Within Functional Limits    Oral Care Completed by SLP  No    Oral Cavity - Dentition  Edentulous    Vision  Functional for self feeding    Self-Feeding Abilities  Needs assist    Patient Positioning  Upright in chair     Baseline Vocal Quality  Normal    Volitional Cough  Strong    Volitional Swallow  Able to elicit    Anatomy  Within functional limits    Pharyngeal Secretions  Not observed secondary MBS         Oral Preparation/Oral Phase - 09/08/19 1307      Oral Preparation/Oral Phase   Oral Phase  Impaired      Oral - Thin   Oral - Thin Teaspoon  Weak ligual manipulation    Oral - Thin Cup  Weak ligual manipulation;Decreased bolus cohesion;Oral residue    Oral - Thin Straw  Right anterior bolus loss;Oral residue;Weak ligual manipulation      Oral - Solids   Oral - Puree  Weak ligual manipulation;Decreased bolus cohesion;Delayed A-P transit;Piecemeal swallowing    Oral - Regular  Weak ligual manipulation;Delayed A-P transit;Decreased bolus cohesion;Oral residue;Piecemeal swallowing;Imparied mastication    Oral - Pill  Delayed A-P transit      Electrical stimulation - Oral Phase   Was Electrical Stimulation Used  No  Pharyngeal Phase - 09/08/19 1314      Pharyngeal Phase   Pharyngeal Phase  Impaired      Pharyngeal - Thin   Pharyngeal- Thin Teaspoon  Swallow initiation at vallecula;Penetration/Aspiration during swallow    Pharyngeal  Material does not enter airway;Material enters airway, remains ABOVE vocal cords then ejected out    Pharyngeal- Thin Cup  Swallow initiation at vallecula;Penetration/Aspiration during swallow    Pharyngeal  Material does not enter airway;Material enters airway, remains ABOVE vocal cords then ejected out    Pharyngeal- Thin Straw  Swallow initiation at vallecula      Pharyngeal - Solids   Pharyngeal- Puree  Within functional limits    Pharyngeal- Regular  Within functional limits    Pharyngeal- Pill  Within functional limits      Electrical Stimulation - Pharyngeal Phase   Was Electrical Stimulation Used  No       Cricopharyngeal Phase - 09/08/19 1316      Cervical Esophageal Phase   Cervical Esophageal Phase  Within functional limits          Plan - 09/08/19 1319    Clinical Impression Statement Pt presents with mild/moderate oral phase dysphagia characterized by reduced labial closure on the right side, impaired lingual movement, piecemeal deglutition, and impaired mastication resulting in labial spillage, prolonged oral transit, piecemeal deglutition, oral residuals, and premature spillage Pharyngreal phase is [facility] with swallow trigger at the level of the valleculae and intermittent trace penetration without aspiration of thins. Pt with good hyolaryngeal excursion and pharyngeal clearance. Recommend D3/mech soft and thin liquids with supervision and trials of regular textures with SLP. Encourage Pt to swallow 2x for each bite/sip. Continue treatment to focus on oral phase deficits. Above to treating SLP and facility.    Consulted and Agree with Plan of Care  Patient       Patient will benefit from skilled therapeutic intervention in order to improve the following deficits and impairments:   Oropharyngeal dysphagia    Recommendations/Treatment - 09/08/19 1316      Swallow Evaluation Recommendations   SLP Diet Recommendations  Thin;Dysphagia 3 (mechanical soft)    Liquid Administration via  Cup    Medication Administration  Whole meds with puree    Supervision  Patient able to self feed    Compensations  Small sips/bites;Lingual sweep for clearance of pocketing;Monitor for anterior loss;Multiple dry swallows after each bite/sip    Postural Changes  Seated upright at 90 degrees;Remain upright for at least 30 minutes after feeds/meals       Prognosis - 09/08/19 1318      Prognosis   Prognosis for Safe Diet Advancement  Good    Barriers to Reach Goals  Language deficits      Individuals Consulted   Consulted and Agree with Results and Recommendations  Patient    Report Sent to   Primary SLP;Facility (Comment)       Problem List Patient Active Problem List   Diagnosis Date Noted  . Weight loss 09/07/2019  .  Major depression, recurrent (HCC) 09/07/2019  . Aphasia due to acute stroke (HCC) 08/15/2019  . 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 Dyslipidemia associated with type 2 diabetes mellitus (HCC) 08/04/2019  . Chronic constipation 08/04/2019  . Dysphagia due to recent stroke 08/04/2019  . Hemiplegia affecting right dominant side (HCC) 08/04/2019  . Hypertension associated with type 2 diabetes mellitus (HCC) 07/31/2019  . Hyperkalemia   . Controlled type 2 diabetes  mellitus with hyperglycemia, without long-term current use of insulin ([place])   . Hyponatremia   . 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 Brain herniation ([place]) 07/27/2019  . IVH (intraventricular hemorrhage) ([place]) 07/26/2019   Thank you,  Genene Churn, [place]  Genene Churn 09/08/2019, 1:26 PM  [facility] [address] [place], [place], [address] Phone: [phone]   Fax:  [phone]  Name: [name] [name] MRN: [id number] Date of Birth: 1932/12/20

## 2019-09-09 ENCOUNTER — Ambulatory Visit (HOSPITAL_COMMUNITY): Payer: Medicare Other | Admitting: Speech Pathology

## 2019-09-09 ENCOUNTER — Encounter (HOSPITAL_COMMUNITY): Payer: Self-pay

## 2019-09-09 ENCOUNTER — Other Ambulatory Visit (HOSPITAL_COMMUNITY): Payer: Medicare Other

## 2019-09-13 ENCOUNTER — Other Ambulatory Visit: Payer: Self-pay | Admitting: *Deleted

## 2019-09-13 NOTE — Patient Outreach (Signed)
[facility] Post Acute Care Coordinator follow up  Confirmed member transitioned to long term care at [facility].  No identifiable [facility] Care Management needs at this time.   Raiford Noble, MSN-Ed, RN,BSN [facility] Post Acute Care Coordinator [phone] [facility]) [phone]  (Toll free office)

## 2019-09-15 ENCOUNTER — Inpatient Hospital Stay: Payer: Medicare Other | Admitting: Adult Health

## 2019-09-20 ENCOUNTER — Encounter: Payer: Self-pay | Admitting: Adult Health

## 2019-09-20 ENCOUNTER — Other Ambulatory Visit: Payer: Self-pay | Admitting: Adult Health

## 2019-09-20 ENCOUNTER — Non-Acute Institutional Stay (SKILLED_NURSING_FACILITY): Payer: Medicare Other | Admitting: Adult Health

## 2019-09-20 DIAGNOSIS — I639 Cerebral infarction, unspecified: Secondary | ICD-10-CM | POA: Diagnosis not present

## 2019-09-20 DIAGNOSIS — F0630 Mood disorder due to known physiological condition, unspecified: Secondary | ICD-10-CM

## 2019-09-20 MED ORDER — LORAZEPAM 0.5 MG PO TABS: 0.5000 mg | ORAL_TABLET | Freq: Every day | ORAL | 0 refills | Status: DC

## 2019-09-20 NOTE — Progress Notes (Signed)
Location:    [facility] Room Number: 114/W Place of Service:  SNF (31)   CODE STATUS: DNR  Allergies  Allergen Reactions  . Contrast Media [Iodinated Diagnostic Agents] Anaphylaxis    Chief Complaint  Patient presents with  . Acute Visit    Anxiety    HPI:  She has ativan 0.5 mg twice daily as needed. She has been taking this medication on a routine basis at night. There are no reports of uncontrolled anxiety. No reports of agitation no reports of insomnia.   Past Medical History:  Diagnosis Date  . Arthritis   . Coronary artery disease    angina  . Diabetes mellitus without complication (HCC)   . Hypertension     Past Surgical History:  Procedure Laterality Date  . APPENDECTOMY    . CHOLECYSTECTOMY      Social History   Socioeconomic History  . Marital status: Widowed    Spouse name: Not on file  . Number of children: Not on file  . Years of education: Not on file  . Highest education level: Not on file  Occupational History  . Not on file  Tobacco Use  . Smoking status: Never Smoker  . Smokeless tobacco: Never Used  Substance and Sexual Activity  . Alcohol use: No  . Drug use: No  . Sexual activity: Not on file  Other Topics Concern  . Not on file  Social History Narrative  . Not on file   Social Determinants of Health   Financial Resource Strain:   . Difficulty of Paying Living Expenses: Not on file  Food Insecurity:   . Worried About Programme researcher, broadcasting/film/video in the Last Year: Not on file  . Ran Out of Food in the Last Year: Not on file  Transportation Needs:   . Lack of Transportation (Medical): Not on file  . Lack of Transportation (Non-Medical): Not on file  Physical Activity:   . Days of Exercise per Week: Not on file  . Minutes of Exercise per Session: Not on file  Stress:   . Feeling of Stress : Not on file  Social Connections:   . Frequency of Communication with Friends and Family: Not on file  . Frequency of  Social Gatherings with Friends and Family: Not on file  . Attends Religious Services: Not on file  . Active Member of Clubs or Organizations: Not on file  . Attends Banker Meetings: Not on file  . Marital Status: Not on file  Intimate Partner Violence:   . Fear of Current or Ex-Partner: Not on file  . Emotionally Abused: Not on file  . Physically Abused: Not on file  . Sexually Abused: Not on file   No family history on file.    VITAL SIGNS BP (!) 142/76   Pulse 68   Temp 98.8 F (37.1 C) (Oral)   Resp 20   Ht 5\' 4"  (1.626 m)   Wt 158 lb (71.7 kg)   SpO2 96%   BMI 27.12 kg/m   Outpatient Encounter Medications as of 09/20/2019  Medication Sig  . amLODipine (NORVASC) 10 MG tablet Take 1 tablet (10 mg total) by mouth daily.  11/20/2019 atorvastatin (LIPITOR) 20 MG tablet Take 1 tablet (20 mg total) by mouth daily at 6 PM.  . Balsam [place]-Castor Oil (VENELEX) OINT Apply topically. Apply to sacrum and bilateral buttocks qshift for prevention.  [name] lisinopril (ZESTRIL) 10 MG tablet Take 10 mg by mouth  daily.  [name] LORazepam (ATIVAN) 0.5 MG tablet Take 1 tablet (0.5 mg total) by mouth at bedtime.  . metFORMIN (GLUCOPHAGE) 500 MG tablet Take 1 tablet by mouth 2 (two) times daily.  . NON FORMULARY Diet Change: Regular, carbohydrate consistent with thin liquids  . NON FORMULARY Accu-check qam. Notify provider of results under 60 or over 400. Once A Day  . sertraline (ZOLOFT) 50 MG tablet Take 75 mg by mouth daily.   . [DISCONTINUED] Nutritional Supplements (NUTRITIONAL SUPPLEMENT PO) Take 1 each by mouth 3 (three) times daily between meals. Magic Cup  . [DISCONTINUED] senna (SENOKOT) 8.6 MG TABS tablet Take 2 tablets by mouth daily.   No facility-administered encounter medications on file as of 09/20/2019.     SIGNIFICANT DIAGNOSTIC EXAMS    PREVIOUS   07-26-19: ct of head: 1. 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 2. Mild chronic small vessel ischemic  disease.  07-27-19: ct of head:  1. Stable size of left thalamic and posterior limb internal capsule hemorrhage with increasing surrounding edema, as expected. 2. Increasing mass effect with partial effacement of the left lateral ventricle and 5 mm of midline shift. 3. Stable intraventricular hemorrhage.  07-26-18: 2-d echo:  Left Ventricle: Left ventricular ejection fraction, by visual estimation, is 65 to 70%. The left ventricle has normal function. The left ventricle is not well visualized. There is moderately increased left ventricular hypertrophy. Left ventricular  diastolic parameters are consistent with Grade I diastolic dysfunction (impaired relaxation). Elevated left ventricular end-diastolic pressure.   Right Ventricle: The right ventricular size is normal. No increase in right ventricular wall thickness. Global RV systolic function is has normal systolic function. Left Atrium: Left atrial size was normal in size. Right Atrium: Right atrial size was normal in size Pericardium: There is no evidence of pericardial effusion. Mitral Valve: The mitral valve is normal in structure. No evidence of mitral valve regurgitation. No evidence of mitral valve stenosis by observation. MV peak gradient, 7.4 mmHg. Tricuspid Valve: The tricuspid valve is normal in structure. Tricuspid valve regurgitation is trivial.   Aortic Valve: The aortic valve was not well visualized. Aortic valve regurgitation is not visualized. Mild aortic valve sclerosis is present, with no evidence of aortic valve stenosis. Aortic valve mean gradient measures 6.0 mmHg. Aortic valve peak  gradient measures 11.0 mmHg. Aortic valve area, by VTI measures 2.05 cm.  07-28-19: ct of head: 1. Unchanged size and appearance of left thalamic intraparenchymal hematoma with intraventricular extension. 2. Unchanged minimal rightward midline shift.  07-28-19: swallow study: Dysphagia 1 (Puree) solids;Honey thick liquids   07-31-19: MRI/MRA of head:   Parenchymal hemorrhage involving the left thalamus and adjacent white matter with intraventricular extension. Associated mass effect is similar to recent CT. No hydrocephalus. Moderate chronic microvascular ischemic changes. Mild irregularity and stenosis of left P1 PCA. Otherwise unremarkable MRA of the head.  NO NEW EXAMS.   LABS REVIEWED PREVIOUS  07-26-19: wbc 6.9; hgb 15.5; hct 45.8; mcv 91.4 plt 208; glucose 227; bun 15 creat 0.60; k+ 7.9; na++ 132; ca 8.9; liver normal albumin 3.2 hgb a1c 6.6 07-27-19: chol 205; ldl 120; trig 166; hdl 53 07-30-19: wbc 10.3; hgb 14.3; hct 43.5; mcv 91.8 plt 211; glucose 177; bun 21; creat 0.85; k+ 3.7; na++ 139; ca 8.4 08-01-19: wbc 9.6; hgb 14.5; ht 45.1; mcv 93.8 plt 213; glucose 131; bun 21; creat 0.67; k+ 3.7; na++ 148 ca 8.6 08-02-19: wbc 9.2; hgb 14.2; hct 44.6; mcv 95.3 plt 217; glucose 199;  bun 21; creat 0.62; k+ 3.6; na++ 142; ca 8.7; mag 2.0 08-03-19: wbc 11.3; hgb 14.3; hct 44.5; mcv 93.9; plt 236; glucose 162; bun 19; creat 0.68; k+ 3.7; na++ 139; ca 8.4   NO NEW LABS.   Review of Systems  Reason unable to perform ROS: garbled speech.    Physical Exam Constitutional:      General: She is not in acute distress.    Appearance: She is well-developed. She is not diaphoretic.  Neck:     Thyroid: No thyromegaly.  Cardiovascular:     Rate and Rhythm: Normal rate and regular rhythm.     Pulses: Normal pulses.     Heart sounds: Normal heart sounds.  Pulmonary:     Effort: Pulmonary effort is normal. No respiratory distress.     Breath sounds: Normal breath sounds.  Abdominal:     General: Bowel sounds are normal. There is no distension.     Palpations: Abdomen is soft.     Tenderness: There is no abdominal tenderness.  Musculoskeletal:     Cervical back: Neck supple.     Right lower leg: No edema.     Left lower leg: No edema.     Comments: Does not move right extremities   Lymphadenopathy:     Cervical: No cervical adenopathy.   Skin:    General: Skin is warm and dry.  Neurological:     Mental Status: She is alert. Mental status is at baseline.  Psychiatric:        Mood and Affect: Mood normal.        ASSESSMENT/ PLAN:  TODAY  1. 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   MD is aware of resident's narcotic use and is in agreement with current plan of care. We will attempt to wean resident as appropriate.  Synthia Innocent NP [facility]  Contact [phone] Monday through Friday 8am- 5pm  After hours call [phone]

## 2019-09-21 ENCOUNTER — Telehealth: Payer: Self-pay | Admitting: Adult Health

## 2019-09-21 ENCOUNTER — Inpatient Hospital Stay: Payer: Medicare Other | Admitting: Adult Health

## 2019-09-21 NOTE — Telephone Encounter (Signed)
Mavis @[facility] called to report pt not well and asked appointment be r/s.

## 2019-09-21 NOTE — Telephone Encounter (Signed)
Noted  

## 2019-09-21 NOTE — Progress Notes (Unsigned)
[facility] [address] [place]. [place] [address] [phone]       HOSPITAL FOLLOW UP NOTE  [name] Date of Birth:  June 06, 1933 Medical Record Number:  [id number]   Reason for Referral:  hospital stroke follow up    CHIEF COMPLAINT:  No chief complaint on file.   HPI: [name] [name] being seen today for in office hospital follow-up regarding hypertensive left thalamic ICH with IVH on 07/25/2018.  History obtained from *** and chart review. Reviewed all radiology images and labs personally.  [name] is a 86 y.o. female with history of HTN, DB, CAD  who presented on 07/26/2019 with R sided weakness.   Evaluated by stroke team, Dr. Erlinda Hong and Dr. Leonie Man with stroke work-up revealing hypertensive left thalamic ICH with IVH with cytotoxic edema and left to right midline shift.  MRI negative for evidence of CAA.  MRA of head largely unremarkable except mild irregularity and stenosis of left P1 PCA.  2D echo unremarkable.  Found to be in hypertensive urgency with SBP greater than 200s on arrival requiring Cleviprex stabilized during admission and adjustment to home antihypertensive regimen.  Controlled DM type II with A1c 6.6 on Metformin.  LDL 120 and recommended initiating atorvastatin 20 mg daily at discharge.  Other stroke risk factors include advanced age and CAD but no prior history of stroke.  Was initiated on Seroquel for agitation with improvement.  Residual deficits of severe dysarthria, dysphagia and right hemiplegia and discharged to SNF for ongoing therapy needs.      ROS:   14 system review of systems performed and negative with exception of ***  PMH:  Past Medical History:  Diagnosis Date  . Arthritis   . Coronary artery disease    angina  . Diabetes mellitus without complication ([place])   . Hypertension     PSH:  Past Surgical History:  Procedure Laterality Date  . APPENDECTOMY    . CHOLECYSTECTOMY      Social  History:  Social History   Socioeconomic History  . Marital status: Widowed    Spouse name: Not on file  . Number of children: Not on file  . Years of education: Not on file  . Highest education level: Not on file  Occupational History  . Not on file  Tobacco Use  . Smoking status: Never Smoker  . Smokeless tobacco: Never Used  Substance and Sexual Activity  . Alcohol use: No  . Drug use: No  . Sexual activity: Not on file  Other Topics Concern  . Not on file  Social History Narrative  . Not on file   Social Determinants of Health   Financial Resource Strain:   . Difficulty of Paying Living Expenses: Not on file  Food Insecurity:   . Worried About Charity fundraiser in the Last Year: Not on file  . Ran Out of Food in the Last Year: Not on file  Transportation Needs:   . Lack of Transportation (Medical): Not on file  . Lack of Transportation (Non-Medical): Not on file  Physical Activity:   . Days of Exercise per Week: Not on file  . Minutes of Exercise per Session: Not on file  Stress:   . Feeling of Stress : Not on file  Social Connections:   . Frequency of Communication with Friends and Family: Not on file  . Frequency of Social Gatherings with Friends and Family: Not on file  . Attends Religious  Services: Not on file  . Active Member of Clubs or Organizations: Not on file  . Attends Banker Meetings: Not on file  . Marital Status: Not on file  Intimate Partner Violence:   . Fear of Current or Ex-Partner: Not on file  . Emotionally Abused: Not on file  . Physically Abused: Not on file  . Sexually Abused: Not on file    Family History: No family history on file.  Medications:   Current Outpatient Medications on File Prior to Visit  Medication Sig Dispense Refill  . amLODipine (NORVASC) 10 MG tablet Take 1 tablet (10 mg total) by mouth daily.    [name] atorvastatin (LIPITOR) 20 MG tablet Take 1 tablet (20 mg total) by mouth daily at 6 PM.    . Balsam  [place]-Castor Oil (VENELEX) OINT Apply topically. Apply to sacrum and bilateral buttocks qshift for prevention.    [name] lisinopril (ZESTRIL) 10 MG tablet Take 10 mg by mouth daily.    [name] LORazepam (ATIVAN) 0.5 MG tablet Take 1 tablet (0.5 mg total) by mouth at bedtime. 30 tablet 0  . metFORMIN (GLUCOPHAGE) 500 MG tablet Take 1 tablet by mouth 2 (two) times daily.  6  . NON FORMULARY Diet Change: Regular, carbohydrate consistent with thin liquids    . NON FORMULARY Accu-check qam. Notify provider of results under 60 or over 400. Once A Day    . sertraline (ZOLOFT) 50 MG tablet Take 75 mg by mouth daily.      No current facility-administered medications on file prior to visit.    Allergies:   Allergies  Allergen Reactions  . Contrast Media [Iodinated Diagnostic Agents] Anaphylaxis     Physical Exam  There were no vitals filed for this visit. There is no height or weight on file to calculate BMI. No exam data present  No flowsheet data found.   General: well developed, well nourished, seated, in no evident distress Head: head normocephalic and atraumatic.   Neck: supple with no carotid or supraclavicular bruits Cardiovascular: regular rate and rhythm, no murmurs Musculoskeletal: no deformity Skin:  no rash/petichiae Vascular:  Normal pulses all extremities   Neurologic Exam Mental Status: Awake and fully alert. Oriented to place and time. Recent and remote memory intact. Attention span, concentration and fund of knowledge appropriate. Mood and affect appropriate.  Cranial Nerves: Fundoscopic exam reveals sharp disc margins. Pupils equal, briskly reactive to light. Extraocular movements full without nystagmus. Visual fields full to confrontation. Hearing intact. Facial sensation intact. Face, tongue, palate moves normally and symmetrically.  Motor: Normal bulk and tone. Normal strength in all tested extremity muscles. Sensory.: intact to touch , pinprick , position and vibratory  sensation.  Coordination: Rapid alternating movements normal in all extremities. Finger-to-nose and heel-to-shin performed accurately bilaterally. Gait and Station: Arises from chair without difficulty. Stance is normal. Gait demonstrates normal stride length and balance Reflexes: 1+ and symmetric. Toes downgoing.     NIHSS  *** Modified Rankin  ***    Diagnostic Data (Labs, Imaging, Testing)   Code Stroke CT head 1/4 L thalamic ICH w/ IVH. Mild Small vessel disease.   Repeat CT head 1/5 stable hemorrhage w/ increasing surrounding edema. Increasing mass effect w/ partial effacement L lateral ventricle. Stable IVH  Repeat CT head 1/6 unchanged   MRI - Parenchymal hemorrhage involving the left thalamus and adjacent white matter with intraventricular extension. Associated mass effect is similar to recent CT. No CAA  MRA - Mild irregularity and stenosis  of left P1 PCA. Otherwise unremarkable MRA of the head.   2D Echo normal ejection fraction 65 to 70%.  No wall motion abnormality.   LDL 120   HgbA1c 6.6     ASSESSMENT: [name] [name] is a 86 y.o. year old female presented with right-sided weakness on 07/25/2018 with stroke work-up revealing hypertensive left thalamic ICH with IVH with cytotoxic edema and left to right midline shift. Vascular risk factors include HTN, HLD, DM and CAD.     PLAN:  1. Left thalamic ICH: Continue {anticoagulants:31417}  and atorvastatin 20 mg daily for secondary stroke prevention. Maintain strict control of hypertension with blood pressure goal below 130/90, diabetes with hemoglobin A1c goal below 6.5% and cholesterol with LDL cholesterol (bad cholesterol) goal below 70 mg/dL.  I also advised the patient to eat a healthy diet with plenty of whole grains, cereals, fruits and vegetables, exercise regularly with at least 30 minutes of continuous activity daily and maintain ideal body weight. 2. HTN: Advised to continue current treatment regimen.   Today's BP ***.  Advised to continue to monitor at home along with continued follow-up with PCP for management 3. HLD: Advised to continue current treatment regimen along with continued follow-up with PCP for future prescribing and monitoring of lipid panel 4. DMII: Advised to continue to monitor glucose levels at home along with continued follow-up with PCP for management and monitoring    Follow up in *** or call earlier if needed   Greater than 50% of time during this 45 minute visit was spent on counseling, explanation of diagnosis of left thalamic ICH, reviewing risk factor management of HTN, HLD and DM, planning of further management along with potential future management, and discussion with patient and family answering all questions.    Ihor Austin, AGNP-BC  [facility] [address] [place], [place] [address]  Phone [phone] Fax [phone] Note: This document was prepared with digital dictation and possible smart phrase technology. Any transcriptional errors that result from this process are unintentional.

## 2019-09-29 ENCOUNTER — Non-Acute Institutional Stay: Payer: Self-pay | Admitting: Adult Health

## 2019-09-29 DIAGNOSIS — F331 Major depressive disorder, recurrent, moderate: Secondary | ICD-10-CM | POA: Diagnosis not present

## 2019-10-04 NOTE — Progress Notes (Signed)
Location:   [facility] Room Number: 111 Place of Service:  SNF (31)   CODE STATUS: dnr  Allergies  Allergen Reactions  . Contrast Media [Iodinated Diagnostic Agents] Anaphylaxis    Chief Complaint  Patient presents with  . Acute Visit    medication review     HPI:  She is presently taking ativan 0.5 mg nightly and zoloft 75 mg daily. She is not engaging well with her surroundings. Spends most of her time in bed. It is felt that the 75 mg dose of zoloft is not effective. There are no reports of insomnia or changes in appetite.    Past Medical History:  Diagnosis Date  . Arthritis   . Coronary artery disease    angina  . Diabetes mellitus without complication ([place])   . Hypertension     Past Surgical History:  Procedure Laterality Date  . APPENDECTOMY    . CHOLECYSTECTOMY      Social History   Socioeconomic History  . Marital status: Widowed    Spouse name: Not on file  . Number of children: Not on file  . Years of education: Not on file  . Highest education level: Not on file  Occupational History  . Not on file  Tobacco Use  . Smoking status: Never Smoker  . Smokeless tobacco: Never Used  Substance and Sexual Activity  . Alcohol use: No  . Drug use: No  . Sexual activity: Not on file  Other Topics Concern  . Not on file  Social History Narrative  . Not on file   Social Determinants of Health   Financial Resource Strain:   . Difficulty of Paying Living Expenses:   Food Insecurity:   . Worried About Charity fundraiser in the Last Year:   . Arboriculturist in the Last Year:   Transportation Needs:   . Film/video editor (Medical):   [name] Lack of Transportation (Non-Medical):   Physical Activity:   . Days of Exercise per Week:   . Minutes of Exercise per Session:   Stress:   . Feeling of Stress :   Social Connections:   . Frequency of Communication with Friends and Family:   . Frequency of Social Gatherings with Friends and Family:    . Attends Religious Services:   . Active Member of Clubs or Organizations:   . Attends Archivist Meetings:   [name] Marital Status:   Intimate Partner Violence:   . Fear of Current or Ex-Partner:   . Emotionally Abused:   [name] Physically Abused:   . Sexually Abused:    No family history on file.    VITAL SIGNS BP 112/60   Pulse 70   Temp 98.6 F (37 C)   Ht 5\' 4"  (1.626 m)   Wt 158 lb (71.7 kg)   BMI 27.12 kg/m   Outpatient Encounter Medications as of 09/29/2019  Medication Sig  . amLODipine (NORVASC) 10 MG tablet Take 1 tablet (10 mg total) by mouth daily.  [name] atorvastatin (LIPITOR) 20 MG tablet Take 1 tablet (20 mg total) by mouth daily at 6 PM.  . Balsam [place]-Castor Oil (VENELEX) OINT Apply topically. Apply to sacrum and bilateral buttocks qshift for prevention.  [name] lisinopril (ZESTRIL) 10 MG tablet Take 10 mg by mouth daily.  [name] LORazepam (ATIVAN) 0.5 MG tablet Take 1 tablet (0.5 mg total) by mouth at bedtime.  . metFORMIN (GLUCOPHAGE) 500 MG tablet Take 1 tablet by  mouth 2 (two) times daily.  . NON FORMULARY Diet Change: Regular, carbohydrate consistent with thin liquids  . NON FORMULARY Accu-check qam. Notify provider of results under 60 or over 400. Once A Day  . sertraline (ZOLOFT) 50 MG tablet Take 75 mg by mouth daily.    No facility-administered encounter medications on file as of 09/29/2019.     SIGNIFICANT DIAGNOSTIC EXAMS  PREVIOUS   07-26-19: ct of head: 1. 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 2. Mild chronic small vessel ischemic disease.  07-27-19: ct of head:  1. Stable size of left thalamic and posterior limb internal capsule hemorrhage with increasing surrounding edema, as expected. 2. Increasing mass effect with partial effacement of the left lateral ventricle and 5 mm of midline shift. 3. Stable intraventricular hemorrhage.  07-26-18: 2-d echo:  Left Ventricle: Left ventricular ejection fraction, by visual estimation, is  65 to 70%. The left ventricle has normal function. The left ventricle is not well visualized. There is moderately increased left ventricular hypertrophy. Left ventricular  diastolic parameters are consistent with Grade I diastolic dysfunction (impaired relaxation). Elevated left ventricular end-diastolic pressure.   Right Ventricle: The right ventricular size is normal. No increase in right ventricular wall thickness. Global RV systolic function is has normal systolic function. Left Atrium: Left atrial size was normal in size. Right Atrium: Right atrial size was normal in size Pericardium: There is no evidence of pericardial effusion. Mitral Valve: The mitral valve is normal in structure. No evidence of mitral valve regurgitation. No evidence of mitral valve stenosis by observation. MV peak gradient, 7.4 mmHg. Tricuspid Valve: The tricuspid valve is normal in structure. Tricuspid valve regurgitation is trivial.   Aortic Valve: The aortic valve was not well visualized. Aortic valve regurgitation is not visualized. Mild aortic valve sclerosis is present, with no evidence of aortic valve stenosis. Aortic valve mean gradient measures 6.0 mmHg. Aortic valve peak  gradient measures 11.0 mmHg. Aortic valve area, by VTI measures 2.05 cm.  07-28-19: ct of head: 1. Unchanged size and appearance of left thalamic intraparenchymal hematoma with intraventricular extension. 2. Unchanged minimal rightward midline shift.  07-28-19: swallow study: Dysphagia 1 (Puree) solids;Honey thick liquids   07-31-19: MRI/MRA of head:  Parenchymal hemorrhage involving the left thalamus and adjacent white matter with intraventricular extension. Associated mass effect is similar to recent CT. No hydrocephalus. Moderate chronic microvascular ischemic changes. Mild irregularity and stenosis of left P1 PCA. Otherwise unremarkable MRA of the head.  NO NEW EXAMS.   LABS REVIEWED PREVIOUS  07-26-19: wbc 6.9; hgb 15.5; hct 45.8; mcv  91.4 plt 208; glucose 227; bun 15 creat 0.60; k+ 7.9; na++ 132; ca 8.9; liver normal albumin 3.2 hgb a1c 6.6 07-27-19: chol 205; ldl 120; trig 166; hdl 53 7-0-48: wbc 10.3; hgb 14.3; hct 43.5; mcv 91.8 plt 211; glucose 177; bun 21; creat 0.85; k+ 3.7; na++ 139; ca 8.4 08-01-19: wbc 9.6; hgb 14.5; ht 45.1; mcv 93.8 plt 213; glucose 131; bun 21; creat 0.67; k+ 3.7; na++ 148 ca 8.6 08-02-19: wbc 9.2; hgb 14.2; hct 44.6; mcv 95.3 plt 217; glucose 199; bun 21; creat 0.62; k+ 3.6; na++ 142; ca 8.7; mag 2.0 08-03-19: wbc 11.3; hgb 14.3; hct 44.5; mcv 93.9; plt 236; glucose 162; bun 19; creat 0.68; k+ 3.7; na++ 139; ca 8.4   NO NEW LABS.   Review of Systems  Reason unable to perform ROS: garbles speech.    Physical Exam Constitutional:      General: She is  not in acute distress.    Appearance: She is well-developed. She is not diaphoretic.  Neck:     Thyroid: No thyromegaly.  Cardiovascular:     Rate and Rhythm: Normal rate and regular rhythm.     Pulses: Normal pulses.     Heart sounds: Normal heart sounds.  Pulmonary:     Effort: Pulmonary effort is normal. No respiratory distress.     Breath sounds: Normal breath sounds.  Abdominal:     General: Bowel sounds are normal. There is no distension.     Palpations: Abdomen is soft.     Tenderness: There is no abdominal tenderness.  Musculoskeletal:     Cervical back: Neck supple.     Right lower leg: No edema.     Left lower leg: No edema.     Comments: Doe snot move right extremities   Lymphadenopathy:     Cervical: No cervical adenopathy.  Skin:    General: Skin is warm and dry.  Neurological:     Mental Status: She is alert. Mental status is at baseline.  Psychiatric:        Mood and Affect: Mood normal.       ASSESSMENT/ PLAN:  TODAY  1. Moderate episode of recurrent major depressive disorder: is not well managed: will continue ativan 0.5 mg nightly and will increase to zoloft 100 mg daily will monitor her status.    MD is  aware of resident's narcotic use and is in agreement with current plan of care. We will attempt to wean resident as appropriate.  Synthia Innocent NP [facility]  Contact [phone] Monday through Friday 8am- 5pm  After hours call [phone]

## 2019-10-06 ENCOUNTER — Non-Acute Institutional Stay (SKILLED_NURSING_FACILITY): Payer: Medicare Other | Admitting: Internal Medicine

## 2019-10-06 ENCOUNTER — Encounter: Payer: Self-pay | Admitting: Internal Medicine

## 2019-10-06 DIAGNOSIS — E1165 Type 2 diabetes mellitus with hyperglycemia: Secondary | ICD-10-CM

## 2019-10-06 DIAGNOSIS — F0630 Mood disorder due to known physiological condition, unspecified: Secondary | ICD-10-CM

## 2019-10-06 DIAGNOSIS — I639 Cerebral infarction, unspecified: Secondary | ICD-10-CM

## 2019-10-06 NOTE — Progress Notes (Signed)
Location:  [facility] Room Number: 114-W Place of Service:  SNF (31)  Ernestine Conrad, MD  Patient Care Team: Ernestine Conrad, MD as PCP - General [facility])  Extended Emergency Contact Information Primary Emergency Contact: Whitt,Gloria [place] [address] [place] Home Phone: [phone] Relation: Daughter Secondary Emergency Contact: Eldridge Abrahams Mobile Phone: [phone] Relation: Grandson    Allergies: Contrast media [iodinated diagnostic agents]  Chief Complaint  Patient presents with  . Medical Management of Chronic Issues    Routine [facility] visit    HPI: Patient is an 86 y.o. female who is being seen for routine issues of hypertension: Mood disorder, diabetes mellitus type 2  Past Medical History:  Diagnosis Date  . Arthritis   . Coronary artery disease    angina  . Diabetes mellitus without complication (HCC)   . Hypertension     Past Surgical History:  Procedure Laterality Date  . APPENDECTOMY    . CHOLECYSTECTOMY      Allergies as of 10/06/2019      Reactions   Contrast Media [iodinated Diagnostic Agents] Anaphylaxis      Medication List    Notice   This visit is during an admission. Changes to the med list made in this visit will be reflected in the After Visit Summary of the admission.     No orders of the defined types were placed in this encounter.    There is no immunization history on file for this patient.  Social History   Tobacco Use  . Smoking status: Never Smoker  . Smokeless tobacco: Never Used  Substance Use Topics  . Alcohol use: No    Review of Systems  GENERAL:  no fevers, fatigue, appetite changes SKIN: No itching, rash HEENT: No complaint RESPIRATORY: No cough, wheezing, SOB CARDIAC: No chest pain, palpitations, lower extremity edema  GI: No abdominal pain, No N/V/D or constipation, No heartburn or reflux  GU: No dysuria, frequency or urgency, or incontinence    MUSCULOSKELETAL: No unrelieved bone/joint pain NEUROLOGIC: No headache, dizziness  PSYCHIATRIC: No overt anxiety or sadness  Vitals:   10/06/19 1501  BP: 112/60  Pulse: 68  Resp: 18  Temp: 98.2 F (36.8 C)  SpO2: 96%   Body mass index is 27.12 kg/m. Physical Exam  GENERAL APPEARANCE: Alert, conversant, No acute distress  SKIN: No diaphoresis rash HEENT: Unremarkable RESPIRATORY: Breathing is even, unlabored. Lung sounds are clear   CARDIOVASCULAR: Heart RRR no murmurs, rubs or gallops. No peripheral edema  GASTROINTESTINAL: Abdomen is soft, non-tender, not distended w/ normal bowel sounds.  GENITOURINARY: Bladder non tender, not distended  MUSCULOSKELETAL: No abnormal joints or musculature NEUROLOGIC: Cranial nerves 2-12 grossly intact.  Aphasia improving, right-sided hemiparesis PSYCHIATRIC: Mood and affect appropriate to situation, no behavioral issues  Patient Active Problem List   Diagnosis Date Noted  . Weight loss 09/07/2019  . Major depression, recurrent (HCC) 09/07/2019  . Aphasia due to acute stroke (HCC) 08/15/2019  . 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 Dyslipidemia associated with type 2 diabetes mellitus (HCC) 08/04/2019  . Chronic constipation 08/04/2019  . Dysphagia due to recent stroke 08/04/2019  . Hemiplegia affecting right dominant side (HCC) 08/04/2019  . Hypertension associated with type 2 diabetes mellitus (HCC) 07/31/2019  . Hyperkalemia   . Controlled type 2 diabetes mellitus with hyperglycemia, without long-term current use of insulin (HCC)   . Hyponatremia   . 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 Brain  herniation ([place]) 07/27/2019  . IVH (intraventricular hemorrhage) (HCC) 07/26/2019    CMP     Component Value Date/Time   NA 139 08/03/2019 0243   NA 141 07/13/2013 0437   K 3.7 08/03/2019 0243   K 4.5 07/13/2013 0437   CL 105 08/03/2019 0243   CL 112 (H) 07/13/2013 0437   CO2 26 08/03/2019 0243   CO2 24  07/13/2013 0437   GLUCOSE 162 (H) 08/03/2019 0243   GLUCOSE 143 (H) 07/13/2013 0437   BUN 19 08/03/2019 0243   BUN 14 07/13/2013 0437   CREATININE 0.68 08/03/2019 0243   CREATININE 0.74 07/13/2013 0437   CALCIUM 8.4 (L) 08/03/2019 0243   CALCIUM 7.8 (L) 07/13/2013 0437   PROT 6.6 07/26/2019 2141   PROT 7.6 06/30/2013 1421   ALBUMIN 3.2 (L) 07/26/2019 2141   ALBUMIN 3.3 (L) 06/30/2013 1421   AST 41 07/26/2019 2141   AST 38 (H) 06/30/2013 1421   ALT 19 07/26/2019 2141   ALT 37 06/30/2013 1421   ALKPHOS 70 07/26/2019 2141   ALKPHOS 83 06/30/2013 1421   BILITOT 0.9 07/26/2019 2141   BILITOT 0.5 06/30/2013 1421   GFRNONAA >60 08/03/2019 0243   GFRNONAA >60 07/13/2013 0437   GFRAA >60 08/03/2019 0243   GFRAA >60 07/13/2013 0437   Recent Labs    08/01/19 0234 08/02/19 0143 08/03/19 0243  NA 148* 142 139  K 3.7 3.6 3.7  CL 110 108 105  CO2 29 27 26   GLUCOSE 131* 199* 162*  BUN 21 21 19   CREATININE 0.67 0.62 0.68  CALCIUM 8.6* 8.7* 8.4*  MG  --  2.0  --    Recent Labs    07/26/19 2141  AST 41  ALT 19  ALKPHOS 70  BILITOT 0.9  PROT 6.6  ALBUMIN 3.2*   Recent Labs    07/26/19 2045 07/26/19 2051 08/01/19 0234 08/02/19 0143 08/03/19 0243  WBC 6.9   < > 9.6 9.2 11.3*  NEUTROABS 6.1  --   --   --   --   HGB 15.5*   < > 14.5 14.2 14.3  HCT 45.8   < > 45.1 44.6 44.5  MCV 91.4   < > 93.8 95.3 93.9  PLT 208   < > 213 217 236   < > = values in this interval not displayed.   Recent Labs    07/27/19 1114  CHOL 205*  LDLCALC 120*  TRIG 161*   No results found for: [facility] Lab Results  Component Value Date   TSH 0.691 07/12/2013   Lab Results  Component Value Date   HGBA1C 6.6 (H) 07/26/2019   Lab Results  Component Value Date   CHOL 205 (H) 07/27/2019   HDL 53 07/27/2019   LDLCALC 120 (H) 07/27/2019   TRIG 161 (H) 07/27/2019   CHOLHDL 3.9 07/27/2019    Significant Diagnostic Results in last 30 days:  No results found.  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 twice daily    Hennie Duos, MD

## 2019-10-16 ENCOUNTER — Encounter: Payer: Self-pay | Admitting: Internal Medicine

## 2019-10-16 NOTE — Assessment & Plan Note (Signed)
Hemoglobin A1c 6.6, uncontrolled; continue Glucophage 500 mg p.o. twice daily

## 2019-10-16 NOTE — Assessment & Plan Note (Signed)
Controlled; continue Celexa 10 mg daily

## 2019-10-18 ENCOUNTER — Other Ambulatory Visit: Payer: Self-pay | Admitting: Adult Health

## 2019-10-18 MED ORDER — LORAZEPAM 0.5 MG PO TABS: 0.5000 mg | ORAL_TABLET | Freq: Every day | ORAL | 0 refills | Status: DC

## 2019-10-19 ENCOUNTER — Encounter: Payer: Self-pay | Admitting: Adult Health

## 2019-10-19 ENCOUNTER — Inpatient Hospital Stay: Payer: Medicare Other | Admitting: Adult Health

## 2019-10-19 ENCOUNTER — Non-Acute Institutional Stay (SKILLED_NURSING_FACILITY): Payer: Medicare Other | Admitting: Adult Health

## 2019-10-19 DIAGNOSIS — Z0000 Encounter for general adult medical examination without abnormal findings: Secondary | ICD-10-CM | POA: Insufficient documentation

## 2019-10-19 NOTE — Progress Notes (Signed)
Subjective:   [name] [name] is a 86 y.o. female who presents for Medicare Annual (Subsequent) preventive examination.  Long term resident of [facility]   Review of Systems:  Review of Systems  Reason unable to perform ROS: garbled speech.    Cardiac Risk Factors include: advanced age (>45men, >32 women);family history of premature cardiovascular disease;hypertension;diabetes mellitus;sedentary lifestyle     Objective:     Vitals: BP 124/75   Pulse (!) 57   Temp 98.4 F (36.9 C) (Oral)   Resp 16   Ht 5\' 4"  (1.626 m)   Wt 158 lb (71.7 kg)   SpO2 96%   BMI 27.12 kg/m   Body mass index is 27.12 kg/m.  Advanced Directives 10/19/2019 10/06/2019 09/20/2019 09/06/2019 09/03/2019 09/02/2019 08/26/2019  Does Patient Have a Medical Advance Directive? Yes Yes Yes Yes Yes Yes Yes  Type of Advance Directive Out of facility DNR (pink MOST or yellow form) Out of facility DNR (pink MOST or yellow form) Out of facility DNR (pink MOST or yellow form) Out of facility DNR (pink MOST or yellow form) Out of facility DNR (pink MOST or yellow form) Out of facility DNR (pink MOST or yellow form) Out of facility DNR (pink MOST or yellow form)  Does patient want to make changes to medical advance directive? No - Patient declined No - Patient declined No - Patient declined No - Patient declined No - Patient declined No - Patient declined No - Patient declined  Would patient like information on creating a medical advance directive? - - - - - - -  Pre-existing out of facility DNR order (yellow form or pink MOST form) - Yellow form placed in chart (order not valid for inpatient use) - - Physician notified to receive inpatient order Physician notified to receive inpatient order -    Tobacco Social History   Tobacco Use  Smoking Status Never Smoker  Smokeless Tobacco Never Used     Counseling given: Not Answered   Clinical Intake:  Pre-visit preparation completed: Yes  Pain : No/denies pain     BMI -  recorded: 27.12 Nutritional Status: BMI 25 -29 Overweight Diabetes: Yes CBG done?: (per nursing staff)     Interpreter Needed?: No     Past Medical History:  Diagnosis Date  . Arthritis   . Coronary artery disease    angina  . Diabetes mellitus without complication (HCC)   . Hypertension    Past Surgical History:  Procedure Laterality Date  . APPENDECTOMY    . CHOLECYSTECTOMY     No family history on file. Social History   Socioeconomic History  . Marital status: Widowed    Spouse name: Not on file  . Number of children: Not on file  . Years of education: Not on file  . Highest education level: Not on file  Occupational History  . Not on file  Tobacco Use  . Smoking status: Never Smoker  . Smokeless tobacco: Never Used  Substance and Sexual Activity  . Alcohol use: No  . Drug use: No  . Sexual activity: Not on file  Other Topics Concern  . Not on file  Social History Narrative  . Not on file   Social Determinants of Health   Financial Resource Strain:   . Difficulty of Paying Living Expenses:   Food Insecurity:   . Worried About 10/24/2019 in the Last Year:   . Programme researcher, broadcasting/film/video in the Last Year:   Barista  Needs:   . Lack of Transportation (Medical):   [name] Lack of Transportation (Non-Medical):   Physical Activity:   . Days of Exercise per Week:   . Minutes of Exercise per Session:   Stress:   . Feeling of Stress :   Social Connections:   . Frequency of Communication with Friends and Family:   . Frequency of Social Gatherings with Friends and Family:   . Attends Religious Services:   . Active Member of Clubs or Organizations:   . Attends Banker Meetings:   [name] Marital Status:     Outpatient Encounter Medications as of 10/19/2019  Medication Sig  . amLODipine (NORVASC) 10 MG tablet Take 1 tablet (10 mg total) by mouth daily.  [name] atorvastatin (LIPITOR) 20 MG tablet Take 1 tablet (20 mg total) by mouth daily at 6 PM.  .  Balsam [place]-Castor Oil (VENELEX) OINT Apply topically. Apply to sacrum and bilateral buttocks qshift for prevention.  . Chloroxylenol, Antiseptic, (PREP PROTECTIVE SKIN BARRIER EX) Apply topically. Apply skin prep to bilateral heels qshift for prevention. Every Shift Day, Evening, Night  . lisinopril (ZESTRIL) 10 MG tablet Take 10 mg by mouth daily.  [name] LORazepam (ATIVAN) 0.5 MG tablet Take 1 tablet (0.5 mg total) by mouth at bedtime.  . metFORMIN (GLUCOPHAGE) 500 MG tablet Take 1 tablet by mouth 2 (two) times daily.  . NON FORMULARY Diet Change: Regular, carbohydrate consistent with thin liquids  . NON FORMULARY Accu-check qam. Notify provider of results under 60 or over 400. Once A Day  . sertraline (ZOLOFT) 100 MG tablet Take 100 mg by mouth daily.   No facility-administered encounter medications on file as of 10/19/2019.    Activities of Daily Living In your present state of health, do you have any difficulty performing the following activities: 10/19/2019  Hearing? N  Vision? N  Difficulty concentrating or making decisions? Y  Walking or climbing stairs? Y  Dressing or bathing? Y  Doing errands, shopping? Y  Preparing Food and eating ? Y  Using the Toilet? Y  In the past six months, have you accidently leaked urine? Y  Do you have problems with loss of bowel control? Y  Managing your Medications? Y  Managing your Finances? Y  Housekeeping or managing your Housekeeping? Y  Some recent data might be hidden    Patient Care Team: Ernestine Conrad, MD as PCP - General (Family Medicine)    Assessment:   This is a routine wellness examination for [place].  Exercise Activities and Dietary recommendations Current Exercise Habits: The patient does not participate in regular exercise at present  Goals    . DIET - INCREASE WATER INTAKE    . Follow up with Primary Care Provider       Fall Risk Fall Risk  10/19/2019 02/19/2019  Falls in the past year? 0 0  Comment - Emmi Telephone  Survey: data to providers prior to load   Is the patient's home free of loose throw rugs in walkways, pet beds, electrical cords, etc?   yes      Grab bars in the bathroom? yes      Handrails on the stairs?   n/a      Adequate lighting?   yes  Timed Get Up and Go performed: unable to perform   Depression Screen PHQ 2/9 Scores 10/19/2019  Exception Documentation Other- indicate reason in comment box  Not completed patient unable to participate     Cognitive Function  There is no immunization history on file for this patient.  Qualifies for Shingles Vaccine?long term resident of SNF   Screening Tests Health Maintenance  Topic Date Due  . FOOT EXAM  Never done  . OPHTHALMOLOGY EXAM  Never done  . URINE MICROALBUMIN  Never done  . TETANUS/TDAP  Never done  . DEXA SCAN  Never done  . PNA vac Low Risk Adult (1 of 2 - PCV13) Never done  . INFLUENZA VACCINE  Never done  . HEMOGLOBIN A1C  01/23/2020    Cancer Screenings: Lung: Low Dose CT Chest recommended if Age 72-80 years, 30 pack-year currently smoking OR have quit w/in 15years. Patient does not  qualify. Breast:  Up to date on Mammogram? n/a   Up to date of Bone Density/Dexa? n/a Colorectal: n/a  Additional Screenings: n/a Hepatitis C Screening:      Plan:     I have personally reviewed and noted the following in the patient's chart:   . Medical and social history . Use of alcohol, tobacco or illicit drugs  . Current medications and supplements . Functional ability and status . Nutritional status . Physical activity . Advanced directives . List of other physicians . Hospitalizations, surgeries, and ER visits in previous 12 months . Vitals . Screenings to include cognitive, depression, and falls . Referrals and appointments  In addition, I have reviewed and discussed with patient certain preventive protocols, quality metrics, and best practice recommendations. A written personalized care plan for  preventive services as well as general preventive health recommendations were provided to patient.     Gerlene Fee, NP  10/19/2019

## 2019-10-19 NOTE — Patient Instructions (Signed)
  [name] [name] , Thank you for taking time to come for your Medicare Wellness Visit. I appreciate your ongoing commitment to your health goals. Please review the following plan we discussed and let me know if I can assist you in the future.   These are the goals we discussed: Goals    . DIET - INCREASE WATER INTAKE    . Follow up with Primary Care Provider       This is a list of the screening recommended for you and due dates:  Health Maintenance  Topic Date Due  . Complete foot exam   Never done  . Eye exam for diabetics  Never done  . Urine Protein Check  Never done  . Tetanus Vaccine  Never done  . DEXA scan (bone density measurement)  Never done  . Pneumonia vaccines (1 of 2 - PCV13) Never done  . Flu Shot  Never done  . Hemoglobin A1C  01/23/2020

## 2019-10-21 DIAGNOSIS — I69320 Aphasia following cerebral infarction: Secondary | ICD-10-CM | POA: Diagnosis not present

## 2019-10-21 DIAGNOSIS — R293 Abnormal posture: Secondary | ICD-10-CM | POA: Diagnosis not present

## 2019-10-21 DIAGNOSIS — R279 Unspecified lack of coordination: Secondary | ICD-10-CM | POA: Diagnosis not present

## 2019-10-21 DIAGNOSIS — R471 Dysarthria and anarthria: Secondary | ICD-10-CM | POA: Diagnosis not present

## 2019-10-21 DIAGNOSIS — Z741 Need for assistance with personal care: Secondary | ICD-10-CM | POA: Diagnosis not present

## 2019-10-21 DIAGNOSIS — R1312 Dysphagia, oropharyngeal phase: Secondary | ICD-10-CM | POA: Diagnosis not present

## 2019-10-21 DIAGNOSIS — I69351 Hemiplegia and hemiparesis following cerebral infarction affecting right dominant side: Secondary | ICD-10-CM | POA: Diagnosis not present

## 2019-10-21 DIAGNOSIS — M6281 Muscle weakness (generalized): Secondary | ICD-10-CM | POA: Diagnosis not present

## 2019-10-22 DIAGNOSIS — R293 Abnormal posture: Secondary | ICD-10-CM | POA: Diagnosis not present

## 2019-10-22 DIAGNOSIS — I69320 Aphasia following cerebral infarction: Secondary | ICD-10-CM | POA: Diagnosis not present

## 2019-10-22 DIAGNOSIS — Z741 Need for assistance with personal care: Secondary | ICD-10-CM | POA: Diagnosis not present

## 2019-10-22 DIAGNOSIS — R1312 Dysphagia, oropharyngeal phase: Secondary | ICD-10-CM | POA: Diagnosis not present

## 2019-10-22 DIAGNOSIS — R471 Dysarthria and anarthria: Secondary | ICD-10-CM | POA: Diagnosis not present

## 2019-10-22 DIAGNOSIS — I69351 Hemiplegia and hemiparesis following cerebral infarction affecting right dominant side: Secondary | ICD-10-CM | POA: Diagnosis not present

## 2019-10-25 DIAGNOSIS — R471 Dysarthria and anarthria: Secondary | ICD-10-CM | POA: Diagnosis not present

## 2019-10-25 DIAGNOSIS — I69320 Aphasia following cerebral infarction: Secondary | ICD-10-CM | POA: Diagnosis not present

## 2019-10-25 DIAGNOSIS — R293 Abnormal posture: Secondary | ICD-10-CM | POA: Diagnosis not present

## 2019-10-25 DIAGNOSIS — R1312 Dysphagia, oropharyngeal phase: Secondary | ICD-10-CM | POA: Diagnosis not present

## 2019-10-25 DIAGNOSIS — Z741 Need for assistance with personal care: Secondary | ICD-10-CM | POA: Diagnosis not present

## 2019-10-25 DIAGNOSIS — I69351 Hemiplegia and hemiparesis following cerebral infarction affecting right dominant side: Secondary | ICD-10-CM | POA: Diagnosis not present

## 2019-10-26 DIAGNOSIS — I69351 Hemiplegia and hemiparesis following cerebral infarction affecting right dominant side: Secondary | ICD-10-CM | POA: Diagnosis not present

## 2019-10-26 DIAGNOSIS — R293 Abnormal posture: Secondary | ICD-10-CM | POA: Diagnosis not present

## 2019-10-26 DIAGNOSIS — I69320 Aphasia following cerebral infarction: Secondary | ICD-10-CM | POA: Diagnosis not present

## 2019-10-26 DIAGNOSIS — Z741 Need for assistance with personal care: Secondary | ICD-10-CM | POA: Diagnosis not present

## 2019-10-26 DIAGNOSIS — Z1159 Encounter for screening for other viral diseases: Secondary | ICD-10-CM | POA: Diagnosis not present

## 2019-10-26 DIAGNOSIS — R471 Dysarthria and anarthria: Secondary | ICD-10-CM | POA: Diagnosis not present

## 2019-10-26 DIAGNOSIS — R1312 Dysphagia, oropharyngeal phase: Secondary | ICD-10-CM | POA: Diagnosis not present

## 2019-10-27 DIAGNOSIS — I69320 Aphasia following cerebral infarction: Secondary | ICD-10-CM | POA: Diagnosis not present

## 2019-10-27 DIAGNOSIS — Z741 Need for assistance with personal care: Secondary | ICD-10-CM | POA: Diagnosis not present

## 2019-10-27 DIAGNOSIS — R1312 Dysphagia, oropharyngeal phase: Secondary | ICD-10-CM | POA: Diagnosis not present

## 2019-10-27 DIAGNOSIS — R293 Abnormal posture: Secondary | ICD-10-CM | POA: Diagnosis not present

## 2019-10-27 DIAGNOSIS — R471 Dysarthria and anarthria: Secondary | ICD-10-CM | POA: Diagnosis not present

## 2019-10-27 DIAGNOSIS — I69351 Hemiplegia and hemiparesis following cerebral infarction affecting right dominant side: Secondary | ICD-10-CM | POA: Diagnosis not present

## 2019-10-28 DIAGNOSIS — R293 Abnormal posture: Secondary | ICD-10-CM | POA: Diagnosis not present

## 2019-10-28 DIAGNOSIS — R471 Dysarthria and anarthria: Secondary | ICD-10-CM | POA: Diagnosis not present

## 2019-10-28 DIAGNOSIS — Z741 Need for assistance with personal care: Secondary | ICD-10-CM | POA: Diagnosis not present

## 2019-10-28 DIAGNOSIS — I69320 Aphasia following cerebral infarction: Secondary | ICD-10-CM | POA: Diagnosis not present

## 2019-10-28 DIAGNOSIS — R1312 Dysphagia, oropharyngeal phase: Secondary | ICD-10-CM | POA: Diagnosis not present

## 2019-10-28 DIAGNOSIS — I69351 Hemiplegia and hemiparesis following cerebral infarction affecting right dominant side: Secondary | ICD-10-CM | POA: Diagnosis not present

## 2019-10-29 DIAGNOSIS — R471 Dysarthria and anarthria: Secondary | ICD-10-CM | POA: Diagnosis not present

## 2019-10-29 DIAGNOSIS — Z741 Need for assistance with personal care: Secondary | ICD-10-CM | POA: Diagnosis not present

## 2019-10-29 DIAGNOSIS — I69351 Hemiplegia and hemiparesis following cerebral infarction affecting right dominant side: Secondary | ICD-10-CM | POA: Diagnosis not present

## 2019-10-29 DIAGNOSIS — R293 Abnormal posture: Secondary | ICD-10-CM | POA: Diagnosis not present

## 2019-10-29 DIAGNOSIS — I69320 Aphasia following cerebral infarction: Secondary | ICD-10-CM | POA: Diagnosis not present

## 2019-10-29 DIAGNOSIS — R1312 Dysphagia, oropharyngeal phase: Secondary | ICD-10-CM | POA: Diagnosis not present

## 2019-11-01 DIAGNOSIS — I69320 Aphasia following cerebral infarction: Secondary | ICD-10-CM | POA: Diagnosis not present

## 2019-11-01 DIAGNOSIS — R471 Dysarthria and anarthria: Secondary | ICD-10-CM | POA: Diagnosis not present

## 2019-11-01 DIAGNOSIS — R293 Abnormal posture: Secondary | ICD-10-CM | POA: Diagnosis not present

## 2019-11-01 DIAGNOSIS — Z741 Need for assistance with personal care: Secondary | ICD-10-CM | POA: Diagnosis not present

## 2019-11-01 DIAGNOSIS — R1312 Dysphagia, oropharyngeal phase: Secondary | ICD-10-CM | POA: Diagnosis not present

## 2019-11-01 DIAGNOSIS — I69351 Hemiplegia and hemiparesis following cerebral infarction affecting right dominant side: Secondary | ICD-10-CM | POA: Diagnosis not present

## 2019-11-02 DIAGNOSIS — Z741 Need for assistance with personal care: Secondary | ICD-10-CM | POA: Diagnosis not present

## 2019-11-02 DIAGNOSIS — R1312 Dysphagia, oropharyngeal phase: Secondary | ICD-10-CM | POA: Diagnosis not present

## 2019-11-02 DIAGNOSIS — I69320 Aphasia following cerebral infarction: Secondary | ICD-10-CM | POA: Diagnosis not present

## 2019-11-02 DIAGNOSIS — I69351 Hemiplegia and hemiparesis following cerebral infarction affecting right dominant side: Secondary | ICD-10-CM | POA: Diagnosis not present

## 2019-11-02 DIAGNOSIS — R471 Dysarthria and anarthria: Secondary | ICD-10-CM | POA: Diagnosis not present

## 2019-11-02 DIAGNOSIS — R293 Abnormal posture: Secondary | ICD-10-CM | POA: Diagnosis not present

## 2019-11-03 DIAGNOSIS — I69351 Hemiplegia and hemiparesis following cerebral infarction affecting right dominant side: Secondary | ICD-10-CM | POA: Diagnosis not present

## 2019-11-03 DIAGNOSIS — I69320 Aphasia following cerebral infarction: Secondary | ICD-10-CM | POA: Diagnosis not present

## 2019-11-03 DIAGNOSIS — R293 Abnormal posture: Secondary | ICD-10-CM | POA: Diagnosis not present

## 2019-11-03 DIAGNOSIS — R1312 Dysphagia, oropharyngeal phase: Secondary | ICD-10-CM | POA: Diagnosis not present

## 2019-11-03 DIAGNOSIS — R471 Dysarthria and anarthria: Secondary | ICD-10-CM | POA: Diagnosis not present

## 2019-11-03 DIAGNOSIS — Z741 Need for assistance with personal care: Secondary | ICD-10-CM | POA: Diagnosis not present

## 2019-11-04 DIAGNOSIS — Z741 Need for assistance with personal care: Secondary | ICD-10-CM | POA: Diagnosis not present

## 2019-11-04 DIAGNOSIS — R1312 Dysphagia, oropharyngeal phase: Secondary | ICD-10-CM | POA: Diagnosis not present

## 2019-11-04 DIAGNOSIS — I69320 Aphasia following cerebral infarction: Secondary | ICD-10-CM | POA: Diagnosis not present

## 2019-11-04 DIAGNOSIS — R293 Abnormal posture: Secondary | ICD-10-CM | POA: Diagnosis not present

## 2019-11-04 DIAGNOSIS — I69351 Hemiplegia and hemiparesis following cerebral infarction affecting right dominant side: Secondary | ICD-10-CM | POA: Diagnosis not present

## 2019-11-04 DIAGNOSIS — R471 Dysarthria and anarthria: Secondary | ICD-10-CM | POA: Diagnosis not present

## 2019-11-05 DIAGNOSIS — R471 Dysarthria and anarthria: Secondary | ICD-10-CM | POA: Diagnosis not present

## 2019-11-05 DIAGNOSIS — I69351 Hemiplegia and hemiparesis following cerebral infarction affecting right dominant side: Secondary | ICD-10-CM | POA: Diagnosis not present

## 2019-11-05 DIAGNOSIS — I69320 Aphasia following cerebral infarction: Secondary | ICD-10-CM | POA: Diagnosis not present

## 2019-11-05 DIAGNOSIS — R293 Abnormal posture: Secondary | ICD-10-CM | POA: Diagnosis not present

## 2019-11-05 DIAGNOSIS — R1312 Dysphagia, oropharyngeal phase: Secondary | ICD-10-CM | POA: Diagnosis not present

## 2019-11-05 DIAGNOSIS — Z741 Need for assistance with personal care: Secondary | ICD-10-CM | POA: Diagnosis not present

## 2019-11-06 DIAGNOSIS — R293 Abnormal posture: Secondary | ICD-10-CM | POA: Diagnosis not present

## 2019-11-06 DIAGNOSIS — R1312 Dysphagia, oropharyngeal phase: Secondary | ICD-10-CM | POA: Diagnosis not present

## 2019-11-06 DIAGNOSIS — R471 Dysarthria and anarthria: Secondary | ICD-10-CM | POA: Diagnosis not present

## 2019-11-06 DIAGNOSIS — I69320 Aphasia following cerebral infarction: Secondary | ICD-10-CM | POA: Diagnosis not present

## 2019-11-06 DIAGNOSIS — Z741 Need for assistance with personal care: Secondary | ICD-10-CM | POA: Diagnosis not present

## 2019-11-06 DIAGNOSIS — I69351 Hemiplegia and hemiparesis following cerebral infarction affecting right dominant side: Secondary | ICD-10-CM | POA: Diagnosis not present

## 2019-11-09 ENCOUNTER — Non-Acute Institutional Stay (SKILLED_NURSING_FACILITY): Payer: Medicare Other | Admitting: Adult Health

## 2019-11-09 ENCOUNTER — Encounter: Payer: Self-pay | Admitting: Adult Health

## 2019-11-09 DIAGNOSIS — R293 Abnormal posture: Secondary | ICD-10-CM | POA: Diagnosis not present

## 2019-11-09 DIAGNOSIS — Z741 Need for assistance with personal care: Secondary | ICD-10-CM | POA: Diagnosis not present

## 2019-11-09 DIAGNOSIS — E1169 Type 2 diabetes mellitus with other specified complication: Secondary | ICD-10-CM

## 2019-11-09 DIAGNOSIS — I69320 Aphasia following cerebral infarction: Secondary | ICD-10-CM | POA: Diagnosis not present

## 2019-11-09 DIAGNOSIS — I10 Essential (primary) hypertension: Secondary | ICD-10-CM | POA: Diagnosis not present

## 2019-11-09 DIAGNOSIS — E1159 Type 2 diabetes mellitus with other circulatory complications: Secondary | ICD-10-CM | POA: Diagnosis not present

## 2019-11-09 DIAGNOSIS — R1312 Dysphagia, oropharyngeal phase: Secondary | ICD-10-CM | POA: Diagnosis not present

## 2019-11-09 DIAGNOSIS — K5909 Other constipation: Secondary | ICD-10-CM

## 2019-11-09 DIAGNOSIS — R471 Dysarthria and anarthria: Secondary | ICD-10-CM | POA: Diagnosis not present

## 2019-11-09 DIAGNOSIS — E785 Hyperlipidemia, unspecified: Secondary | ICD-10-CM | POA: Diagnosis not present

## 2019-11-09 DIAGNOSIS — I152 Hypertension secondary to endocrine disorders: Secondary | ICD-10-CM

## 2019-11-09 DIAGNOSIS — I69351 Hemiplegia and hemiparesis following cerebral infarction affecting right dominant side: Secondary | ICD-10-CM | POA: Diagnosis not present

## 2019-11-09 NOTE — Progress Notes (Signed)
Location:    [facility] Room Number: 114/W Place of Service:  SNF (31)   CODE STATUS: DNR  Allergies  Allergen Reactions  . Contrast Media [Iodinated Diagnostic Agents] Anaphylaxis    Chief Complaint  Patient presents with  . Medical Management of Chronic Issues         Dyslipidemia associated with type 2 diabetes mellitis:  Chronic constipation: Hypertension associated with type 2 diabetes mellitus     HPI:  She is a 86 year old long term resident of this facility being seen for the management of her chronic illnesses: dyslipidemia; constipation; hypertension. No reports of uncontrolled pain; no reports of aspiration; no reports of agitation or anxiety.   Past Medical History:  Diagnosis Date  . Arthritis   . Coronary artery disease    angina  . Diabetes mellitus without complication (HCC)   . Hypertension     Past Surgical History:  Procedure Laterality Date  . APPENDECTOMY    . CHOLECYSTECTOMY      Social History   Socioeconomic History  . Marital status: Widowed    Spouse name: Not on file  . Number of children: Not on file  . Years of education: Not on file  . Highest education level: Not on file  Occupational History  . Not on file  Tobacco Use  . Smoking status: Never Smoker  . Smokeless tobacco: Never Used  Substance and Sexual Activity  . Alcohol use: No  . Drug use: No  . Sexual activity: Not on file  Other Topics Concern  . Not on file  Social History Narrative  . Not on file   Social Determinants of Health   Financial Resource Strain:   . Difficulty of Paying Living Expenses:   Food Insecurity:   . Worried About Programme researcher, broadcasting/film/video in the Last Year:   . Barista in the Last Year:   Transportation Needs:   . Freight forwarder (Medical):   [name] Lack of Transportation (Non-Medical):   Physical Activity:   . Days of Exercise per Week:   . Minutes of Exercise per Session:   Stress:   . Feeling of Stress :    Social Connections:   . Frequency of Communication with Friends and Family:   . Frequency of Social Gatherings with Friends and Family:   . Attends Religious Services:   . Active Member of Clubs or Organizations:   . Attends Banker Meetings:   [name] Marital Status:   Intimate Partner Violence:   . Fear of Current or Ex-Partner:   . Emotionally Abused:   [name] Physically Abused:   . Sexually Abused:    No family history on file.    VITAL SIGNS BP 132/67   Pulse 73   Temp 98.4 F (36.9 C) (Oral)   Resp 20   Ht 5\' 4"  (1.626 m)   Wt 158 lb 12.8 oz (72 kg)   SpO2 96%   BMI 27.26 kg/m   Outpatient Encounter Medications as of 11/09/2019  Medication Sig  . acetaminophen (TYLENOL) 325 MG tablet Take 650 mg by mouth 2 (two) times daily as needed.  11/11/2019 amLODipine (NORVASC) 10 MG tablet Take 1 tablet (10 mg total) by mouth daily.  [name] atorvastatin (LIPITOR) 20 MG tablet Take 1 tablet (20 mg total) by mouth daily at 6 PM.  . Balsam [place]-Castor Oil (VENELEX) OINT Apply topically. Apply to sacrum and bilateral buttocks qshift for prevention.  [name]  lisinopril (ZESTRIL) 10 MG tablet Take 10 mg by mouth daily.  [name] LORazepam (ATIVAN) 0.5 MG tablet Take 1 tablet (0.5 mg total) by mouth at bedtime.  . metFORMIN (GLUCOPHAGE) 500 MG tablet Take 1 tablet by mouth 2 (two) times daily.  . NON FORMULARY Diet Change: Regular, carbohydrate consistent with thin liquids  . NON FORMULARY Accu-check qam. Notify provider of results under 60 or over 400. Once A Day  . sertraline (ZOLOFT) 100 MG tablet Take 100 mg by mouth daily.  . [DISCONTINUED] Chloroxylenol, Antiseptic, (PREP PROTECTIVE SKIN BARRIER EX) Apply topically. Apply skin prep to bilateral heels qshift for prevention. Every Shift Day, Evening, Night   No facility-administered encounter medications on file as of 11/09/2019.     SIGNIFICANT DIAGNOSTIC EXAMS  PREVIOUS   07-26-19: ct of head: 1. Acute left thalamic hemorrhage with  intraventricular extension. 2. Mild chronic small vessel ischemic disease.  07-27-19: ct of head:  1. Stable size of left thalamic and posterior limb internal capsule hemorrhage with increasing surrounding edema, as expected. 2. Increasing mass effect with partial effacement of the left lateral ventricle and 5 mm of midline shift. 3. Stable intraventricular hemorrhage.  07-26-18: 2-d echo:  Left Ventricle: Left ventricular ejection fraction, by visual estimation, is 65 to 70%. The left ventricle has normal function. The left ventricle is not well visualized. There is moderately increased left ventricular hypertrophy. Left ventricular  diastolic parameters are consistent with Grade I diastolic dysfunction (impaired relaxation). Elevated left ventricular end-diastolic pressure.   Right Ventricle: The right ventricular size is normal. No increase in right ventricular wall thickness. Global RV systolic function is has normal systolic function. Left Atrium: Left atrial size was normal in size. Right Atrium: Right atrial size was normal in size Pericardium: There is no evidence of pericardial effusion. Mitral Valve: The mitral valve is normal in structure. No evidence of mitral valve regurgitation. No evidence of mitral valve stenosis by observation. MV peak gradient, 7.4 mmHg. Tricuspid Valve: The tricuspid valve is normal in structure. Tricuspid valve regurgitation is trivial.   Aortic Valve: The aortic valve was not well visualized. Aortic valve regurgitation is not visualized. Mild aortic valve sclerosis is present, with no evidence of aortic valve stenosis. Aortic valve mean gradient measures 6.0 mmHg. Aortic valve peak  gradient measures 11.0 mmHg. Aortic valve area, by VTI measures 2.05 cm.  07-28-19: ct of head: 1. Unchanged size and appearance of left thalamic intraparenchymal hematoma with intraventricular extension. 2. Unchanged minimal rightward midline shift.  07-28-19: swallow study: Dysphagia  1 (Puree) solids;Honey thick liquids   07-31-19: MRI/MRA of head:  Parenchymal hemorrhage involving the left thalamus and adjacent white matter with intraventricular extension. Associated mass effect is similar to recent CT. No hydrocephalus. Moderate chronic microvascular ischemic changes. Mild irregularity and stenosis of left P1 PCA. Otherwise unremarkable MRA of the head.  NO NEW EXAMS.   LABS REVIEWED PREVIOUS  07-26-19: wbc 6.9; hgb 15.5; hct 45.8; mcv 91.4 plt 208; glucose 227; bun 15 creat 0.60; k+ 7.9; na++ 132; ca 8.9; liver normal albumin 3.2 hgb a1c 6.6 07-27-19: chol 205; ldl 120; trig 166; hdl 53 07-30-19: wbc 10.3; hgb 14.3; hct 43.5; mcv 91.8 plt 211; glucose 177; bun 21; creat 0.85; k+ 3.7; na++ 139; ca 8.4 08-01-19: wbc 9.6; hgb 14.5; ht 45.1; mcv 93.8 plt 213; glucose 131; bun 21; creat 0.67; k+ 3.7; na++ 148 ca 8.6 08-02-19: wbc 9.2; hgb 14.2; hct 44.6; mcv 95.3 plt 217; glucose 199; bun 21; creat  0.62; k+ 3.6; na++ 142; ca 8.7; mag 2.0 08-03-19: wbc 11.3; hgb 14.3; hct 44.5; mcv 93.9; plt 236; glucose 162; bun 19; creat 0.68; k+ 3.7; na++ 139; ca 8.4   NO NEW LABS.   Review of Systems  Reason unable to perform ROS: garbled speech.     Physical Exam Constitutional:      General: She is not in acute distress.    Appearance: She is well-developed. She is not diaphoretic.  Neck:     Thyroid: No thyromegaly.  Cardiovascular:     Rate and Rhythm: Normal rate and regular rhythm.     Pulses: Normal pulses.     Heart sounds: Normal heart sounds.  Pulmonary:     Effort: Pulmonary effort is normal. No respiratory distress.     Breath sounds: Normal breath sounds.  Abdominal:     General: Bowel sounds are normal. There is no distension.     Palpations: Abdomen is soft.     Tenderness: There is no abdominal tenderness.  Musculoskeletal:     Cervical back: Neck supple.     Right lower leg: No edema.     Left lower leg: No edema.     Comments: Does not move right extremities    Lymphadenopathy:     Cervical: No cervical adenopathy.  Skin:    General: Skin is warm and dry.  Neurological:     Mental Status: She is alert. Mental status is at baseline.  Psychiatric:        Mood and Affect: Mood normal.      ASSESSMENT/ PLAN:  TODAY  1. Dyslipidemia associated with type 2 diabetes mellitis: is stable LDL 120 will continue lipitor 20 mg daily  2. Chronic constipation: is stable will continue senna s twice daily   3. Hypertension associated with type 2 diabetes mellitus is stable b/p 132/67 will continue norvasc 10 mg daily and lisinopril 10 mg daily   PREVIOUS  4. Controlled type 2 diabetes mellitus with hyperglycemia without long term current use of insulin: is stable hgb a1c 6.6; will continue metformin 500 mg twice daily: is on ace statin not a candidate for asa   5. Moderate episode of recurrent major depression: is stable will continue zoloft 100  mg daily will continue ativan 0.5 mg nightly   6. IVH (intraventriculate hemorrhage) hemiplegia right side: is stable will monitor her status.   7. Dysphagia due to recent stroke: no signs of aspiration present will continue nectar thick liquids.        MD is aware of resident's narcotic use and is in agreement with current plan of care. We will attempt to wean resident as appropriate.  Synthia Innocent NP [facility]  Contact [phone] Monday through Friday 8am- 5pm  After hours call [phone]

## 2019-11-10 DIAGNOSIS — R1312 Dysphagia, oropharyngeal phase: Secondary | ICD-10-CM | POA: Diagnosis not present

## 2019-11-10 DIAGNOSIS — Z741 Need for assistance with personal care: Secondary | ICD-10-CM | POA: Diagnosis not present

## 2019-11-10 DIAGNOSIS — I69320 Aphasia following cerebral infarction: Secondary | ICD-10-CM | POA: Diagnosis not present

## 2019-11-10 DIAGNOSIS — I69351 Hemiplegia and hemiparesis following cerebral infarction affecting right dominant side: Secondary | ICD-10-CM | POA: Diagnosis not present

## 2019-11-10 DIAGNOSIS — R471 Dysarthria and anarthria: Secondary | ICD-10-CM | POA: Diagnosis not present

## 2019-11-10 DIAGNOSIS — R293 Abnormal posture: Secondary | ICD-10-CM | POA: Diagnosis not present

## 2019-11-12 ENCOUNTER — Other Ambulatory Visit: Payer: Self-pay | Admitting: Adult Health

## 2019-11-12 DIAGNOSIS — I69351 Hemiplegia and hemiparesis following cerebral infarction affecting right dominant side: Secondary | ICD-10-CM | POA: Diagnosis not present

## 2019-11-12 DIAGNOSIS — R1312 Dysphagia, oropharyngeal phase: Secondary | ICD-10-CM | POA: Diagnosis not present

## 2019-11-12 DIAGNOSIS — R471 Dysarthria and anarthria: Secondary | ICD-10-CM | POA: Diagnosis not present

## 2019-11-12 DIAGNOSIS — I69320 Aphasia following cerebral infarction: Secondary | ICD-10-CM | POA: Diagnosis not present

## 2019-11-12 DIAGNOSIS — R293 Abnormal posture: Secondary | ICD-10-CM | POA: Diagnosis not present

## 2019-11-12 DIAGNOSIS — Z741 Need for assistance with personal care: Secondary | ICD-10-CM | POA: Diagnosis not present

## 2019-11-12 MED ORDER — LORAZEPAM 0.5 MG PO TABS: 0.5000 mg | ORAL_TABLET | Freq: Every day | ORAL | 0 refills | Status: DC

## 2019-11-15 DIAGNOSIS — I69351 Hemiplegia and hemiparesis following cerebral infarction affecting right dominant side: Secondary | ICD-10-CM | POA: Diagnosis not present

## 2019-11-15 DIAGNOSIS — I69320 Aphasia following cerebral infarction: Secondary | ICD-10-CM | POA: Diagnosis not present

## 2019-11-15 DIAGNOSIS — R471 Dysarthria and anarthria: Secondary | ICD-10-CM | POA: Diagnosis not present

## 2019-11-15 DIAGNOSIS — R1312 Dysphagia, oropharyngeal phase: Secondary | ICD-10-CM | POA: Diagnosis not present

## 2019-11-15 DIAGNOSIS — Z741 Need for assistance with personal care: Secondary | ICD-10-CM | POA: Diagnosis not present

## 2019-11-15 DIAGNOSIS — R293 Abnormal posture: Secondary | ICD-10-CM | POA: Diagnosis not present

## 2019-11-16 DIAGNOSIS — Z741 Need for assistance with personal care: Secondary | ICD-10-CM | POA: Diagnosis not present

## 2019-11-16 DIAGNOSIS — I69351 Hemiplegia and hemiparesis following cerebral infarction affecting right dominant side: Secondary | ICD-10-CM | POA: Diagnosis not present

## 2019-11-16 DIAGNOSIS — R471 Dysarthria and anarthria: Secondary | ICD-10-CM | POA: Diagnosis not present

## 2019-11-16 DIAGNOSIS — R293 Abnormal posture: Secondary | ICD-10-CM | POA: Diagnosis not present

## 2019-11-16 DIAGNOSIS — I69320 Aphasia following cerebral infarction: Secondary | ICD-10-CM | POA: Diagnosis not present

## 2019-11-16 DIAGNOSIS — R1312 Dysphagia, oropharyngeal phase: Secondary | ICD-10-CM | POA: Diagnosis not present

## 2019-11-17 DIAGNOSIS — I69351 Hemiplegia and hemiparesis following cerebral infarction affecting right dominant side: Secondary | ICD-10-CM | POA: Diagnosis not present

## 2019-11-17 DIAGNOSIS — Z741 Need for assistance with personal care: Secondary | ICD-10-CM | POA: Diagnosis not present

## 2019-11-17 DIAGNOSIS — R293 Abnormal posture: Secondary | ICD-10-CM | POA: Diagnosis not present

## 2019-11-17 DIAGNOSIS — Z1159 Encounter for screening for other viral diseases: Secondary | ICD-10-CM | POA: Diagnosis not present

## 2019-11-17 DIAGNOSIS — R1312 Dysphagia, oropharyngeal phase: Secondary | ICD-10-CM | POA: Diagnosis not present

## 2019-11-17 DIAGNOSIS — I69320 Aphasia following cerebral infarction: Secondary | ICD-10-CM | POA: Diagnosis not present

## 2019-11-17 DIAGNOSIS — R471 Dysarthria and anarthria: Secondary | ICD-10-CM | POA: Diagnosis not present

## 2019-11-18 ENCOUNTER — Non-Acute Institutional Stay (SKILLED_NURSING_FACILITY): Payer: Medicare Other | Admitting: Adult Health

## 2019-11-18 DIAGNOSIS — I69351 Hemiplegia and hemiparesis following cerebral infarction affecting right dominant side: Secondary | ICD-10-CM

## 2019-11-18 DIAGNOSIS — F0630 Mood disorder due to known physiological condition, unspecified: Secondary | ICD-10-CM | POA: Diagnosis not present

## 2019-11-18 DIAGNOSIS — I639 Cerebral infarction, unspecified: Secondary | ICD-10-CM | POA: Diagnosis not present

## 2019-11-18 DIAGNOSIS — I615 Nontraumatic intracerebral hemorrhage, intraventricular: Secondary | ICD-10-CM | POA: Diagnosis not present

## 2019-11-18 DIAGNOSIS — Z741 Need for assistance with personal care: Secondary | ICD-10-CM | POA: Diagnosis not present

## 2019-11-18 DIAGNOSIS — R471 Dysarthria and anarthria: Secondary | ICD-10-CM | POA: Diagnosis not present

## 2019-11-18 DIAGNOSIS — I69320 Aphasia following cerebral infarction: Secondary | ICD-10-CM | POA: Diagnosis not present

## 2019-11-18 DIAGNOSIS — R1312 Dysphagia, oropharyngeal phase: Secondary | ICD-10-CM | POA: Diagnosis not present

## 2019-11-18 DIAGNOSIS — R293 Abnormal posture: Secondary | ICD-10-CM | POA: Diagnosis not present

## 2019-11-19 DIAGNOSIS — I69351 Hemiplegia and hemiparesis following cerebral infarction affecting right dominant side: Secondary | ICD-10-CM | POA: Diagnosis not present

## 2019-11-19 DIAGNOSIS — R293 Abnormal posture: Secondary | ICD-10-CM | POA: Diagnosis not present

## 2019-11-19 DIAGNOSIS — R1312 Dysphagia, oropharyngeal phase: Secondary | ICD-10-CM | POA: Diagnosis not present

## 2019-11-19 DIAGNOSIS — Z741 Need for assistance with personal care: Secondary | ICD-10-CM | POA: Diagnosis not present

## 2019-11-19 DIAGNOSIS — R471 Dysarthria and anarthria: Secondary | ICD-10-CM | POA: Diagnosis not present

## 2019-11-19 DIAGNOSIS — I69320 Aphasia following cerebral infarction: Secondary | ICD-10-CM | POA: Diagnosis not present

## 2019-11-22 NOTE — Progress Notes (Signed)
Location:   [facility] Room Number: 222 Place of Service:  SNF (31)   CODE STATUS: dnr  Allergies  Allergen Reactions  . Contrast Media [Iodinated Diagnostic Agents] Anaphylaxis    Chief Complaint  Patient presents with  . Acute Visit    care plan meeting     HPI:  We have come together for her care plan meeting. BIMS 13/15 mood 9/30. No reports of falls. Her weight is stable. She requires extensive assist with adls is incontinent of bowel and bladder. She will continue to be followed for her chronic illnesses including: hemiplegia; ivh mood disorder.   Past Medical History:  Diagnosis Date  . Arthritis   . Coronary artery disease    angina  . Diabetes mellitus without complication (HCC)   . Hypertension     Past Surgical History:  Procedure Laterality Date  . APPENDECTOMY    . CHOLECYSTECTOMY      Social History   Socioeconomic History  . Marital status: Widowed    Spouse name: Not on file  . Number of children: Not on file  . Years of education: Not on file  . Highest education level: Not on file  Occupational History  . Not on file  Tobacco Use  . Smoking status: Never Smoker  . Smokeless tobacco: Never Used  Substance and Sexual Activity  . Alcohol use: No  . Drug use: No  . Sexual activity: Not on file  Other Topics Concern  . Not on file  Social History Narrative  . Not on file   Social Determinants of Health   Financial Resource Strain:   . Difficulty of Paying Living Expenses:   Food Insecurity:   . Worried About Programme researcher, broadcasting/film/video in the Last Year:   . Barista in the Last Year:   Transportation Needs:   . Freight forwarder (Medical):   [name] Lack of Transportation (Non-Medical):   Physical Activity:   . Days of Exercise per Week:   . Minutes of Exercise per Session:   Stress:   . Feeling of Stress :   Social Connections:   . Frequency of Communication with Friends and Family:   . Frequency of Social Gatherings  with Friends and Family:   . Attends Religious Services:   . Active Member of Clubs or Organizations:   . Attends Banker Meetings:   [name] Marital Status:   Intimate Partner Violence:   . Fear of Current or Ex-Partner:   . Emotionally Abused:   [name] Physically Abused:   . Sexually Abused:    No family history on file.    VITAL SIGNS BP 132/67   Pulse 74   Temp 97.7 F (36.5 C)   Resp 16   Ht 5\' 4"  (1.626 m)   Wt 158 lb 12.8 oz (72 kg)   SpO2 97%   BMI 27.26 kg/m   Outpatient Encounter Medications as of 11/18/2019  Medication Sig  . acetaminophen (TYLENOL) 325 MG tablet Take 650 mg by mouth 2 (two) times daily as needed.  11/20/2019 amLODipine (NORVASC) 10 MG tablet Take 1 tablet (10 mg total) by mouth daily.  [name] atorvastatin (LIPITOR) 20 MG tablet Take 1 tablet (20 mg total) by mouth daily at 6 PM.  . Balsam [place]-Castor Oil (VENELEX) OINT Apply topically. Apply to sacrum and bilateral buttocks qshift for prevention.  [name] lisinopril (ZESTRIL) 10 MG tablet Take 10 mg by mouth daily.  [name] LORazepam (  ATIVAN) 0.5 MG tablet Take 1 tablet (0.5 mg total) by mouth at bedtime.  . metFORMIN (GLUCOPHAGE) 500 MG tablet Take 1 tablet by mouth 2 (two) times daily.  . NON FORMULARY Diet Change: Regular, carbohydrate consistent with thin liquids  . NON FORMULARY Accu-check qam. Notify provider of results under 60 or over 400. Once A Day  . sertraline (ZOLOFT) 100 MG tablet Take 100 mg by mouth daily.   No facility-administered encounter medications on file as of 11/18/2019.     SIGNIFICANT DIAGNOSTIC EXAMS   PREVIOUS   07-26-19: ct of head: 1. 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 2. Mild chronic small vessel ischemic disease.  07-27-19: ct of head:  1. Stable size of left thalamic and posterior limb internal capsule hemorrhage with increasing surrounding edema, as expected. 2. Increasing mass effect with partial effacement of the left lateral ventricle and 5 mm of  midline shift. 3. Stable intraventricular hemorrhage.  07-26-18: 2-d echo:  Left Ventricle: Left ventricular ejection fraction, by visual estimation, is 65 to 70%. The left ventricle has normal function. The left ventricle is not well visualized. There is moderately increased left ventricular hypertrophy. Left ventricular  diastolic parameters are consistent with Grade I diastolic dysfunction (impaired relaxation). Elevated left ventricular end-diastolic pressure.   Right Ventricle: The right ventricular size is normal. No increase in right ventricular wall thickness. Global RV systolic function is has normal systolic function. Left Atrium: Left atrial size was normal in size. Right Atrium: Right atrial size was normal in size Pericardium: There is no evidence of pericardial effusion. Mitral Valve: The mitral valve is normal in structure. No evidence of mitral valve regurgitation. No evidence of mitral valve stenosis by observation. MV peak gradient, 7.4 mmHg. Tricuspid Valve: The tricuspid valve is normal in structure. Tricuspid valve regurgitation is trivial.   Aortic Valve: The aortic valve was not well visualized. Aortic valve regurgitation is not visualized. Mild aortic valve sclerosis is present, with no evidence of aortic valve stenosis. Aortic valve mean gradient measures 6.0 mmHg. Aortic valve peak  gradient measures 11.0 mmHg. Aortic valve area, by VTI measures 2.05 cm.  07-28-19: ct of head: 1. Unchanged size and appearance of left thalamic intraparenchymal hematoma with intraventricular extension. 2. Unchanged minimal rightward midline shift.  07-28-19: swallow study: Dysphagia 1 (Puree) solids;Honey thick liquids   07-31-19: MRI/MRA of head:  Parenchymal hemorrhage involving the left thalamus and adjacent white matter with intraventricular extension. Associated mass effect is similar to recent CT. No hydrocephalus. Moderate chronic microvascular ischemic changes. Mild irregularity and  stenosis of left P1 PCA. Otherwise unremarkable MRA of the head.  NO NEW EXAMS.   LABS REVIEWED PREVIOUS  07-26-19: wbc 6.9; hgb 15.5; hct 45.8; mcv 91.4 plt 208; glucose 227; bun 15 creat 0.60; k+ 7.9; na++ 132; ca 8.9; liver normal albumin 3.2 hgb a1c 6.6 07-27-19: chol 205; ldl 120; trig 166; hdl 53 08-26-40: wbc 10.3; hgb 14.3; hct 43.5; mcv 91.8 plt 211; glucose 177; bun 21; creat 0.85; k+ 3.7; na++ 139; ca 8.4 08-01-19: wbc 9.6; hgb 14.5; ht 45.1; mcv 93.8 plt 213; glucose 131; bun 21; creat 0.67; k+ 3.7; na++ 148 ca 8.6 08-02-19: wbc 9.2; hgb 14.2; hct 44.6; mcv 95.3 plt 217; glucose 199; bun 21; creat 0.62; k+ 3.6; na++ 142; ca 8.7; mag 2.0 08-03-19: wbc 11.3; hgb 14.3; hct 44.5; mcv 93.9; plt 236; glucose 162; bun 19; creat 0.68; k+ 3.7; na++ 139; ca 8.4   NO NEW LABS.  Review of Systems  Constitutional: Negative for malaise/fatigue.  Respiratory: Negative for cough and shortness of breath.   Cardiovascular: Negative for chest pain and leg swelling.  Gastrointestinal: Negative for abdominal pain, constipation and heartburn.  Musculoskeletal: Negative for back pain, joint pain and myalgias.  Skin: Negative.   Neurological: Negative for dizziness.  Psychiatric/Behavioral: The patient is not nervous/anxious.     Physical Exam Constitutional:      General: She is not in acute distress.    Appearance: She is well-developed. She is not diaphoretic.  Neck:     Thyroid: No thyromegaly.  Cardiovascular:     Rate and Rhythm: Normal rate and regular rhythm.     Heart sounds: Normal heart sounds.  Pulmonary:     Effort: Pulmonary effort is normal. No respiratory distress.     Breath sounds: Normal breath sounds.  Abdominal:     General: Bowel sounds are normal. There is no distension.     Palpations: Abdomen is soft.     Tenderness: There is no abdominal tenderness.  Musculoskeletal:     Right lower leg: No edema.     Left lower leg: No edema.     Comments: Does not move right  extremities    Lymphadenopathy:     Cervical: No cervical adenopathy.  Skin:    General: Skin is warm and dry.  Neurological:     Mental Status: She is alert. Mental status is at baseline.  Psychiatric:        Mood and Affect: Mood normal.      ASSESSMENT/ PLAN:  TODAY  1. Mood disorder 2. Hemiplegia right side  3. IVH  Will continue current medications  Will continue current plan of care Will continue to monitor her status.    MD is aware of resident's narcotic use and is in agreement with current plan of care. We will attempt to wean resident as appropriate.  Ok Edwards NP [facility]  Contact [phone] Monday through Friday 8am- 5pm  After hours call [phone]

## 2019-12-03 DIAGNOSIS — F411 Generalized anxiety disorder: Secondary | ICD-10-CM | POA: Diagnosis not present

## 2019-12-03 DIAGNOSIS — G3184 Mild cognitive impairment, so stated: Secondary | ICD-10-CM | POA: Diagnosis not present

## 2019-12-08 ENCOUNTER — Encounter: Payer: Self-pay | Admitting: Adult Health

## 2019-12-08 ENCOUNTER — Non-Acute Institutional Stay (SKILLED_NURSING_FACILITY): Payer: Medicare Other | Admitting: Adult Health

## 2019-12-08 ENCOUNTER — Other Ambulatory Visit: Payer: Self-pay | Admitting: Adult Health

## 2019-12-08 DIAGNOSIS — I615 Nontraumatic intracerebral hemorrhage, intraventricular: Secondary | ICD-10-CM

## 2019-12-08 DIAGNOSIS — F331 Major depressive disorder, recurrent, moderate: Secondary | ICD-10-CM | POA: Diagnosis not present

## 2019-12-08 DIAGNOSIS — E1165 Type 2 diabetes mellitus with hyperglycemia: Secondary | ICD-10-CM

## 2019-12-08 DIAGNOSIS — I69351 Hemiplegia and hemiparesis following cerebral infarction affecting right dominant side: Secondary | ICD-10-CM

## 2019-12-08 MED ORDER — LORAZEPAM 0.5 MG PO TABS: 0.5000 mg | ORAL_TABLET | Freq: Every day | ORAL | 0 refills | Status: DC

## 2019-12-08 NOTE — Progress Notes (Signed)
Location:    [facility] Room Number: 114/W Place of Service:  SNF (31)   CODE STATUS: DNR  Allergies  Allergen Reactions  . Contrast Media [Iodinated Diagnostic Agents] Anaphylaxis    Chief Complaint  Patient presents with  . Medical Management of Chronic Issues          Controlled type 2 diabetes mellitus with hyperglycemia without long term current use of insulin:  Moderate episode of recurrent major depression:   IVH (intraventricular hemorrhage) hemiplegia right side:    HPI:  She is a 86 year old long term resident of this facility being seen for the management of her chronic illnesses: diabetes; depression; ivh; hemiplegia. There are no reports of uncontrolled pain; no reports of agitation or anxiety.   Past Medical History:  Diagnosis Date  . Arthritis   . Coronary artery disease    angina  . Diabetes mellitus without complication (HCC)   . Hypertension     Past Surgical History:  Procedure Laterality Date  . APPENDECTOMY    . CHOLECYSTECTOMY      Social History   Socioeconomic History  . Marital status: Widowed    Spouse name: Not on file  . Number of children: Not on file  . Years of education: Not on file  . Highest education level: Not on file  Occupational History  . Not on file  Tobacco Use  . Smoking status: Never Smoker  . Smokeless tobacco: Never Used  Substance and Sexual Activity  . Alcohol use: No  . Drug use: No  . Sexual activity: Not on file  Other Topics Concern  . Not on file  Social History Narrative  . Not on file   Social Determinants of Health   Financial Resource Strain:   . Difficulty of Paying Living Expenses:   Food Insecurity:   . Worried About Programme researcher, broadcasting/film/video in the Last Year:   . Barista in the Last Year:   Transportation Needs:   . Freight forwarder (Medical):   [name] Lack of Transportation (Non-Medical):   Physical Activity:   . Days of Exercise per Week:   . Minutes of  Exercise per Session:   Stress:   . Feeling of Stress :   Social Connections:   . Frequency of Communication with Friends and Family:   . Frequency of Social Gatherings with Friends and Family:   . Attends Religious Services:   . Active Member of Clubs or Organizations:   . Attends Banker Meetings:   [name] Marital Status:   Intimate Partner Violence:   . Fear of Current or Ex-Partner:   . Emotionally Abused:   [name] Physically Abused:   . Sexually Abused:    No family history on file.    VITAL SIGNS BP (!) 146/76   Pulse 66   Temp 98.1 F (36.7 C) (Oral)   Resp 20   Ht 5\' 4"  (1.626 m)   Wt 158 lb 9.6 oz (71.9 kg)   SpO2 96%   BMI 27.22 kg/m   Outpatient Encounter Medications as of 12/08/2019  Medication Sig  . acetaminophen (TYLENOL) 325 MG tablet Take 650 mg by mouth 2 (two) times daily as needed.  12/10/2019 amLODipine (NORVASC) 10 MG tablet Take 1 tablet (10 mg total) by mouth daily.  [name] atorvastatin (LIPITOR) 20 MG tablet Take 1 tablet (20 mg total) by mouth daily at 6 PM.  . Balsam [place]-Castor Oil (VENELEX) OINT  Apply topically. Apply to sacrum and bilateral buttocks qshift for prevention.  [name] lisinopril (ZESTRIL) 10 MG tablet Take 10 mg by mouth daily.  [name] LORazepam (ATIVAN) 0.5 MG tablet Take 1 tablet (0.5 mg total) by mouth at bedtime.  . metFORMIN (GLUCOPHAGE) 500 MG tablet Take 1 tablet by mouth 2 (two) times daily.  . NON FORMULARY Diet Change: Regular, carbohydrate consistent with thin liquids  . NON FORMULARY Accu-check qam. Notify provider of results under 60 or over 400. Once A Day  . sertraline (ZOLOFT) 100 MG tablet Take 100 mg by mouth daily.   No facility-administered encounter medications on file as of 12/08/2019.     SIGNIFICANT DIAGNOSTIC EXAMS   PREVIOUS   07-26-19: ct of head: 1. 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 2. Mild chronic small vessel ischemic disease.  07-27-19: ct of head:  1. Stable size of left thalamic and  posterior limb internal capsule hemorrhage with increasing surrounding edema, as expected. 2. Increasing mass effect with partial effacement of the left lateral ventricle and 5 mm of midline shift. 3. Stable intraventricular hemorrhage.  07-26-18: 2-d echo:  Left Ventricle: Left ventricular ejection fraction, by visual estimation, is 65 to 70%. The left ventricle has normal function. The left ventricle is not well visualized. There is moderately increased left ventricular hypertrophy. Left ventricular  diastolic parameters are consistent with Grade I diastolic dysfunction (impaired relaxation). Elevated left ventricular end-diastolic pressure.   Right Ventricle: The right ventricular size is normal. No increase in right ventricular wall thickness. Global RV systolic function is has normal systolic function. Left Atrium: Left atrial size was normal in size. Right Atrium: Right atrial size was normal in size Pericardium: There is no evidence of pericardial effusion. Mitral Valve: The mitral valve is normal in structure. No evidence of mitral valve regurgitation. No evidence of mitral valve stenosis by observation. MV peak gradient, 7.4 mmHg. Tricuspid Valve: The tricuspid valve is normal in structure. Tricuspid valve regurgitation is trivial.   Aortic Valve: The aortic valve was not well visualized. Aortic valve regurgitation is not visualized. Mild aortic valve sclerosis is present, with no evidence of aortic valve stenosis. Aortic valve mean gradient measures 6.0 mmHg. Aortic valve peak  gradient measures 11.0 mmHg. Aortic valve area, by VTI measures 2.05 cm.  07-28-19: ct of head: 1. Unchanged size and appearance of left thalamic intraparenchymal hematoma with intraventricular extension. 2. Unchanged minimal rightward midline shift.  07-28-19: swallow study: Dysphagia 1 (Puree) solids;Honey thick liquids   07-31-19: MRI/MRA of head:  Parenchymal hemorrhage involving the left thalamus and adjacent  white matter with intraventricular extension. Associated mass effect is similar to recent CT. No hydrocephalus. Moderate chronic microvascular ischemic changes. Mild irregularity and stenosis of left P1 PCA. Otherwise unremarkable MRA of the head.  NO NEW EXAMS.   LABS REVIEWED PREVIOUS  07-26-19: wbc 6.9; hgb 15.5; hct 45.8; mcv 91.4 plt 208; glucose 227; bun 15 creat 0.60; k+ 7.9; na++ 132; ca 8.9; liver normal albumin 3.2 hgb a1c 6.6 07-27-19: chol 205; ldl 120; trig 166; hdl 53 07-30-19: wbc 10.3; hgb 14.3; hct 43.5; mcv 91.8 plt 211; glucose 177; bun 21; creat 0.85; k+ 3.7; na++ 139; ca 8.4 08-01-19: wbc 9.6; hgb 14.5; ht 45.1; mcv 93.8 plt 213; glucose 131; bun 21; creat 0.67; k+ 3.7; na++ 148 ca 8.6 08-02-19: wbc 9.2; hgb 14.2; hct 44.6; mcv 95.3 plt 217; glucose 199; bun 21; creat 0.62; k+ 3.6; na++ 142; ca 8.7; mag 2.0 08-03-19: wbc 11.3;  hgb 14.3; hct 44.5; mcv 93.9; plt 236; glucose 162; bun 19; creat 0.68; k+ 3.7; na++ 139; ca 8.4   NO NEW LABS.    Review of Systems  Constitutional: Negative for malaise/fatigue.  Respiratory: Negative for cough and shortness of breath.   Cardiovascular: Negative for chest pain, palpitations and leg swelling.  Gastrointestinal: Negative for abdominal pain, constipation and heartburn.  Musculoskeletal: Negative for back pain, joint pain and myalgias.  Skin: Negative.   Neurological: Negative for dizziness.  Psychiatric/Behavioral: The patient is not nervous/anxious.     Physical Exam Constitutional:      General: She is not in acute distress.    Appearance: She is well-developed. She is not diaphoretic.  Neck:     Thyroid: No thyromegaly.  Cardiovascular:     Rate and Rhythm: Normal rate and regular rhythm.     Pulses: Normal pulses.     Heart sounds: Normal heart sounds.  Pulmonary:     Effort: Pulmonary effort is normal. No respiratory distress.     Breath sounds: Normal breath sounds.  Abdominal:     General: Bowel sounds are normal.  There is no distension.     Palpations: Abdomen is soft.     Tenderness: There is no abdominal tenderness.  Musculoskeletal:     Cervical back: Neck supple.     Right lower leg: No edema.     Left lower leg: No edema.     Comments: Does not move right side   Lymphadenopathy:     Cervical: No cervical adenopathy.  Skin:    General: Skin is warm and dry.  Neurological:     Mental Status: She is alert. Mental status is at baseline.  Psychiatric:        Mood and Affect: Mood normal.      ASSESSMENT/ PLAN:  TODAY  1. Controlled type 2 diabetes mellitus with hyperglycemia without long term current use of insulin: is stable hgb a1c 6.6; will continue metformin 500 mg twice daily   Is on ace statin not candidate for asa   2. Moderate episode of recurrent major depression: is stable will continue zoloft 100 mg daily and ativan 0.5 mg nightly   3. IVH (intraventricular hemorrhage) hemiplegia right side: is stable will monitor her status.   PREVIOUS  4. IVH (intraventriculate hemorrhage) hemiplegia right side: is stable will monitor her status.   5. Dysphagia due to recent stroke: no signs of aspiration present will continue nectar thick liquids.   6. Dyslipidemia associated with type 2 diabetes mellitis: is stable LDL 120 will continue lipitor 20 mg daily  7. Chronic constipation: is stable will continue senna s twice daily   8. Hypertension associated with type 2 diabetes mellitus is stable b/p 146/76 will continue norvasc 10 mg daily and lisinopril 10 mg daily   Will check hgb a1c urine micro albumin and DEXA scan.     MD is aware of resident's narcotic use and is in agreement with current plan of care. We will attempt to wean resident as appropriate.  Synthia Innocent NP [facility]  Contact [phone] Monday through Friday 8am- 5pm  After hours call [phone]

## 2019-12-16 ENCOUNTER — Other Ambulatory Visit (HOSPITAL_COMMUNITY)
Admission: RE | Admit: 2019-12-16 | Discharge: 2019-12-16 | Disposition: A | Discharge status: Home / Self Care | Payer: Medicare Other | Source: Skilled Nursing Facility | Attending: Adult Health | Admitting: Adult Health

## 2019-12-16 DIAGNOSIS — E1165 Type 2 diabetes mellitus with hyperglycemia: Secondary | ICD-10-CM | POA: Insufficient documentation

## 2019-12-16 LAB — HEMOGLOBIN A1C
Hgb A1c MFr Bld: 6.9 % — ABNORMAL HIGH (ref 4.8–5.6)
Mean Plasma Glucose: 151.33 mg/dL

## 2019-12-17 LAB — MICROALBUMIN, URINE: Microalb, Ur: 8.3 ug/mL — ABNORMAL HIGH

## 2019-12-23 ENCOUNTER — Other Ambulatory Visit (HOSPITAL_COMMUNITY)
Admission: RE | Admit: 2019-12-23 | Discharge: 2019-12-23 | Disposition: A | Discharge status: Home / Self Care | Payer: Medicare Other | Source: Skilled Nursing Facility | Attending: Adult Health | Admitting: Adult Health

## 2019-12-23 DIAGNOSIS — E1165 Type 2 diabetes mellitus with hyperglycemia: Secondary | ICD-10-CM | POA: Insufficient documentation

## 2019-12-23 LAB — LIPID PANEL
Cholesterol: 94 mg/dL (ref 0–200)
HDL: 42 mg/dL (ref >40)
LDL Cholesterol: 33 mg/dL (ref 0–99)
Total CHOL/HDL Ratio: 2.2 RATIO
Triglycerides: 93 mg/dL (ref <150)
VLDL: 19 mg/dL (ref 0–40)

## 2019-12-23 LAB — VITAMIN B12: Vitamin B-12: 371 pg/mL (ref 180–914)

## 2019-12-31 DIAGNOSIS — F411 Generalized anxiety disorder: Secondary | ICD-10-CM | POA: Diagnosis not present

## 2019-12-31 DIAGNOSIS — G3184 Mild cognitive impairment, so stated: Secondary | ICD-10-CM | POA: Diagnosis not present

## 2020-01-03 ENCOUNTER — Encounter: Payer: Self-pay | Admitting: Adult Health

## 2020-01-03 ENCOUNTER — Non-Acute Institutional Stay (SKILLED_NURSING_FACILITY): Payer: Medicare Other | Admitting: Adult Health

## 2020-01-03 DIAGNOSIS — E785 Hyperlipidemia, unspecified: Secondary | ICD-10-CM

## 2020-01-03 DIAGNOSIS — E1169 Type 2 diabetes mellitus with other specified complication: Secondary | ICD-10-CM | POA: Diagnosis not present

## 2020-01-03 DIAGNOSIS — K5909 Other constipation: Secondary | ICD-10-CM

## 2020-01-03 DIAGNOSIS — I69391 Dysphagia following cerebral infarction: Secondary | ICD-10-CM | POA: Diagnosis not present

## 2020-01-03 NOTE — Progress Notes (Signed)
Location:    [facility] Room Number: 114/W Place of Service:  SNF (31)   CODE STATUS: DNR  Allergies  Allergen Reactions  . Contrast Media [Iodinated Diagnostic Agents] Anaphylaxis    Chief Complaint  Patient presents with  . Medical Management of Chronic Issues        dysphagia due to cva:   Dyslipidemia associated with type 2 diabetes mellitus:  Chronic constipation:     HPI:  She is a 86 year old long term resident of this facility being seen for the management of her chronic illnesses: dysphagia; dyslipidemia; constipaitn. There are no reports of constipation; no uncontrolled pain; no changes in appetite no signs of aspiration.   Past Medical History:  Diagnosis Date  . Arthritis   . Coronary artery disease    angina  . Diabetes mellitus without complication (HCC)   . Hypertension     Past Surgical History:  Procedure Laterality Date  . APPENDECTOMY    . CHOLECYSTECTOMY      Social History   Socioeconomic History  . Marital status: Widowed    Spouse name: Not on file  . Number of children: Not on file  . Years of education: Not on file  . Highest education level: Not on file  Occupational History  . Not on file  Tobacco Use  . Smoking status: Never Smoker  . Smokeless tobacco: Never Used  Vaping Use  . Vaping Use: Never used  Substance and Sexual Activity  . Alcohol use: No  . Drug use: No  . Sexual activity: Not on file  Other Topics Concern  . Not on file  Social History Narrative  . Not on file   Social Determinants of Health   Financial Resource Strain:   . Difficulty of Paying Living Expenses:   Food Insecurity:   . Worried About Programme researcher, broadcasting/film/video in the Last Year:   . Barista in the Last Year:   Transportation Needs:   . Freight forwarder (Medical):   [name] Lack of Transportation (Non-Medical):   Physical Activity:   . Days of Exercise per Week:   . Minutes of Exercise per Session:   Stress:   .  Feeling of Stress :   Social Connections:   . Frequency of Communication with Friends and Family:   . Frequency of Social Gatherings with Friends and Family:   . Attends Religious Services:   . Active Member of Clubs or Organizations:   . Attends Banker Meetings:   [name] Marital Status:   Intimate Partner Violence:   . Fear of Current or Ex-Partner:   . Emotionally Abused:   [name] Physically Abused:   . Sexually Abused:    No family history on file.    VITAL SIGNS BP 121/66   Pulse 67   Temp 98.8 F (37.1 C) (Oral)   Resp 18   Ht 5\' 4"  (1.626 m)   Wt 159 lb 6.4 oz (72.3 kg)   SpO2 96%   BMI 27.36 kg/m   Outpatient Encounter Medications as of 01/03/2020  Medication Sig  . acetaminophen (TYLENOL) 325 MG tablet Take 650 mg by mouth 2 (two) times daily as needed.  01/05/2020 amLODipine (NORVASC) 10 MG tablet Take 1 tablet (10 mg total) by mouth daily.  [name] atorvastatin (LIPITOR) 20 MG tablet Take 1 tablet (20 mg total) by mouth daily at 6 PM.  . Balsam [place]-Castor Oil (VENELEX) OINT Apply topically. Apply  to sacrum and bilateral buttocks qshift for prevention.  [name] lisinopril (ZESTRIL) 10 MG tablet Take 10 mg by mouth daily.  [name] LORazepam (ATIVAN) 0.5 MG tablet Take 1 tablet (0.5 mg total) by mouth at bedtime.  . metFORMIN (GLUCOPHAGE) 500 MG tablet Take 1 tablet by mouth 2 (two) times daily.  . NON FORMULARY Diet Change: Regular, carbohydrate consistent with thin liquids  . NON FORMULARY Accu-check qam. Notify provider of results under 60 or over 400. Once A Day  . sertraline (ZOLOFT) 100 MG tablet Take 100 mg by mouth daily. Take along with 25 mg to = 125 mg  . sertraline (ZOLOFT) 25 MG tablet Take 25 mg by mouth daily. Take along with 100 mg to = 125 mg   No facility-administered encounter medications on file as of 01/03/2020.     SIGNIFICANT DIAGNOSTIC EXAMS    PREVIOUS   07-26-19: ct of head: 1. 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 2. Mild  chronic small vessel ischemic disease.  07-27-19: ct of head:  1. Stable size of left thalamic and posterior limb internal capsule hemorrhage with increasing surrounding edema, as expected. 2. Increasing mass effect with partial effacement of the left lateral ventricle and 5 mm of midline shift. 3. Stable intraventricular hemorrhage.  07-26-18: 2-d echo:  Left Ventricle: Left ventricular ejection fraction, by visual estimation, is 65 to 70%. The left ventricle has normal function. The left ventricle is not well visualized. There is moderately increased left ventricular hypertrophy. Left ventricular  diastolic parameters are consistent with Grade I diastolic dysfunction (impaired relaxation). Elevated left ventricular end-diastolic pressure.   Right Ventricle: The right ventricular size is normal. No increase in right ventricular wall thickness. Global RV systolic function is has normal systolic function. Left Atrium: Left atrial size was normal in size. Right Atrium: Right atrial size was normal in size Pericardium: There is no evidence of pericardial effusion. Mitral Valve: The mitral valve is normal in structure. No evidence of mitral valve regurgitation. No evidence of mitral valve stenosis by observation. MV peak gradient, 7.4 mmHg. Tricuspid Valve: The tricuspid valve is normal in structure. Tricuspid valve regurgitation is trivial.   Aortic Valve: The aortic valve was not well visualized. Aortic valve regurgitation is not visualized. Mild aortic valve sclerosis is present, with no evidence of aortic valve stenosis. Aortic valve mean gradient measures 6.0 mmHg. Aortic valve peak  gradient measures 11.0 mmHg. Aortic valve area, by VTI measures 2.05 cm.  07-28-19: ct of head: 1. Unchanged size and appearance of left thalamic intraparenchymal hematoma with intraventricular extension. 2. Unchanged minimal rightward midline shift.  07-28-19: swallow study: Dysphagia 1 (Puree) solids;Honey thick  liquids   07-31-19: MRI/MRA of head:  Parenchymal hemorrhage involving the left thalamus and adjacent white matter with intraventricular extension. Associated mass effect is similar to recent CT. No hydrocephalus. Moderate chronic microvascular ischemic changes. Mild irregularity and stenosis of left P1 PCA. Otherwise unremarkable MRA of the head.  NO NEW EXAMS.   LABS REVIEWED PREVIOUS  07-26-19: wbc 6.9; hgb 15.5; hct 45.8; mcv 91.4 plt 208; glucose 227; bun 15 creat 0.60; k+ 7.9; na++ 132; ca 8.9; liver normal albumin 3.2 hgb a1c 6.6 07-27-19: chol 205; ldl 120; trig 166; hdl 53 07-30-19: wbc 10.3; hgb 14.3; hct 43.5; mcv 91.8 plt 211; glucose 177; bun 21; creat 0.85; k+ 3.7; na++ 139; ca 8.4 08-01-19: wbc 9.6; hgb 14.5; ht 45.1; mcv 93.8 plt 213; glucose 131; bun 21; creat 0.67; k+ 3.7; na++ 148 ca  8.6 08-02-19: wbc 9.2; hgb 14.2; hct 44.6; mcv 95.3 plt 217; glucose 199; bun 21; creat 0.62; k+ 3.6; na++ 142; ca 8.7; mag 2.0 08-03-19: wbc 11.3; hgb 14.3; hct 44.5; mcv 93.9; plt 236; glucose 162; bun 19; creat 0.68; k+ 3.7; na++ 139; ca 8.4   TODAY  12-16-19: urine micro-albumin 8.3 hgb a1c 6.9 12-23-19: chol 94; ldl 33 trig 93 hdl 42 vit B 12: 371   Review of Systems  Constitutional: Negative for malaise/fatigue.  Respiratory: Negative for cough and shortness of breath.   Cardiovascular: Negative for chest pain, palpitations and leg swelling.  Gastrointestinal: Negative for abdominal pain, constipation and heartburn.  Musculoskeletal: Negative for back pain, joint pain and myalgias.  Skin: Negative.   Neurological: Negative for dizziness.  Psychiatric/Behavioral: The patient is not nervous/anxious.     Physical Exam Constitutional:      General: She is not in acute distress.    Appearance: She is well-developed. She is not diaphoretic.  Neck:     Thyroid: No thyromegaly.  Cardiovascular:     Rate and Rhythm: Normal rate and regular rhythm.     Pulses: Normal pulses.     Heart sounds:  Normal heart sounds.  Pulmonary:     Effort: Pulmonary effort is normal. No respiratory distress.     Breath sounds: Normal breath sounds.  Abdominal:     General: Bowel sounds are normal. There is no distension.     Palpations: Abdomen is soft.     Tenderness: There is no abdominal tenderness.  Musculoskeletal:     Cervical back: Neck supple.     Right lower leg: No edema.     Left lower leg: No edema.     Comments: Does not move right side   Lymphadenopathy:     Cervical: No cervical adenopathy.  Skin:    General: Skin is warm and dry.  Neurological:     Mental Status: She is alert. Mental status is at baseline.  Psychiatric:        Mood and Affect: Mood normal.       ASSESSMENT/ PLAN:  TODAY  1. dysphagia due to cva: no signs of aspiration: will continue nectar thick liquids.   2. Dyslipidemia associated with type 2 diabetes mellitus: is stable LDL 33 will continue lipitor 20 mg daily   3. Chronic constipation: is stable will continue senna s twice daily   PREVIOUS  4. Hypertension associated with type 2 diabetes mellitus is stable b/p 121/66 will continue norvasc 10 mg daily and lisinopril 10 mg daily   5. Controlled type 2 diabetes mellitus with hyperglycemia without long term current use of insulin: is stable hgb a1c 6.9; will continue metformin 500 mg twice daily   Is on ace statin not candidate for asa   6. Moderate episode of recurrent major depression: is stable will continue zoloft 125 mg daily and ativan 0.5 mg nightly   37. IVH (intraventricular hemorrhage) hemiplegia right side: is stable will monitor her status.       MD is aware of resident's narcotic use and is in agreement with current plan of care. We will attempt to wean resident as appropriate.  Synthia Innocent NP [facility]  Contact [phone] Monday through Friday 8am- 5pm  After hours call [phone]

## 2020-01-05 ENCOUNTER — Encounter: Payer: Self-pay | Admitting: Adult Health

## 2020-01-05 ENCOUNTER — Non-Acute Institutional Stay: Payer: Self-pay | Admitting: Adult Health

## 2020-01-05 DIAGNOSIS — F331 Major depressive disorder, recurrent, moderate: Secondary | ICD-10-CM

## 2020-01-05 NOTE — Progress Notes (Signed)
Location:    [facility] Room Number: 114/W Place of Service:  SNF (31)   CODE STATUS: DNR  Allergies  Allergen Reactions   Contrast Media [Iodinated Diagnostic Agents] Anaphylaxis    Chief Complaint  Patient presents with   Acute Visit    Medication Review     HPI:  We have come together for her medication review. She is ativan 0.5 mg nightly. She had her zoloft increased to 125 mg in May of this year. There are no reports of depressive thoughts or anxiety. No reports of tearfulness present no insomnia present. She is due to have a dose reduction. However at this time; she is doing well emotionally at this time and lowering her medication would cause her emotional harm.   Past Medical History:  Diagnosis Date   Arthritis    Coronary artery disease    angina   Diabetes mellitus without complication ([place])    Hypertension     Past Surgical History:  Procedure Laterality Date   APPENDECTOMY     CHOLECYSTECTOMY      Social History   Socioeconomic History   Marital status: Widowed    Spouse name: Not on file   Number of children: Not on file   Years of education: Not on file   Highest education level: Not on file  Occupational History   Not on file  Tobacco Use   Smoking status: Never Smoker   Smokeless tobacco: Never Used  Vaping Use   Vaping Use: Never used  Substance and Sexual Activity   Alcohol use: No   Drug use: No   Sexual activity: Not on file  Other Topics Concern   Not on file  Social History Narrative   Not on file   Social Determinants of Health   Financial Resource Strain:    Difficulty of Paying Living Expenses:   Food Insecurity:    Worried About [place] in the Last Year:    Arboriculturist in the Last Year:   Transportation Needs:    Film/video editor (Medical):    Lack of Transportation (Non-Medical):   Physical Activity:    Days of Exercise per Week:    Minutes of  Exercise per Session:   Stress:    Feeling of Stress :   Social Connections:    Frequency of Communication with Friends and Family:    Frequency of Social Gatherings with Friends and Family:    Attends Religious Services:    Active Member of Clubs or Organizations:    Attends Music therapist:    Marital Status:   Intimate Partner Violence:    Fear of Current or Ex-Partner:    Emotionally Abused:    Physically Abused:    Sexually Abused:    No family history on file.    VITAL SIGNS BP 121/66    Pulse 68    Temp 98.1 F (36.7 C) (Oral)    Resp 18    Ht 5\' 4"  (1.626 m)    Wt 159 lb 6.4 oz (72.3 kg)    BMI 27.36 kg/m   Outpatient Encounter Medications as of 01/05/2020  Medication Sig   acetaminophen (TYLENOL) 325 MG tablet Take 650 mg by mouth 2 (two) times daily as needed.   amLODipine (NORVASC) 10 MG tablet Take 1 tablet (10 mg total) by mouth daily.   atorvastatin (LIPITOR) 20 MG tablet Take 1 tablet (20 mg total) by mouth daily  at 6 PM.   Balsam [place]-Castor Oil (VENELEX) OINT Apply topically. Apply to sacrum and bilateral buttocks qshift for prevention.   lisinopril (ZESTRIL) 10 MG tablet Take 10 mg by mouth daily.   LORazepam (ATIVAN) 0.5 MG tablet Take 1 tablet (0.5 mg total) by mouth at bedtime.   metFORMIN (GLUCOPHAGE) 500 MG tablet Take 1 tablet by mouth 2 (two) times daily.   NON FORMULARY Diet Change: Regular, carbohydrate consistent with thin liquids   NON FORMULARY Accu-check qam. Notify provider of results under 60 or over 400. Once A Day   sertraline (ZOLOFT) 100 MG tablet Take 100 mg by mouth daily. Take along with 25 mg to = 125 mg   sertraline (ZOLOFT) 25 MG tablet Take 25 mg by mouth daily. Take along with 100 mg to = 125 mg   No facility-administered encounter medications on file as of 01/05/2020.     SIGNIFICANT DIAGNOSTIC EXAMS   PREVIOUS   07-26-19: ct of head: 1. Acute left thalamic hemorrhage with intraventricular  extension. 2. Mild chronic small vessel ischemic disease.  07-27-19: ct of head:  1. Stable size of left thalamic and posterior limb internal capsule hemorrhage with increasing surrounding edema, as expected. 2. Increasing mass effect with partial effacement of the left lateral ventricle and 5 mm of midline shift. 3. Stable intraventricular hemorrhage.  07-26-18: 2-d echo:  Left Ventricle: Left ventricular ejection fraction, by visual estimation, is 65 to 70%. The left ventricle has normal function. The left ventricle is not well visualized. There is moderately increased left ventricular hypertrophy. Left ventricular  diastolic parameters are consistent with Grade I diastolic dysfunction (impaired relaxation). Elevated left ventricular end-diastolic pressure.   Right Ventricle: The right ventricular size is normal. No increase in right ventricular wall thickness. Global RV systolic function is has normal systolic function. Left Atrium: Left atrial size was normal in size. Right Atrium: Right atrial size was normal in size Pericardium: There is no evidence of pericardial effusion. Mitral Valve: The mitral valve is normal in structure. No evidence of mitral valve regurgitation. No evidence of mitral valve stenosis by observation. MV peak gradient, 7.4 mmHg. Tricuspid Valve: The tricuspid valve is normal in structure. Tricuspid valve regurgitation is trivial.   Aortic Valve: The aortic valve was not well visualized. Aortic valve regurgitation is not visualized. Mild aortic valve sclerosis is present, with no evidence of aortic valve stenosis. Aortic valve mean gradient measures 6.0 mmHg. Aortic valve peak  gradient measures 11.0 mmHg. Aortic valve area, by VTI measures 2.05 cm.  07-28-19: ct of head: 1. Unchanged size and appearance of left thalamic intraparenchymal hematoma with intraventricular extension. 2. Unchanged minimal rightward midline shift.  07-28-19: swallow study: Dysphagia 1 (Puree)  solids;Honey thick liquids   07-31-19: MRI/MRA of head:  Parenchymal hemorrhage involving the left thalamus and adjacent white matter with intraventricular extension. Associated mass effect is similar to recent CT. No hydrocephalus. Moderate chronic microvascular ischemic changes. Mild irregularity and stenosis of left P1 PCA. Otherwise unremarkable MRA of the head.  NO NEW EXAMS.   LABS REVIEWED PREVIOUS  07-26-19: wbc 6.9; hgb 15.5; hct 45.8; mcv 91.4 plt 208; glucose 227; bun 15 creat 0.60; k+ 7.9; na++ 132; ca 8.9; liver normal albumin 3.2 hgb a1c 6.6 07-27-19: chol 205; ldl 120; trig 166; hdl 53 10-21-94: wbc 10.3; hgb 14.3; hct 43.5; mcv 91.8 plt 211; glucose 177; bun 21; creat 0.85; k+ 3.7; na++ 139; ca 8.4 08-01-19: wbc 9.6; hgb 14.5; ht 45.1; mcv 93.8 plt  213; glucose 131; bun 21; creat 0.67; k+ 3.7; na++ 148 ca 8.6 08-02-19: wbc 9.2; hgb 14.2; hct 44.6; mcv 95.3 plt 217; glucose 199; bun 21; creat 0.62; k+ 3.6; na++ 142; ca 8.7; mag 2.0 08-03-19: wbc 11.3; hgb 14.3; hct 44.5; mcv 93.9; plt 236; glucose 162; bun 19; creat 0.68; k+ 3.7; na++ 139; ca 8.4  12-16-19: urine micro-albumin 8.3 hgb a1c 6.9 12-23-19: chol 94; ldl 33 trig 93 hdl 42 vit B 12: 371   NO NEW LABS.   Review of Systems  Constitutional: Negative for malaise/fatigue.  Respiratory: Negative for cough and shortness of breath.   Cardiovascular: Negative for chest pain, palpitations and leg swelling.  Gastrointestinal: Negative for abdominal pain, constipation and heartburn.  Musculoskeletal: Negative for back pain, joint pain and myalgias.  Skin: Negative.   Neurological: Negative for dizziness.  Psychiatric/Behavioral: The patient is not nervous/anxious.     Physical Exam Constitutional:      General: She is not in acute distress.    Appearance: She is well-developed. She is not diaphoretic.  Neck:     Thyroid: No thyromegaly.  Cardiovascular:     Rate and Rhythm: Normal rate and regular rhythm.     Pulses: Normal  pulses.     Heart sounds: Normal heart sounds.  Pulmonary:     Effort: Pulmonary effort is normal. No respiratory distress.     Breath sounds: Normal breath sounds.  Abdominal:     General: Bowel sounds are normal. There is no distension.     Palpations: Abdomen is soft.     Tenderness: There is no abdominal tenderness.  Musculoskeletal:     Cervical back: Neck supple.     Right lower leg: No edema.     Left lower leg: No edema.  Lymphadenopathy:     Cervical: No cervical adenopathy.  Skin:    General: Skin is warm and dry.  Neurological:     Mental Status: She is alert. Mental status is at baseline.  Psychiatric:        Mood and Affect: Mood normal.       ASSESSMENT/ PLAN:  TODAY  1. Moderate episode of recurrent major depressive disorder  Will continue  zoloft 125 mg daily  Ativan 0.5 mg nightly  Will continue to monitor her status.      MD is aware of resident's narcotic use and is in agreement with current plan of care. We will attempt to wean resident as appropriate.  Synthia Innocent NP [facility]  Contact [phone] Monday through Friday 8am- 5pm  After hours call [phone]

## 2020-01-19 ENCOUNTER — Other Ambulatory Visit (HOSPITAL_COMMUNITY)
Admission: RE | Admit: 2020-01-19 | Discharge: 2020-01-19 | Disposition: A | Discharge status: Home / Self Care | Payer: Medicare Other | Source: Skilled Nursing Facility | Attending: Adult Health | Admitting: Adult Health

## 2020-01-19 DIAGNOSIS — I69351 Hemiplegia and hemiparesis following cerebral infarction affecting right dominant side: Secondary | ICD-10-CM | POA: Insufficient documentation

## 2020-01-19 LAB — RAPID URINE DRUG SCREEN, HOSP PERFORMED
Amphetamines: NOT DETECTED
Barbiturates: NOT DETECTED
Benzodiazepines: POSITIVE — AB
Cocaine: NOT DETECTED
Opiates: NOT DETECTED
Tetrahydrocannabinol: NOT DETECTED

## 2020-01-26 DIAGNOSIS — F4323 Adjustment disorder with mixed anxiety and depressed mood: Secondary | ICD-10-CM | POA: Diagnosis not present

## 2020-01-31 ENCOUNTER — Encounter: Payer: Self-pay | Admitting: Adult Health

## 2020-01-31 ENCOUNTER — Non-Acute Institutional Stay (SKILLED_NURSING_FACILITY): Payer: Medicare Other | Admitting: Adult Health

## 2020-01-31 DIAGNOSIS — I10 Essential (primary) hypertension: Secondary | ICD-10-CM

## 2020-01-31 DIAGNOSIS — F331 Major depressive disorder, recurrent, moderate: Secondary | ICD-10-CM

## 2020-01-31 DIAGNOSIS — J189 Pneumonia, unspecified organism: Secondary | ICD-10-CM

## 2020-01-31 DIAGNOSIS — E1165 Type 2 diabetes mellitus with hyperglycemia: Secondary | ICD-10-CM | POA: Diagnosis not present

## 2020-01-31 DIAGNOSIS — E1159 Type 2 diabetes mellitus with other circulatory complications: Secondary | ICD-10-CM | POA: Diagnosis not present

## 2020-01-31 NOTE — Progress Notes (Signed)
Location:    [facility] Room Number: 114/W Place of Service:  SNF (31)   CODE STATUS: DNR  Allergies  Allergen Reactions  . Contrast Media [Iodinated Diagnostic Agents] Anaphylaxis    Chief Complaint  Patient presents with  . Medical Management of Chronic Issues            Hypertension associated with type 2 diabetes mellitus:   Controlled type 2 diabetes mellitus with hyperglycemia without long term current use of insulin:  Moderate episode of recurrent major depression    HPI:  She is a 86 year old long term resident of this facility being seen for the management of her chronic illnesses:  Hypertension associated with type 2 diabetes mellitus:  Controlled type 2 diabetes mellitus with hyperglycemia without long term current use of insulin: Moderate episode of recurrent major depression. There are no reports of uncontrolled pain; she is complaining of cough and congestion over the past several days has tried robitussin without relief. There are no reports of fevers present.   Past Medical History:  Diagnosis Date  . Arthritis   . Coronary artery disease    angina  . Diabetes mellitus without complication (HCC)   . Hypertension     Past Surgical History:  Procedure Laterality Date  . APPENDECTOMY    . CHOLECYSTECTOMY      Social History   Socioeconomic History  . Marital status: Widowed    Spouse name: Not on file  . Number of children: Not on file  . Years of education: Not on file  . Highest education level: Not on file  Occupational History  . Not on file  Tobacco Use  . Smoking status: Never Smoker  . Smokeless tobacco: Never Used  Vaping Use  . Vaping Use: Never used  Substance and Sexual Activity  . Alcohol use: No  . Drug use: No  . Sexual activity: Not on file  Other Topics Concern  . Not on file  Social History Narrative  . Not on file   Social Determinants of Health   Financial Resource Strain:   . Difficulty of Paying  Living Expenses:   Food Insecurity:   . Worried About Programme researcher, broadcasting/film/video in the Last Year:   . Barista in the Last Year:   Transportation Needs:   . Freight forwarder (Medical):   [name] Lack of Transportation (Non-Medical):   Physical Activity:   . Days of Exercise per Week:   . Minutes of Exercise per Session:   Stress:   . Feeling of Stress :   Social Connections:   . Frequency of Communication with Friends and Family:   . Frequency of Social Gatherings with Friends and Family:   . Attends Religious Services:   . Active Member of Clubs or Organizations:   . Attends Banker Meetings:   [name] Marital Status:   Intimate Partner Violence:   . Fear of Current or Ex-Partner:   . Emotionally Abused:   [name] Physically Abused:   . Sexually Abused:    No family history on file.    VITAL SIGNS BP 118/60   Pulse 62   Temp 98.2 F (36.8 C) (Oral)   Resp 18   Ht 5\' 4"  (1.626 m)   Wt 161 lb (73 kg)   SpO2 96%   BMI 27.64 kg/m   Outpatient Encounter Medications as of 01/31/2020  Medication Sig  . acetaminophen (TYLENOL) 325 MG tablet Take  650 mg by mouth 2 (two) times daily as needed.  [name]. amLODipine (NORVASC) 10 MG tablet Take 1 tablet (10 mg total) by mouth daily.  [name]. atorvastatin (LIPITOR) 20 MG tablet Take 1 tablet (20 mg total) by mouth daily at 6 PM.  . Balsam [place]-Castor Oil (VENELEX) OINT Apply topically. Apply to sacrum and bilateral buttocks qshift for prevention.  [name]. guaiFENesin (MUCINEX) 600 MG 12 hr tablet Take 600 mg by mouth 2 (two) times daily.  [name]. levofloxacin (LEVAQUIN) 750 MG tablet Take 750 mg by mouth daily.  [name]. lisinopril (ZESTRIL) 10 MG tablet Take 10 mg by mouth daily.  [name]. LORazepam (ATIVAN) 0.5 MG tablet Take 1 tablet (0.5 mg total) by mouth at bedtime.  . metFORMIN (GLUCOPHAGE) 500 MG tablet Take 1 tablet by mouth 2 (two) times daily.  . NON FORMULARY Diet Change: Regular, carbohydrate consistent with thin liquids  . NON FORMULARY Accu-check  qam. Notify provider of results under 60 or over 400. Once A Day  . sertraline (ZOLOFT) 100 MG tablet Take 100 mg by mouth daily. Take along with 25 mg to = 125 mg  . sertraline (ZOLOFT) 25 MG tablet Take 25 mg by mouth daily. Take along with 100 mg to = 125 mg   No facility-administered encounter medications on file as of 01/31/2020.     SIGNIFICANT DIAGNOSTIC EXAMS  PREVIOUS   07-26-19: ct of head: 1. 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 2. Mild chronic small vessel ischemic disease.  07-27-19: ct of head:  1. Stable size of left thalamic and posterior limb internal capsule hemorrhage with increasing surrounding edema, as expected. 2. Increasing mass effect with partial effacement of the left lateral ventricle and 5 mm of midline shift. 3. Stable intraventricular hemorrhage.  07-26-18: 2-d echo:  Left Ventricle: Left ventricular ejection fraction, by visual estimation, is 65 to 70%. The left ventricle has normal function. The left ventricle is not well visualized. There is moderately increased left ventricular hypertrophy. Left ventricular  diastolic parameters are consistent with Grade I diastolic dysfunction (impaired relaxation). Elevated left ventricular end-diastolic pressure.   Right Ventricle: The right ventricular size is normal. No increase in right ventricular wall thickness. Global RV systolic function is has normal systolic function. Left Atrium: Left atrial size was normal in size. Right Atrium: Right atrial size was normal in size Pericardium: There is no evidence of pericardial effusion. Mitral Valve: The mitral valve is normal in structure. No evidence of mitral valve regurgitation. No evidence of mitral valve stenosis by observation. MV peak gradient, 7.4 mmHg. Tricuspid Valve: The tricuspid valve is normal in structure. Tricuspid valve regurgitation is trivial.   Aortic Valve: The aortic valve was not well visualized. Aortic valve regurgitation is  not visualized. Mild aortic valve sclerosis is present, with no evidence of aortic valve stenosis. Aortic valve mean gradient measures 6.0 mmHg. Aortic valve peak  gradient measures 11.0 mmHg. Aortic valve area, by VTI measures 2.05 cm.  07-28-19: ct of head: 1. Unchanged size and appearance of left thalamic intraparenchymal hematoma with intraventricular extension. 2. Unchanged minimal rightward midline shift.  07-28-19: swallow study: Dysphagia 1 (Puree) solids;Honey thick liquids   07-31-19: MRI/MRA of head:  Parenchymal hemorrhage involving the left thalamus and adjacent white matter with intraventricular extension. Associated mass effect is similar to recent CT. No hydrocephalus. Moderate chronic microvascular ischemic changes. Mild irregularity and stenosis of left P1 PCA. Otherwise unremarkable MRA of the head.  NO NEW EXAMS.   LABS REVIEWED PREVIOUS  07-26-19: wbc 6.9; hgb 15.5; hct 45.8; mcv 91.4 plt 208; glucose 227; bun 15 creat 0.60; k+ 7.9; na++ 132; ca 8.9; liver normal albumin 3.2 hgb a1c 6.6 07-27-19: chol 205; ldl 120; trig 166; hdl 53 07-27-08: wbc 10.3; hgb 14.3; hct 43.5; mcv 91.8 plt 211; glucose 177; bun 21; creat 0.85; k+ 3.7; na++ 139; ca 8.4 08-01-19: wbc 9.6; hgb 14.5; ht 45.1; mcv 93.8 plt 213; glucose 131; bun 21; creat 0.67; k+ 3.7; na++ 148 ca 8.6 08-02-19: wbc 9.2; hgb 14.2; hct 44.6; mcv 95.3 plt 217; glucose 199; bun 21; creat 0.62; k+ 3.6; na++ 142; ca 8.7; mag 2.0 08-03-19: wbc 11.3; hgb 14.3; hct 44.5; mcv 93.9; plt 236; glucose 162; bun 19; creat 0.68; k+ 3.7; na++ 139; ca 8.4  12-16-19: urine micro-albumin 8.3 ( on ACE)  hgb a1c 6.9 12-23-19: chol 94; ldl 33 trig 93 hdl 42 vit B 12: 371  NO NEW LABS.    Review of Systems  Constitutional: Negative for malaise/fatigue.  HENT: Positive for congestion.   Respiratory: Positive for cough. Negative for shortness of breath.   Cardiovascular: Negative for chest pain, palpitations and leg swelling.  Gastrointestinal:  Negative for abdominal pain, constipation and heartburn.  Musculoskeletal: Negative for back pain, joint pain and myalgias.  Skin: Negative.   Neurological: Negative for dizziness.  Psychiatric/Behavioral: The patient is not nervous/anxious.     Physical Exam Constitutional:      General: She is not in acute distress.    Appearance: She is well-developed. She is not diaphoretic.  Neck:     Thyroid: No thyromegaly.  Cardiovascular:     Rate and Rhythm: Normal rate and regular rhythm.     Pulses: Normal pulses.     Heart sounds: Normal heart sounds.  Pulmonary:     Effort: Pulmonary effort is normal. No respiratory distress.     Breath sounds: Rhonchi and rales present.  Abdominal:     General: Bowel sounds are normal. There is no distension.     Palpations: Abdomen is soft.     Tenderness: There is no abdominal tenderness.  Musculoskeletal:     Cervical back: Neck supple.     Right lower leg: No edema.     Left lower leg: No edema.     Comments: Does not move right side   Lymphadenopathy:     Cervical: No cervical adenopathy.  Skin:    General: Skin is warm and dry.  Neurological:     Mental Status: She is alert. Mental status is at baseline.  Psychiatric:        Mood and Affect: Mood normal.     ASSESSMENT/ PLAN:  TODAY  1. Hypertension associated with type 2 diabetes mellitus: is stable b/p 118/60 will continue norvasc 10 mg and lisinopril 10 mg daily   2. Controlled type 2 diabetes mellitus with hyperglycemia without long term current use of insulin: is stable hgb a1c 6.9 will continue metformin 500 mg twice daily is on asa statin not a candidate for asa   3. Moderate episode of recurrent major depression: is stable will continue zoloft 125 mg daily and ativan 0.5 mg nightly   4. HCAP: will begin levaquin 750 mg daily through 02-10-20; mucinex 600 mg twice daily through 02-10-20.   PREVIOUS  5. IVH (intraventricular hemorrhage) hemiplegia right side: is stable  will monitor her status.   6. dysphagia due to cva: no signs of aspiration: will continue nectar thick liquids.   7. Dyslipidemia  associated with type 2 diabetes mellitus: is stable LDL 33 will continue lipitor 20 mg daily   8. Chronic constipation: is stable will continue senna s twice daily          MD is aware of resident's narcotic use and is in agreement with current plan of care. We will attempt to wean resident as appropriate.  Synthia Innocent NP [facility]  Contact [phone] Monday through Friday 8am- 5pm  After hours call [phone]

## 2020-02-01 DIAGNOSIS — G3184 Mild cognitive impairment, so stated: Secondary | ICD-10-CM | POA: Diagnosis not present

## 2020-02-01 DIAGNOSIS — F411 Generalized anxiety disorder: Secondary | ICD-10-CM | POA: Diagnosis not present

## 2020-02-02 DIAGNOSIS — J189 Pneumonia, unspecified organism: Secondary | ICD-10-CM | POA: Insufficient documentation

## 2020-02-02 DIAGNOSIS — F4323 Adjustment disorder with mixed anxiety and depressed mood: Secondary | ICD-10-CM | POA: Diagnosis not present

## 2020-02-03 ENCOUNTER — Encounter: Payer: Self-pay | Admitting: Adult Health

## 2020-02-03 ENCOUNTER — Other Ambulatory Visit (HOSPITAL_COMMUNITY)
Admission: RE | Admit: 2020-02-03 | Discharge: 2020-02-03 | Disposition: A | Discharge status: Home / Self Care | Payer: Medicare Other | Source: Skilled Nursing Facility | Attending: Adult Health | Admitting: Adult Health

## 2020-02-03 ENCOUNTER — Non-Acute Institutional Stay (SKILLED_NURSING_FACILITY): Payer: Medicare Other | Admitting: Adult Health

## 2020-02-03 DIAGNOSIS — R937 Abnormal findings on diagnostic imaging of other parts of musculoskeletal system: Secondary | ICD-10-CM | POA: Diagnosis not present

## 2020-02-03 DIAGNOSIS — I69351 Hemiplegia and hemiparesis following cerebral infarction affecting right dominant side: Secondary | ICD-10-CM | POA: Diagnosis not present

## 2020-02-03 DIAGNOSIS — I615 Nontraumatic intracerebral hemorrhage, intraventricular: Secondary | ICD-10-CM

## 2020-02-03 DIAGNOSIS — E1165 Type 2 diabetes mellitus with hyperglycemia: Secondary | ICD-10-CM | POA: Diagnosis not present

## 2020-02-03 DIAGNOSIS — M810 Age-related osteoporosis without current pathological fracture: Secondary | ICD-10-CM | POA: Diagnosis not present

## 2020-02-03 LAB — COMPREHENSIVE METABOLIC PANEL
ALT: 29 U/L (ref 0–44)
AST: 32 U/L (ref 15–41)
Albumin: 3.1 g/dL — ABNORMAL LOW (ref 3.5–5.0)
Alkaline Phosphatase: 88 U/L (ref 38–126)
Anion gap: 7 (ref 5–15)
BUN: 11 mg/dL (ref 8–23)
CO2: 26 mmol/L (ref 22–32)
Calcium: 9 mg/dL (ref 8.9–10.3)
Chloride: 100 mmol/L (ref 98–111)
Creatinine, Ser: 0.44 mg/dL (ref 0.44–1.00)
GFR calc Af Amer: >60 mL/min (ref >60)
GFR calc non Af Amer: >60 mL/min (ref >60)
Glucose, Bld: 129 mg/dL — ABNORMAL HIGH (ref 70–99)
Potassium: 4.1 mmol/L (ref 3.5–5.1)
Sodium: 133 mmol/L — ABNORMAL LOW (ref 135–145)
Total Bilirubin: 0.7 mg/dL (ref 0.3–1.2)
Total Protein: 6.4 g/dL — ABNORMAL LOW (ref 6.5–8.1)

## 2020-02-03 LAB — CBC
HCT: 42.3 % (ref 36.0–46.0)
Hemoglobin: 13.5 g/dL (ref 12.0–15.0)
MCH: 29.9 pg (ref 26.0–34.0)
MCHC: 31.9 g/dL (ref 30.0–36.0)
MCV: 93.8 fL (ref 80.0–100.0)
Platelets: 223 10*3/uL (ref 150–400)
RBC: 4.51 MIL/uL (ref 3.87–5.11)
RDW: 13.6 % (ref 11.5–15.5)
WBC: 7.9 10*3/uL (ref 4.0–10.5)
nRBC: 0 % (ref 0.0–0.2)

## 2020-02-03 NOTE — Progress Notes (Signed)
Location:    [facility] Room Number: 114/W Place of Service:  SNF (31)   CODE STATUS: DNR  Allergies  Allergen Reactions  . Contrast Media [Iodinated Diagnostic Agents] Anaphylaxis    Chief Complaint  Patient presents with  . Acute Visit    Care PLan Meeting    HPI:  We have come together for her care plan meeting. BIMS 7/15 mood 3/30. No reports of falls; her weight is stable. She spends most of her time in her room. She is dependent upon staff for her adls. She is incontinent of bladder and bowel. There are no reports of uncontrolled pain. 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 Arthritis   . Coronary artery disease    angina  . Diabetes mellitus without complication (HCC)   . Hypertension     Past Surgical History:  Procedure Laterality Date  . APPENDECTOMY    . CHOLECYSTECTOMY      Social History   Socioeconomic History  . Marital status: Widowed    Spouse name: Not on file  . Number of children: Not on file  . Years of education: Not on file  . Highest education level: Not on file  Occupational History  . Not on file  Tobacco Use  . Smoking status: Never Smoker  . Smokeless tobacco: Never Used  Vaping Use  . Vaping Use: Never used  Substance and Sexual Activity  . Alcohol use: No  . Drug use: No  . Sexual activity: Not on file  Other Topics Concern  . Not on file  Social History Narrative  . Not on file   Social Determinants of Health   Financial Resource Strain:   . Difficulty of Paying Living Expenses:   Food Insecurity:   . Worried About Programme researcher, broadcasting/film/video in the Last Year:   . Barista in the Last Year:     Transportation Needs:   . Freight forwarder (Medical):   [name] Lack of Transportation (Non-Medical):   Physical Activity:   . Days of Exercise per Week:   . Minutes of Exercise per Session:   Stress:   . Feeling of Stress :   Social Connections:   . Frequency of Communication with Friends and Family:   . Frequency of Social Gatherings with Friends and Family:   . Attends Religious Services:   . Active Member of Clubs or Organizations:   . Attends Banker Meetings:   [name] Marital Status:   Intimate Partner Violence:   . Fear of Current or Ex-Partner:   . Emotionally Abused:   [name] Physically Abused:   . Sexually Abused:    No family history on file.    VITAL SIGNS BP 126/72   Pulse 72   Temp 98.4 F (36.9 C) (Oral)   Resp 20   Ht 5\' 4"  (1.626 m)   Wt 161 lb (73 kg)   BMI 27.64 kg/m   Outpatient Encounter Medications as of 02/03/2020  Medication Sig  . acetaminophen (TYLENOL) 325 MG tablet Take 650 mg by mouth 2 (two) times daily as needed.  02/05/2020 amLODipine (NORVASC) 10 MG tablet Take 1 tablet (10 mg total) by mouth daily.  [name]  atorvastatin (LIPITOR) 20 MG tablet Take 1 tablet (20 mg total) by mouth daily at 6 PM.  . Balsam [place]-Castor Oil (VENELEX) OINT Apply topically. Apply to sacrum and bilateral buttocks qshift for prevention.  [name] guaiFENesin (MUCINEX) 600 MG 12 hr tablet Take 600 mg by mouth 2 (two) times daily.  [name] levofloxacin (LEVAQUIN) 750 MG tablet Take 750 mg by mouth daily.  [name] lisinopril (ZESTRIL) 10 MG tablet Take 10 mg by mouth daily.  [name] LORazepam (ATIVAN) 0.5 MG tablet Take 1 tablet (0.5 mg total) by mouth at bedtime.  . metFORMIN (GLUCOPHAGE) 500 MG tablet Take 1 tablet by mouth 2 (two) times daily.  . NON FORMULARY Diet Change: Regular, carbohydrate consistent with thin liquids  . NON FORMULARY Accu-check qam. Notify provider of results under 60 or over 400. Once A Day  . sertraline (ZOLOFT) 100 MG tablet Take 100 mg by mouth daily. Take along with  25 mg to = 125 mg  . sertraline (ZOLOFT) 25 MG tablet Take 25 mg by mouth daily. Take along with 100 mg to = 125 mg   No facility-administered encounter medications on file as of 02/03/2020.     SIGNIFICANT DIAGNOSTIC EXAMS   PREVIOUS   07-26-19: ct of head: 1. 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 2. Mild chronic small vessel ischemic disease.  07-27-19: ct of head:  1. Stable size of left thalamic and posterior limb internal capsule hemorrhage with increasing surrounding edema, as expected. 2. Increasing mass effect with partial effacement of the left lateral ventricle and 5 mm of midline shift. 3. Stable intraventricular hemorrhage.  07-26-18: 2-d echo:  Left Ventricle: Left ventricular ejection fraction, by visual estimation, is 65 to 70%. The left ventricle has normal function. The left ventricle is not well visualized. There is moderately increased left ventricular hypertrophy. Left ventricular  diastolic parameters are consistent with Grade I diastolic dysfunction (impaired relaxation). Elevated left ventricular end-diastolic pressure.   Right Ventricle: The right ventricular size is normal. No increase in right ventricular wall thickness. Global RV systolic function is has normal systolic function. Left Atrium: Left atrial size was normal in size. Right Atrium: Right atrial size was normal in size Pericardium: There is no evidence of pericardial effusion. Mitral Valve: The mitral valve is normal in structure. No evidence of mitral valve regurgitation. No evidence of mitral valve stenosis by observation. MV peak gradient, 7.4 mmHg. Tricuspid Valve: The tricuspid valve is normal in structure. Tricuspid valve regurgitation is trivial.   Aortic Valve: The aortic valve was not well visualized. Aortic valve regurgitation is not visualized. Mild aortic valve sclerosis is present, with no evidence of aortic valve stenosis. Aortic valve mean gradient measures 6.0 mmHg.  Aortic valve peak  gradient measures 11.0 mmHg. Aortic valve area, by VTI measures 2.05 cm.  07-28-19: ct of head: 1. Unchanged size and appearance of left thalamic intraparenchymal hematoma with intraventricular extension. 2. Unchanged minimal rightward midline shift.  07-28-19: swallow study: Dysphagia 1 (Puree) solids;Honey thick liquids   07-31-19: MRI/MRA of head:  Parenchymal hemorrhage involving the left thalamus and adjacent white matter with intraventricular extension. Associated mass effect is similar to recent CT. No hydrocephalus. Moderate chronic microvascular ischemic changes. Mild irregularity and stenosis of left P1 PCA. Otherwise unremarkable MRA of the head.  NO NEW EXAMS.   LABS REVIEWED PREVIOUS  07-26-19: wbc 6.9; hgb 15.5; hct 45.8; mcv 91.4 plt 208; glucose 227; bun 15 creat 0.60; k+ 7.9; na++ 132; ca 8.9; liver normal albumin 3.2  hgb a1c 6.6 07-27-19: chol 205; ldl 120; trig 166; hdl 53 0-3-00: wbc 10.3; hgb 14.3; hct 43.5; mcv 91.8 plt 211; glucose 177; bun 21; creat 0.85; k+ 3.7; na++ 139; ca 8.4 08-01-19: wbc 9.6; hgb 14.5; ht 45.1; mcv 93.8 plt 213; glucose 131; bun 21; creat 0.67; k+ 3.7; na++ 148 ca 8.6 08-02-19: wbc 9.2; hgb 14.2; hct 44.6; mcv 95.3 plt 217; glucose 199; bun 21; creat 0.62; k+ 3.6; na++ 142; ca 8.7; mag 2.0 08-03-19: wbc 11.3; hgb 14.3; hct 44.5; mcv 93.9; plt 236; glucose 162; bun 19; creat 0.68; k+ 3.7; na++ 139; ca 8.4  12-16-19: urine micro-albumin 8.3 ( on ACE)  hgb a1c 6.9 12-23-19: chol 94; ldl 33 trig 93 hdl 42 vit B 12: 371  NO NEW LABS.    Review of Systems  Constitutional: Negative for malaise/fatigue.  Respiratory: Negative for cough and shortness of breath.   Cardiovascular: Negative for chest pain, palpitations and leg swelling.  Gastrointestinal: Negative for abdominal pain, constipation and heartburn.  Musculoskeletal: Negative for back pain, joint pain and myalgias.  Skin: Negative.   Neurological: Negative for dizziness.    Psychiatric/Behavioral: The patient is not nervous/anxious.    Physical Exam Constitutional:      General: She is not in acute distress.    Appearance: She is well-developed. She is not diaphoretic.  Neck:     Thyroid: No thyromegaly.  Cardiovascular:     Rate and Rhythm: Normal rate and regular rhythm.     Pulses: Normal pulses.     Heart sounds: Normal heart sounds.  Pulmonary:     Effort: Pulmonary effort is normal. No respiratory distress.     Breath sounds: Normal breath sounds.  Abdominal:     General: Bowel sounds are normal. There is no distension.     Palpations: Abdomen is soft.     Tenderness: There is no abdominal tenderness.  Musculoskeletal:     Cervical back: Neck supple.     Right lower leg: No edema.     Left lower leg: No edema.     Comments: Right hemiplegia   Lymphadenopathy:     Cervical: No cervical adenopathy.  Skin:    General: Skin is warm and dry.  Neurological:     Mental Status: She is alert. Mental status is at baseline.  Psychiatric:        Mood and Affect: Mood normal.       ASSESSMENT/ PLAN:  TODAY  1. 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  IVH (intraventricular hemorrhage) 3. Controlled type 2 diabetes mellitus with hyperglycemia without current long term use of insulin  Will continue current medications Will continue current plan of care Will continue to monitor her status.   MD is aware of resident's narcotic use and is in agreement with current plan of care. We will attempt to wean resident as appropriate.  Synthia Innocent NP [facility]  Contact [phone] Monday through Friday 8am- 5pm  After hours call [phone]

## 2020-02-04 ENCOUNTER — Other Ambulatory Visit: Payer: Self-pay | Admitting: Adult Health

## 2020-02-04 MED ORDER — LORAZEPAM 0.5 MG PO TABS: 0.5000 mg | ORAL_TABLET | Freq: Every day | ORAL | 0 refills | Status: DC

## 2020-02-09 DIAGNOSIS — F4323 Adjustment disorder with mixed anxiety and depressed mood: Secondary | ICD-10-CM | POA: Diagnosis not present

## 2020-02-15 DIAGNOSIS — I517 Cardiomegaly: Secondary | ICD-10-CM | POA: Diagnosis not present

## 2020-02-16 DIAGNOSIS — F4323 Adjustment disorder with mixed anxiety and depressed mood: Secondary | ICD-10-CM | POA: Diagnosis not present

## 2020-02-23 DIAGNOSIS — F4323 Adjustment disorder with mixed anxiety and depressed mood: Secondary | ICD-10-CM | POA: Diagnosis not present

## 2020-02-23 DIAGNOSIS — H25813 Combined forms of age-related cataract, bilateral: Secondary | ICD-10-CM | POA: Diagnosis not present

## 2020-02-23 DIAGNOSIS — E119 Type 2 diabetes mellitus without complications: Secondary | ICD-10-CM | POA: Diagnosis not present

## 2020-02-28 ENCOUNTER — Other Ambulatory Visit: Payer: Self-pay | Admitting: Adult Health

## 2020-02-28 MED ORDER — LORAZEPAM 0.5 MG PO TABS: 0.5000 mg | ORAL_TABLET | Freq: Every day | ORAL | 0 refills | Status: DC

## 2020-03-03 ENCOUNTER — Encounter: Payer: Self-pay | Admitting: Adult Health

## 2020-03-03 ENCOUNTER — Non-Acute Institutional Stay (SKILLED_NURSING_FACILITY): Payer: Medicare Other | Admitting: Adult Health

## 2020-03-03 DIAGNOSIS — I69391 Dysphagia following cerebral infarction: Secondary | ICD-10-CM

## 2020-03-03 DIAGNOSIS — I615 Nontraumatic intracerebral hemorrhage, intraventricular: Secondary | ICD-10-CM | POA: Diagnosis not present

## 2020-03-03 DIAGNOSIS — I69351 Hemiplegia and hemiparesis following cerebral infarction affecting right dominant side: Secondary | ICD-10-CM | POA: Diagnosis not present

## 2020-03-03 DIAGNOSIS — E1169 Type 2 diabetes mellitus with other specified complication: Secondary | ICD-10-CM

## 2020-03-03 DIAGNOSIS — E785 Hyperlipidemia, unspecified: Secondary | ICD-10-CM

## 2020-03-03 NOTE — Progress Notes (Signed)
Location:    [facility] Room Number: 114/W Place of Service:  SNF (31)   CODE STATUS: DNR  Allergies  Allergen Reactions  . Contrast Media [Iodinated Diagnostic Agents] Anaphylaxis    Chief Complaint  Patient presents with  . Medical Management of Chronic Issues          IVH (intraventricular hemorrhage) hemiplegia right side:  Dysphagia due to cva:    Dyslipidemia associated with type 2 diabetes mellitus    HPI:  She is a 86 year old long term resident of this facility being seen for the management of her chronic illnesses:  IVH (intraventricular hemorrhage) hemiplegia right side:  Dysphagia due to cva: Dyslipidemia associated with type 2 diabetes mellitus. There are no reports of uncontrolled pain; she is not coughing does not have any shortness of breath. No indications of aspiration present.   Past Medical History:  Diagnosis Date  . Arthritis   . Coronary artery disease    angina  . Diabetes mellitus without complication (HCC)   . Hypertension     Past Surgical History:  Procedure Laterality Date  . APPENDECTOMY    . CHOLECYSTECTOMY      Social History   Socioeconomic History  . Marital status: Widowed    Spouse name: Not on file  . Number of children: Not on file  . Years of education: Not on file  . Highest education level: Not on file  Occupational History  . Not on file  Tobacco Use  . Smoking status: Never Smoker  . Smokeless tobacco: Never Used  Vaping Use  . Vaping Use: Never used  Substance and Sexual Activity  . Alcohol use: No  . Drug use: No  . Sexual activity: Not on file  Other Topics Concern  . Not on file  Social History Narrative  . Not on file   Social Determinants of Health   Financial Resource Strain:   . Difficulty of Paying Living Expenses:   Food Insecurity:   . Worried About Programme researcher, broadcasting/film/video in the Last Year:   . Barista in the Last Year:   Transportation Needs:   . Automotive engineer (Medical):   [name] Lack of Transportation (Non-Medical):   Physical Activity:   . Days of Exercise per Week:   . Minutes of Exercise per Session:   Stress:   . Feeling of Stress :   Social Connections:   . Frequency of Communication with Friends and Family:   . Frequency of Social Gatherings with Friends and Family:   . Attends Religious Services:   . Active Member of Clubs or Organizations:   . Attends Banker Meetings:   [name] Marital Status:   Intimate Partner Violence:   . Fear of Current or Ex-Partner:   . Emotionally Abused:   [name] Physically Abused:   . Sexually Abused:    No family history on file.    VITAL SIGNS BP 134/76   Pulse 69   Temp 98.4 F (36.9 C) (Oral)   Resp 18   Ht 5\' 4"  (1.626 m)   Wt 160 lb 3.2 oz (72.7 kg)   BMI 27.50 kg/m   Outpatient Encounter Medications as of 03/03/2020  Medication Sig  . acetaminophen (TYLENOL) 325 MG tablet Take 650 mg by mouth 2 (two) times daily as needed.  03/05/2020 amLODipine (NORVASC) 10 MG tablet Take 1 tablet (10 mg total) by mouth daily.  [name] atorvastatin (LIPITOR) 20  MG tablet Take 1 tablet (20 mg total) by mouth daily at 6 PM.  . Balsam [place]-Castor Oil (VENELEX) OINT Apply topically. Apply to sacrum and bilateral buttocks qshift for prevention.  [name] lisinopril (ZESTRIL) 10 MG tablet Take 10 mg by mouth daily.  [name] LORazepam (ATIVAN) 0.5 MG tablet Take 1 tablet (0.5 mg total) by mouth at bedtime.  . metFORMIN (GLUCOPHAGE) 500 MG tablet Take 1 tablet by mouth 2 (two) times daily.  . NON FORMULARY Diet Change: Regular, carbohydrate consistent with thin liquids  . NON FORMULARY Accu-check qam. Notify provider of results under 60 or over 400. Once A Day  . sertraline (ZOLOFT) 100 MG tablet Take 100 mg by mouth daily. Take along with 25 mg to = 125 mg  . sertraline (ZOLOFT) 25 MG tablet Take 25 mg by mouth daily. Take along with 100 mg to = 125 mg   No facility-administered encounter medications on file as of  03/03/2020.     SIGNIFICANT DIAGNOSTIC EXAMS  PREVIOUS   07-26-19: ct of head: 1. 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 2. Mild chronic small vessel ischemic disease.  07-27-19: ct of head:  1. Stable size of left thalamic and posterior limb internal capsule hemorrhage with increasing surrounding edema, as expected. 2. Increasing mass effect with partial effacement of the left lateral ventricle and 5 mm of midline shift. 3. Stable intraventricular hemorrhage.  07-26-18: 2-d echo:  Left Ventricle: Left ventricular ejection fraction, by visual estimation, is 65 to 70%. The left ventricle has normal function. The left ventricle is not well visualized. There is moderately increased left ventricular hypertrophy. Left ventricular  diastolic parameters are consistent with Grade I diastolic dysfunction (impaired relaxation). Elevated left ventricular end-diastolic pressure.   Right Ventricle: The right ventricular size is normal. No increase in right ventricular wall thickness. Global RV systolic function is has normal systolic function. Left Atrium: Left atrial size was normal in size. Right Atrium: Right atrial size was normal in size Pericardium: There is no evidence of pericardial effusion. Mitral Valve: The mitral valve is normal in structure. No evidence of mitral valve regurgitation. No evidence of mitral valve stenosis by observation. MV peak gradient, 7.4 mmHg. Tricuspid Valve: The tricuspid valve is normal in structure. Tricuspid valve regurgitation is trivial.   Aortic Valve: The aortic valve was not well visualized. Aortic valve regurgitation is not visualized. Mild aortic valve sclerosis is present, with no evidence of aortic valve stenosis. Aortic valve mean gradient measures 6.0 mmHg. Aortic valve peak  gradient measures 11.0 mmHg. Aortic valve area, by VTI measures 2.05 cm.  07-28-19: ct of head: 1. Unchanged size and appearance of left thalamic intraparenchymal  hematoma with intraventricular extension. 2. Unchanged minimal rightward midline shift.  07-28-19: swallow study: Dysphagia 1 (Puree) solids;Honey thick liquids   07-31-19: MRI/MRA of head:  Parenchymal hemorrhage involving the left thalamus and adjacent white matter with intraventricular extension. Associated mass effect is similar to recent CT. No hydrocephalus. Moderate chronic microvascular ischemic changes. Mild irregularity and stenosis of left P1 PCA. Otherwise unremarkable MRA of the head.  TODAY  02-03-20: DEXA t score -1.009  LABS REVIEWED PREVIOUS  07-26-19: wbc 6.9; hgb 15.5; hct 45.8; mcv 91.4 plt 208; glucose 227; bun 15 creat 0.60; k+ 7.9; na++ 132; ca 8.9; liver normal albumin 3.2 hgb a1c 6.6 07-27-19: chol 205; ldl 120; trig 166; hdl 53 01-25-27: wbc 10.3; hgb 14.3; hct 43.5; mcv 91.8 plt 211; glucose 177; bun 21; creat 0.85; k+ 3.7; na++  139; ca 8.4 08-01-19: wbc 9.6; hgb 14.5; ht 45.1; mcv 93.8 plt 213; glucose 131; bun 21; creat 0.67; k+ 3.7; na++ 148 ca 8.6 08-02-19: wbc 9.2; hgb 14.2; hct 44.6; mcv 95.3 plt 217; glucose 199; bun 21; creat 0.62; k+ 3.6; na++ 142; ca 8.7; mag 2.0 08-03-19: wbc 11.3; hgb 14.3; hct 44.5; mcv 93.9; plt 236; glucose 162; bun 19; creat 0.68; k+ 3.7; na++ 139; ca 8.4  12-16-19: urine micro-albumin 8.3 ( on ACE)  hgb a1c 6.9 12-23-19: chol 94; ldl 33 trig 93 hdl 42 vit B 12: 371  TODAY.  02-03-20: wbc 7.9; hgb 13.5; hct 42.3; mcv 93.8 plt 223; glucose  129; bun 11; creat 0.44; k+ 4.1; na++ 133 ca 9.0 liver normal albumin 3.1   Review of Systems  Constitutional: Negative for malaise/fatigue.  Respiratory: Negative for cough and shortness of breath.   Cardiovascular: Negative for chest pain, palpitations and leg swelling.  Gastrointestinal: Negative for abdominal pain, constipation and heartburn.  Musculoskeletal: Negative for back pain, joint pain and myalgias.  Skin: Negative.   Neurological: Negative for dizziness.  Psychiatric/Behavioral: The  patient is not nervous/anxious.     Physical Exam Constitutional:      General: She is not in acute distress.    Appearance: She is well-developed. She is not diaphoretic.  Neck:     Thyroid: No thyromegaly.  Cardiovascular:     Rate and Rhythm: Normal rate and regular rhythm.     Heart sounds: Normal heart sounds.  Pulmonary:     Effort: Pulmonary effort is normal. No respiratory distress.     Breath sounds: Normal breath sounds.  Abdominal:     General: Bowel sounds are normal. There is no distension.     Palpations: Abdomen is soft.     Tenderness: There is no abdominal tenderness.  Musculoskeletal:     Cervical back: Neck supple.     Right lower leg: No edema.     Left lower leg: No edema.     Comments: Right hemiplegia   Lymphadenopathy:     Cervical: No cervical adenopathy.  Skin:    General: Skin is warm and dry.  Neurological:     Mental Status: She is alert. Mental status is at baseline.  Psychiatric:        Mood and Affect: Mood normal.      ASSESSMENT/ PLAN:  TODAY  1. IVH (intraventricular hemorrhage) hemiplegia right side: is stable will monitor   2. Dysphagia due to cva: stable no signs of aspiration will continue nectar thick liquids.   3. Dyslipidemia associated with type 2 diabetes mellitus is stable LDL 33 will continue lipitor 20 mg daily   PREVIOUS  4. Chronic constipation: is stable will continue senna s twice daily   5. Hypertension associated with type 2 diabetes mellitus: is stable b/p 134/76 will continue norvasc 10 mg and lisinopril 10 mg daily   6. Controlled type 2 diabetes mellitus with hyperglycemia without long term current use of insulin: is stable hgb a1c 6.9 will continue metformin 500 mg twice daily is on ace statin not a candidate for asa   7. Moderate episode of recurrent major depression: is stable will continue zoloft 125 mg daily and ativan 0.5 mg nightly          MD is aware of resident's narcotic use and is in  agreement with current plan of care. We will attempt to wean resident as appropriate.  Synthia Innocent NP [facility]  Contact [phone] Monday through Friday 8am- 5pm  After hours call [phone]

## 2020-03-08 DIAGNOSIS — F4323 Adjustment disorder with mixed anxiety and depressed mood: Secondary | ICD-10-CM | POA: Diagnosis not present

## 2020-03-09 DIAGNOSIS — I610 Nontraumatic intracerebral hemorrhage in hemisphere, subcortical: Secondary | ICD-10-CM | POA: Diagnosis not present

## 2020-03-09 DIAGNOSIS — Z1159 Encounter for screening for other viral diseases: Secondary | ICD-10-CM | POA: Diagnosis not present

## 2020-03-09 DIAGNOSIS — I69351 Hemiplegia and hemiparesis following cerebral infarction affecting right dominant side: Secondary | ICD-10-CM | POA: Diagnosis not present

## 2020-03-13 DIAGNOSIS — F411 Generalized anxiety disorder: Secondary | ICD-10-CM | POA: Diagnosis not present

## 2020-03-13 DIAGNOSIS — G3184 Mild cognitive impairment, so stated: Secondary | ICD-10-CM | POA: Diagnosis not present

## 2020-03-13 DIAGNOSIS — Z1159 Encounter for screening for other viral diseases: Secondary | ICD-10-CM | POA: Diagnosis not present

## 2020-03-13 DIAGNOSIS — I69351 Hemiplegia and hemiparesis following cerebral infarction affecting right dominant side: Secondary | ICD-10-CM | POA: Diagnosis not present

## 2020-03-13 DIAGNOSIS — I610 Nontraumatic intracerebral hemorrhage in hemisphere, subcortical: Secondary | ICD-10-CM | POA: Diagnosis not present

## 2020-03-14 ENCOUNTER — Inpatient Hospital Stay
Admission: RE | Admit: 2020-03-14 | Discharge: 2022-08-12 | Disposition: A | Discharge status: Skilled Nursing Facility | Payer: Medicare Other | Source: Ambulatory Visit | Attending: Internal Medicine | Admitting: Internal Medicine

## 2020-03-14 ENCOUNTER — Emergency Department (HOSPITAL_COMMUNITY): Payer: Medicare Other

## 2020-03-14 ENCOUNTER — Emergency Department (HOSPITAL_COMMUNITY)
Admission: EM | Admit: 2020-03-14 | Discharge: 2020-03-14 | Disposition: A | Discharge status: Home / Self Care | Payer: Medicare Other | Attending: Emergency Medicine | Admitting: Emergency Medicine

## 2020-03-14 ENCOUNTER — Other Ambulatory Visit: Payer: Self-pay

## 2020-03-14 ENCOUNTER — Encounter (HOSPITAL_COMMUNITY): Payer: Self-pay

## 2020-03-14 DIAGNOSIS — I10 Essential (primary) hypertension: Secondary | ICD-10-CM | POA: Insufficient documentation

## 2020-03-14 DIAGNOSIS — S0101XA Laceration without foreign body of scalp, initial encounter: Secondary | ICD-10-CM | POA: Insufficient documentation

## 2020-03-14 DIAGNOSIS — I2510 Atherosclerotic heart disease of native coronary artery without angina pectoris: Secondary | ICD-10-CM | POA: Diagnosis not present

## 2020-03-14 DIAGNOSIS — Y929 Unspecified place or not applicable: Secondary | ICD-10-CM | POA: Insufficient documentation

## 2020-03-14 DIAGNOSIS — S0990XA Unspecified injury of head, initial encounter: Secondary | ICD-10-CM | POA: Insufficient documentation

## 2020-03-14 DIAGNOSIS — M79601 Pain in right arm: Secondary | ICD-10-CM

## 2020-03-14 DIAGNOSIS — W01198A Fall on same level from slipping, tripping and stumbling with subsequent striking against other object, initial encounter: Secondary | ICD-10-CM | POA: Insufficient documentation

## 2020-03-14 DIAGNOSIS — Z7984 Long term (current) use of oral hypoglycemic drugs: Secondary | ICD-10-CM | POA: Diagnosis not present

## 2020-03-14 DIAGNOSIS — M47812 Spondylosis without myelopathy or radiculopathy, cervical region: Secondary | ICD-10-CM | POA: Diagnosis not present

## 2020-03-14 DIAGNOSIS — M4802 Spinal stenosis, cervical region: Secondary | ICD-10-CM | POA: Diagnosis not present

## 2020-03-14 DIAGNOSIS — Z79899 Other long term (current) drug therapy: Secondary | ICD-10-CM | POA: Diagnosis not present

## 2020-03-14 DIAGNOSIS — Y939 Activity, unspecified: Secondary | ICD-10-CM | POA: Insufficient documentation

## 2020-03-14 DIAGNOSIS — Y999 Unspecified external cause status: Secondary | ICD-10-CM | POA: Insufficient documentation

## 2020-03-14 DIAGNOSIS — S199XXA Unspecified injury of neck, initial encounter: Secondary | ICD-10-CM | POA: Diagnosis not present

## 2020-03-14 DIAGNOSIS — I6389 Other cerebral infarction: Secondary | ICD-10-CM | POA: Diagnosis not present

## 2020-03-14 DIAGNOSIS — W19XXXA Unspecified fall, initial encounter: Secondary | ICD-10-CM

## 2020-03-14 DIAGNOSIS — G9389 Other specified disorders of brain: Secondary | ICD-10-CM | POA: Diagnosis not present

## 2020-03-14 DIAGNOSIS — R52 Pain, unspecified: Principal | ICD-10-CM

## 2020-03-14 MED ORDER — LIDOCAINE-EPINEPHRINE-TETRACAINE (LET) TOPICAL GEL
3.0000 mL | Freq: Once | TOPICAL | Status: AC
  Administered 2020-03-14: 3 mL via TOPICAL
  Filled 2020-03-14: qty 3

## 2020-03-14 MED ORDER — ACETAMINOPHEN 500 MG PO TABS
1000.0000 mg | ORAL_TABLET | Freq: Once | ORAL | Status: AC
  Administered 2020-03-14: 1000 mg via ORAL
  Filled 2020-03-14: qty 2

## 2020-03-14 NOTE — Discharge Instructions (Addendum)
Keep your hair and scalp dry for the next 2 days, after which you may wash normally, but avoid disturbing the site of the staples.  Have them removed in 10 days.  Return here for this if your MD is unable to remove them.  Your CT scan does not show any sign of internal injury from todays fall.  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 This does not need further evaluation unless you are experiencing NEW weakness or numbness in your arms or neck pain.

## 2020-03-14 NOTE — ED Provider Notes (Signed)
[facility] EMERGENCY DEPARTMENT Provider Note   CSN: [id number] Arrival date & time: 03/14/20  1425     History Chief Complaint  Patient presents with  . Fall    [name] [name] is a 86 y.o. 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  She was at the [facility] nursing center when she fell from a lift and struck her head on the brakes of the stretcher, causing scalp injury.  She denies having any LOC, also denies headache, neck or back pain, denies any injury to extremities.  She is not on any blood thinning medications.  The history is provided by the patient and the nursing home.       Past Medical History:  Diagnosis Date  . Arthritis   . Coronary artery disease    angina  . Diabetes mellitus without complication (HCC)   . Hypertension     Patient Active Problem List   Diagnosis Date Noted  . HCAP (healthcare-associated pneumonia) 02/02/2020  . Medicare annual wellness visit, subsequent 10/19/2019  . Weight loss 09/07/2019  . Major depression, recurrent (HCC) 09/07/2019  . Aphasia due to acute stroke (HCC) 08/15/2019  . 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 Dyslipidemia associated with type 2 diabetes mellitus (HCC) 08/04/2019  . Chronic constipation 08/04/2019  . Dysphagia due to recent cerebrovascular accident (CVA) 08/04/2019  . Hemiplegia affecting right dominant side (HCC) 08/04/2019  . Hypertension associated with type 2 diabetes mellitus (HCC) 07/31/2019  . Hyperkalemia   . Controlled type 2 diabetes mellitus with hyperglycemia, without long-term current use of insulin (HCC)   . Hyponatremia   . 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 Brain herniation (HCC) 07/27/2019  . IVH (intraventricular hemorrhage) (HCC) 07/26/2019    Past Surgical History:  Procedure Laterality Date  . APPENDECTOMY    . CHOLECYSTECTOMY       OB History    No obstetric history on file.     No family history on file.  Social History   Tobacco Use  . Smoking status: Never Smoker  . Smokeless tobacco: Never Used  Vaping Use  . Vaping Use: Never used  Substance Use Topics  . Alcohol use: No  . Drug use: No    Home Medications Prior to Admission medications   Medication Sig Start Date End Date Taking? Authorizing Provider  acetaminophen (TYLENOL) 325 MG tablet Take 650 mg by mouth 2 (two) times daily as needed.    [provider]  amLODipine (NORVASC) 10 MG tablet Take 1 tablet (10 mg total) by mouth daily. 08/03/19   Margie Ege A, DO  atorvastatin (LIPITOR) 20 MG tablet Take 1 tablet (20 mg total) by mouth daily at 6 PM. 08/03/19   Ronaldo Miyamoto, Ferne Reus, DO  Balsam [place]-Castor Oil [place]) OINT Apply topically. Apply to sacrum and bilateral buttocks qshift for prevention.    [provider]  lisinopril (ZESTRIL) 10 MG tablet Take 10 mg by mouth daily. 07/12/19   [provider]  LORazepam (ATIVAN) 0.5 MG tablet Take 1 tablet (0.5 mg total) by mouth at bedtime. 02/28/20   Sharee Holster, NP  metFORMIN (GLUCOPHAGE) 500 MG tablet Take 1 tablet by mouth 2 (two) times daily. 11/07/14   [provider]  NON FORMULARY Diet Change: Regular, carbohydrate consistent with thin liquids    [provider]  NON FORMULARY Accu-check qam. Notify provider of results  under 60 or over 400. Once A Day    [provider]  sertraline (ZOLOFT) 100 MG tablet Take 100 mg by mouth daily. Take along with 25 mg to = 125 mg 12/09/19   [provider]  sertraline (ZOLOFT) 25 MG tablet Take 25 mg by mouth daily. Take along with 100 mg to = 125 mg 12/09/19   [provider]    Allergies    Contrast media [iodinated diagnostic agents]  Review of Systems   Review of Systems  Constitutional: Negative for fever.  HENT: Negative for congestion and sore throat.   Eyes: Negative.   Respiratory: Negative  for chest tightness and shortness of breath.   Cardiovascular: Negative for chest pain.  Gastrointestinal: Negative for abdominal pain, nausea and vomiting.  Genitourinary: Negative.   Musculoskeletal: Negative for arthralgias, joint swelling and neck pain.  Skin: Positive for wound. Negative for rash.  Neurological: Positive for weakness and headaches. Negative for dizziness, light-headedness and numbness.       Chronic right sided weakness.  Psychiatric/Behavioral: Negative.   All other systems reviewed and are negative.   Physical Exam Updated Vital Signs BP 131/64 (BP Location: Right Arm)   Pulse 79   Temp 98.4 F (36.9 C) (Oral)   Resp 17   Ht 5\' 4"  (1.626 m)   Wt 72 kg   SpO2 98%   BMI 27.25 kg/m   Physical Exam Vitals and nursing note reviewed.  Constitutional:      Appearance: She is well-developed.  HENT:     Head: Normocephalic.     Comments: 2 cm laceration right parietal scalp.  Hemostatic.  Small surrounding hematoma. No palpable skull deformity/depression.  Eyes:     Conjunctiva/sclera: Conjunctivae normal.  Cardiovascular:     Rate and Rhythm: Normal rate and regular rhythm.     Heart sounds: Normal heart sounds.  Pulmonary:     Effort: Pulmonary effort is normal.     Breath sounds: Normal breath sounds. No wheezing.  Abdominal:     General: Bowel sounds are normal. There is no distension.     Palpations: Abdomen is soft.     Tenderness: There is no abdominal tenderness. There is no guarding.  Musculoskeletal:        General: Normal range of motion.     Cervical back: Normal range of motion. No tenderness.  Skin:    General: Skin is warm and dry.  Neurological:     Mental Status: She is alert and oriented to person, place, and time.     Motor: Weakness present.     Comments: Chronic right hemiplegia     ED Results / Procedures / Treatments   Labs (all labs ordered are listed, but only abnormal results are displayed) Labs Reviewed - No data to  display  EKG None  Radiology CT Head Wo Contrast  Result Date: 03/14/2020 CLINICAL DATA:  Fall, head injury EXAM: CT HEAD WITHOUT CONTRAST CT CERVICAL SPINE WITHOUT CONTRAST TECHNIQUE: Multidetector CT imaging of the head and cervical spine was performed following the standard protocol without intravenous contrast. Multiplanar CT image reconstructions of the cervical spine were also generated. COMPARISON:  CT head 07/28/2019 FINDINGS: CT HEAD FINDINGS Brain: Previously noted intraparenchymal hematoma within the left basal ganglia and intraventricular hemorrhage has resolved. There is residual encephalomalacia within the posterior limb of the left internal capsule and left thalamus. Remote lacunar infarct noted within the genu of the left internal capsule. Tiny remote lacunar infarcts noted within  the cerebellar hemispheres bilaterally. Moderate periventricular white matter changes are present likely reflecting the sequela of small vessel ischemia. No abnormal intra or extra-axial mass lesion or fluid collection. No abnormal mass effect or midline shift. No evidence of acute intracranial hemorrhage or infarct. Ventricular size is normal. Cerebellum unremarkable. Vascular: No asymmetric hyperdense vasculature. Skull: Intact Sinuses/Orbits: Paranasal sinuses are clear. Orbits are unremarkable. Other: Mastoid air cells and middle ear cavities are clear. Small soft tissue laceration at the right parietal scalp at the vertex with associated small probable subcutaneous hematoma and small right parietal scalp hematoma. CT CERVICAL SPINE FINDINGS Alignment: There is straightening of the cervical spine. No listhesis. Skull base and vertebrae: The craniocervical junction is unremarkable. The atlantodental interval is normal. No acute cervical spine fracture. No lytic or blastic bone lesion. Soft tissues and spinal canal: No prevertebral fluid or swelling. No visible canal hematoma. Disc levels: Review of the sagittal  reformats demonstrates preservation of vertebral body height. There is diffuse intervertebral disc space narrowing and endplate remodeling throughout the cervical spine in keeping with changes of diffuse moderate to severe degenerative disc disease. Review of the axial images demonstrates a posterior disc osteophyte complex at C3-4, C4-5, and C5-6 resulting in mild-to-moderate central canal stenosis with flattening of the thecal sac, most severe at C4-5 where large herniated disc material is identified. The AP diameter of the spinal canal in this region is approximately 5-6 mm. Multilevel uncovertebral and facet arthrosis results in multilevel mild neural foraminal narrowing, most severe on the left at C6-7 Upper chest: Unremarkable Other: None significant IMPRESSION: 1. No evidence of acute intracranial process. 2. No acute cervical spine fracture or listhesis. 3. Small soft tissue laceration at the right parietal scalp at the vertex with associated small subcutaneous hematoma and small right parietal scalp hematoma. 4. Moderate to severe multilevel degenerative disc disease of the cervical spine, most severe at C4-5 where there is a large herniated disc material. The AP diameter of the spinal canal in this region is approximately 5-6 mm. Multilevel mild neural foraminal narrowing, most severe on the left at C6-7 where there is also uncovertebral and facet arthrosis. 5. Moderate periventricular white matter changes likely reflecting the sequela of small vessel ischemia. Previously noted intraparenchymal hematoma within the left basal ganglia and intraventricular hemorrhage has resolved. There is residual encephalomalacia within the posterior limb of the left internal capsule and left thalamus. Electronically Signed   By: Helyn Numbers MD   On: 03/14/2020 19:28   CT Cervical Spine Wo Contrast  Result Date: 03/14/2020 CLINICAL DATA:  Fall, head injury EXAM: CT HEAD WITHOUT CONTRAST CT CERVICAL SPINE WITHOUT  CONTRAST TECHNIQUE: Multidetector CT imaging of the head and cervical spine was performed following the standard protocol without intravenous contrast. Multiplanar CT image reconstructions of the cervical spine were also generated. COMPARISON:  CT head 07/28/2019 FINDINGS: CT HEAD FINDINGS Brain: Previously noted intraparenchymal hematoma within the left basal ganglia and intraventricular hemorrhage has resolved. There is residual encephalomalacia within the posterior limb of the left internal capsule and left thalamus. Remote lacunar infarct noted within the genu of the left internal capsule. Tiny remote lacunar infarcts noted within the cerebellar hemispheres bilaterally. Moderate periventricular white matter changes are present likely reflecting the sequela of small vessel ischemia. No abnormal intra or extra-axial mass lesion or fluid collection. No abnormal mass effect or midline shift. No evidence of acute intracranial hemorrhage or infarct. Ventricular size is normal. Cerebellum unremarkable. Vascular: No asymmetric hyperdense vasculature. Skull: Intact Sinuses/Orbits:  Paranasal sinuses are clear. Orbits are unremarkable. Other: Mastoid air cells and middle ear cavities are clear. Small soft tissue laceration at the right parietal scalp at the vertex with associated small probable subcutaneous hematoma and small right parietal scalp hematoma. CT CERVICAL SPINE FINDINGS Alignment: There is straightening of the cervical spine. No listhesis. Skull base and vertebrae: The craniocervical junction is unremarkable. The atlantodental interval is normal. No acute cervical spine fracture. No lytic or blastic bone lesion. Soft tissues and spinal canal: No prevertebral fluid or swelling. No visible canal hematoma. Disc levels: Review of the sagittal reformats demonstrates preservation of vertebral body height. There is diffuse intervertebral disc space narrowing and endplate remodeling throughout the cervical spine in  keeping with changes of diffuse moderate to severe degenerative disc disease. Review of the axial images demonstrates a posterior disc osteophyte complex at C3-4, C4-5, and C5-6 resulting in mild-to-moderate central canal stenosis with flattening of the thecal sac, most severe at C4-5 where large herniated disc material is identified. The AP diameter of the spinal canal in this region is approximately 5-6 mm. Multilevel uncovertebral and facet arthrosis results in multilevel mild neural foraminal narrowing, most severe on the left at C6-7 Upper chest: Unremarkable Other: None significant IMPRESSION: 1. No evidence of acute intracranial process. 2. No acute cervical spine fracture or listhesis. 3. Small soft tissue laceration at the right parietal scalp at the vertex with associated small subcutaneous hematoma and small right parietal scalp hematoma. 4. Moderate to severe multilevel degenerative disc disease of the cervical spine, most severe at C4-5 where there is a large herniated disc material. The AP diameter of the spinal canal in this region is approximately 5-6 mm. Multilevel mild neural foraminal narrowing, most severe on the left at C6-7 where there is also uncovertebral and facet arthrosis. 5. Moderate periventricular white matter changes likely reflecting the sequela of small vessel ischemia. Previously noted intraparenchymal hematoma within the left basal ganglia and intraventricular hemorrhage has resolved. There is residual encephalomalacia within the posterior limb of the left internal capsule and left thalamus. Electronically Signed   By: Helyn NumbersAshesh  Parikh MD   On: 03/14/2020 19:28    Procedures Procedures (including critical care time)  LACERATION REPAIR Performed by: Burgess AmorJulie Cledith Abdou Authorized by: Burgess AmorJulie Aditri Louischarles Consent: Verbal consent obtained. Risks and benefits: risks, benefits and alternatives were discussed Consent given by: patient Patient identity confirmed: provided demographic data Prepped  and Draped in normal sterile fashion Wound explored  Laceration Location: right parietal scalp  Laceration Length: 2cm  No Foreign Bodies seen or palpated  Anesthesia: n/a Local anesthetic: local topical LET  Anesthetic total: 4 ml  Irrigation method: syringe Amount of cleaning: standard  Skin closure: staples  Number of sutures: 3 staples  Technique: staples  Patient tolerance: Patient tolerated the procedure well with no immediate complications.   Medications Ordered in ED Medications  lidocaine-EPINEPHrine-tetracaine (LET) topical gel (3 mLs Topical Given 03/14/20 2024)  acetaminophen (TYLENOL) tablet 1,000 mg (1,000 mg Oral Given 03/14/20 2024)    ED Course  I have reviewed the triage vital signs and the nursing notes.  Pertinent labs & imaging results that were available during my care of the patient were reviewed by me and considered in my medical decision making (see chart for details).    MDM Rules/Calculators/A&P                          Imaging reviewed and discussed with pt including cervical disk  herniation.  I suspect this is a chronic finding.  Staple repair of wound. No internal head injury.    Wound care instructions given.  Pt advised to have staples removed in 10 days,  Return here sooner for any signs of infection including redness, swelling, worse pain or drainage of pus.    Final Clinical Impression(s) / ED Diagnoses Final diagnoses:  Fall, initial encounter  Injury of head, initial encounter  Scalp laceration, initial encounter    Rx / DC Orders ED Discharge Orders    None       Victoriano Lain 03/15/20 1417    Long, Arlyss Repress, MD 03/15/20 [phone]

## 2020-03-14 NOTE — ED Notes (Signed)
Gave report to [place] at [facility].

## 2020-03-14 NOTE — ED Triage Notes (Signed)
[facility] staff says pt fell from a lift and struck head on the brakes of the stretcher.  Denies any loss of consciousness.

## 2020-03-15 ENCOUNTER — Encounter: Payer: Self-pay | Admitting: Adult Health

## 2020-03-15 ENCOUNTER — Non-Acute Institutional Stay (SKILLED_NURSING_FACILITY): Payer: Medicare Other | Admitting: Adult Health

## 2020-03-15 DIAGNOSIS — F4323 Adjustment disorder with mixed anxiety and depressed mood: Secondary | ICD-10-CM | POA: Diagnosis not present

## 2020-03-15 DIAGNOSIS — S0101XA Laceration without foreign body of scalp, initial encounter: Secondary | ICD-10-CM | POA: Insufficient documentation

## 2020-03-15 DIAGNOSIS — W19XXXA Unspecified fall, initial encounter: Secondary | ICD-10-CM | POA: Insufficient documentation

## 2020-03-15 DIAGNOSIS — W19XXXS Unspecified fall, sequela: Secondary | ICD-10-CM | POA: Diagnosis not present

## 2020-03-15 DIAGNOSIS — Y92129 Unspecified place in nursing home as the place of occurrence of the external cause: Secondary | ICD-10-CM | POA: Diagnosis not present

## 2020-03-15 DIAGNOSIS — S0101XS Laceration without foreign body of scalp, sequela: Secondary | ICD-10-CM | POA: Diagnosis not present

## 2020-03-15 NOTE — Progress Notes (Signed)
Location:    [facility] Room Number: 114/W Place of Service:  SNF (31)   CODE STATUS: DNR  Allergies  Allergen Reactions  . Contrast Media [Iodinated Diagnostic Agents] Anaphylaxis    Chief Complaint  Patient presents with  . Follow-up    Follow Up ED Visit    HPI:  She slid out of her wheelchair yesterday hitting her head. She did go to the ED for stitches.  She has 5 staples in place. She does complain of generalized body aches and pains. She denies any dizziness present. She does have a headache; which is to be expected.   Past Medical History:  Diagnosis Date  . Arthritis   . Coronary artery disease    angina  . Diabetes mellitus without complication (HCC)   . Hypertension     Past Surgical History:  Procedure Laterality Date  . APPENDECTOMY    . CHOLECYSTECTOMY      Social History   Socioeconomic History  . Marital status: Widowed    Spouse name: Not on file  . Number of children: Not on file  . Years of education: Not on file  . Highest education level: Not on file  Occupational History  . Not on file  Tobacco Use  . Smoking status: Never Smoker  . Smokeless tobacco: Never Used  Vaping Use  . Vaping Use: Never used  Substance and Sexual Activity  . Alcohol use: No  . Drug use: No  . Sexual activity: Not on file  Other Topics Concern  . Not on file  Social History Narrative  . Not on file   Social Determinants of Health   Financial Resource Strain:   . Difficulty of Paying Living Expenses: Not on file  Food Insecurity:   . Worried About Programme researcher, broadcasting/film/video in the Last Year: Not on file  . Ran Out of Food in the Last Year: Not on file  Transportation Needs:   . Lack of Transportation (Medical): Not on file  . Lack of Transportation (Non-Medical): Not on file  Physical Activity:   . Days of Exercise per Week: Not on file  . Minutes of Exercise per Session: Not on file  Stress:   . Feeling of Stress : Not on file    Social Connections:   . Frequency of Communication with Friends and Family: Not on file  . Frequency of Social Gatherings with Friends and Family: Not on file  . Attends Religious Services: Not on file  . Active Member of Clubs or Organizations: Not on file  . Attends Banker Meetings: Not on file  . Marital Status: Not on file  Intimate Partner Violence:   . Fear of Current or Ex-Partner: Not on file  . Emotionally Abused: Not on file  . Physically Abused: Not on file  . Sexually Abused: Not on file   No family history on file.    VITAL SIGNS BP 133/71   Pulse 65   Temp 98.4 F (36.9 C) (Oral)   Resp 18   Ht 5\' 4"  (1.626 m)   Wt 160 lb 3.2 oz (72.7 kg)   SpO2 97%   BMI 27.50 kg/m   Outpatient Encounter Medications as of 03/15/2020  Medication Sig  . acetaminophen (TYLENOL) 325 MG tablet Take 650 mg by mouth 2 (two) times daily as needed.  03/17/2020 amLODipine (NORVASC) 10 MG tablet Take 1 tablet (10 mg total) by mouth daily.  [name] atorvastatin (LIPITOR) 20  MG tablet Take 1 tablet (20 mg total) by mouth daily at 6 PM.  . Balsam [place]-Castor Oil (VENELEX) OINT Apply topically. Apply to sacrum and bilateral buttocks qshift for prevention.  [name]. lisinopril (ZESTRIL) 10 MG tablet Take 10 mg by mouth daily.  [name]. LORazepam (ATIVAN) 0.5 MG tablet Take 1 tablet (0.5 mg total) by mouth at bedtime.  . metFORMIN (GLUCOPHAGE) 500 MG tablet Take 1 tablet by mouth 2 (two) times daily.  . NON FORMULARY Diet Change: Regular, carbohydrate consistent with thin liquids  . NON FORMULARY Accu-check qam. Notify provider of results under 60 or over 400. Once A Day  . sertraline (ZOLOFT) 100 MG tablet Take 100 mg by mouth daily. Take along with 25 mg to = 125 mg  . sertraline (ZOLOFT) 25 MG tablet Take 25 mg by mouth daily. Take along with 100 mg to = 125 mg   No facility-administered encounter medications on file as of 03/15/2020.     SIGNIFICANT DIAGNOSTIC EXAMS   PREVIOUS   07-26-19: ct of  head: 1. 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 2. Mild chronic small vessel ischemic disease.  07-27-19: ct of head:  1. Stable size of left thalamic and posterior limb internal capsule hemorrhage with increasing surrounding edema, as expected. 2. Increasing mass effect with partial effacement of the left lateral ventricle and 5 mm of midline shift. 3. Stable intraventricular hemorrhage.  07-26-18: 2-d echo:  Left Ventricle: Left ventricular ejection fraction, by visual estimation, is 65 to 70%. The left ventricle has normal function. The left ventricle is not well visualized. There is moderately increased left ventricular hypertrophy. Left ventricular  diastolic parameters are consistent with Grade I diastolic dysfunction (impaired relaxation). Elevated left ventricular end-diastolic pressure.   Right Ventricle: The right ventricular size is normal. No increase in right ventricular wall thickness. Global RV systolic function is has normal systolic function. Left Atrium: Left atrial size was normal in size. Right Atrium: Right atrial size was normal in size Pericardium: There is no evidence of pericardial effusion. Mitral Valve: The mitral valve is normal in structure. No evidence of mitral valve regurgitation. No evidence of mitral valve stenosis by observation. MV peak gradient, 7.4 mmHg. Tricuspid Valve: The tricuspid valve is normal in structure. Tricuspid valve regurgitation is trivial.   Aortic Valve: The aortic valve was not well visualized. Aortic valve regurgitation is not visualized. Mild aortic valve sclerosis is present, with no evidence of aortic valve stenosis. Aortic valve mean gradient measures 6.0 mmHg. Aortic valve peak  gradient measures 11.0 mmHg. Aortic valve area, by VTI measures 2.05 cm.  07-28-19: ct of head: 1. Unchanged size and appearance of left thalamic intraparenchymal hematoma with intraventricular extension. 2. Unchanged minimal rightward  midline shift.  07-28-19: swallow study: Dysphagia 1 (Puree) solids;Honey thick liquids   07-31-19: MRI/MRA of head:  Parenchymal hemorrhage involving the left thalamus and adjacent white matter with intraventricular extension. Associated mass effect is similar to recent CT. No hydrocephalus. Moderate chronic microvascular ischemic changes. Mild irregularity and stenosis of left P1 PCA. Otherwise unremarkable MRA of the head.  02-03-20: DEXA t score -1.009  TODAY  03-14-20: ct of head and cervical spine:  1. No evidence of acute intracranial process. 2. No acute cervical spine fracture or listhesis. 3. Small soft tissue laceration at the right parietal scalp at the vertex with associated small subcutaneous hematoma and small right parietal scalp hematoma. 4. Moderate to severe multilevel degenerative disc disease of the cervical spine, most severe at  C4-5 where there is a large herniated disc material. The AP diameter of the spinal canal in this region is approximately 5-6 mm. Multilevel mild neural foraminal narrowing, most severe on the left at C6-7 where there is also uncovertebral and facet arthrosis. 5. Moderate periventricular white matter changes likely reflecting the sequela of small vessel ischemia. Previously noted intraparenchymal hematoma within the left basal ganglia and intraventricular hemorrhage has resolved. There is residual encephalomalacia within the posterior limb of the left internal capsule and left thalamus.     LABS REVIEWED PREVIOUS  07-26-19: wbc 6.9; hgb 15.5; hct 45.8; mcv 91.4 plt 208; glucose 227; bun 15 creat 0.60; k+ 7.9; na++ 132; ca 8.9; liver normal albumin 3.2 hgb a1c 6.6 07-27-19: chol 205; ldl 120; trig 166; hdl 53 08-31-91: wbc 10.3; hgb 14.3; hct 43.5; mcv 91.8 plt 211; glucose 177; bun 21; creat 0.85; k+ 3.7; na++ 139; ca 8.4 08-01-19: wbc 9.6; hgb 14.5; ht 45.1; mcv 93.8 plt 213; glucose 131; bun 21; creat 0.67; k+ 3.7; na++ 148 ca 8.6 08-02-19: wbc 9.2; hgb  14.2; hct 44.6; mcv 95.3 plt 217; glucose 199; bun 21; creat 0.62; k+ 3.6; na++ 142; ca 8.7; mag 2.0 08-03-19: wbc 11.3; hgb 14.3; hct 44.5; mcv 93.9; plt 236; glucose 162; bun 19; creat 0.68; k+ 3.7; na++ 139; ca 8.4  12-16-19: urine micro-albumin 8.3 ( on ACE)  hgb a1c 6.9 12-23-19: chol 94; ldl 33 trig 93 hdl 42 vit B 12: 371 02-03-20: wbc 7.9; hgb 13.5; hct 42.3; mcv 93.8 plt 223; glucose  129; bun 11; creat 0.44; k+ 4.1; na++ 133 ca 9.0 liver normal albumin 3.1   NO NEW LABS.   Review of Systems  Constitutional: Negative for malaise/fatigue.  Respiratory: Negative for cough and shortness of breath.   Cardiovascular: Negative for chest pain, palpitations and leg swelling.  Gastrointestinal: Negative for abdominal pain, constipation and heartburn.  Musculoskeletal:       Has generalized aches and pains from fall   Skin: Negative.   Neurological: Negative for dizziness.  Psychiatric/Behavioral: The patient is not nervous/anxious.     Physical Exam Constitutional:      General: She is not in acute distress.    Appearance: She is well-developed. She is not diaphoretic.  Neck:     Thyroid: No thyromegaly.  Cardiovascular:     Rate and Rhythm: Normal rate and regular rhythm.     Pulses: Normal pulses.     Heart sounds: Normal heart sounds.  Pulmonary:     Effort: Pulmonary effort is normal. No respiratory distress.     Breath sounds: Normal breath sounds.  Abdominal:     General: Bowel sounds are normal. There is no distension.     Palpations: Abdomen is soft.     Tenderness: There is no abdominal tenderness.  Musculoskeletal:     Cervical back: Neck supple.     Right lower leg: No edema.     Left lower leg: No edema.     Comments: Right hemiplegia   Lymphadenopathy:     Cervical: No cervical adenopathy.  Skin:    General: Skin is warm and dry.     Comments: Staples intact without signs of infection present   Neurological:     Mental Status: She is alert. Mental status is at  baseline.  Psychiatric:        Mood and Affect: Mood normal.        ASSESSMENT/ PLAN:  TODAY  1. Laceration 2. Fall  at nursing home  Will not make changes Will monitor her status.     MD is aware of resident's narcotic use and is in agreement with current plan of care. We will attempt to wean resident as appropriate.  Synthia Innocent NP [facility]  Contact [phone] Monday through Friday 8am- 5pm  After hours call [phone]

## 2020-03-17 ENCOUNTER — Non-Acute Institutional Stay: Payer: Self-pay | Admitting: Adult Health

## 2020-03-17 ENCOUNTER — Other Ambulatory Visit: Payer: Self-pay | Admitting: Adult Health

## 2020-03-17 ENCOUNTER — Encounter: Payer: Self-pay | Admitting: Adult Health

## 2020-03-17 DIAGNOSIS — F331 Major depressive disorder, recurrent, moderate: Secondary | ICD-10-CM

## 2020-03-17 MED ORDER — LORAZEPAM 0.5 MG PO TABS: 0.2500 mg | ORAL_TABLET | Freq: Every day | ORAL | 0 refills | Status: DC

## 2020-03-20 NOTE — Progress Notes (Signed)
Location:   [facility] Room Number: 114/W Place of Service:  SNF (31)   CODE STATUS: dnr  Allergies  Allergen Reactions  . Contrast Media [Iodinated Diagnostic Agents] Anaphylaxis    Chief Complaint  Patient presents with  . Acute Visit    medication review     HPI:  She is presently taking zoloft 125 mg daily and ativan 0.5 mg nightly. She has had one fall with injury in the past month. There are no reports of agitation or anxiety. No reports of depressive thoughts. She is tolerating medications without difficulty. She is emotionally stable. She is getting out of bed.   Past Medical History:  Diagnosis Date  . Arthritis   . Coronary artery disease    angina  . Diabetes mellitus without complication (HCC)   . Hypertension     Past Surgical History:  Procedure Laterality Date  . APPENDECTOMY    . CHOLECYSTECTOMY      Social History   Socioeconomic History  . Marital status: Widowed    Spouse name: Not on file  . Number of children: Not on file  . Years of education: Not on file  . Highest education level: Not on file  Occupational History  . Not on file  Tobacco Use  . Smoking status: Never Smoker  . Smokeless tobacco: Never Used  Vaping Use  . Vaping Use: Never used  Substance and Sexual Activity  . Alcohol use: No  . Drug use: No  . Sexual activity: Not on file  Other Topics Concern  . Not on file  Social History Narrative  . Not on file   Social Determinants of Health   Financial Resource Strain:   . Difficulty of Paying Living Expenses: Not on file  Food Insecurity:   . Worried About Programme researcher, broadcasting/film/videounning Out of Food in the Last Year: Not on file  . Ran Out of Food in the Last Year: Not on file  Transportation Needs:   . Lack of Transportation (Medical): Not on file  . Lack of Transportation (Non-Medical): Not on file  Physical Activity:   . Days of Exercise per Week: Not on file  . Minutes of Exercise per Session: Not on file  Stress:   .  Feeling of Stress : Not on file  Social Connections:   . Frequency of Communication with Friends and Family: Not on file  . Frequency of Social Gatherings with Friends and Family: Not on file  . Attends Religious Services: Not on file  . Active Member of Clubs or Organizations: Not on file  . Attends BankerClub or Organization Meetings: Not on file  . Marital Status: Not on file  Intimate Partner Violence:   . Fear of Current or Ex-Partner: Not on file  . Emotionally Abused: Not on file  . Physically Abused: Not on file  . Sexually Abused: Not on file   No family history on file.    VITAL SIGNS BP 137/71   Pulse 70   Temp 98.2 F (36.8 C) (Oral)   Resp 18   Ht 5\' 4"  (1.626 m)   Wt 160 lb 3.2 oz (72.7 kg)   SpO2 97%   BMI 27.50 kg/m   Outpatient Encounter Medications as of 03/17/2020  Medication Sig  . acetaminophen (TYLENOL) 325 MG tablet Take 650 mg by mouth 2 (two) times daily as needed.  [name]. amLODipine (NORVASC) 10 MG tablet Take 1 tablet (10 mg total) by mouth daily.  [name]. atorvastatin (  LIPITOR) 20 MG tablet Take 1 tablet (20 mg total) by mouth daily at 6 PM.  . Balsam [place]-Castor Oil (VENELEX) OINT Apply topically. Apply to sacrum and bilateral buttocks qshift for prevention.  [name] lisinopril (ZESTRIL) 10 MG tablet Take 10 mg by mouth daily.  [name] LORazepam (ATIVAN) 0.5 MG tablet Take 0.5 mg by mouth at bedtime.  . metFORMIN (GLUCOPHAGE) 500 MG tablet Take 1 tablet by mouth 2 (two) times daily.  . NON FORMULARY Diet Change: Regular, carbohydrate consistent with thin liquids  . NON FORMULARY Accu-check qam. Notify provider of results under 60 or over 400. Once A Day  . sertraline (ZOLOFT) 100 MG tablet Take 100 mg by mouth daily. Take along with 25 mg to = 125 mg  . sertraline (ZOLOFT) 25 MG tablet Take 25 mg by mouth daily. Take along with 100 mg to = 125 mg   No facility-administered encounter medications on file as of 03/17/2020.     SIGNIFICANT DIAGNOSTIC EXAMS  PREVIOUS    07-26-19: ct of head: 1. 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 2. Mild chronic small vessel ischemic disease.  07-27-19: ct of head:  1. Stable size of left thalamic and posterior limb internal capsule hemorrhage with increasing surrounding edema, as expected. 2. Increasing mass effect with partial effacement of the left lateral ventricle and 5 mm of midline shift. 3. Stable intraventricular hemorrhage.  07-26-18: 2-d echo:  Left Ventricle: Left ventricular ejection fraction, by visual estimation, is 65 to 70%. The left ventricle has normal function. The left ventricle is not well visualized. There is moderately increased left ventricular hypertrophy. Left ventricular  diastolic parameters are consistent with Grade I diastolic dysfunction (impaired relaxation). Elevated left ventricular end-diastolic pressure.   Right Ventricle: The right ventricular size is normal. No increase in right ventricular wall thickness. Global RV systolic function is has normal systolic function. Left Atrium: Left atrial size was normal in size. Right Atrium: Right atrial size was normal in size Pericardium: There is no evidence of pericardial effusion. Mitral Valve: The mitral valve is normal in structure. No evidence of mitral valve regurgitation. No evidence of mitral valve stenosis by observation. MV peak gradient, 7.4 mmHg. Tricuspid Valve: The tricuspid valve is normal in structure. Tricuspid valve regurgitation is trivial.   Aortic Valve: The aortic valve was not well visualized. Aortic valve regurgitation is not visualized. Mild aortic valve sclerosis is present, with no evidence of aortic valve stenosis. Aortic valve mean gradient measures 6.0 mmHg. Aortic valve peak  gradient measures 11.0 mmHg. Aortic valve area, by VTI measures 2.05 cm.  07-28-19: ct of head: 1. Unchanged size and appearance of left thalamic intraparenchymal hematoma with intraventricular extension. 2. Unchanged  minimal rightward midline shift.  07-28-19: swallow study: Dysphagia 1 (Puree) solids;Honey thick liquids   07-31-19: MRI/MRA of head:  Parenchymal hemorrhage involving the left thalamus and adjacent white matter with intraventricular extension. Associated mass effect is similar to recent CT. No hydrocephalus. Moderate chronic microvascular ischemic changes. Mild irregularity and stenosis of left P1 PCA. Otherwise unremarkable MRA of the head.  02-03-20: DEXA t score -1.009  03-14-20: ct of head and cervical spine:  1. No evidence of acute intracranial process. 2. No acute cervical spine fracture or listhesis. 3. Small soft tissue laceration at the right parietal scalp at the vertex with associated small subcutaneous hematoma and small right parietal scalp hematoma. 4. Moderate to severe multilevel degenerative disc disease of the cervical spine, most severe at C4-5 where there is  a large herniated disc material. The AP diameter of the spinal canal in this region is approximately 5-6 mm. Multilevel mild neural foraminal narrowing, most severe on the left at C6-7 where there is also uncovertebral and facet arthrosis. 5. Moderate periventricular white matter changes likely reflecting the sequela of small vessel ischemia. Previously noted intraparenchymal hematoma within the left basal ganglia and intraventricular hemorrhage has resolved. There is residual encephalomalacia within the posterior limb of the left internal capsule and left thalamus.   NO NEW EXAMS.   LABS REVIEWED PREVIOUS  07-26-19: wbc 6.9; hgb 15.5; hct 45.8; mcv 91.4 plt 208; glucose 227; bun 15 creat 0.60; k+ 7.9; na++ 132; ca 8.9; liver normal albumin 3.2 hgb a1c 6.6 07-27-19: chol 205; ldl 120; trig 166; hdl 53 03-25-84: wbc 10.3; hgb 14.3; hct 43.5; mcv 91.8 plt 211; glucose 177; bun 21; creat 0.85; k+ 3.7; na++ 139; ca 8.4 08-01-19: wbc 9.6; hgb 14.5; ht 45.1; mcv 93.8 plt 213; glucose 131; bun 21; creat 0.67; k+ 3.7; na++ 148 ca  8.6 08-02-19: wbc 9.2; hgb 14.2; hct 44.6; mcv 95.3 plt 217; glucose 199; bun 21; creat 0.62; k+ 3.6; na++ 142; ca 8.7; mag 2.0 08-03-19: wbc 11.3; hgb 14.3; hct 44.5; mcv 93.9; plt 236; glucose 162; bun 19; creat 0.68; k+ 3.7; na++ 139; ca 8.4  12-16-19: urine micro-albumin 8.3 ( on ACE)  hgb a1c 6.9 12-23-19: chol 94; ldl 33 trig 93 hdl 42 vit B 12: 371 02-03-20: wbc 7.9; hgb 13.5; hct 42.3; mcv 93.8 plt 223; glucose  129; bun 11; creat 0.44; k+ 4.1; na++ 133 ca 9.0 liver normal albumin 3.1   NO NEW LABS.    Review of Systems  Constitutional: Negative for malaise/fatigue.  Respiratory: Negative for cough and shortness of breath.   Cardiovascular: Negative for chest pain, palpitations and leg swelling.  Gastrointestinal: Negative for abdominal pain, constipation and heartburn.  Musculoskeletal: Negative for back pain, joint pain and myalgias.  Skin: Negative.   Neurological: Negative for dizziness.  Psychiatric/Behavioral: The patient is not nervous/anxious.     Physical Exam Constitutional:      General: She is not in acute distress.    Appearance: She is well-developed. She is not diaphoretic.  Neck:     Thyroid: No thyromegaly.  Cardiovascular:     Rate and Rhythm: Normal rate and regular rhythm.     Pulses: Normal pulses.     Heart sounds: Normal heart sounds.  Pulmonary:     Effort: Pulmonary effort is normal. No respiratory distress.     Breath sounds: Normal breath sounds.  Abdominal:     General: Bowel sounds are normal. There is no distension.     Palpations: Abdomen is soft.     Tenderness: There is no abdominal tenderness.  Musculoskeletal:     Cervical back: Neck supple.     Right lower leg: No edema.     Left lower leg: No edema.     Comments: Right hemiplegia   Lymphadenopathy:     Cervical: No cervical adenopathy.  Skin:    General: Skin is warm and dry.  Neurological:     Mental Status: She is alert. Mental status is at baseline.  Psychiatric:        Mood  and Affect: Mood normal.        ASSESSMENT/ PLAN:  TODAY  1. Moderate episode of moderate major depressive disorder:   Is stable Will continue zoloft 125 mg daily  Will lower her ativan  to 0.25 mg nightly  Will monitor her status.   MD is aware of resident's narcotic use and is in agreement with current plan of care. We will attempt to wean resident as appropriate.  Synthia Innocent NP [facility]  Contact [phone] Monday through Friday 8am- 5pm  After hours call [phone]

## 2020-03-21 ENCOUNTER — Other Ambulatory Visit: Payer: Self-pay | Admitting: Adult Health

## 2020-03-22 ENCOUNTER — Encounter: Payer: Self-pay | Admitting: Adult Health

## 2020-03-22 ENCOUNTER — Non-Acute Institutional Stay (SKILLED_NURSING_FACILITY): Payer: Medicare Other | Admitting: Adult Health

## 2020-03-22 DIAGNOSIS — F331 Major depressive disorder, recurrent, moderate: Secondary | ICD-10-CM | POA: Diagnosis not present

## 2020-03-22 DIAGNOSIS — F4323 Adjustment disorder with mixed anxiety and depressed mood: Secondary | ICD-10-CM | POA: Diagnosis not present

## 2020-03-22 NOTE — Progress Notes (Signed)
Location:    [facility] Room Number: 114/W Place of Service:  SNF (31)   CODE STATUS: DNR  Allergies  Allergen Reactions  . Contrast Media [Iodinated Diagnostic Agents] Anaphylaxis    Chief Complaint  Patient presents with  . Acute Visit    Anxiety    HPI:  She has had her ativan lowered to 0.25 mg nightly for a gradual dose reduction. She states that since that time she has not been sleeping well at night; she is more nervous and anxious all the time. She would like for her medication to be returned back to its previous dose.   Past Medical History:  Diagnosis Date  . Arthritis   . Coronary artery disease    angina  . Diabetes mellitus without complication (HCC)   . Hypertension     Past Surgical History:  Procedure Laterality Date  . APPENDECTOMY    . CHOLECYSTECTOMY      Social History   Socioeconomic History  . Marital status: Widowed    Spouse name: Not on file  . Number of children: Not on file  . Years of education: Not on file  . Highest education level: Not on file  Occupational History  . Not on file  Tobacco Use  . Smoking status: Never Smoker  . Smokeless tobacco: Never Used  Vaping Use  . Vaping Use: Never used  Substance and Sexual Activity  . Alcohol use: No  . Drug use: No  . Sexual activity: Not on file  Other Topics Concern  . Not on file  Social History Narrative  . Not on file   Social Determinants of Health   Financial Resource Strain:   . Difficulty of Paying Living Expenses: Not on file  Food Insecurity:   . Worried About Programme researcher, broadcasting/film/video in the Last Year: Not on file  . Ran Out of Food in the Last Year: Not on file  Transportation Needs:   . Lack of Transportation (Medical): Not on file  . Lack of Transportation (Non-Medical): Not on file  Physical Activity:   . Days of Exercise per Week: Not on file  . Minutes of Exercise per Session: Not on file  Stress:   . Feeling of Stress : Not on file    Social Connections:   . Frequency of Communication with Friends and Family: Not on file  . Frequency of Social Gatherings with Friends and Family: Not on file  . Attends Religious Services: Not on file  . Active Member of Clubs or Organizations: Not on file  . Attends Banker Meetings: Not on file  . Marital Status: Not on file  Intimate Partner Violence:   . Fear of Current or Ex-Partner: Not on file  . Emotionally Abused: Not on file  . Physically Abused: Not on file  . Sexually Abused: Not on file   No family history on file.    VITAL SIGNS BP 124/65   Pulse (!) 59   Temp 98.4 F (36.9 C) (Oral)   Resp 18   Ht 5\' 4"  (1.626 m)   Wt 160 lb 3.2 oz (72.7 kg)   SpO2 97%   BMI 27.50 kg/m   Outpatient Encounter Medications as of 03/22/2020  Medication Sig  . acetaminophen (TYLENOL) 325 MG tablet Take 650 mg by mouth every 6 (six) hours as needed.   05/22/2020 amLODipine (NORVASC) 10 MG tablet Take 1 tablet (10 mg total) by mouth daily.  [name]  atorvastatin (LIPITOR) 20 MG tablet Take 1 tablet (20 mg total) by mouth daily at 6 PM.  . Balsam [place]-Castor Oil (VENELEX) OINT Apply topically. Apply to sacrum and bilateral buttocks qshift for prevention.  [name] lisinopril (ZESTRIL) 10 MG tablet Take 10 mg by mouth daily.  [name] LORazepam (ATIVAN) 0.5 MG tablet Take 0.5 tablets (0.25 mg total) by mouth at bedtime.  . metFORMIN (GLUCOPHAGE) 500 MG tablet Take 1 tablet by mouth 2 (two) times daily.  . NON FORMULARY Diet Change: Regular, carbohydrate consistent with thin liquids  . NON FORMULARY Accu-check qam. Notify provider of results under 60 or over 400. Once A Day  . sertraline (ZOLOFT) 100 MG tablet Take 100 mg by mouth daily. Take along with 25 mg to = 125 mg  . sertraline (ZOLOFT) 25 MG tablet Take 25 mg by mouth daily. Take along with 100 mg to = 125 mg   No facility-administered encounter medications on file as of 03/22/2020.     SIGNIFICANT DIAGNOSTIC EXAMS   PREVIOUS   07-26-19:  ct of head: 1. 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 2. Mild chronic small vessel ischemic disease.  07-27-19: ct of head:  1. Stable size of left thalamic and posterior limb internal capsule hemorrhage with increasing surrounding edema, as expected. 2. Increasing mass effect with partial effacement of the left lateral ventricle and 5 mm of midline shift. 3. Stable intraventricular hemorrhage.  07-26-18: 2-d echo:  Left Ventricle: Left ventricular ejection fraction, by visual estimation, is 65 to 70%. The left ventricle has normal function. The left ventricle is not well visualized. There is moderately increased left ventricular hypertrophy. Left ventricular  diastolic parameters are consistent with Grade I diastolic dysfunction (impaired relaxation). Elevated left ventricular end-diastolic pressure.   Right Ventricle: The right ventricular size is normal. No increase in right ventricular wall thickness. Global RV systolic function is has normal systolic function. Left Atrium: Left atrial size was normal in size. Right Atrium: Right atrial size was normal in size Pericardium: There is no evidence of pericardial effusion. Mitral Valve: The mitral valve is normal in structure. No evidence of mitral valve regurgitation. No evidence of mitral valve stenosis by observation. MV peak gradient, 7.4 mmHg. Tricuspid Valve: The tricuspid valve is normal in structure. Tricuspid valve regurgitation is trivial.   Aortic Valve: The aortic valve was not well visualized. Aortic valve regurgitation is not visualized. Mild aortic valve sclerosis is present, with no evidence of aortic valve stenosis. Aortic valve mean gradient measures 6.0 mmHg. Aortic valve peak  gradient measures 11.0 mmHg. Aortic valve area, by VTI measures 2.05 cm.  07-28-19: ct of head: 1. Unchanged size and appearance of left thalamic intraparenchymal hematoma with intraventricular extension. 2. Unchanged minimal rightward  midline shift.  07-28-19: swallow study: Dysphagia 1 (Puree) solids;Honey thick liquids   07-31-19: MRI/MRA of head:  Parenchymal hemorrhage involving the left thalamus and adjacent white matter with intraventricular extension. Associated mass effect is similar to recent CT. No hydrocephalus. Moderate chronic microvascular ischemic changes. Mild irregularity and stenosis of left P1 PCA. Otherwise unremarkable MRA of the head.  02-03-20: DEXA t score -1.009  03-14-20: ct of head and cervical spine:  1. No evidence of acute intracranial process. 2. No acute cervical spine fracture or listhesis. 3. Small soft tissue laceration at the right parietal scalp at the vertex with associated small subcutaneous hematoma and small right parietal scalp hematoma. 4. Moderate to severe multilevel degenerative disc disease of the cervical spine, most severe  at C4-5 where there is a large herniated disc material. The AP diameter of the spinal canal in this region is approximately 5-6 mm. Multilevel mild neural foraminal narrowing, most severe on the left at C6-7 where there is also uncovertebral and facet arthrosis. 5. Moderate periventricular white matter changes likely reflecting the sequela of small vessel ischemia. Previously noted intraparenchymal hematoma within the left basal ganglia and intraventricular hemorrhage has resolved. There is residual encephalomalacia within the posterior limb of the left internal capsule and left thalamus.   NO NEW EXAMS.   LABS REVIEWED PREVIOUS  07-26-19: wbc 6.9; hgb 15.5; hct 45.8; mcv 91.4 plt 208; glucose 227; bun 15 creat 0.60; k+ 7.9; na++ 132; ca 8.9; liver normal albumin 3.2 hgb a1c 6.6 07-27-19: chol 205; ldl 120; trig 166; hdl 53 1-6-101-8-21: wbc 10.3; hgb 14.3; hct 43.5; mcv 91.8 plt 211; glucose 177; bun 21; creat 0.85; k+ 3.7; na++ 139; ca 8.4 08-01-19: wbc 9.6; hgb 14.5; ht 45.1; mcv 93.8 plt 213; glucose 131; bun 21; creat 0.67; k+ 3.7; na++ 148 ca 8.6 08-02-19: wbc 9.2;  hgb 14.2; hct 44.6; mcv 95.3 plt 217; glucose 199; bun 21; creat 0.62; k+ 3.6; na++ 142; ca 8.7; mag 2.0 08-03-19: wbc 11.3; hgb 14.3; hct 44.5; mcv 93.9; plt 236; glucose 162; bun 19; creat 0.68; k+ 3.7; na++ 139; ca 8.4  12-16-19: urine micro-albumin 8.3 ( on ACE)  hgb a1c 6.9 12-23-19: chol 94; ldl 33 trig 93 hdl 42 vit B 12: 371 02-03-20: wbc 7.9; hgb 13.5; hct 42.3; mcv 93.8 plt 223; glucose  129; bun 11; creat 0.44; k+ 4.1; na++ 133 ca 9.0 liver normal albumin 3.1   NO NEW LABS.    Review of Systems  Constitutional: Negative for malaise/fatigue.  Respiratory: Negative for cough and shortness of breath.   Cardiovascular: Negative for chest pain, palpitations and leg swelling.  Gastrointestinal: Negative for abdominal pain, constipation and heartburn.  Musculoskeletal: Negative for back pain, joint pain and myalgias.  Skin: Negative.   Neurological: Negative for dizziness.  Psychiatric/Behavioral: The patient is nervous/anxious and has insomnia.     Physical Exam Constitutional:      General: She is not in acute distress.    Appearance: She is well-developed. She is not diaphoretic.  Neck:     Thyroid: No thyromegaly.  Cardiovascular:     Rate and Rhythm: Normal rate and regular rhythm.     Pulses: Normal pulses.     Heart sounds: Normal heart sounds.  Pulmonary:     Effort: Pulmonary effort is normal. No respiratory distress.     Breath sounds: Normal breath sounds.  Abdominal:     General: Bowel sounds are normal. There is no distension.     Palpations: Abdomen is soft.     Tenderness: There is no abdominal tenderness.  Musculoskeletal:     Cervical back: Neck supple.     Right lower leg: No edema.     Left lower leg: No edema.     Comments: Right hemiplegia   Lymphadenopathy:     Cervical: No cervical adenopathy.  Skin:    General: Skin is warm and dry.  Neurological:     Mental Status: She is alert. Mental status is at baseline.  Psychiatric:        Mood and  Affect: Mood normal.       ASSESSMENT/ PLAN:  TODAY  1. Moderate episode of recurrent major depressive disorder  She has not tolerated wean Will return to  ativan 0.5 mg nightly  Will continue to monitor her status.   MD is aware of resident's narcotic use and is in agreement with current plan of care. We will attempt to wean resident as appropriate.  Synthia Innocent NP [facility]  Contact [phone] Monday through Friday 8am- 5pm  After hours call [phone]

## 2020-03-29 DIAGNOSIS — F4323 Adjustment disorder with mixed anxiety and depressed mood: Secondary | ICD-10-CM | POA: Diagnosis not present

## 2020-04-04 ENCOUNTER — Other Ambulatory Visit: Payer: Self-pay | Admitting: Adult Health

## 2020-04-05 DIAGNOSIS — F4323 Adjustment disorder with mixed anxiety and depressed mood: Secondary | ICD-10-CM | POA: Diagnosis not present

## 2020-04-07 ENCOUNTER — Other Ambulatory Visit: Payer: Self-pay | Admitting: Adult Health

## 2020-04-07 DIAGNOSIS — F411 Generalized anxiety disorder: Secondary | ICD-10-CM | POA: Diagnosis not present

## 2020-04-07 DIAGNOSIS — G3184 Mild cognitive impairment, so stated: Secondary | ICD-10-CM | POA: Diagnosis not present

## 2020-04-07 MED ORDER — LORAZEPAM 0.5 MG PO TABS: 0.2500 mg | ORAL_TABLET | Freq: Every day | ORAL | 0 refills | Status: DC

## 2020-04-07 MED ORDER — LORAZEPAM 0.5 MG PO TABS: 0.5000 mg | ORAL_TABLET | Freq: Every day | ORAL | 0 refills | Status: DC

## 2020-04-10 ENCOUNTER — Encounter: Payer: Self-pay | Admitting: Adult Health

## 2020-04-10 ENCOUNTER — Non-Acute Institutional Stay (SKILLED_NURSING_FACILITY): Payer: Medicare Other | Admitting: Adult Health

## 2020-04-10 DIAGNOSIS — E1159 Type 2 diabetes mellitus with other circulatory complications: Secondary | ICD-10-CM

## 2020-04-10 DIAGNOSIS — I152 Hypertension secondary to endocrine disorders: Secondary | ICD-10-CM

## 2020-04-10 DIAGNOSIS — K5909 Other constipation: Secondary | ICD-10-CM | POA: Diagnosis not present

## 2020-04-10 DIAGNOSIS — I10 Essential (primary) hypertension: Secondary | ICD-10-CM

## 2020-04-10 DIAGNOSIS — E1165 Type 2 diabetes mellitus with hyperglycemia: Secondary | ICD-10-CM

## 2020-04-10 NOTE — Progress Notes (Signed)
Location:    [facility] Room Number: 114/W Place of Service:  SNF (31)   CODE STATUS: DNR  Allergies  Allergen Reactions  . Contrast Media [Iodinated Diagnostic Agents] Anaphylaxis    Chief Complaint  Patient presents with  . Medical Management of Chronic Issues            Chronic constipation:    Hypertension associated with type 2 diabetes mellitus    Controlled type 2 diabetes mellitus with hyperglycemia without long term current use of insulin:    HPI:  She is a 86 year old long term resident of this facility being seen for the management of her chronic illnesses: Chronic constipation:    Hypertension associated with type 2 diabetes mellitus    Controlled type 2 diabetes mellitus with hyperglycemia without long term current use of insulin. There are no reports of anxiety or insomnia present. No reports of uncontrolled pain.   Past Medical History:  Diagnosis Date  . Arthritis   . Coronary artery disease    angina  . Diabetes mellitus without complication (HCC)   . Hypertension     Past Surgical History:  Procedure Laterality Date  . APPENDECTOMY    . CHOLECYSTECTOMY      Social History   Socioeconomic History  . Marital status: Widowed    Spouse name: Not on file  . Number of children: Not on file  . Years of education: Not on file  . Highest education level: Not on file  Occupational History  . Not on file  Tobacco Use  . Smoking status: Never Smoker  . Smokeless tobacco: Never Used  Vaping Use  . Vaping Use: Never used  Substance and Sexual Activity  . Alcohol use: No  . Drug use: No  . Sexual activity: Not on file  Other Topics Concern  . Not on file  Social History Narrative  . Not on file   Social Determinants of Health   Financial Resource Strain:   . Difficulty of Paying Living Expenses: Not on file  Food Insecurity:   . Worried About Programme researcher, broadcasting/film/videounning Out of Food in the Last Year: Not on file  . Ran Out of Food in the Last  Year: Not on file  Transportation Needs:   . Lack of Transportation (Medical): Not on file  . Lack of Transportation (Non-Medical): Not on file  Physical Activity:   . Days of Exercise per Week: Not on file  . Minutes of Exercise per Session: Not on file  Stress:   . Feeling of Stress : Not on file  Social Connections:   . Frequency of Communication with Friends and Family: Not on file  . Frequency of Social Gatherings with Friends and Family: Not on file  . Attends Religious Services: Not on file  . Active Member of Clubs or Organizations: Not on file  . Attends BankerClub or Organization Meetings: Not on file  . Marital Status: Not on file  Intimate Partner Violence:   . Fear of Current or Ex-Partner: Not on file  . Emotionally Abused: Not on file  . Physically Abused: Not on file  . Sexually Abused: Not on file   No family history on file.    VITAL SIGNS BP 137/75   Pulse 65   Temp 98.1 F (36.7 C) (Oral)   Resp 18   Ht 5\' 4"  (1.626 m)   Wt 160 lb (72.6 kg)   SpO2 97%   BMI 27.46 kg/m  Outpatient Encounter Medications as of 04/10/2020  Medication Sig  . acetaminophen (TYLENOL) 325 MG tablet Take 650 mg by mouth every 6 (six) hours as needed.   [name] amLODipine (NORVASC) 10 MG tablet Take 1 tablet (10 mg total) by mouth daily.  [name] atorvastatin (LIPITOR) 20 MG tablet Take 1 tablet (20 mg total) by mouth daily at 6 PM.  . Balsam [place]-Castor Oil (VENELEX) OINT Apply topically. Apply to sacrum and bilateral buttocks qshift for prevention.  [name] lisinopril (ZESTRIL) 10 MG tablet Take 10 mg by mouth daily.  [name] LORazepam (ATIVAN) 0.5 MG tablet Take 1 tablet (0.5 mg total) by mouth at bedtime.  . metFORMIN (GLUCOPHAGE) 500 MG tablet Take 1 tablet by mouth 2 (two) times daily.  . NON FORMULARY Diet Change: Regular, carbohydrate consistent with thin liquids  . NON FORMULARY Accu-check qam. Notify provider of results under 60 or over 400. Once A Day  . sertraline (ZOLOFT) 100 MG tablet Take  100 mg by mouth daily. Take along with 25 mg to = 125 mg  . sertraline (ZOLOFT) 25 MG tablet Take 25 mg by mouth daily. Take along with 100 mg to = 125 mg   No facility-administered encounter medications on file as of 04/10/2020.     SIGNIFICANT DIAGNOSTIC EXAMS   PREVIOUS   07-26-19: ct of head: 1. 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 2. Mild chronic small vessel ischemic disease.  07-27-19: ct of head:  1. Stable size of left thalamic and posterior limb internal capsule hemorrhage with increasing surrounding edema, as expected. 2. Increasing mass effect with partial effacement of the left lateral ventricle and 5 mm of midline shift. 3. Stable intraventricular hemorrhage.  07-26-18: 2-d echo:  Left Ventricle: Left ventricular ejection fraction, by visual estimation, is 65 to 70%. The left ventricle has normal function. The left ventricle is not well visualized. There is moderately increased left ventricular hypertrophy. Left ventricular  diastolic parameters are consistent with Grade I diastolic dysfunction (impaired relaxation). Elevated left ventricular end-diastolic pressure.   Right Ventricle: The right ventricular size is normal. No increase in right ventricular wall thickness. Global RV systolic function is has normal systolic function. Left Atrium: Left atrial size was normal in size. Right Atrium: Right atrial size was normal in size Pericardium: There is no evidence of pericardial effusion. Mitral Valve: The mitral valve is normal in structure. No evidence of mitral valve regurgitation. No evidence of mitral valve stenosis by observation. MV peak gradient, 7.4 mmHg. Tricuspid Valve: The tricuspid valve is normal in structure. Tricuspid valve regurgitation is trivial.   Aortic Valve: The aortic valve was not well visualized. Aortic valve regurgitation is not visualized. Mild aortic valve sclerosis is present, with no evidence of aortic valve stenosis. Aortic  valve mean gradient measures 6.0 mmHg. Aortic valve peak  gradient measures 11.0 mmHg. Aortic valve area, by VTI measures 2.05 cm.  07-28-19: ct of head: 1. Unchanged size and appearance of left thalamic intraparenchymal hematoma with intraventricular extension. 2. Unchanged minimal rightward midline shift.  07-28-19: swallow study: Dysphagia 1 (Puree) solids;Honey thick liquids   07-31-19: MRI/MRA of head:  Parenchymal hemorrhage involving the left thalamus and adjacent white matter with intraventricular extension. Associated mass effect is similar to recent CT. No hydrocephalus. Moderate chronic microvascular ischemic changes. Mild irregularity and stenosis of left P1 PCA. Otherwise unremarkable MRA of the head.  02-03-20: DEXA t score -1.009  03-14-20: ct of head and cervical spine:  1. No evidence of acute intracranial process.  2. No acute cervical spine fracture or listhesis. 3. Small soft tissue laceration at the right parietal scalp at the vertex with associated small subcutaneous hematoma and small right parietal scalp hematoma. 4. Moderate to severe multilevel degenerative disc disease of the cervical spine, most severe at C4-5 where there is a large herniated disc material. The AP diameter of the spinal canal in this region is approximately 5-6 mm. Multilevel mild neural foraminal narrowing, most severe on the left at C6-7 where there is also uncovertebral and facet arthrosis. 5. Moderate periventricular white matter changes likely reflecting the sequela of small vessel ischemia. Previously noted intraparenchymal hematoma within the left basal ganglia and intraventricular hemorrhage has resolved. There is residual encephalomalacia within the posterior limb of the left internal capsule and left thalamus.   NO NEW EXAMS.   LABS REVIEWED PREVIOUS  07-26-19: wbc 6.9; hgb 15.5; hct 45.8; mcv 91.4 plt 208; glucose 227; bun 15 creat 0.60; k+ 7.9; na++ 132; ca 8.9; liver normal albumin 3.2 hgb  a1c 6.6 07-27-19: chol 205; ldl 120; trig 166; hdl 53 07-30-14: wbc 10.3; hgb 14.3; hct 43.5; mcv 91.8 plt 211; glucose 177; bun 21; creat 0.85; k+ 3.7; na++ 139; ca 8.4 08-01-19: wbc 9.6; hgb 14.5; ht 45.1; mcv 93.8 plt 213; glucose 131; bun 21; creat 0.67; k+ 3.7; na++ 148 ca 8.6 08-02-19: wbc 9.2; hgb 14.2; hct 44.6; mcv 95.3 plt 217; glucose 199; bun 21; creat 0.62; k+ 3.6; na++ 142; ca 8.7; mag 2.0 08-03-19: wbc 11.3; hgb 14.3; hct 44.5; mcv 93.9; plt 236; glucose 162; bun 19; creat 0.68; k+ 3.7; na++ 139; ca 8.4  12-16-19: urine micro-albumin 8.3 ( on ACE)  hgb a1c 6.9 12-23-19: chol 94; ldl 33 trig 93 hdl 42 vit B 12: 371 02-03-20: wbc 7.9; hgb 13.5; hct 42.3; mcv 93.8 plt 223; glucose  129; bun 11; creat 0.44; k+ 4.1; na++ 133 ca 9.0 liver normal albumin 3.1   NO NEW LABS.    Review of Systems  Constitutional: Negative for malaise/fatigue.  Respiratory: Negative for cough and shortness of breath.   Cardiovascular: Negative for chest pain, palpitations and leg swelling.  Gastrointestinal: Negative for abdominal pain, constipation and heartburn.  Musculoskeletal: Negative for back pain, joint pain and myalgias.  Skin: Negative.   Neurological: Negative for dizziness.  Psychiatric/Behavioral: The patient is not nervous/anxious.     Physical Exam Constitutional:      General: She is not in acute distress.    Appearance: She is well-developed. She is not diaphoretic.  Neck:     Thyroid: No thyromegaly.  Cardiovascular:     Rate and Rhythm: Normal rate and regular rhythm.     Pulses: Normal pulses.     Heart sounds: Normal heart sounds.  Pulmonary:     Effort: Pulmonary effort is normal. No respiratory distress.     Breath sounds: Normal breath sounds.  Abdominal:     General: Bowel sounds are normal. There is no distension.     Palpations: Abdomen is soft.     Tenderness: There is no abdominal tenderness.  Musculoskeletal:     Cervical back: Neck supple.     Right lower leg: No  edema.     Left lower leg: No edema.     Comments: Right hemiplegia   Lymphadenopathy:     Cervical: No cervical adenopathy.  Skin:    General: Skin is warm and dry.  Neurological:     Mental Status: She is alert. Mental status is  at baseline.  Psychiatric:        Mood and Affect: Mood normal.     ASSESSMENT/ PLAN:  TODAY  1. Chronic constipation: will continue senna s twice daily   2. Hypertension associated with type 2 diabetes mellitus is stable b/p 137/75 will continue lisinopril 10 mg daily norvasc 10 mg daily   3. Controlled type 2 diabetes mellitus with hyperglycemia without long term current use of insulin: is stable hgb a1c 6.9 will continue metformin 500 mg twice daily is on ace statin not a candidate for asa    PREVIOUS  4. Moderate episode of recurrent major depression: is stable will continue zoloft 125 mg daily and ativan 0.5 mg (failed one wean) nightly   5. IVH (intraventricular hemorrhage) hemiplegia right side: is stable will monitor   6. Dysphagia due to cva: stable no signs of aspiration will continue nectar thick liquids.   7. Dyslipidemia associated with type 2 diabetes mellitus is stable LDL 33 will continue lipitor 20 mg daily     MD is aware of resident's narcotic use and is in agreement with current plan of care. We will attempt to wean resident as appropriate.  Synthia Innocent NP [facility]  Contact [phone] Monday through Friday 8am- 5pm  After hours call [phone]

## 2020-04-12 DIAGNOSIS — F4323 Adjustment disorder with mixed anxiety and depressed mood: Secondary | ICD-10-CM | POA: Diagnosis not present

## 2020-04-19 DIAGNOSIS — F331 Major depressive disorder, recurrent, moderate: Secondary | ICD-10-CM | POA: Diagnosis not present

## 2020-04-21 DIAGNOSIS — R279 Unspecified lack of coordination: Secondary | ICD-10-CM | POA: Diagnosis not present

## 2020-04-21 DIAGNOSIS — Z741 Need for assistance with personal care: Secondary | ICD-10-CM | POA: Diagnosis not present

## 2020-04-21 DIAGNOSIS — R293 Abnormal posture: Secondary | ICD-10-CM | POA: Diagnosis not present

## 2020-04-21 DIAGNOSIS — M6281 Muscle weakness (generalized): Secondary | ICD-10-CM | POA: Diagnosis not present

## 2020-04-21 DIAGNOSIS — R1312 Dysphagia, oropharyngeal phase: Secondary | ICD-10-CM | POA: Diagnosis not present

## 2020-04-21 DIAGNOSIS — I69351 Hemiplegia and hemiparesis following cerebral infarction affecting right dominant side: Secondary | ICD-10-CM | POA: Diagnosis not present

## 2020-04-21 DIAGNOSIS — R471 Dysarthria and anarthria: Secondary | ICD-10-CM | POA: Diagnosis not present

## 2020-04-21 DIAGNOSIS — I69320 Aphasia following cerebral infarction: Secondary | ICD-10-CM | POA: Diagnosis not present

## 2020-04-24 DIAGNOSIS — R471 Dysarthria and anarthria: Secondary | ICD-10-CM | POA: Diagnosis not present

## 2020-04-24 DIAGNOSIS — R293 Abnormal posture: Secondary | ICD-10-CM | POA: Diagnosis not present

## 2020-04-24 DIAGNOSIS — I69320 Aphasia following cerebral infarction: Secondary | ICD-10-CM | POA: Diagnosis not present

## 2020-04-24 DIAGNOSIS — Z741 Need for assistance with personal care: Secondary | ICD-10-CM | POA: Diagnosis not present

## 2020-04-24 DIAGNOSIS — I69351 Hemiplegia and hemiparesis following cerebral infarction affecting right dominant side: Secondary | ICD-10-CM | POA: Diagnosis not present

## 2020-04-24 DIAGNOSIS — R1312 Dysphagia, oropharyngeal phase: Secondary | ICD-10-CM | POA: Diagnosis not present

## 2020-04-25 DIAGNOSIS — R293 Abnormal posture: Secondary | ICD-10-CM | POA: Diagnosis not present

## 2020-04-25 DIAGNOSIS — I69351 Hemiplegia and hemiparesis following cerebral infarction affecting right dominant side: Secondary | ICD-10-CM | POA: Diagnosis not present

## 2020-04-25 DIAGNOSIS — Z741 Need for assistance with personal care: Secondary | ICD-10-CM | POA: Diagnosis not present

## 2020-04-25 DIAGNOSIS — R471 Dysarthria and anarthria: Secondary | ICD-10-CM | POA: Diagnosis not present

## 2020-04-25 DIAGNOSIS — I69320 Aphasia following cerebral infarction: Secondary | ICD-10-CM | POA: Diagnosis not present

## 2020-04-25 DIAGNOSIS — R1312 Dysphagia, oropharyngeal phase: Secondary | ICD-10-CM | POA: Diagnosis not present

## 2020-04-26 DIAGNOSIS — R293 Abnormal posture: Secondary | ICD-10-CM | POA: Diagnosis not present

## 2020-04-26 DIAGNOSIS — Z741 Need for assistance with personal care: Secondary | ICD-10-CM | POA: Diagnosis not present

## 2020-04-26 DIAGNOSIS — R471 Dysarthria and anarthria: Secondary | ICD-10-CM | POA: Diagnosis not present

## 2020-04-26 DIAGNOSIS — I69320 Aphasia following cerebral infarction: Secondary | ICD-10-CM | POA: Diagnosis not present

## 2020-04-26 DIAGNOSIS — I69351 Hemiplegia and hemiparesis following cerebral infarction affecting right dominant side: Secondary | ICD-10-CM | POA: Diagnosis not present

## 2020-04-26 DIAGNOSIS — R1312 Dysphagia, oropharyngeal phase: Secondary | ICD-10-CM | POA: Diagnosis not present

## 2020-04-27 ENCOUNTER — Encounter: Payer: Self-pay | Admitting: Adult Health

## 2020-04-27 ENCOUNTER — Non-Acute Institutional Stay (SKILLED_NURSING_FACILITY): Payer: Medicare Other | Admitting: Adult Health

## 2020-04-27 DIAGNOSIS — R471 Dysarthria and anarthria: Secondary | ICD-10-CM | POA: Diagnosis not present

## 2020-04-27 DIAGNOSIS — Z741 Need for assistance with personal care: Secondary | ICD-10-CM | POA: Diagnosis not present

## 2020-04-27 DIAGNOSIS — R293 Abnormal posture: Secondary | ICD-10-CM | POA: Diagnosis not present

## 2020-04-27 DIAGNOSIS — F331 Major depressive disorder, recurrent, moderate: Secondary | ICD-10-CM

## 2020-04-27 DIAGNOSIS — I615 Nontraumatic intracerebral hemorrhage, intraventricular: Secondary | ICD-10-CM

## 2020-04-27 DIAGNOSIS — R1312 Dysphagia, oropharyngeal phase: Secondary | ICD-10-CM | POA: Diagnosis not present

## 2020-04-27 DIAGNOSIS — I69351 Hemiplegia and hemiparesis following cerebral infarction affecting right dominant side: Secondary | ICD-10-CM

## 2020-04-27 DIAGNOSIS — I69320 Aphasia following cerebral infarction: Secondary | ICD-10-CM | POA: Diagnosis not present

## 2020-04-27 NOTE — Progress Notes (Signed)
Location:    [facility] Room Number: 114/W Place of Service:  SNF (31)   CODE STATUS: DNR  Allergies  Allergen Reactions  . Contrast Media [Iodinated Diagnostic Agents] Anaphylaxis    Chief Complaint  Patient presents with  . Acute Visit    Care Plan Meeting    HPI:  We have come together for her care plan meeting. BIMS 11/15 mood 5/30. She has a poor appetite; her weight is stable at 160 pounds. No falls. She requires extensive to total care for her adls; feeds self. She is incontinent of bladder and bowel. She is being followed by OT for her right upper extremity contracture. There are no reports of uncontrolled pain. She is tolerating her anxiety medication without difficulty; has failed one wean. There are no reports of insomnia present. She continues to be followed for her chronic illnesses including: Hemiplegia of right dominant side as late effect of cerebral infarction unspecified hemiplegia type  Moderate episode of major depressive disorder  IVH (intraventricular hemorrhage)  Past Medical History:  Diagnosis Date  . Arthritis   . Coronary artery disease    angina  . Diabetes mellitus without complication (HCC)   . Hypertension     Past Surgical History:  Procedure Laterality Date  . APPENDECTOMY    . CHOLECYSTECTOMY      Social History   Socioeconomic History  . Marital status: Widowed    Spouse name: Not on file  . Number of children: Not on file  . Years of education: Not on file  . Highest education level: Not on file  Occupational History  . Not on file  Tobacco Use  . Smoking status: Never Smoker  . Smokeless tobacco: Never Used  Vaping Use  . Vaping Use: Never used  Substance and Sexual Activity  . Alcohol use: No  . Drug use: No  . Sexual activity: Not on file  Other Topics Concern  . Not on file  Social History Narrative  . Not on file   Social Determinants of Health   Financial Resource Strain:   . Difficulty  of Paying Living Expenses: Not on file  Food Insecurity:   . Worried About Programme researcher, broadcasting/film/video in the Last Year: Not on file  . Ran Out of Food in the Last Year: Not on file  Transportation Needs:   . Lack of Transportation (Medical): Not on file  . Lack of Transportation (Non-Medical): Not on file  Physical Activity:   . Days of Exercise per Week: Not on file  . Minutes of Exercise per Session: Not on file  Stress:   . Feeling of Stress : Not on file  Social Connections:   . Frequency of Communication with Friends and Family: Not on file  . Frequency of Social Gatherings with Friends and Family: Not on file  . Attends Religious Services: Not on file  . Active Member of Clubs or Organizations: Not on file  . Attends Banker Meetings: Not on file  . Marital Status: Not on file  Intimate Partner Violence:   . Fear of Current or Ex-Partner: Not on file  . Emotionally Abused: Not on file  . Physically Abused: Not on file  . Sexually Abused: Not on file   No family history on file.    VITAL SIGNS BP 132/77   Pulse 62   Temp 98.1 F (36.7 C)   Resp 18   Ht 5\' 4"  (1.626 m)  Wt 163 lb 6.4 oz (74.1 kg)   BMI 28.05 kg/m   Outpatient Encounter Medications as of 04/27/2020  Medication Sig  . acetaminophen (TYLENOL) 325 MG tablet Take 650 mg by mouth every 6 (six) hours as needed.   [name] amLODipine (NORVASC) 10 MG tablet Take 1 tablet (10 mg total) by mouth daily.  [name] atorvastatin (LIPITOR) 20 MG tablet Take 1 tablet (20 mg total) by mouth daily at 6 PM.  . Balsam [place]-Castor Oil (VENELEX) OINT Apply topically. Apply to sacrum and bilateral buttocks qshift for prevention.  [name] lisinopril (ZESTRIL) 10 MG tablet Take 10 mg by mouth daily.  [name] LORazepam (ATIVAN) 0.5 MG tablet Take 1 tablet (0.5 mg total) by mouth at bedtime.  . metFORMIN (GLUCOPHAGE) 500 MG tablet Take 1 tablet by mouth 2 (two) times daily.  . NON FORMULARY Diet Change: Regular, carbohydrate consistent with  thin liquids  . NON FORMULARY Accu-check qam. Notify provider of results under 60 or over 400. Once A Day  . sertraline (ZOLOFT) 100 MG tablet Take 100 mg by mouth daily. Take along with 25 mg to = 125 mg  . sertraline (ZOLOFT) 25 MG tablet Take 25 mg by mouth daily. Take along with 100 mg to = 125 mg   No facility-administered encounter medications on file as of 04/27/2020.     SIGNIFICANT DIAGNOSTIC EXAMS   PREVIOUS   07-26-19: ct of head: 1. 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 2. Mild chronic small vessel ischemic disease.  07-27-19: ct of head:  1. Stable size of left thalamic and posterior limb internal capsule hemorrhage with increasing surrounding edema, as expected. 2. Increasing mass effect with partial effacement of the left lateral ventricle and 5 mm of midline shift. 3. Stable intraventricular hemorrhage.  07-26-18: 2-d echo:  Left Ventricle: Left ventricular ejection fraction, by visual estimation, is 65 to 70%. The left ventricle has normal function. The left ventricle is not well visualized. There is moderately increased left ventricular hypertrophy. Left ventricular  diastolic parameters are consistent with Grade I diastolic dysfunction (impaired relaxation). Elevated left ventricular end-diastolic pressure.   Right Ventricle: The right ventricular size is normal. No increase in right ventricular wall thickness. Global RV systolic function is has normal systolic function. Left Atrium: Left atrial size was normal in size. Right Atrium: Right atrial size was normal in size Pericardium: There is no evidence of pericardial effusion. Mitral Valve: The mitral valve is normal in structure. No evidence of mitral valve regurgitation. No evidence of mitral valve stenosis by observation. MV peak gradient, 7.4 mmHg. Tricuspid Valve: The tricuspid valve is normal in structure. Tricuspid valve regurgitation is trivial.   Aortic Valve: The aortic valve was not  well visualized. Aortic valve regurgitation is not visualized. Mild aortic valve sclerosis is present, with no evidence of aortic valve stenosis. Aortic valve mean gradient measures 6.0 mmHg. Aortic valve peak  gradient measures 11.0 mmHg. Aortic valve area, by VTI measures 2.05 cm.  07-28-19: ct of head: 1. Unchanged size and appearance of left thalamic intraparenchymal hematoma with intraventricular extension. 2. Unchanged minimal rightward midline shift.  07-28-19: swallow study: Dysphagia 1 (Puree) solids;Honey thick liquids   07-31-19: MRI/MRA of head:  Parenchymal hemorrhage involving the left thalamus and adjacent white matter with intraventricular extension. Associated mass effect is similar to recent CT. No hydrocephalus. Moderate chronic microvascular ischemic changes. Mild irregularity and stenosis of left P1 PCA. Otherwise unremarkable MRA of the head.  02-03-20: DEXA t score -1.009  03-14-20:  ct of head and cervical spine:  1. No evidence of acute intracranial process. 2. No acute cervical spine fracture or listhesis. 3. Small soft tissue laceration at the right parietal scalp at the vertex with associated small subcutaneous hematoma and small right parietal scalp hematoma. 4. Moderate to severe multilevel degenerative disc disease of the cervical spine, most severe at C4-5 where there is a large herniated disc material. The AP diameter of the spinal canal in this region is approximately 5-6 mm. Multilevel mild neural foraminal narrowing, most severe on the left at C6-7 where there is also uncovertebral and facet arthrosis. 5. Moderate periventricular white matter changes likely reflecting the sequela of small vessel ischemia. Previously noted intraparenchymal hematoma within the left basal ganglia and intraventricular hemorrhage has resolved. There is residual encephalomalacia within the posterior limb of the left internal capsule and left thalamus.   NO NEW EXAMS.   LABS REVIEWED  PREVIOUS  07-26-19: wbc 6.9; hgb 15.5; hct 45.8; mcv 91.4 plt 208; glucose 227; bun 15 creat 0.60; k+ 7.9; na++ 132; ca 8.9; liver normal albumin 3.2 hgb a1c 6.6 07-27-19: chol 205; ldl 120; trig 166; hdl 53 0-0-71: wbc 10.3; hgb 14.3; hct 43.5; mcv 91.8 plt 211; glucose 177; bun 21; creat 0.85; k+ 3.7; na++ 139; ca 8.4 08-01-19: wbc 9.6; hgb 14.5; ht 45.1; mcv 93.8 plt 213; glucose 131; bun 21; creat 0.67; k+ 3.7; na++ 148 ca 8.6 08-02-19: wbc 9.2; hgb 14.2; hct 44.6; mcv 95.3 plt 217; glucose 199; bun 21; creat 0.62; k+ 3.6; na++ 142; ca 8.7; mag 2.0 08-03-19: wbc 11.3; hgb 14.3; hct 44.5; mcv 93.9; plt 236; glucose 162; bun 19; creat 0.68; k+ 3.7; na++ 139; ca 8.4  12-16-19: urine micro-albumin 8.3 ( on ACE)  hgb a1c 6.9 12-23-19: chol 94; ldl 33 trig 93 hdl 42 vit B 12: 371 02-03-20: wbc 7.9; hgb 13.5; hct 42.3; mcv 93.8 plt 223; glucose  129; bun 11; creat 0.44; k+ 4.1; na++ 133 ca 9.0 liver normal albumin 3.1   NO NEW LABS.   Review of Systems  Constitutional: Negative for malaise/fatigue.  Respiratory: Negative for cough and shortness of breath.   Cardiovascular: Negative for chest pain, palpitations and leg swelling.  Gastrointestinal: Negative for abdominal pain, constipation and heartburn.  Musculoskeletal: Negative for back pain, joint pain and myalgias.  Skin: Negative.   Neurological: Negative for dizziness.  Psychiatric/Behavioral: The patient is not nervous/anxious.     Physical Exam Constitutional:      General: She is not in acute distress.    Appearance: She is well-developed. She is not diaphoretic.  Neck:     Thyroid: No thyromegaly.  Cardiovascular:     Rate and Rhythm: Normal rate and regular rhythm.     Pulses: Normal pulses.     Heart sounds: Normal heart sounds.  Pulmonary:     Effort: Pulmonary effort is normal. No respiratory distress.     Breath sounds: Normal breath sounds.  Abdominal:     General: Bowel sounds are normal. There is no distension.      Palpations: Abdomen is soft.     Tenderness: There is no abdominal tenderness.  Musculoskeletal:     Cervical back: Neck supple.     Right lower leg: No edema.     Left lower leg: No edema.     Comments: Right hemiplegia Right upper extremity contracture.   Lymphadenopathy:     Cervical: No cervical adenopathy.  Skin:    General: Skin is  warm and dry.  Neurological:     Mental Status: She is alert. Mental status is at baseline.  Psychiatric:        Mood and Affect: Mood normal.      ASSESSMENT/ PLAN:  TODAY  1. 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 Moderate episode of major depressive disorder 3. IVH (intraventricular hemorrhage)  Will continue current medications Will continue current plan of care Will continue to monitor her status.   MD is aware of resident's narcotic use and is in agreement with current plan of care. We will attempt to wean resident as appropriate.  Synthia Innocenteborah Ilo Beamon NP [facility]  Contact [phone] Monday through Friday 8am- 5pm  After hours call [phone]

## 2020-04-28 DIAGNOSIS — R471 Dysarthria and anarthria: Secondary | ICD-10-CM | POA: Diagnosis not present

## 2020-04-28 DIAGNOSIS — I69320 Aphasia following cerebral infarction: Secondary | ICD-10-CM | POA: Diagnosis not present

## 2020-04-28 DIAGNOSIS — R293 Abnormal posture: Secondary | ICD-10-CM | POA: Diagnosis not present

## 2020-04-28 DIAGNOSIS — Z741 Need for assistance with personal care: Secondary | ICD-10-CM | POA: Diagnosis not present

## 2020-04-28 DIAGNOSIS — I69351 Hemiplegia and hemiparesis following cerebral infarction affecting right dominant side: Secondary | ICD-10-CM | POA: Diagnosis not present

## 2020-04-28 DIAGNOSIS — R1312 Dysphagia, oropharyngeal phase: Secondary | ICD-10-CM | POA: Diagnosis not present

## 2020-05-01 DIAGNOSIS — R1312 Dysphagia, oropharyngeal phase: Secondary | ICD-10-CM | POA: Diagnosis not present

## 2020-05-01 DIAGNOSIS — I69351 Hemiplegia and hemiparesis following cerebral infarction affecting right dominant side: Secondary | ICD-10-CM | POA: Diagnosis not present

## 2020-05-01 DIAGNOSIS — Z741 Need for assistance with personal care: Secondary | ICD-10-CM | POA: Diagnosis not present

## 2020-05-01 DIAGNOSIS — R471 Dysarthria and anarthria: Secondary | ICD-10-CM | POA: Diagnosis not present

## 2020-05-01 DIAGNOSIS — R293 Abnormal posture: Secondary | ICD-10-CM | POA: Diagnosis not present

## 2020-05-01 DIAGNOSIS — I69320 Aphasia following cerebral infarction: Secondary | ICD-10-CM | POA: Diagnosis not present

## 2020-05-02 DIAGNOSIS — R293 Abnormal posture: Secondary | ICD-10-CM | POA: Diagnosis not present

## 2020-05-02 DIAGNOSIS — I69351 Hemiplegia and hemiparesis following cerebral infarction affecting right dominant side: Secondary | ICD-10-CM | POA: Diagnosis not present

## 2020-05-02 DIAGNOSIS — R1312 Dysphagia, oropharyngeal phase: Secondary | ICD-10-CM | POA: Diagnosis not present

## 2020-05-02 DIAGNOSIS — I69320 Aphasia following cerebral infarction: Secondary | ICD-10-CM | POA: Diagnosis not present

## 2020-05-02 DIAGNOSIS — Z741 Need for assistance with personal care: Secondary | ICD-10-CM | POA: Diagnosis not present

## 2020-05-02 DIAGNOSIS — R471 Dysarthria and anarthria: Secondary | ICD-10-CM | POA: Diagnosis not present

## 2020-05-03 DIAGNOSIS — R1312 Dysphagia, oropharyngeal phase: Secondary | ICD-10-CM | POA: Diagnosis not present

## 2020-05-03 DIAGNOSIS — I69351 Hemiplegia and hemiparesis following cerebral infarction affecting right dominant side: Secondary | ICD-10-CM | POA: Diagnosis not present

## 2020-05-03 DIAGNOSIS — Z741 Need for assistance with personal care: Secondary | ICD-10-CM | POA: Diagnosis not present

## 2020-05-03 DIAGNOSIS — I69320 Aphasia following cerebral infarction: Secondary | ICD-10-CM | POA: Diagnosis not present

## 2020-05-03 DIAGNOSIS — R293 Abnormal posture: Secondary | ICD-10-CM | POA: Diagnosis not present

## 2020-05-03 DIAGNOSIS — R471 Dysarthria and anarthria: Secondary | ICD-10-CM | POA: Diagnosis not present

## 2020-05-04 ENCOUNTER — Other Ambulatory Visit (HOSPITAL_COMMUNITY)
Admission: RE | Admit: 2020-05-04 | Discharge: 2020-05-04 | Disposition: A | Discharge status: Home / Self Care | Payer: Medicare Other | Source: Skilled Nursing Facility | Attending: Adult Health | Admitting: Adult Health

## 2020-05-04 DIAGNOSIS — Z741 Need for assistance with personal care: Secondary | ICD-10-CM | POA: Diagnosis not present

## 2020-05-04 DIAGNOSIS — E1165 Type 2 diabetes mellitus with hyperglycemia: Secondary | ICD-10-CM | POA: Insufficient documentation

## 2020-05-04 DIAGNOSIS — R293 Abnormal posture: Secondary | ICD-10-CM | POA: Diagnosis not present

## 2020-05-04 DIAGNOSIS — R1312 Dysphagia, oropharyngeal phase: Secondary | ICD-10-CM | POA: Diagnosis not present

## 2020-05-04 DIAGNOSIS — R471 Dysarthria and anarthria: Secondary | ICD-10-CM | POA: Diagnosis not present

## 2020-05-04 DIAGNOSIS — I69351 Hemiplegia and hemiparesis following cerebral infarction affecting right dominant side: Secondary | ICD-10-CM | POA: Diagnosis not present

## 2020-05-04 DIAGNOSIS — I69320 Aphasia following cerebral infarction: Secondary | ICD-10-CM | POA: Diagnosis not present

## 2020-05-05 DIAGNOSIS — I69320 Aphasia following cerebral infarction: Secondary | ICD-10-CM | POA: Diagnosis not present

## 2020-05-05 DIAGNOSIS — G3184 Mild cognitive impairment, so stated: Secondary | ICD-10-CM | POA: Diagnosis not present

## 2020-05-05 DIAGNOSIS — R471 Dysarthria and anarthria: Secondary | ICD-10-CM | POA: Diagnosis not present

## 2020-05-05 DIAGNOSIS — R293 Abnormal posture: Secondary | ICD-10-CM | POA: Diagnosis not present

## 2020-05-05 DIAGNOSIS — F411 Generalized anxiety disorder: Secondary | ICD-10-CM | POA: Diagnosis not present

## 2020-05-05 DIAGNOSIS — R1312 Dysphagia, oropharyngeal phase: Secondary | ICD-10-CM | POA: Diagnosis not present

## 2020-05-05 DIAGNOSIS — Z741 Need for assistance with personal care: Secondary | ICD-10-CM | POA: Diagnosis not present

## 2020-05-05 DIAGNOSIS — I69351 Hemiplegia and hemiparesis following cerebral infarction affecting right dominant side: Secondary | ICD-10-CM | POA: Diagnosis not present

## 2020-05-05 LAB — HEMOGLOBIN A1C
Hgb A1c MFr Bld: 6.4 % — ABNORMAL HIGH (ref 4.8–5.6)
Mean Plasma Glucose: 137 mg/dL

## 2020-05-08 DIAGNOSIS — R293 Abnormal posture: Secondary | ICD-10-CM | POA: Diagnosis not present

## 2020-05-08 DIAGNOSIS — R1312 Dysphagia, oropharyngeal phase: Secondary | ICD-10-CM | POA: Diagnosis not present

## 2020-05-08 DIAGNOSIS — I69320 Aphasia following cerebral infarction: Secondary | ICD-10-CM | POA: Diagnosis not present

## 2020-05-08 DIAGNOSIS — Z741 Need for assistance with personal care: Secondary | ICD-10-CM | POA: Diagnosis not present

## 2020-05-08 DIAGNOSIS — I69351 Hemiplegia and hemiparesis following cerebral infarction affecting right dominant side: Secondary | ICD-10-CM | POA: Diagnosis not present

## 2020-05-08 DIAGNOSIS — R471 Dysarthria and anarthria: Secondary | ICD-10-CM | POA: Diagnosis not present

## 2020-05-09 DIAGNOSIS — R1312 Dysphagia, oropharyngeal phase: Secondary | ICD-10-CM | POA: Diagnosis not present

## 2020-05-09 DIAGNOSIS — Z741 Need for assistance with personal care: Secondary | ICD-10-CM | POA: Diagnosis not present

## 2020-05-09 DIAGNOSIS — R293 Abnormal posture: Secondary | ICD-10-CM | POA: Diagnosis not present

## 2020-05-09 DIAGNOSIS — I69320 Aphasia following cerebral infarction: Secondary | ICD-10-CM | POA: Diagnosis not present

## 2020-05-09 DIAGNOSIS — I69351 Hemiplegia and hemiparesis following cerebral infarction affecting right dominant side: Secondary | ICD-10-CM | POA: Diagnosis not present

## 2020-05-09 DIAGNOSIS — R471 Dysarthria and anarthria: Secondary | ICD-10-CM | POA: Diagnosis not present

## 2020-05-10 ENCOUNTER — Other Ambulatory Visit: Payer: Self-pay | Admitting: Adult Health

## 2020-05-10 DIAGNOSIS — Z741 Need for assistance with personal care: Secondary | ICD-10-CM | POA: Diagnosis not present

## 2020-05-10 DIAGNOSIS — M2141 Flat foot [pes planus] (acquired), right foot: Secondary | ICD-10-CM | POA: Diagnosis not present

## 2020-05-10 DIAGNOSIS — E1151 Type 2 diabetes mellitus with diabetic peripheral angiopathy without gangrene: Secondary | ICD-10-CM | POA: Diagnosis not present

## 2020-05-10 DIAGNOSIS — I69320 Aphasia following cerebral infarction: Secondary | ICD-10-CM | POA: Diagnosis not present

## 2020-05-10 DIAGNOSIS — B351 Tinea unguium: Secondary | ICD-10-CM | POA: Diagnosis not present

## 2020-05-10 DIAGNOSIS — I69351 Hemiplegia and hemiparesis following cerebral infarction affecting right dominant side: Secondary | ICD-10-CM | POA: Diagnosis not present

## 2020-05-10 DIAGNOSIS — Z7984 Long term (current) use of oral hypoglycemic drugs: Secondary | ICD-10-CM | POA: Diagnosis not present

## 2020-05-10 DIAGNOSIS — M2142 Flat foot [pes planus] (acquired), left foot: Secondary | ICD-10-CM | POA: Diagnosis not present

## 2020-05-10 DIAGNOSIS — R262 Difficulty in walking, not elsewhere classified: Secondary | ICD-10-CM | POA: Diagnosis not present

## 2020-05-10 DIAGNOSIS — R471 Dysarthria and anarthria: Secondary | ICD-10-CM | POA: Diagnosis not present

## 2020-05-10 DIAGNOSIS — R1312 Dysphagia, oropharyngeal phase: Secondary | ICD-10-CM | POA: Diagnosis not present

## 2020-05-10 DIAGNOSIS — R293 Abnormal posture: Secondary | ICD-10-CM | POA: Diagnosis not present

## 2020-05-10 MED ORDER — LORAZEPAM 0.5 MG PO TABS: 0.5000 mg | ORAL_TABLET | Freq: Every day | ORAL | 0 refills | Status: DC

## 2020-05-11 DIAGNOSIS — I69320 Aphasia following cerebral infarction: Secondary | ICD-10-CM | POA: Diagnosis not present

## 2020-05-11 DIAGNOSIS — R471 Dysarthria and anarthria: Secondary | ICD-10-CM | POA: Diagnosis not present

## 2020-05-11 DIAGNOSIS — Z741 Need for assistance with personal care: Secondary | ICD-10-CM | POA: Diagnosis not present

## 2020-05-11 DIAGNOSIS — R293 Abnormal posture: Secondary | ICD-10-CM | POA: Diagnosis not present

## 2020-05-11 DIAGNOSIS — I69351 Hemiplegia and hemiparesis following cerebral infarction affecting right dominant side: Secondary | ICD-10-CM | POA: Diagnosis not present

## 2020-05-11 DIAGNOSIS — F331 Major depressive disorder, recurrent, moderate: Secondary | ICD-10-CM | POA: Diagnosis not present

## 2020-05-11 DIAGNOSIS — R1312 Dysphagia, oropharyngeal phase: Secondary | ICD-10-CM | POA: Diagnosis not present

## 2020-05-12 DIAGNOSIS — R1312 Dysphagia, oropharyngeal phase: Secondary | ICD-10-CM | POA: Diagnosis not present

## 2020-05-12 DIAGNOSIS — I69351 Hemiplegia and hemiparesis following cerebral infarction affecting right dominant side: Secondary | ICD-10-CM | POA: Diagnosis not present

## 2020-05-12 DIAGNOSIS — R293 Abnormal posture: Secondary | ICD-10-CM | POA: Diagnosis not present

## 2020-05-12 DIAGNOSIS — Z741 Need for assistance with personal care: Secondary | ICD-10-CM | POA: Diagnosis not present

## 2020-05-12 DIAGNOSIS — I69320 Aphasia following cerebral infarction: Secondary | ICD-10-CM | POA: Diagnosis not present

## 2020-05-12 DIAGNOSIS — R471 Dysarthria and anarthria: Secondary | ICD-10-CM | POA: Diagnosis not present

## 2020-05-13 DIAGNOSIS — R1312 Dysphagia, oropharyngeal phase: Secondary | ICD-10-CM | POA: Diagnosis not present

## 2020-05-13 DIAGNOSIS — Z741 Need for assistance with personal care: Secondary | ICD-10-CM | POA: Diagnosis not present

## 2020-05-13 DIAGNOSIS — R293 Abnormal posture: Secondary | ICD-10-CM | POA: Diagnosis not present

## 2020-05-13 DIAGNOSIS — R471 Dysarthria and anarthria: Secondary | ICD-10-CM | POA: Diagnosis not present

## 2020-05-13 DIAGNOSIS — I69320 Aphasia following cerebral infarction: Secondary | ICD-10-CM | POA: Diagnosis not present

## 2020-05-13 DIAGNOSIS — I69351 Hemiplegia and hemiparesis following cerebral infarction affecting right dominant side: Secondary | ICD-10-CM | POA: Diagnosis not present

## 2020-05-15 ENCOUNTER — Non-Acute Institutional Stay (SKILLED_NURSING_FACILITY): Payer: Medicare Other | Admitting: Adult Health

## 2020-05-15 ENCOUNTER — Encounter: Payer: Self-pay | Admitting: Adult Health

## 2020-05-15 DIAGNOSIS — E1151 Type 2 diabetes mellitus with diabetic peripheral angiopathy without gangrene: Secondary | ICD-10-CM | POA: Diagnosis not present

## 2020-05-15 DIAGNOSIS — F331 Major depressive disorder, recurrent, moderate: Secondary | ICD-10-CM

## 2020-05-15 DIAGNOSIS — I615 Nontraumatic intracerebral hemorrhage, intraventricular: Secondary | ICD-10-CM

## 2020-05-15 DIAGNOSIS — I69351 Hemiplegia and hemiparesis following cerebral infarction affecting right dominant side: Secondary | ICD-10-CM

## 2020-05-15 NOTE — Progress Notes (Signed)
Location:    [facility] Room Number: 114/W Place of Service:  SNF (31)   CODE STATUS: DNR  Allergies  Allergen Reactions  . Contrast Media [Iodinated Diagnostic Agents] Anaphylaxis    Chief Complaint  Patient presents with  . Medical Management of Chronic Issues           Type 2 diabetes mellitus with peripheral artery disease:   Moderate episode of recurrent major depressive disorder:   IVH (intraventricular hemorrhage) hemiplegia of right dominant side as late effect of cerebral infraction unspecified hemiplegia type    HPI:  She is a 86 year old long term resident of this facility being seen for the management of her chronic illnesses: Type 2 diabetes mellitus with peripheral artery disease:   Moderate episode of recurrent major depressive disorder:   IVH (intraventricular hemorrhage) hemiplegia of right dominant side as late effect of cerebral infraction unspecified hemiplegia type. There are no reports of uncontrolled pain; no reports of uncontrolled anxiety. No reports of changes in appetite or signs of aspiration.   Past Medical History:  Diagnosis Date  . Arthritis   . Coronary artery disease    angina  . Diabetes mellitus without complication (HCC)   . Hypertension     Past Surgical History:  Procedure Laterality Date  . APPENDECTOMY    . CHOLECYSTECTOMY      Social History   Socioeconomic History  . Marital status: Widowed    Spouse name: Not on file  . Number of children: Not on file  . Years of education: Not on file  . Highest education level: Not on file  Occupational History  . Not on file  Tobacco Use  . Smoking status: Never Smoker  . Smokeless tobacco: Never Used  Vaping Use  . Vaping Use: Never used  Substance and Sexual Activity  . Alcohol use: No  . Drug use: No  . Sexual activity: Not on file  Other Topics Concern  . Not on file  Social History Narrative  . Not on file   Social Determinants of Health    Financial Resource Strain:   . Difficulty of Paying Living Expenses: Not on file  Food Insecurity:   . Worried About Programme researcher, broadcasting/film/video in the Last Year: Not on file  . Ran Out of Food in the Last Year: Not on file  Transportation Needs:   . Lack of Transportation (Medical): Not on file  . Lack of Transportation (Non-Medical): Not on file  Physical Activity:   . Days of Exercise per Week: Not on file  . Minutes of Exercise per Session: Not on file  Stress:   . Feeling of Stress : Not on file  Social Connections:   . Frequency of Communication with Friends and Family: Not on file  . Frequency of Social Gatherings with Friends and Family: Not on file  . Attends Religious Services: Not on file  . Active Member of Clubs or Organizations: Not on file  . Attends Banker Meetings: Not on file  . Marital Status: Not on file  Intimate Partner Violence:   . Fear of Current or Ex-Partner: Not on file  . Emotionally Abused: Not on file  . Physically Abused: Not on file  . Sexually Abused: Not on file   No family history on file.    VITAL SIGNS BP 128/72   Pulse 65   Temp 98.1 F (36.7 C)   Resp 18   Ht 5'  4" (1.626 m)   Wt 163 lb 6.4 oz (74.1 kg)   BMI 28.05 kg/m   Outpatient Encounter Medications as of 05/15/2020  Medication Sig  . acetaminophen (TYLENOL) 325 MG tablet Take 650 mg by mouth every 6 (six) hours as needed.   [name] amLODipine (NORVASC) 10 MG tablet Take 1 tablet (10 mg total) by mouth daily.  [name] atorvastatin (LIPITOR) 20 MG tablet Take 1 tablet (20 mg total) by mouth daily at 6 PM.  . Balsam [place]-Castor Oil (VENELEX) OINT Apply topically. Apply to sacrum and bilateral buttocks qshift for prevention.  [name] lisinopril (ZESTRIL) 10 MG tablet Take 10 mg by mouth daily.  [name] LORazepam (ATIVAN) 0.5 MG tablet Take 1 tablet (0.5 mg total) by mouth at bedtime.  . metFORMIN (GLUCOPHAGE) 500 MG tablet Take 1 tablet by mouth 2 (two) times daily.  . NON FORMULARY Diet  Change: Regular, carbohydrate consistent with thin liquids  . NON FORMULARY Accu-check qam. Notify provider of results under 60 or over 400. Once A Day  . sertraline (ZOLOFT) 100 MG tablet Take 100 mg by mouth daily. Take along with 25 mg to = 125 mg  . sertraline (ZOLOFT) 25 MG tablet Take 25 mg by mouth daily. Take along with 100 mg to = 125 mg   No facility-administered encounter medications on file as of 05/15/2020.     SIGNIFICANT DIAGNOSTIC EXAMS   PREVIOUS   07-26-19: ct of head: 1. 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 2. Mild chronic small vessel ischemic disease.  07-27-19: ct of head:  1. Stable size of left thalamic and posterior limb internal capsule hemorrhage with increasing surrounding edema, as expected. 2. Increasing mass effect with partial effacement of the left lateral ventricle and 5 mm of midline shift. 3. Stable intraventricular hemorrhage.  07-26-18: 2-d echo:  Left Ventricle: Left ventricular ejection fraction, by visual estimation, is 65 to 70%. The left ventricle has normal function. The left ventricle is not well visualized. There is moderately increased left ventricular hypertrophy. Left ventricular  diastolic parameters are consistent with Grade I diastolic dysfunction (impaired relaxation). Elevated left ventricular end-diastolic pressure.   Right Ventricle: The right ventricular size is normal. No increase in right ventricular wall thickness. Global RV systolic function is has normal systolic function. Left Atrium: Left atrial size was normal in size. Right Atrium: Right atrial size was normal in size Pericardium: There is no evidence of pericardial effusion. Mitral Valve: The mitral valve is normal in structure. No evidence of mitral valve regurgitation. No evidence of mitral valve stenosis by observation. MV peak gradient, 7.4 mmHg. Tricuspid Valve: The tricuspid valve is normal in structure. Tricuspid valve regurgitation is  trivial.   Aortic Valve: The aortic valve was not well visualized. Aortic valve regurgitation is not visualized. Mild aortic valve sclerosis is present, with no evidence of aortic valve stenosis. Aortic valve mean gradient measures 6.0 mmHg. Aortic valve peak  gradient measures 11.0 mmHg. Aortic valve area, by VTI measures 2.05 cm.  07-28-19: ct of head: 1. Unchanged size and appearance of left thalamic intraparenchymal hematoma with intraventricular extension. 2. Unchanged minimal rightward midline shift.  07-28-19: swallow study: Dysphagia 1 (Puree) solids;Honey thick liquids   07-31-19: MRI/MRA of head:  Parenchymal hemorrhage involving the left thalamus and adjacent white matter with intraventricular extension. Associated mass effect is similar to recent CT. No hydrocephalus. Moderate chronic microvascular ischemic changes. Mild irregularity and stenosis of left P1 PCA. Otherwise unremarkable MRA of the head.  02-03-20: DEXA  t score -1.009  03-14-20: ct of head and cervical spine:  1. No evidence of acute intracranial process. 2. No acute cervical spine fracture or listhesis. 3. Small soft tissue laceration at the right parietal scalp at the vertex with associated small subcutaneous hematoma and small right parietal scalp hematoma. 4. Moderate to severe multilevel degenerative disc disease of the cervical spine, most severe at C4-5 where there is a large herniated disc material. The AP diameter of the spinal canal in this region is approximately 5-6 mm. Multilevel mild neural foraminal narrowing, most severe on the left at C6-7 where there is also uncovertebral and facet arthrosis. 5. Moderate periventricular white matter changes likely reflecting the sequela of small vessel ischemia. Previously noted intraparenchymal hematoma within the left basal ganglia and intraventricular hemorrhage has resolved. There is residual encephalomalacia within the posterior limb of the left internal capsule and  left thalamus.   NO NEW EXAMS.   LABS REVIEWED PREVIOUS  07-26-19: wbc 6.9; hgb 15.5; hct 45.8; mcv 91.4 plt 208; glucose 227; bun 15 creat 0.60; k+ 7.9; na++ 132; ca 8.9; liver normal albumin 3.2 hgb a1c 6.6 07-27-19: chol 205; ldl 120; trig 166; hdl 53 8-0-99: wbc 10.3; hgb 14.3; hct 43.5; mcv 91.8 plt 211; glucose 177; bun 21; creat 0.85; k+ 3.7; na++ 139; ca 8.4 08-01-19: wbc 9.6; hgb 14.5; ht 45.1; mcv 93.8 plt 213; glucose 131; bun 21; creat 0.67; k+ 3.7; na++ 148 ca 8.6 08-02-19: wbc 9.2; hgb 14.2; hct 44.6; mcv 95.3 plt 217; glucose 199; bun 21; creat 0.62; k+ 3.6; na++ 142; ca 8.7; mag 2.0 08-03-19: wbc 11.3; hgb 14.3; hct 44.5; mcv 93.9; plt 236; glucose 162; bun 19; creat 0.68; k+ 3.7; na++ 139; ca 8.4  12-16-19: urine micro-albumin 8.3 ( on ACE)  hgb a1c 6.9 12-23-19: chol 94; ldl 33 trig 93 hdl 42 vit B 12: 371 02-03-20: wbc 7.9; hgb 13.5; hct 42.3; mcv 93.8 plt 223; glucose  129; bun 11; creat 0.44; k+ 4.1; na++ 133 ca 9.0 liver normal albumin 3.1   NO NEW LABS.    Review of Systems  Constitutional: Negative for malaise/fatigue.  Respiratory: Negative for cough and shortness of breath.   Cardiovascular: Negative for chest pain, palpitations and leg swelling.  Gastrointestinal: Negative for abdominal pain, constipation and heartburn.  Musculoskeletal: Negative for back pain, joint pain and myalgias.  Skin: Negative.   Neurological: Negative for dizziness.  Psychiatric/Behavioral: The patient is not nervous/anxious.      Physical Exam Constitutional:      General: She is not in acute distress.    Appearance: She is well-developed. She is not diaphoretic.  Neck:     Thyroid: No thyromegaly.  Cardiovascular:     Rate and Rhythm: Normal rate and regular rhythm.     Pulses: Normal pulses.     Heart sounds: Normal heart sounds.  Pulmonary:     Effort: Pulmonary effort is normal. No respiratory distress.     Breath sounds: Normal breath sounds.  Abdominal:     General: Bowel  sounds are normal. There is no distension.     Palpations: Abdomen is soft.     Tenderness: There is no abdominal tenderness.  Musculoskeletal:     Cervical back: Neck supple.     Right lower leg: No edema.     Left lower leg: No edema.     Comments: Right hemiplegia Right upper extremity contracture.  Lymphadenopathy:     Cervical: No cervical adenopathy.  Skin:  General: Skin is warm and dry.  Neurological:     Mental Status: She is alert. Mental status is at baseline.  Psychiatric:        Mood and Affect: Mood normal.      ASSESSMENT/ PLAN:  TODAY  1. Type 2 diabetes mellitus with peripheral artery disease: hgb a1c 6.9  Will continue metformin 500 mg twice daily   Is on ace statin; no asa   2. Moderate episode of recurrent major depressive disorder: is stable will continue zoloft 125 mg daily and ativan 0.5 mg nightly (failed wean from ativan one time)  3. IVH (intraventricular hemorrhage) hemiplegia of right dominant side as late effect of cerebral infraction unspecified hemiplegia type   is stable will monitor   PREVIOUS  4. Dysphagia due to cva: stable no signs of aspiration will continue thin liquids.   5. Dyslipidemia associated with type 2 diabetes mellitus is stable LDL 33 will continue lipitor 20 mg daily   6. Chronic constipation: will continue senna s twice daily   7. Hypertension associated with type 2 diabetes mellitus is stable b/p 128/72 will continue lisinopril 10 mg daily norvasc 10 mg daily       MD is aware of resident's narcotic use and is in agreement with current plan of care. We will attempt to wean resident as appropriate.  [name] Innocenteborah Darlyn Repsher NP [facility]  Contact [phone] Monday through Friday 8am- 5pm  After hours call [phone]

## 2020-05-17 DIAGNOSIS — E1151 Type 2 diabetes mellitus with diabetic peripheral angiopathy without gangrene: Secondary | ICD-10-CM | POA: Insufficient documentation

## 2020-06-01 DIAGNOSIS — F331 Major depressive disorder, recurrent, moderate: Secondary | ICD-10-CM | POA: Diagnosis not present

## 2020-06-06 ENCOUNTER — Other Ambulatory Visit: Payer: Self-pay | Admitting: Adult Health

## 2020-06-06 MED ORDER — LORAZEPAM 0.5 MG PO TABS: 0.5000 mg | ORAL_TABLET | Freq: Every day | ORAL | 0 refills | Status: DC

## 2020-06-13 ENCOUNTER — Encounter: Payer: Self-pay | Admitting: Adult Health

## 2020-06-13 ENCOUNTER — Non-Acute Institutional Stay (SKILLED_NURSING_FACILITY): Payer: Medicare Other | Admitting: Adult Health

## 2020-06-13 DIAGNOSIS — I69391 Dysphagia following cerebral infarction: Secondary | ICD-10-CM

## 2020-06-13 DIAGNOSIS — F331 Major depressive disorder, recurrent, moderate: Secondary | ICD-10-CM | POA: Diagnosis not present

## 2020-06-13 DIAGNOSIS — E1169 Type 2 diabetes mellitus with other specified complication: Secondary | ICD-10-CM

## 2020-06-13 DIAGNOSIS — E785 Hyperlipidemia, unspecified: Secondary | ICD-10-CM

## 2020-06-13 DIAGNOSIS — K5909 Other constipation: Secondary | ICD-10-CM

## 2020-06-13 NOTE — Progress Notes (Signed)
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 There are no reports of uncontrolled pain; no reports of agitation. Her anxiety is managed with her current regimen.    Past Medical History:  Diagnosis Date   Arthritis    Coronary artery disease    angina   Diabetes mellitus without complication (HCC)    Hypertension     Past Surgical History:  Procedure Laterality Date   APPENDECTOMY     CHOLECYSTECTOMY      Social History   Socioeconomic History   Marital status: Widowed    Spouse name: Not on file   Number of children: Not on file   Years of education: Not on file   Highest education level: Not on file  Occupational History   Not on file  Tobacco Use   Smoking status: Never Smoker   Smokeless tobacco: Never Used  Vaping Use   Vaping Use: Never used  Substance and Sexual Activity   Alcohol use: No   Drug use: No   Sexual activity: Not on file  Other Topics Concern   Not on file  Social History Narrative   Not on file   Social Determinants of Health   Financial Resource Strain:    Difficulty of Paying Living Expenses: Not on file  Food Insecurity:    Worried About Running Out of Food in the Last Year: Not on file   Ran Out of Food in the Last Year: Not on file  Transportation Needs:    Lack of Transportation (Medical): Not on file   Lack of Transportation  (Non-Medical): Not on file  Physical Activity:    Days of Exercise per Week: Not on file   Minutes of Exercise per Session: Not on file  Stress:    Feeling of Stress : Not on file  Social Connections:    Frequency of Communication with Friends and Family: Not on file   Frequency of Social Gatherings with Friends and Family: Not on file   Attends Religious Services: Not on file   Active Member of Clubs or Organizations: Not on file   Attends Banker Meetings: Not on file   Marital Status: Not on file  Intimate Partner Violence:    Fear of Current or Ex-Partner: Not on file   Emotionally Abused: Not on file   Physically Abused: Not on file   Sexually Abused: Not on file   History reviewed. No pertinent family history.    VITAL SIGNS BP 132/63    Pulse 64    Temp 98.4 F (36.9 C)    Resp 20    Ht 5\' 4"  (1.626 m)    Wt 163 lb (73.9 kg)    SpO2 97%    BMI 27.98 kg/m   Outpatient Encounter Medications as of 06/13/2020  Medication Sig   acetaminophen (TYLENOL) 325 MG  tablet Take 650 mg by mouth every 6 (six) hours as needed.    amLODipine (NORVASC) 10 MG tablet Take 1 tablet (10 mg total) by mouth daily.   atorvastatin (LIPITOR) 20 MG tablet Take 1 tablet (20 mg total) by mouth daily at 6 PM.   Balsam [place]-Castor Oil (VENELEX) OINT Apply topically. Apply to sacrum and bilateral buttocks qshift for prevention.   lisinopril (ZESTRIL) 10 MG tablet Take 10 mg by mouth daily.   LORazepam (ATIVAN) 0.5 MG tablet Take 1 tablet (0.5 mg total) by mouth at bedtime.   metFORMIN (GLUCOPHAGE) 500 MG tablet Take 1 tablet by mouth 2 (two) times daily.   NON FORMULARY Diet Change: Regular, carbohydrate consistent with thin liquids   NON FORMULARY Accu-check qam. Notify provider of results under 60 or over 400. Once A Day   sertraline (ZOLOFT) 100 MG tablet Take 100 mg by mouth daily. Take along with 25 mg to = 125 mg   sertraline (ZOLOFT) 25 MG tablet Take 25 mg  by mouth daily. Take along with 100 mg to = 125 mg   No facility-administered encounter medications on file as of 06/13/2020.     SIGNIFICANT DIAGNOSTIC EXAMS   PREVIOUS   07-26-19: ct of head: 1. 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 2. Mild chronic small vessel ischemic disease.  07-27-19: ct of head:  1. Stable size of left thalamic and posterior limb internal capsule hemorrhage with increasing surrounding edema, as expected. 2. Increasing mass effect with partial effacement of the left lateral ventricle and 5 mm of midline shift. 3. Stable intraventricular hemorrhage.  07-26-18: 2-d echo:  Left Ventricle: Left ventricular ejection fraction, by visual estimation, is 65 to 70%. The left ventricle has normal function. The left ventricle is not well visualized. There is moderately increased left ventricular hypertrophy. Left ventricular  diastolic parameters are consistent with Grade I diastolic dysfunction (impaired relaxation). Elevated left ventricular end-diastolic pressure.   Right Ventricle: The right ventricular size is normal. No increase in right ventricular wall thickness. Global RV systolic function is has normal systolic function. Left Atrium: Left atrial size was normal in size. Right Atrium: Right atrial size was normal in size Pericardium: There is no evidence of pericardial effusion. Mitral Valve: The mitral valve is normal in structure. No evidence of mitral valve regurgitation. No evidence of mitral valve stenosis by observation. MV peak gradient, 7.4 mmHg. Tricuspid Valve: The tricuspid valve is normal in structure. Tricuspid valve regurgitation is trivial.   Aortic Valve: The aortic valve was not well visualized. Aortic valve regurgitation is not visualized. Mild aortic valve sclerosis is present, with no evidence of aortic valve stenosis. Aortic valve mean gradient measures 6.0 mmHg. Aortic valve peak  gradient measures 11.0 mmHg. Aortic valve  area, by VTI measures 2.05 cm.  07-28-19: ct of head: 1. Unchanged size and appearance of left thalamic intraparenchymal hematoma with intraventricular extension. 2. Unchanged minimal rightward midline shift.  07-28-19: swallow study: Dysphagia 1 (Puree) solids;Honey thick liquids   07-31-19: MRI/MRA of head:  Parenchymal hemorrhage involving the left thalamus and adjacent white matter with intraventricular extension. Associated mass effect is similar to recent CT. No hydrocephalus. Moderate chronic microvascular ischemic changes. Mild irregularity and stenosis of left P1 PCA. Otherwise unremarkable MRA of the head.  02-03-20: DEXA t score -1.009  03-14-20: ct of head and cervical spine:  1. No evidence of acute intracranial process. 2. No acute cervical spine fracture or listhesis. 3. Small soft tissue laceration at the  right parietal scalp at the vertex with associated small subcutaneous hematoma and small right parietal scalp hematoma. 4. Moderate to severe multilevel degenerative disc disease of the cervical spine, most severe at C4-5 where there is a large herniated disc material. The AP diameter of the spinal canal in this region is approximately 5-6 mm. Multilevel mild neural foraminal narrowing, most severe on the left at C6-7 where there is also uncovertebral and facet arthrosis. 5. Moderate periventricular white matter changes likely reflecting the sequela of small vessel ischemia. Previously noted intraparenchymal hematoma within the left basal ganglia and intraventricular hemorrhage has resolved. There is residual encephalomalacia within the posterior limb of the left internal capsule and left thalamus.   NO NEW EXAMS.   LABS REVIEWED PREVIOUS  07-26-19: wbc 6.9; hgb 15.5; hct 45.8; mcv 91.4 plt 208; glucose 227; bun 15 creat 0.60; k+ 7.9; na++ 132; ca 8.9; liver normal albumin 3.2 hgb a1c 6.6 07-27-19: chol 205; ldl 120; trig 166; hdl 53 07-30-27: wbc 10.3; hgb 14.3; hct 43.5; mcv 91.8  plt 211; glucose 177; bun 21; creat 0.85; k+ 3.7; na++ 139; ca 8.4 08-01-19: wbc 9.6; hgb 14.5; ht 45.1; mcv 93.8 plt 213; glucose 131; bun 21; creat 0.67; k+ 3.7; na++ 148 ca 8.6 08-02-19: wbc 9.2; hgb 14.2; hct 44.6; mcv 95.3 plt 217; glucose 199; bun 21; creat 0.62; k+ 3.6; na++ 142; ca 8.7; mag 2.0 08-03-19: wbc 11.3; hgb 14.3; hct 44.5; mcv 93.9; plt 236; glucose 162; bun 19; creat 0.68; k+ 3.7; na++ 139; ca 8.4  12-16-19: urine micro-albumin 8.3 ( on ACE)  hgb a1c 6.9 12-23-19: chol 94; ldl 33 trig 93 hdl 42 vit B 12: 371 02-03-20: wbc 7.9; hgb 13.5; hct 42.3; mcv 93.8 plt 223; glucose  129; bun 11; creat 0.44; k+ 4.1; na++ 133 ca 9.0 liver normal albumin 3.1   TODAY  05-04-20: hgb a1c 6.4     Review of Systems  Constitutional: Negative for malaise/fatigue.  Respiratory: Negative for cough and shortness of breath.   Cardiovascular: Negative for chest pain, palpitations and leg swelling.  Gastrointestinal: Negative for abdominal pain, constipation and heartburn.  Musculoskeletal: Negative for back pain, joint pain and myalgias.  Skin: Negative.   Neurological: Negative for dizziness.  Psychiatric/Behavioral: The patient is not nervous/anxious.     Physical Exam Constitutional:      General: She is not in acute distress.    Appearance: She is well-developed. She is not diaphoretic.  Neck:     Thyroid: No thyromegaly.  Cardiovascular:     Rate and Rhythm: Normal rate and regular rhythm.     Pulses: Normal pulses.     Heart sounds: Normal heart sounds.  Pulmonary:     Effort: Pulmonary effort is normal. No respiratory distress.     Breath sounds: Normal breath sounds.  Abdominal:     General: Bowel sounds are normal. There is no distension.     Palpations: Abdomen is soft.     Tenderness: There is no abdominal tenderness.  Musculoskeletal:     Cervical back: Neck supple.     Right lower leg: No edema.     Left lower leg: No edema.     Comments: Right hemiplegia Right upper  extremity contracture  Lymphadenopathy:     Cervical: No cervical adenopathy.  Skin:    General: Skin is warm and dry.  Neurological:     Mental Status: She is alert. Mental status is at baseline.  Psychiatric:  Mood and Affect: Mood normal.      ASSESSMENT/ PLAN:  TODAY  1. 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 Dyslipidemia associated with type 2 diabetes mellitus: is stable LDL 33 will continue lipitor 20 mg daily  3. Chronic constipation: is stable will continue senna s twice daily   PREVIOUS  4. Hypertension associated with type 2 diabetes mellitus is stable b/p 123/63 will continue lisinopril 10 mg daily norvasc 10 mg daily   5. Type 2 diabetes mellitus with peripheral artery disease: hgb a1c 6.4  Will continue metformin 500 mg twice daily   Is on ace statin; no asa   6. Moderate episode of recurrent major depressive disorder: is stable will continue zoloft 125 mg daily and ativan 0.5 mg nightly (failed wean from ativan one time)  7. 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 We will attempt to wean resident as appropriate.  Synthia Innocenteborah Maebell Lyvers NP [facility]  Contact [phone] Monday through Friday 8am- 5pm  After hours call [phone]

## 2020-06-20 DIAGNOSIS — Z1159 Encounter for screening for other viral diseases: Secondary | ICD-10-CM | POA: Diagnosis not present

## 2020-06-20 DIAGNOSIS — I610 Nontraumatic intracerebral hemorrhage in hemisphere, subcortical: Secondary | ICD-10-CM | POA: Diagnosis not present

## 2020-06-20 DIAGNOSIS — I69351 Hemiplegia and hemiparesis following cerebral infarction affecting right dominant side: Secondary | ICD-10-CM | POA: Diagnosis not present

## 2020-06-26 ENCOUNTER — Non-Acute Institutional Stay (SKILLED_NURSING_FACILITY): Payer: Medicare Other | Admitting: Adult Health

## 2020-06-26 ENCOUNTER — Encounter: Payer: Self-pay | Admitting: Adult Health

## 2020-06-26 DIAGNOSIS — R21 Rash and other nonspecific skin eruption: Secondary | ICD-10-CM

## 2020-06-26 NOTE — Progress Notes (Signed)
Location:    [facility] Room Number: 114/W Place of Service:  SNF (31)   CODE STATUS: DNR  Allergies  Allergen Reactions  . Contrast Media [Iodinated Diagnostic Agents] Anaphylaxis    Chief Complaint  Patient presents with  . Acute Visit    Left Great Toe Pain    HPI:  She has been having pain in her left foot for the past several days. She states that she has a rash on the top of her left foot which itches. She denies pain in her toes at this time. She has not tried anything on her foot.   Past Medical History:  Diagnosis Date  . Arthritis   . Coronary artery disease    angina  . Diabetes mellitus without complication (HCC)   . Hypertension     Past Surgical History:  Procedure Laterality Date  . APPENDECTOMY    . CHOLECYSTECTOMY      Social History   Socioeconomic History  . Marital status: Widowed    Spouse name: Not on file  . Number of children: Not on file  . Years of education: Not on file  . Highest education level: Not on file  Occupational History  . Not on file  Tobacco Use  . Smoking status: Never Smoker  . Smokeless tobacco: Never Used  Vaping Use  . Vaping Use: Never used  Substance and Sexual Activity  . Alcohol use: No  . Drug use: No  . Sexual activity: Not on file  Other Topics Concern  . Not on file  Social History Narrative  . Not on file   Social Determinants of Health   Financial Resource Strain:   . Difficulty of Paying Living Expenses: Not on file  Food Insecurity:   . Worried About Programme researcher, broadcasting/film/video in the Last Year: Not on file  . Ran Out of Food in the Last Year: Not on file  Transportation Needs:   . Lack of Transportation (Medical): Not on file  . Lack of Transportation (Non-Medical): Not on file  Physical Activity:   . Days of Exercise per Week: Not on file  . Minutes of Exercise per Session: Not on file  Stress:   . Feeling of Stress : Not on file  Social Connections:   . Frequency of  Communication with Friends and Family: Not on file  . Frequency of Social Gatherings with Friends and Family: Not on file  . Attends Religious Services: Not on file  . Active Member of Clubs or Organizations: Not on file  . Attends Banker Meetings: Not on file  . Marital Status: Not on file  Intimate Partner Violence:   . Fear of Current or Ex-Partner: Not on file  . Emotionally Abused: Not on file  . Physically Abused: Not on file  . Sexually Abused: Not on file   History reviewed. No pertinent family history.    VITAL SIGNS BP (!) 126/49   Pulse 67   Temp (!) 97.5 F (36.4 C)   Ht 5\' 4"  (1.626 m)   Wt 161 lb (73 kg)   BMI 27.64 kg/m   Outpatient Encounter Medications as of 06/26/2020  Medication Sig  . acetaminophen (TYLENOL) 325 MG tablet Take 650 mg by mouth every 6 (six) hours as needed.   14/12/2019 amLODipine (NORVASC) 10 MG tablet Take 1 tablet (10 mg total) by mouth daily.  [name] atorvastatin (LIPITOR) 20 MG tablet Take 1 tablet (20 mg total) by  mouth daily at 6 PM.  . Balsam [place]-Castor Oil (VENELEX) OINT Apply topically. Apply to sacrum and bilateral buttocks qshift for prevention.  [name]. lisinopril (ZESTRIL) 10 MG tablet Take 10 mg by mouth daily.  [name]. LORazepam (ATIVAN) 0.5 MG tablet Take 1 tablet (0.5 mg total) by mouth at bedtime.  . metFORMIN (GLUCOPHAGE) 500 MG tablet Take 1 tablet by mouth 2 (two) times daily.  . NON FORMULARY Diet Change: Regular, carbohydrate consistent with thin liquids  . NON FORMULARY Accu-check qam. Notify provider of results under 60 or over 400. Once A Day  . sertraline (ZOLOFT) 100 MG tablet Take 100 mg by mouth daily. Take along with 25 mg to = 125 mg  . sertraline (ZOLOFT) 25 MG tablet Take 25 mg by mouth daily. Take along with 100 mg to = 125 mg   No facility-administered encounter medications on file as of 06/26/2020.     SIGNIFICANT DIAGNOSTIC EXAMS  PREVIOUS   07-26-19: ct of head: 1. Acute left thalamic hemorrhage with  intraventricular extension. 2. Mild chronic small vessel ischemic disease.  07-27-19: ct of head:  1. Stable size of left thalamic and posterior limb internal capsule hemorrhage with increasing surrounding edema, as expected. 2. Increasing mass effect with partial effacement of the left lateral ventricle and 5 mm of midline shift. 3. Stable intraventricular hemorrhage.  07-26-18: 2-d echo:  Left Ventricle: Left ventricular ejection fraction, by visual estimation, is 65 to 70%. The left ventricle has normal function. The left ventricle is not well visualized. There is moderately increased left ventricular hypertrophy. Left ventricular  diastolic parameters are consistent with Grade I diastolic dysfunction (impaired relaxation). Elevated left ventricular end-diastolic pressure.   Right Ventricle: The right ventricular size is normal. No increase in right ventricular wall thickness. Global RV systolic function is has normal systolic function. Left Atrium: Left atrial size was normal in size. Right Atrium: Right atrial size was normal in size Pericardium: There is no evidence of pericardial effusion. Mitral Valve: The mitral valve is normal in structure. No evidence of mitral valve regurgitation. No evidence of mitral valve stenosis by observation. MV peak gradient, 7.4 mmHg. Tricuspid Valve: The tricuspid valve is normal in structure. Tricuspid valve regurgitation is trivial.   Aortic Valve: The aortic valve was not well visualized. Aortic valve regurgitation is not visualized. Mild aortic valve sclerosis is present, with no evidence of aortic valve stenosis. Aortic valve mean gradient measures 6.0 mmHg. Aortic valve peak  gradient measures 11.0 mmHg. Aortic valve area, by VTI measures 2.05 cm.  07-28-19: ct of head: 1. Unchanged size and appearance of left thalamic intraparenchymal hematoma with intraventricular extension. 2. Unchanged minimal rightward midline shift.  07-28-19: swallow study: Dysphagia  1 (Puree) solids;Honey thick liquids   07-31-19: MRI/MRA of head:  Parenchymal hemorrhage involving the left thalamus and adjacent white matter with intraventricular extension. Associated mass effect is similar to recent CT. No hydrocephalus. Moderate chronic microvascular ischemic changes. Mild irregularity and stenosis of left P1 PCA. Otherwise unremarkable MRA of the head.  02-03-20: DEXA t score -1.009  03-14-20: ct of head and cervical spine:  1. No evidence of acute intracranial process. 2. No acute cervical spine fracture or listhesis. 3. Small soft tissue laceration at the right parietal scalp at the vertex with associated small subcutaneous hematoma and small right parietal scalp hematoma. 4. Moderate to severe multilevel degenerative disc disease of the cervical spine, most severe at C4-5 where there is a large herniated disc material. The AP diameter  of the spinal canal in this region is approximately 5-6 mm. Multilevel mild neural foraminal narrowing, most severe on the left at C6-7 where there is also uncovertebral and facet arthrosis. 5. Moderate periventricular white matter changes likely reflecting the sequela of small vessel ischemia. Previously noted intraparenchymal hematoma within the left basal ganglia and intraventricular hemorrhage has resolved. There is residual encephalomalacia within the posterior limb of the left internal capsule and left thalamus.   NO NEW EXAMS.   LABS REVIEWED PREVIOUS  07-26-19: wbc 6.9; hgb 15.5; hct 45.8; mcv 91.4 plt 208; glucose 227; bun 15 creat 0.60; k+ 7.9; na++ 132; ca 8.9; liver normal albumin 3.2 hgb a1c 6.6 07-27-19: chol 205; ldl 120; trig 166; hdl 53 3-0-16: wbc 10.3; hgb 14.3; hct 43.5; mcv 91.8 plt 211; glucose 177; bun 21; creat 0.85; k+ 3.7; na++ 139; ca 8.4 08-01-19: wbc 9.6; hgb 14.5; ht 45.1; mcv 93.8 plt 213; glucose 131; bun 21; creat 0.67; k+ 3.7; na++ 148 ca 8.6 08-02-19: wbc 9.2; hgb 14.2; hct 44.6; mcv 95.3 plt 217; glucose 199;  bun 21; creat 0.62; k+ 3.6; na++ 142; ca 8.7; mag 2.0 08-03-19: wbc 11.3; hgb 14.3; hct 44.5; mcv 93.9; plt 236; glucose 162; bun 19; creat 0.68; k+ 3.7; na++ 139; ca 8.4  12-16-19: urine micro-albumin 8.3 ( on ACE)  hgb a1c 6.9 12-23-19: chol 94; ldl 33 trig 93 hdl 42 vit B 12: 371 02-03-20: wbc 7.9; hgb 13.5; hct 42.3; mcv 93.8 plt 223; glucose  129; bun 11; creat 0.44; k+ 4.1; na++ 133 ca 9.0 liver normal albumin 3.1  05-04-20: hgb a1c 6.4    NO NEW LABS.   Review of Systems  Constitutional: Negative for malaise/fatigue.  Respiratory: Negative for cough and shortness of breath.   Cardiovascular: Negative for chest pain, palpitations and leg swelling.  Gastrointestinal: Negative for abdominal pain, constipation and heartburn.  Musculoskeletal: Negative for back pain, joint pain and myalgias.  Skin: Positive for rash.  Neurological: Negative for dizziness.  Psychiatric/Behavioral: The patient is not nervous/anxious.     Physical Exam Constitutional:      General: She is not in acute distress.    Appearance: She is well-developed. She is not diaphoretic.  Neck:     Thyroid: No thyromegaly.  Cardiovascular:     Rate and Rhythm: Normal rate and regular rhythm.     Pulses: Normal pulses.     Heart sounds: Normal heart sounds.  Pulmonary:     Effort: Pulmonary effort is normal. No respiratory distress.     Breath sounds: Normal breath sounds.  Abdominal:     General: Bowel sounds are normal. There is no distension.     Palpations: Abdomen is soft.     Tenderness: There is no abdominal tenderness.  Musculoskeletal:     Cervical back: Neck supple.     Right lower leg: No edema.     Left lower leg: No edema.     Comments: Right hemiplegia Right upper extremity contracture   Lymphadenopathy:     Cervical: No cervical adenopathy.  Skin:    General: Skin is warm and dry.     Comments: Has a rash on there left foot: is red blotchy without inflammation.   Neurological:     Mental  Status: She is alert. Mental status is at baseline.  Psychiatric:        Mood and Affect: Mood normal.       ASSESSMENT/ PLAN:  TODAY  1. Rash on left  foot: is worse will begin hydrocortisone 1% to foot twice daily and will monitor   MD is aware of resident's narcotic use and is in agreement with current plan of care. We will attempt to wean resident as appropriate.  Synthia Innocent NP [facility]  Contact [phone] Monday through Friday 8am- 5pm  After hours call [phone]

## 2020-06-28 DIAGNOSIS — R279 Unspecified lack of coordination: Secondary | ICD-10-CM | POA: Diagnosis not present

## 2020-06-28 DIAGNOSIS — I69351 Hemiplegia and hemiparesis following cerebral infarction affecting right dominant side: Secondary | ICD-10-CM | POA: Diagnosis not present

## 2020-06-29 DIAGNOSIS — F331 Major depressive disorder, recurrent, moderate: Secondary | ICD-10-CM | POA: Diagnosis not present

## 2020-06-30 DIAGNOSIS — F411 Generalized anxiety disorder: Secondary | ICD-10-CM | POA: Diagnosis not present

## 2020-06-30 DIAGNOSIS — G3184 Mild cognitive impairment, so stated: Secondary | ICD-10-CM | POA: Diagnosis not present

## 2020-07-04 ENCOUNTER — Encounter: Payer: Self-pay | Admitting: Adult Health

## 2020-07-04 ENCOUNTER — Other Ambulatory Visit: Payer: Self-pay | Admitting: Adult Health

## 2020-07-04 ENCOUNTER — Non-Acute Institutional Stay: Payer: Self-pay | Admitting: Adult Health

## 2020-07-04 DIAGNOSIS — F331 Major depressive disorder, recurrent, moderate: Secondary | ICD-10-CM

## 2020-07-04 MED ORDER — LORAZEPAM 0.5 MG PO TABS: 0.2500 mg | ORAL_TABLET | Freq: Every day | ORAL | 0 refills | Status: DC

## 2020-07-04 NOTE — Progress Notes (Signed)
Location:  [facility] Room Number: 114-W Place of Service:  SNF (31)   CODE STATUS: DNR  Allergies  Allergen Reactions   Contrast Media [Iodinated Diagnostic Agents] Anaphylaxis    Chief Complaint  Patient presents with   Acute Visit    Medication review     HPI:  She is presently taking ativan 0.5 mg nightly has failed one week. Is also taking zoloft 125 mg daily. She tells me that she is anxious; but denies this anxiety to staff members. There are no reports of insomnia present. No reports of agitation. She is due for a GDR: will attempt to lower ativan. She does have anxiety will need to address her anxiety without narcotics.    Past Medical History:  Diagnosis Date   Arthritis    Coronary artery disease    angina   Diabetes mellitus without complication (HCC)    Hypertension     Past Surgical History:  Procedure Laterality Date   APPENDECTOMY     CHOLECYSTECTOMY      Social History   Socioeconomic History   Marital status: Widowed    Spouse name: Not on file   Number of children: Not on file   Years of education: Not on file   Highest education level: Not on file  Occupational History   Not on file  Tobacco Use   Smoking status: Never Smoker   Smokeless tobacco: Never Used  Vaping Use   Vaping Use: Never used  Substance and Sexual Activity   Alcohol use: No   Drug use: No   Sexual activity: Not on file  Other Topics Concern   Not on file  Social History Narrative   Not on file   Social Determinants of Health   Financial Resource Strain: Not on file  Food Insecurity: Not on file  Transportation Needs: Not on file  Physical Activity: Not on file  Stress: Not on file  Social Connections: Not on file  Intimate Partner Violence: Not on file   History reviewed. No pertinent family history.    VITAL SIGNS BP (!) 118/50    Pulse 63    Temp 98.3 F (36.8 C)    Resp 20    Ht 5\' 4"  (1.626 m)    Wt 161  lb (73 kg)    SpO2 97%    BMI 27.64 kg/m   Outpatient Encounter Medications as of 07/04/2020  Medication Sig   acetaminophen (TYLENOL) 325 MG tablet Take 650 mg by mouth every 6 (six) hours as needed.    amLODipine (NORVASC) 10 MG tablet Take 1 tablet (10 mg total) by mouth daily.   atorvastatin (LIPITOR) 20 MG tablet Take 1 tablet (20 mg total) by mouth daily at 6 PM.   Balsam [place]-Castor Oil (VENELEX) OINT Apply topically. Apply to sacrum and bilateral buttocks qshift for prevention.   lisinopril (ZESTRIL) 10 MG tablet Take 10 mg by mouth daily.   LORazepam (ATIVAN) 0.5 MG tablet Take 1 tablet (0.5 mg total) by mouth at bedtime.   metFORMIN (GLUCOPHAGE) 500 MG tablet Take 1 tablet by mouth 2 (two) times daily.   NON FORMULARY Diet Change: Regular, carbohydrate consistent with thin liquids   NON FORMULARY Accu-check qam. Notify provider of results under 60 or over 400. Once A Day   sertraline (ZOLOFT) 100 MG tablet Take 100 mg by mouth daily. Take along with 25 mg to = 125 mg   sertraline (ZOLOFT) 25 MG tablet Take 25 mg  by mouth daily. Take along with 100 mg to = 125 mg   No facility-administered encounter medications on file as of 07/04/2020.     SIGNIFICANT DIAGNOSTIC EXAMS  PREVIOUS   07-26-19: ct of head: 1. 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 2. Mild chronic small vessel ischemic disease.  07-27-19: ct of head:  1. Stable size of left thalamic and posterior limb internal capsule hemorrhage with increasing surrounding edema, as expected. 2. Increasing mass effect with partial effacement of the left lateral ventricle and 5 mm of midline shift. 3. Stable intraventricular hemorrhage.  07-26-18: 2-d echo:  Left Ventricle: Left ventricular ejection fraction, by visual estimation, is 65 to 70%. The left ventricle has normal function. The left ventricle is not well visualized. There is moderately increased left ventricular hypertrophy. Left  ventricular  diastolic parameters are consistent with Grade I diastolic dysfunction (impaired relaxation). Elevated left ventricular end-diastolic pressure.   Right Ventricle: The right ventricular size is normal. No increase in right ventricular wall thickness. Global RV systolic function is has normal systolic function. Left Atrium: Left atrial size was normal in size. Right Atrium: Right atrial size was normal in size Pericardium: There is no evidence of pericardial effusion. Mitral Valve: The mitral valve is normal in structure. No evidence of mitral valve regurgitation. No evidence of mitral valve stenosis by observation. MV peak gradient, 7.4 mmHg. Tricuspid Valve: The tricuspid valve is normal in structure. Tricuspid valve regurgitation is trivial.   Aortic Valve: The aortic valve was not well visualized. Aortic valve regurgitation is not visualized. Mild aortic valve sclerosis is present, with no evidence of aortic valve stenosis. Aortic valve mean gradient measures 6.0 mmHg. Aortic valve peak  gradient measures 11.0 mmHg. Aortic valve area, by VTI measures 2.05 cm.  07-28-19: ct of head: 1. Unchanged size and appearance of left thalamic intraparenchymal hematoma with intraventricular extension. 2. Unchanged minimal rightward midline shift.  07-28-19: swallow study: Dysphagia 1 (Puree) solids;Honey thick liquids   07-31-19: MRI/MRA of head:  Parenchymal hemorrhage involving the left thalamus and adjacent white matter with intraventricular extension. Associated mass effect is similar to recent CT. No hydrocephalus. Moderate chronic microvascular ischemic changes. Mild irregularity and stenosis of left P1 PCA. Otherwise unremarkable MRA of the head.  02-03-20: DEXA t score -1.009  03-14-20: ct of head and cervical spine:  1. No evidence of acute intracranial process. 2. No acute cervical spine fracture or listhesis. 3. Small soft tissue laceration at the right parietal scalp at the vertex  with associated small subcutaneous hematoma and small right parietal scalp hematoma. 4. Moderate to severe multilevel degenerative disc disease of the cervical spine, most severe at C4-5 where there is a large herniated disc material. The AP diameter of the spinal canal in this region is approximately 5-6 mm. Multilevel mild neural foraminal narrowing, most severe on the left at C6-7 where there is also uncovertebral and facet arthrosis. 5. Moderate periventricular white matter changes likely reflecting the sequela of small vessel ischemia. Previously noted intraparenchymal hematoma within the left basal ganglia and intraventricular hemorrhage has resolved. There is residual encephalomalacia within the posterior limb of the left internal capsule and left thalamus.   NO NEW EXAMS.   LABS REVIEWED PREVIOUS  07-26-19: wbc 6.9; hgb 15.5; hct 45.8; mcv 91.4 plt 208; glucose 227; bun 15 creat 0.60; k+ 7.9; na++ 132; ca 8.9; liver normal albumin 3.2 hgb a1c 6.6 07-27-19: chol 205; ldl 120; trig 166; hdl 53 11-22-18: wbc 10.3; hgb 14.3; hct  43.5; mcv 91.8 plt 211; glucose 177; bun 21; creat 0.85; k+ 3.7; na++ 139; ca 8.4 08-01-19: wbc 9.6; hgb 14.5; ht 45.1; mcv 93.8 plt 213; glucose 131; bun 21; creat 0.67; k+ 3.7; na++ 148 ca 8.6 08-02-19: wbc 9.2; hgb 14.2; hct 44.6; mcv 95.3 plt 217; glucose 199; bun 21; creat 0.62; k+ 3.6; na++ 142; ca 8.7; mag 2.0 08-03-19: wbc 11.3; hgb 14.3; hct 44.5; mcv 93.9; plt 236; glucose 162; bun 19; creat 0.68; k+ 3.7; na++ 139; ca 8.4  12-16-19: urine micro-albumin 8.3 ( on ACE)  hgb a1c 6.9 12-23-19: chol 94; ldl 33 trig 93 hdl 42 vit B 12: 371 02-03-20: wbc 7.9; hgb 13.5; hct 42.3; mcv 93.8 plt 223; glucose  129; bun 11; creat 0.44; k+ 4.1; na++ 133 ca 9.0 liver normal albumin 3.1  05-04-20: hgb a1c 6.4    NO NEW LABS.    Review of Systems  Constitutional: Negative for malaise/fatigue.  Respiratory: Negative for cough and shortness of breath.   Cardiovascular: Negative for  chest pain, palpitations and leg swelling.  Gastrointestinal: Negative for abdominal pain, constipation and heartburn.  Musculoskeletal: Negative for back pain, joint pain and myalgias.  Skin: Negative.   Neurological: Negative for dizziness.  Psychiatric/Behavioral: The patient is nervous/anxious.     Physical Exam Constitutional:      General: She is not in acute distress.    Appearance: She is well-developed and well-nourished. She is not diaphoretic.  Neck:     Thyroid: No thyromegaly.  Cardiovascular:     Rate and Rhythm: Normal rate and regular rhythm.     Pulses: Normal pulses and intact distal pulses.     Heart sounds: Normal heart sounds.  Pulmonary:     Effort: Pulmonary effort is normal. No respiratory distress.     Breath sounds: Normal breath sounds.  Abdominal:     General: Bowel sounds are normal. There is no distension.     Palpations: Abdomen is soft.     Tenderness: There is no abdominal tenderness.  Musculoskeletal:        General: No edema.     Cervical back: Neck supple.     Right lower leg: No edema.     Left lower leg: No edema.     Comments:  Right hemiplegia Right upper extremity contracture    Lymphadenopathy:     Cervical: No cervical adenopathy.  Skin:    General: Skin is warm and dry.  Neurological:     Mental Status: She is alert. Mental status is at baseline.  Psychiatric:        Mood and Affect: Mood and affect and mood normal.       ASSESSMENT/ PLAN:  TODAY  1. Moderate episode of recurrent major depressive disorder She is without change in status.  Will begin buspar 5 mg three times daily  Will lower her ativan to 0.25 mg nightly Will continue to monitor her response   MD is aware of resident's narcotic use and is in agreement with current plan of care. We will attempt to wean resident as appropriate.  Synthia Innocent NP [facility]  Contact [phone] Monday through Friday 8am- 5pm  After hours call  [phone]

## 2020-07-05 ENCOUNTER — Other Ambulatory Visit: Payer: Self-pay | Admitting: Adult Health

## 2020-07-05 MED ORDER — LORAZEPAM 0.5 MG PO TABS: 0.2500 mg | ORAL_TABLET | Freq: Every day | ORAL | 0 refills | Status: DC

## 2020-07-06 DIAGNOSIS — F331 Major depressive disorder, recurrent, moderate: Secondary | ICD-10-CM | POA: Diagnosis not present

## 2020-07-13 ENCOUNTER — Non-Acute Institutional Stay (SKILLED_NURSING_FACILITY): Payer: Medicare Other | Admitting: Adult Health

## 2020-07-13 ENCOUNTER — Encounter: Payer: Self-pay | Admitting: Adult Health

## 2020-07-13 DIAGNOSIS — E1159 Type 2 diabetes mellitus with other circulatory complications: Secondary | ICD-10-CM | POA: Diagnosis not present

## 2020-07-13 DIAGNOSIS — I152 Hypertension secondary to endocrine disorders: Secondary | ICD-10-CM | POA: Diagnosis not present

## 2020-07-13 DIAGNOSIS — F331 Major depressive disorder, recurrent, moderate: Secondary | ICD-10-CM | POA: Diagnosis not present

## 2020-07-13 DIAGNOSIS — E1151 Type 2 diabetes mellitus with diabetic peripheral angiopathy without gangrene: Secondary | ICD-10-CM | POA: Diagnosis not present

## 2020-07-13 NOTE — Progress Notes (Signed)
Location:  [facility] Room Number: 114-W Place of Service:  SNF (31)   CODE STATUS: DNR  Allergies  Allergen Reactions   Contrast Media [Iodinated Diagnostic Agents] Anaphylaxis    Chief Complaint  Patient presents with   Medical Management of Chronic Issues           Hypertension associated with type 2 diabetes mellitus   Type 2 diabetes mellitus with peripheral artery disease:  Moderate episode of current major depressive disorder    HPI:  She is a 86 year old long term resident of this facility being seen for the management of her chronic illnesses:  Hypertension associated with type 2 diabetes mellitus   Type 2 diabetes mellitus with peripheral artery disease:  Moderate episode of current major depressive disorder. There are no reports of uncontrolled pain; no reports of insomnia. She does not like the wean off the ativan; but is tolerating this wean. There are no reports of changes in appetite.   Past Medical History:  Diagnosis Date   Arthritis    Coronary artery disease    angina   Diabetes mellitus without complication (HCC)    Hypertension     Past Surgical History:  Procedure Laterality Date   APPENDECTOMY     CHOLECYSTECTOMY      Social History   Socioeconomic History   Marital status: Widowed    Spouse name: Not on file   Number of children: Not on file   Years of education: Not on file   Highest education level: Not on file  Occupational History   Not on file  Tobacco Use   Smoking status: Never Smoker   Smokeless tobacco: Never Used  Vaping Use   Vaping Use: Never used  Substance and Sexual Activity   Alcohol use: No   Drug use: No   Sexual activity: Not on file  Other Topics Concern   Not on file  Social History Narrative   Not on file   Social Determinants of Health   Financial Resource Strain: Not on file  Food Insecurity: Not on file  Transportation Needs: Not on file  Physical Activity:  Not on file  Stress: Not on file  Social Connections: Not on file  Intimate Partner Violence: Not on file   History reviewed. No pertinent family history.    VITAL SIGNS BP 131/62    Pulse (!) 57    Temp 98.1 F (36.7 C)    Resp 20    Ht 5\' 4"  (1.626 m)    Wt 161 lb (73 kg)    SpO2 97%    BMI 27.64 kg/m   Outpatient Encounter Medications as of 07/13/2020  Medication Sig   acetaminophen (TYLENOL) 325 MG tablet Take 650 mg by mouth every 6 (six) hours as needed.    amLODipine (NORVASC) 10 MG tablet Take 1 tablet (10 mg total) by mouth daily.   atorvastatin (LIPITOR) 20 MG tablet Take 1 tablet (20 mg total) by mouth daily at 6 PM.   Balsam [place]-Castor Oil (VENELEX) OINT Apply topically. Apply to sacrum and bilateral buttocks qshift for prevention.   busPIRone (BUSPAR) 5 MG tablet Take 5 mg by mouth 3 (three) times daily.   lisinopril (ZESTRIL) 10 MG tablet Take 10 mg by mouth daily.   LORazepam (ATIVAN) 0.5 MG tablet Take 0.5 tablets (0.25 mg total) by mouth at bedtime.   metFORMIN (GLUCOPHAGE) 500 MG tablet Take 1 tablet by mouth 2 (two) times daily.  NON FORMULARY Diet Change: Regular, carbohydrate consistent with thin liquids   NON FORMULARY Accu-check qam. Notify provider of results under 60 or over 400. Once A Day   sertraline (ZOLOFT) 100 MG tablet Take 100 mg by mouth daily. Take along with 25 mg to = 125 mg   sertraline (ZOLOFT) 25 MG tablet Take 25 mg by mouth daily. Take along with 100 mg to = 125 mg   No facility-administered encounter medications on file as of 07/13/2020.     SIGNIFICANT DIAGNOSTIC EXAMS  PREVIOUS   07-26-19: ct of head: 1. 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 2. Mild chronic small vessel ischemic disease.  07-27-19: ct of head:  1. Stable size of left thalamic and posterior limb internal capsule hemorrhage with increasing surrounding edema, as expected. 2. Increasing mass effect with partial effacement of the  left lateral ventricle and 5 mm of midline shift. 3. Stable intraventricular hemorrhage.  07-26-18: 2-d echo:  Left Ventricle: Left ventricular ejection fraction, by visual estimation, is 65 to 70%. The left ventricle has normal function. The left ventricle is not well visualized. There is moderately increased left ventricular hypertrophy. Left ventricular  diastolic parameters are consistent with Grade I diastolic dysfunction (impaired relaxation). Elevated left ventricular end-diastolic pressure.   Right Ventricle: The right ventricular size is normal. No increase in right ventricular wall thickness. Global RV systolic function is has normal systolic function. Left Atrium: Left atrial size was normal in size. Right Atrium: Right atrial size was normal in size Pericardium: There is no evidence of pericardial effusion. Mitral Valve: The mitral valve is normal in structure. No evidence of mitral valve regurgitation. No evidence of mitral valve stenosis by observation. MV peak gradient, 7.4 mmHg. Tricuspid Valve: The tricuspid valve is normal in structure. Tricuspid valve regurgitation is trivial.   Aortic Valve: The aortic valve was not well visualized. Aortic valve regurgitation is not visualized. Mild aortic valve sclerosis is present, with no evidence of aortic valve stenosis. Aortic valve mean gradient measures 6.0 mmHg. Aortic valve peak  gradient measures 11.0 mmHg. Aortic valve area, by VTI measures 2.05 cm.  07-28-19: ct of head: 1. Unchanged size and appearance of left thalamic intraparenchymal hematoma with intraventricular extension. 2. Unchanged minimal rightward midline shift.  07-28-19: swallow study: Dysphagia 1 (Puree) solids;Honey thick liquids   07-31-19: MRI/MRA of head:  Parenchymal hemorrhage involving the left thalamus and adjacent white matter with intraventricular extension. Associated mass effect is similar to recent CT. No hydrocephalus. Moderate chronic microvascular  ischemic changes. Mild irregularity and stenosis of left P1 PCA. Otherwise unremarkable MRA of the head.  02-03-20: DEXA t score -1.009  03-14-20: ct of head and cervical spine:  1. No evidence of acute intracranial process. 2. No acute cervical spine fracture or listhesis. 3. Small soft tissue laceration at the right parietal scalp at the vertex with associated small subcutaneous hematoma and small right parietal scalp hematoma. 4. Moderate to severe multilevel degenerative disc disease of the cervical spine, most severe at C4-5 where there is a large herniated disc material. The AP diameter of the spinal canal in this region is approximately 5-6 mm. Multilevel mild neural foraminal narrowing, most severe on the left at C6-7 where there is also uncovertebral and facet arthrosis. 5. Moderate periventricular white matter changes likely reflecting the sequela of small vessel ischemia. Previously noted intraparenchymal hematoma within the left basal ganglia and intraventricular hemorrhage has resolved. There is residual encephalomalacia within the posterior limb of the left internal  capsule and left thalamus.   NO NEW EXAMS.   LABS REVIEWED PREVIOUS  07-26-19: wbc 6.9; hgb 15.5; hct 45.8; mcv 91.4 plt 208; glucose 227; bun 15 creat 0.60; k+ 7.9; na++ 132; ca 8.9; liver normal albumin 3.2 hgb a1c 6.6 07-27-19: chol 205; ldl 120; trig 166; hdl 53 [id number]: wbc 10.3; hgb 14.3; hct 43.5; mcv 91.8 plt 211; glucose 177; bun 21; creat 0.85; k+ 3.7; na++ 139; ca 8.4 08-01-19: wbc 9.6; hgb 14.5; ht 45.1; mcv 93.8 plt 213; glucose 131; bun 21; creat 0.67; k+ 3.7; na++ 148 ca 8.6 08-02-19: wbc 9.2; hgb 14.2; hct 44.6; mcv 95.3 plt 217; glucose 199; bun 21; creat 0.62; k+ 3.6; na++ 142; ca 8.7; mag 2.0 08-03-19: wbc 11.3; hgb 14.3; hct 44.5; mcv 93.9; plt 236; glucose 162; bun 19; creat 0.68; k+ 3.7; na++ 139; ca 8.4  12-16-19: urine micro-albumin 8.3 ( on ACE)  hgb a1c 6.9 12-23-19: chol 94; ldl 33 trig 93 hdl 42 vit B  12: 371 02-03-20: wbc 7.9; hgb 13.5; hct 42.3; mcv 93.8 plt 223; glucose  129; bun 11; creat 0.44; k+ 4.1; na++ 133 ca 9.0 liver normal albumin 3.1   TODAY  05-04-20: hgb a1c 6.4    Review of Systems  Constitutional: Negative for malaise/fatigue.  Respiratory: Negative for cough and shortness of breath.   Cardiovascular: Negative for chest pain, palpitations and leg swelling.  Gastrointestinal: Negative for abdominal pain, constipation and heartburn.  Musculoskeletal: Negative for back pain, joint pain and myalgias.  Skin: Negative.   Neurological: Negative for dizziness.  Psychiatric/Behavioral: The patient is nervous/anxious.      Physical Exam Constitutional:      General: She is not in acute distress.    Appearance: She is well-developed and well-nourished. She is not diaphoretic.  Neck:     Thyroid: No thyromegaly.  Cardiovascular:     Rate and Rhythm: Normal rate and regular rhythm.     Pulses: Normal pulses and intact distal pulses.     Heart sounds: Normal heart sounds.  Pulmonary:     Effort: Pulmonary effort is normal. No respiratory distress.     Breath sounds: Normal breath sounds.  Abdominal:     General: Bowel sounds are normal. There is no distension.     Palpations: Abdomen is soft.     Tenderness: There is no abdominal tenderness.  Musculoskeletal:        General: No edema.     Cervical back: Neck supple.     Right lower leg: No edema.     Left lower leg: No edema.     Comments: Right hemiplegia Right upper extremity contracture     Lymphadenopathy:     Cervical: No cervical adenopathy.  Skin:    General: Skin is warm and dry.  Neurological:     Mental Status: She is alert. Mental status is at baseline.  Psychiatric:        Mood and Affect: Mood and affect and mood normal.       ASSESSMENT/ PLAN:  TODAY  1. Hypertension associated with type 2 diabetes mellitus is stable b/p 123/63 will continue lisinopril 10 mg daily norvasc 10 mg daily    2. Type 2 diabetes mellitus with peripheral artery disease: hgb a1c 6.4 will continue metformin 500 mg twice daily is on ace statin no asa   3. Moderate episode of current major depressive disorder is stable will continue zoloft 125 mg daily ativan 0.25 mg daily (has failed one  wean in the past) buspar 5 mg three times daily goal is to increase buspar as indicated and to stop ativan.   PREVIOUS  4. 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 Dysphagia due to CVA: is stable no signs of aspiration; will continue thin liquids  6. Dyslipidemia associated with type 2 diabetes mellitus: is stable LDL 33 will continue lipitor 20 mg daily  7. Chronic constipation: is stable will continue senna s twice daily        MD is aware of resident's narcotic use and is in agreement with current plan of care. We will attempt to wean resident as appropriate.  Synthia Innocent NP [facility]  Contact [phone] Monday through Friday 8am- 5pm  After hours call [phone]

## 2020-07-19 DIAGNOSIS — I69351 Hemiplegia and hemiparesis following cerebral infarction affecting right dominant side: Secondary | ICD-10-CM | POA: Diagnosis not present

## 2020-07-19 DIAGNOSIS — I610 Nontraumatic intracerebral hemorrhage in hemisphere, subcortical: Secondary | ICD-10-CM | POA: Diagnosis not present

## 2020-07-19 DIAGNOSIS — Z1159 Encounter for screening for other viral diseases: Secondary | ICD-10-CM | POA: Diagnosis not present

## 2020-07-24 DIAGNOSIS — I69351 Hemiplegia and hemiparesis following cerebral infarction affecting right dominant side: Secondary | ICD-10-CM | POA: Diagnosis not present

## 2020-07-24 DIAGNOSIS — I610 Nontraumatic intracerebral hemorrhage in hemisphere, subcortical: Secondary | ICD-10-CM | POA: Diagnosis not present

## 2020-07-24 DIAGNOSIS — Z1159 Encounter for screening for other viral diseases: Secondary | ICD-10-CM | POA: Diagnosis not present

## 2020-07-27 DIAGNOSIS — F331 Major depressive disorder, recurrent, moderate: Secondary | ICD-10-CM | POA: Diagnosis not present

## 2020-07-31 ENCOUNTER — Other Ambulatory Visit: Payer: Self-pay | Admitting: Adult Health

## 2020-07-31 ENCOUNTER — Non-Acute Institutional Stay (SKILLED_NURSING_FACILITY): Payer: Medicare Other | Admitting: Adult Health

## 2020-07-31 ENCOUNTER — Encounter: Payer: Self-pay | Admitting: Adult Health

## 2020-07-31 DIAGNOSIS — B029 Zoster without complications: Secondary | ICD-10-CM

## 2020-07-31 MED ORDER — LORAZEPAM 0.5 MG PO TABS: 0.2500 mg | ORAL_TABLET | Freq: Every day | ORAL | 0 refills | Status: DC

## 2020-07-31 NOTE — Progress Notes (Signed)
Location:  [facility] Room Number: 114/W Place of Service:  SNF (31)   CODE STATUS: DNR  Allergies  Allergen Reactions  . Contrast Media [Iodinated Diagnostic Agents] Anaphylaxis    Chief Complaint  Patient presents with  . Acute Visit    Rash on left foot    HPI: Staff report that she has a red papular rash on her left outer ankle. She tells me that the rash does not hurt; but does itch. She states that the rash has been there for the past several days. She is complaining of insomnia; and feeling anxious at night. . She is wanting her ativan dose increased. We discussed her treatment options and she is willing to try melatonin and an increased dose of buspar at night. We did discuss her rash; more than likely this is a shingles rash.    Past Medical History:  Diagnosis Date  . Arthritis   . Coronary artery disease    angina  . Diabetes mellitus without complication (HCC)   . Hypertension     Past Surgical History:  Procedure Laterality Date  . APPENDECTOMY    . CHOLECYSTECTOMY      Social History   Socioeconomic History  . Marital status: Widowed    Spouse name: Not on file  . Number of children: Not on file  . Years of education: Not on file  . Highest education level: Not on file  Occupational History  . Not on file  Tobacco Use  . Smoking status: Never Smoker  . Smokeless tobacco: Never Used  Vaping Use  . Vaping Use: Never used  Substance and Sexual Activity  . Alcohol use: No  . Drug use: No  . Sexual activity: Not on file  Other Topics Concern  . Not on file  Social History Narrative  . Not on file   Social Determinants of Health   Financial Resource Strain: Not on file  Food Insecurity: Not on file  Transportation Needs: Not on file  Physical Activity: Not on file  Stress: Not on file  Social Connections: Not on file  Intimate Partner Violence: Not on file   History reviewed. No pertinent family  history.    VITAL SIGNS BP 125/68   Pulse 62   Temp 98.1 F (36.7 C)   Ht 5\' 4"  (1.626 m)   Wt 160 lb 11.2 oz (72.9 kg)   BMI 27.58 kg/m   Outpatient Encounter Medications as of 07/31/2020  Medication Sig  . acetaminophen (TYLENOL) 325 MG tablet Take 650 mg by mouth every 6 (six) hours as needed.   09/28/2020 amLODipine (NORVASC) 10 MG tablet Take 1 tablet (10 mg total) by mouth daily.  [name] atorvastatin (LIPITOR) 20 MG tablet Take 1 tablet (20 mg total) by mouth daily at 6 PM.  . Balsam [place]-Castor Oil (VENELEX) OINT Apply topically. Apply to sacrum and bilateral buttocks qshift for prevention.  . busPIRone (BUSPAR) 7.5 MG tablet Take 7.5 mg by mouth 3 (three) times daily. For Anxiety  . lisinopril (ZESTRIL) 10 MG tablet Take 10 mg by mouth daily.  [name] LORazepam (ATIVAN) 0.5 MG tablet Take 0.5 tablets (0.25 mg total) by mouth at bedtime.  . melatonin 3 MG TABS tablet Take 3 mg by mouth at bedtime.  . metFORMIN (GLUCOPHAGE) 500 MG tablet Take 1 tablet by mouth 2 (two) times daily.  . NON FORMULARY Diet Change: Regular, carbohydrate consistent with thin liquids  . NON FORMULARY Accu-check qam. Notify provider of  results under 60 or over 400. Once A Day  . sertraline (ZOLOFT) 100 MG tablet Take 100 mg by mouth daily. Take along with 25 mg to = 125 mg  . sertraline (ZOLOFT) 25 MG tablet Take 25 mg by mouth daily. Take along with 100 mg to = 125 mg  . valACYclovir (VALTREX) 1000 MG tablet Take 1,000 mg by mouth every 8 (eight) hours. For Shingles rash on left foot  . [DISCONTINUED] busPIRone (BUSPAR) 5 MG tablet Take 5 mg by mouth 3 (three) times daily.   No facility-administered encounter medications on file as of 07/31/2020.     SIGNIFICANT DIAGNOSTIC EXAMS   PREVIOUS   07-26-19: ct of head: 1. 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 2. Mild chronic small vessel ischemic disease.  07-27-19: ct of head:  1. Stable size of left thalamic and posterior limb internal capsule  hemorrhage with increasing surrounding edema, as expected. 2. Increasing mass effect with partial effacement of the left lateral ventricle and 5 mm of midline shift. 3. Stable intraventricular hemorrhage.  07-26-18: 2-d echo:  Left Ventricle: Left ventricular ejection fraction, by visual estimation, is 65 to 70%. The left ventricle has normal function. The left ventricle is not well visualized. There is moderately increased left ventricular hypertrophy. Left ventricular  diastolic parameters are consistent with Grade I diastolic dysfunction (impaired relaxation). Elevated left ventricular end-diastolic pressure.   Right Ventricle: The right ventricular size is normal. No increase in right ventricular wall thickness. Global RV systolic function is has normal systolic function. Left Atrium: Left atrial size was normal in size. Right Atrium: Right atrial size was normal in size Pericardium: There is no evidence of pericardial effusion. Mitral Valve: The mitral valve is normal in structure. No evidence of mitral valve regurgitation. No evidence of mitral valve stenosis by observation. MV peak gradient, 7.4 mmHg. Tricuspid Valve: The tricuspid valve is normal in structure. Tricuspid valve regurgitation is trivial.   Aortic Valve: The aortic valve was not well visualized. Aortic valve regurgitation is not visualized. Mild aortic valve sclerosis is present, with no evidence of aortic valve stenosis. Aortic valve mean gradient measures 6.0 mmHg. Aortic valve peak  gradient measures 11.0 mmHg. Aortic valve area, by VTI measures 2.05 cm.  07-28-19: ct of head: 1. Unchanged size and appearance of left thalamic intraparenchymal hematoma with intraventricular extension. 2. Unchanged minimal rightward midline shift.  07-28-19: swallow study: Dysphagia 1 (Puree) solids;Honey thick liquids   07-31-19: MRI/MRA of head:  Parenchymal hemorrhage involving the left thalamus and adjacent white matter with intraventricular  extension. Associated mass effect is similar to recent CT. No hydrocephalus. Moderate chronic microvascular ischemic changes. Mild irregularity and stenosis of left P1 PCA. Otherwise unremarkable MRA of the head.  02-03-20: DEXA t score -1.009  03-14-20: ct of head and cervical spine:  1. No evidence of acute intracranial process. 2. No acute cervical spine fracture or listhesis. 3. Small soft tissue laceration at the right parietal scalp at the vertex with associated small subcutaneous hematoma and small right parietal scalp hematoma. 4. Moderate to severe multilevel degenerative disc disease of the cervical spine, most severe at C4-5 where there is a large herniated disc material. The AP diameter of the spinal canal in this region is approximately 5-6 mm. Multilevel mild neural foraminal narrowing, most severe on the left at C6-7 where there is also uncovertebral and facet arthrosis. 5. Moderate periventricular white matter changes likely reflecting the sequela of small vessel ischemia. Previously noted intraparenchymal hematoma within  the left basal ganglia and intraventricular hemorrhage has resolved. There is residual encephalomalacia within the posterior limb of the left internal capsule and left thalamus.   NO NEW EXAMS.   LABS REVIEWED PREVIOUS  07-26-19: wbc 6.9; hgb 15.5; hct 45.8; mcv 91.4 plt 208; glucose 227; bun 15 creat 0.60; k+ 7.9; na++ 132; ca 8.9; liver normal albumin 3.2 hgb a1c 6.6 07-27-19: chol 205; ldl 120; trig 166; hdl 53 02-20-00: wbc 10.3; hgb 14.3; hct 43.5; mcv 91.8 plt 211; glucose 177; bun 21; creat 0.85; k+ 3.7; na++ 139; ca 8.4 08-01-19: wbc 9.6; hgb 14.5; ht 45.1; mcv 93.8 plt 213; glucose 131; bun 21; creat 0.67; k+ 3.7; na++ 148 ca 8.6 08-02-19: wbc 9.2; hgb 14.2; hct 44.6; mcv 95.3 plt 217; glucose 199; bun 21; creat 0.62; k+ 3.6; na++ 142; ca 8.7; mag 2.0 08-03-19: wbc 11.3; hgb 14.3; hct 44.5; mcv 93.9; plt 236; glucose 162; bun 19; creat 0.68; k+ 3.7; na++ 139; ca  8.4  12-16-19: urine micro-albumin 8.3 ( on ACE)  hgb a1c 6.9 12-23-19: chol 94; ldl 33 trig 93 hdl 42 vit B 12: 371 02-03-20: wbc 7.9; hgb 13.5; hct 42.3; mcv 93.8 plt 223; glucose  129; bun 11; creat 0.44; k+ 4.1; na++ 133 ca 9.0 liver normal albumin 3.1  05-04-20: hgb a1c 6.4    NO NEW LABS.   Review of Systems  Constitutional: Negative for malaise/fatigue.  Respiratory: Negative for cough and shortness of breath.   Cardiovascular: Negative for chest pain, palpitations and leg swelling.  Gastrointestinal: Negative for abdominal pain, constipation and heartburn.  Musculoskeletal: Negative for back pain, joint pain and myalgias.  Skin: Positive for itching and rash.  Neurological: Negative for dizziness.  Psychiatric/Behavioral: The patient is nervous/anxious and has insomnia.     Physical Exam Constitutional:      General: She is not in acute distress.    Appearance: She is well-developed and well-nourished. She is not diaphoretic.  Neck:     Thyroid: No thyromegaly.  Cardiovascular:     Rate and Rhythm: Normal rate and regular rhythm.     Pulses: Normal pulses and intact distal pulses.     Heart sounds: Normal heart sounds.  Pulmonary:     Effort: Pulmonary effort is normal. No respiratory distress.     Breath sounds: Normal breath sounds.  Abdominal:     General: Bowel sounds are normal. There is no distension.     Palpations: Abdomen is soft.     Tenderness: There is no abdominal tenderness.  Musculoskeletal:        General: No edema.     Cervical back: Neck supple.     Right lower leg: No edema.     Left lower leg: No edema.     Comments:  Right hemiplegia Right upper extremity contracture   Lymphadenopathy:     Cervical: No cervical adenopathy.  Skin:    General: Skin is warm and dry.     Comments: On left foot has a shingles rash   Neurological:     Mental Status: She is alert. Mental status is at baseline.  Psychiatric:        Mood and Affect: Mood and affect  and mood normal.      ASSESSMENT/ PLAN:  TODAY  1. Shingles rash: is worse will begin valtrex 1 gm three times daily for 7 days. Will monitor her status.    MD is aware of resident's narcotic use and is in agreement with  current plan of care. We will attempt to wean resident as appropriate.  Ok Edwards NP [facility]  Contact [phone] Monday through Friday 8am- 5pm  After hours call [phone]

## 2020-08-02 DIAGNOSIS — Z1159 Encounter for screening for other viral diseases: Secondary | ICD-10-CM | POA: Diagnosis not present

## 2020-08-02 DIAGNOSIS — I610 Nontraumatic intracerebral hemorrhage in hemisphere, subcortical: Secondary | ICD-10-CM | POA: Diagnosis not present

## 2020-08-02 DIAGNOSIS — I69351 Hemiplegia and hemiparesis following cerebral infarction affecting right dominant side: Secondary | ICD-10-CM | POA: Diagnosis not present

## 2020-08-03 ENCOUNTER — Non-Acute Institutional Stay (SKILLED_NURSING_FACILITY): Payer: Medicare Other | Admitting: Adult Health

## 2020-08-03 ENCOUNTER — Encounter: Payer: Self-pay | Admitting: Adult Health

## 2020-08-03 DIAGNOSIS — I69351 Hemiplegia and hemiparesis following cerebral infarction affecting right dominant side: Secondary | ICD-10-CM | POA: Diagnosis not present

## 2020-08-03 DIAGNOSIS — I152 Hypertension secondary to endocrine disorders: Secondary | ICD-10-CM | POA: Diagnosis not present

## 2020-08-03 DIAGNOSIS — E1159 Type 2 diabetes mellitus with other circulatory complications: Secondary | ICD-10-CM | POA: Diagnosis not present

## 2020-08-03 DIAGNOSIS — F331 Major depressive disorder, recurrent, moderate: Secondary | ICD-10-CM | POA: Diagnosis not present

## 2020-08-03 NOTE — Progress Notes (Signed)
Location:  [facility] Room Number: 114/W Place of Service:  SNF (31)   CODE STATUS: DNR  Allergies  Allergen Reactions  . Contrast Media [Iodinated Diagnostic Agents] Anaphylaxis    Chief Complaint  Patient presents with  . Acute Visit    Care Plan Meeting     HPI:  We have come together for her care plan meeting. She required extensive to dependent for her adls; she does feed her self. She is nonambulatory. She does have an orthotic for her right wrist which she wears daily. Her cbgs are stable 129-182. Her weight is stable at 161 pounds. There are no reports of falls. There are no reports of uncontrolled pain. She will decline medications at times; she is undergoing an ativan wean. She continues to be followed for her chronic illnesses including: Hypertension associated with type 2 diabetes mellitus  Hemiplegia of right dominant side as late effect of cerebral infarction unspecified hemiplegia type  Moderate episode of recurrent major depressive disorder  Past Medical History:  Diagnosis Date  . Arthritis   . Coronary artery disease    angina  . Diabetes mellitus without complication (HCC)   . Hypertension     Past Surgical History:  Procedure Laterality Date  . APPENDECTOMY    . CHOLECYSTECTOMY      Social History   Socioeconomic History  . Marital status: Widowed    Spouse name: Not on file  . Number of children: Not on file  . Years of education: Not on file  . Highest education level: Not on file  Occupational History  . Not on file  Tobacco Use  . Smoking status: Never Smoker  . Smokeless tobacco: Never Used  Vaping Use  . Vaping Use: Never used  Substance and Sexual Activity  . Alcohol use: No  . Drug use: No  . Sexual activity: Not on file  Other Topics Concern  . Not on file  Social History Narrative  . Not on file   Social Determinants of Health   Financial Resource Strain: Not on file  Food Insecurity: Not on file   Transportation Needs: Not on file  Physical Activity: Not on file  Stress: Not on file  Social Connections: Not on file  Intimate Partner Violence: Not on file   History reviewed. No pertinent family history.    VITAL SIGNS BP 125/68   Pulse 62   Temp 98 F (36.7 C)   Ht 5\' 4"  (1.626 m)   Wt 160 lb 11.2 oz (72.9 kg)   BMI 27.58 kg/m   Outpatient Encounter Medications as of 08/03/2020  Medication Sig  . acetaminophen (TYLENOL) 325 MG tablet Take 650 mg by mouth every 6 (six) hours as needed.   08/05/2020 amLODipine (NORVASC) 10 MG tablet Take 1 tablet (10 mg total) by mouth daily.  [name] atorvastatin (LIPITOR) 20 MG tablet Take 1 tablet (20 mg total) by mouth daily at 6 PM.  . Balsam [place]-Castor Oil (VENELEX) OINT Apply topically. Apply to sacrum and bilateral buttocks qshift for prevention.  . busPIRone (BUSPAR) 7.5 MG tablet Take 7.5 mg by mouth 3 (three) times daily. For Anxiety  . lisinopril (ZESTRIL) 10 MG tablet Take 10 mg by mouth daily.  [name] LORazepam (ATIVAN) 0.5 MG tablet Take 0.5 tablets (0.25 mg total) by mouth at bedtime.  . melatonin 3 MG TABS tablet Take 3 mg by mouth at bedtime.  . metFORMIN (GLUCOPHAGE) 500 MG tablet Take 1 tablet by mouth 2 (  two) times daily.  . NON FORMULARY Diet Change: Regular, carbohydrate consistent with thin liquids  . NON FORMULARY Accu-check qam. Notify provider of results under 60 or over 400. Once A Day  . sertraline (ZOLOFT) 100 MG tablet Take 100 mg by mouth daily. Take along with 25 mg to = 125 mg  . sertraline (ZOLOFT) 25 MG tablet Take 25 mg by mouth daily. Take along with 100 mg to = 125 mg  . valACYclovir (VALTREX) 1000 MG tablet Take 1,000 mg by mouth every 8 (eight) hours. For Shingles rash on left foot   No facility-administered encounter medications on file as of 08/03/2020.     SIGNIFICANT DIAGNOSTIC EXAMS   PREVIOUS   07-26-19: ct of head: 1. 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 2. Mild chronic small  vessel ischemic disease.  07-27-19: ct of head:  1. Stable size of left thalamic and posterior limb internal capsule hemorrhage with increasing surrounding edema, as expected. 2. Increasing mass effect with partial effacement of the left lateral ventricle and 5 mm of midline shift. 3. Stable intraventricular hemorrhage.  07-26-18: 2-d echo:  Left Ventricle: Left ventricular ejection fraction, by visual estimation, is 65 to 70%. The left ventricle has normal function. The left ventricle is not well visualized. There is moderately increased left ventricular hypertrophy. Left ventricular  diastolic parameters are consistent with Grade I diastolic dysfunction (impaired relaxation). Elevated left ventricular end-diastolic pressure.   Right Ventricle: The right ventricular size is normal. No increase in right ventricular wall thickness. Global RV systolic function is has normal systolic function. Left Atrium: Left atrial size was normal in size. Right Atrium: Right atrial size was normal in size Pericardium: There is no evidence of pericardial effusion. Mitral Valve: The mitral valve is normal in structure. No evidence of mitral valve regurgitation. No evidence of mitral valve stenosis by observation. MV peak gradient, 7.4 mmHg. Tricuspid Valve: The tricuspid valve is normal in structure. Tricuspid valve regurgitation is trivial.   Aortic Valve: The aortic valve was not well visualized. Aortic valve regurgitation is not visualized. Mild aortic valve sclerosis is present, with no evidence of aortic valve stenosis. Aortic valve mean gradient measures 6.0 mmHg. Aortic valve peak  gradient measures 11.0 mmHg. Aortic valve area, by VTI measures 2.05 cm.  07-28-19: ct of head: 1. Unchanged size and appearance of left thalamic intraparenchymal hematoma with intraventricular extension. 2. Unchanged minimal rightward midline shift.  07-28-19: swallow study: Dysphagia 1 (Puree) solids;Honey thick liquids   07-31-19:  MRI/MRA of head:  Parenchymal hemorrhage involving the left thalamus and adjacent white matter with intraventricular extension. Associated mass effect is similar to recent CT. No hydrocephalus. Moderate chronic microvascular ischemic changes. Mild irregularity and stenosis of left P1 PCA. Otherwise unremarkable MRA of the head.  02-03-20: DEXA t score -1.009  03-14-20: ct of head and cervical spine:  1. No evidence of acute intracranial process. 2. No acute cervical spine fracture or listhesis. 3. Small soft tissue laceration at the right parietal scalp at the vertex with associated small subcutaneous hematoma and small right parietal scalp hematoma. 4. Moderate to severe multilevel degenerative disc disease of the cervical spine, most severe at C4-5 where there is a large herniated disc material. The AP diameter of the spinal canal in this region is approximately 5-6 mm. Multilevel mild neural foraminal narrowing, most severe on the left at C6-7 where there is also uncovertebral and facet arthrosis. 5. Moderate periventricular white matter changes likely reflecting the sequela of small  vessel ischemia. Previously noted intraparenchymal hematoma within the left basal ganglia and intraventricular hemorrhage has resolved. There is residual encephalomalacia within the posterior limb of the left internal capsule and left thalamus.   NO NEW EXAMS.   Review of Systems  Constitutional: Negative for malaise/fatigue.  Respiratory: Negative for cough and shortness of breath.   Cardiovascular: Negative for chest pain, palpitations and leg swelling.  Gastrointestinal: Negative for abdominal pain, constipation and heartburn.  Musculoskeletal: Negative for back pain, joint pain and myalgias.  Skin: Negative.   Neurological: Negative for dizziness.  Psychiatric/Behavioral: The patient has insomnia. The patient is not nervous/anxious.     Physical Exam Constitutional:      General: She is not in acute  distress.    Appearance: She is well-developed and well-nourished. She is not diaphoretic.  Neck:     Thyroid: No thyromegaly.  Cardiovascular:     Rate and Rhythm: Normal rate and regular rhythm.     Pulses: Normal pulses and intact distal pulses.     Heart sounds: Normal heart sounds.  Pulmonary:     Effort: Pulmonary effort is normal. No respiratory distress.     Breath sounds: Normal breath sounds.  Abdominal:     General: Bowel sounds are normal. There is no distension.     Palpations: Abdomen is soft.     Tenderness: There is no abdominal tenderness.  Musculoskeletal:        General: No edema.     Cervical back: Neck supple.     Right lower leg: No edema.     Left lower leg: No edema.     Comments:   Right hemiplegia Right upper extremity contracture    Lymphadenopathy:     Cervical: No cervical adenopathy.  Skin:    General: Skin is warm and dry.  Neurological:     Mental Status: She is alert. Mental status is at baseline.  Psychiatric:        Mood and Affect: Mood and affect and mood normal.       ASSESSMENT/ PLAN:  TODAY  1. Hypertension associated with type 2 diabetes mellitus 2. Hemiplegia of right dominant side as late effect of cerebral infarction unspecified hemiplegia type 3. Moderate episode of recurrent major depressive disorder  Is presently stable Will continue current medications Will continue current plan of care Will continue to monitor her status.   MD is aware of resident's narcotic use and is in agreement with current plan of care. We will attempt to wean resident as appropriate.  Synthia Innocent NP [facility]  Contact [phone] Monday through Friday 8am- 5pm  After hours call [phone]

## 2020-08-07 DIAGNOSIS — B029 Zoster without complications: Secondary | ICD-10-CM | POA: Insufficient documentation

## 2020-08-08 DIAGNOSIS — L603 Nail dystrophy: Secondary | ICD-10-CM | POA: Diagnosis not present

## 2020-08-08 DIAGNOSIS — E1140 Type 2 diabetes mellitus with diabetic neuropathy, unspecified: Secondary | ICD-10-CM | POA: Diagnosis not present

## 2020-08-09 DIAGNOSIS — I610 Nontraumatic intracerebral hemorrhage in hemisphere, subcortical: Secondary | ICD-10-CM | POA: Diagnosis not present

## 2020-08-09 DIAGNOSIS — Z1159 Encounter for screening for other viral diseases: Secondary | ICD-10-CM | POA: Diagnosis not present

## 2020-08-09 DIAGNOSIS — I69351 Hemiplegia and hemiparesis following cerebral infarction affecting right dominant side: Secondary | ICD-10-CM | POA: Diagnosis not present

## 2020-08-11 DIAGNOSIS — I69351 Hemiplegia and hemiparesis following cerebral infarction affecting right dominant side: Secondary | ICD-10-CM | POA: Diagnosis not present

## 2020-08-11 DIAGNOSIS — Z1159 Encounter for screening for other viral diseases: Secondary | ICD-10-CM | POA: Diagnosis not present

## 2020-08-11 DIAGNOSIS — I610 Nontraumatic intracerebral hemorrhage in hemisphere, subcortical: Secondary | ICD-10-CM | POA: Diagnosis not present

## 2020-08-14 DIAGNOSIS — I610 Nontraumatic intracerebral hemorrhage in hemisphere, subcortical: Secondary | ICD-10-CM | POA: Diagnosis not present

## 2020-08-14 DIAGNOSIS — Z1159 Encounter for screening for other viral diseases: Secondary | ICD-10-CM | POA: Diagnosis not present

## 2020-08-14 DIAGNOSIS — I69351 Hemiplegia and hemiparesis following cerebral infarction affecting right dominant side: Secondary | ICD-10-CM | POA: Diagnosis not present

## 2020-08-16 ENCOUNTER — Non-Acute Institutional Stay (SKILLED_NURSING_FACILITY): Payer: Medicare Other | Admitting: Adult Health

## 2020-08-16 ENCOUNTER — Encounter: Payer: Self-pay | Admitting: Adult Health

## 2020-08-16 DIAGNOSIS — I69391 Dysphagia following cerebral infarction: Secondary | ICD-10-CM

## 2020-08-16 DIAGNOSIS — E785 Hyperlipidemia, unspecified: Secondary | ICD-10-CM

## 2020-08-16 DIAGNOSIS — I615 Nontraumatic intracerebral hemorrhage, intraventricular: Secondary | ICD-10-CM | POA: Diagnosis not present

## 2020-08-16 DIAGNOSIS — E1169 Type 2 diabetes mellitus with other specified complication: Secondary | ICD-10-CM

## 2020-08-16 DIAGNOSIS — I69351 Hemiplegia and hemiparesis following cerebral infarction affecting right dominant side: Secondary | ICD-10-CM | POA: Diagnosis not present

## 2020-08-16 DIAGNOSIS — Z1159 Encounter for screening for other viral diseases: Secondary | ICD-10-CM | POA: Diagnosis not present

## 2020-08-16 DIAGNOSIS — I610 Nontraumatic intracerebral hemorrhage in hemisphere, subcortical: Secondary | ICD-10-CM | POA: Diagnosis not present

## 2020-08-16 NOTE — Progress Notes (Signed)
Location:  [facility] Room Number: 114/W Place of Service:  SNF (31)   CODE STATUS: DNR  Allergies  Allergen Reactions  . Contrast Media [Iodinated Diagnostic Agents] Anaphylaxis    Chief Complaint  Patient presents with  . 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 There are no reports of pain present. She is tolerating her slow wean off ativan. She does have to occasional hives rash. She does spend oaf her time in bed per her choice. Her appetite remains good.   Past Medical History:  Diagnosis Date  . Arthritis   . Coronary artery disease    angina  . Diabetes mellitus without complication (HCC)   . Hypertension     Past Surgical History:  Procedure Laterality Date  . APPENDECTOMY    . CHOLECYSTECTOMY      Social History   Socioeconomic History  . Marital status: Widowed    Spouse name: Not on file  . Number of children: Not on file  . Years of education: Not on file  . Highest education level: Not on file  Occupational History  . Not on file  Tobacco Use  . Smoking status: Never Smoker  . Smokeless tobacco: Never Used  Vaping Use  . Vaping Use: Never used  Substance and Sexual Activity  . Alcohol use: No  . Drug use: No  . Sexual activity: Not on file  Other Topics Concern  . Not on file  Social History Narrative  . Not on file   Social Determinants of Health   Financial Resource Strain: Not  on file  Food Insecurity: Not on file  Transportation Needs: Not on file  Physical Activity: Not on file  Stress: Not on file  Social Connections: Not on file  Intimate Partner Violence: Not on file   History reviewed. No pertinent family history.    VITAL SIGNS BP 130/63   Pulse 63   Temp (!) 97.1 F (36.2 C)   Ht 5\' 4"  (1.626 m)   Wt 160 lb 11.2 oz (72.9 kg)   BMI 27.58 kg/m   Outpatient Encounter Medications as of 08/16/2020  Medication Sig  . acetaminophen (TYLENOL) 325 MG tablet Take 650 mg by mouth every 6 (six) hours as needed.   08/18/2020 amLODipine (NORVASC) 10 MG tablet Take 1 tablet (10 mg total) by mouth daily.  [name] atorvastatin (LIPITOR) 20 MG tablet Take 1 tablet (20 mg total) by mouth daily at 6 PM.  . Balsam [place]-Castor Oil (VENELEX) OINT Apply topically. Apply to sacrum and bilateral buttocks qshift for prevention.  . busPIRone (BUSPAR) 7.5 MG tablet Take 7.5 mg by mouth 2 (two) times daily. For Anxiety  . lisinopril (ZESTRIL) 10 MG tablet Take 10 mg by mouth daily.  [name] ON 08/17/2020] loratadine (CLARITIN) 10 MG tablet Take 10 mg by mouth daily. For Hives  . LORazepam (ATIVAN) 0.5 MG tablet Take 0.5 tablets (0.25 mg total) by mouth at bedtime.  08/19/2020  melatonin 3 MG TABS tablet Take 3 mg by mouth at bedtime.  . metFORMIN (GLUCOPHAGE) 500 MG tablet Take 1 tablet by mouth 2 (two) times daily.  . NON FORMULARY Diet Change: Regular, carbohydrate consistent with thin liquids  . NON FORMULARY Accu-check qam. Notify provider of results under 60 or over 400. Once A Day  . sertraline (ZOLOFT) 100 MG tablet Take 100 mg by mouth daily. Take along with 25 mg to = 125 mg  . sertraline (ZOLOFT) 25 MG tablet Take 25 mg by mouth daily. Take along with 100 mg to = 125 mg  . [DISCONTINUED] valACYclovir (VALTREX) 1000 MG tablet Take 1,000 mg by mouth every 8 (eight) hours. For Shingles rash on left foot   No facility-administered encounter medications on file as of 08/16/2020.      SIGNIFICANT DIAGNOSTIC EXAMS   PREVIOUS   07-26-19: ct of head: 1. 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 2. Mild chronic small vessel ischemic disease.  07-27-19: ct of head:  1. Stable size of left thalamic and posterior limb internal capsule hemorrhage with increasing surrounding edema, as expected. 2. Increasing mass effect with partial effacement of the left lateral ventricle and 5 mm of midline shift. 3. Stable intraventricular hemorrhage.  07-26-18: 2-d echo:  Left Ventricle: Left ventricular ejection fraction, by visual estimation, is 65 to 70%. The left ventricle has normal function. The left ventricle is not well visualized. There is moderately increased left ventricular hypertrophy. Left ventricular  diastolic parameters are consistent with Grade I diastolic dysfunction (impaired relaxation). Elevated left ventricular end-diastolic pressure.   Right Ventricle: The right ventricular size is normal. No increase in right ventricular wall thickness. Global RV systolic function is has normal systolic function. Left Atrium: Left atrial size was normal in size. Right Atrium: Right atrial size was normal in size Pericardium: There is no evidence of pericardial effusion. Mitral Valve: The mitral valve is normal in structure. No evidence of mitral valve regurgitation. No evidence of mitral valve stenosis by observation. MV peak gradient, 7.4 mmHg. Tricuspid Valve: The tricuspid valve is normal in structure. Tricuspid valve regurgitation is trivial.   Aortic Valve: The aortic valve was not well visualized. Aortic valve regurgitation is not visualized. Mild aortic valve sclerosis is present, with no evidence of aortic valve stenosis. Aortic valve mean gradient measures 6.0 mmHg. Aortic valve peak  gradient measures 11.0 mmHg. Aortic valve area, by VTI measures 2.05 cm.  07-28-19: ct of head: 1. Unchanged size and appearance of left thalamic intraparenchymal hematoma  with intraventricular extension. 2. Unchanged minimal rightward midline shift.  07-28-19: swallow study: Dysphagia 1 (Puree) solids;Honey thick liquids   07-31-19: MRI/MRA of head:  Parenchymal hemorrhage involving the left thalamus and adjacent white matter with intraventricular extension. Associated mass effect is similar to recent CT. No hydrocephalus. Moderate chronic microvascular ischemic changes. Mild irregularity and stenosis of left P1 PCA. Otherwise unremarkable MRA of the head.  02-03-20: DEXA t score -1.009  03-14-20: ct of head and cervical spine:  1. No evidence of acute intracranial process. 2. No acute cervical spine fracture or listhesis. 3. Small soft tissue laceration at the right parietal scalp at the vertex with associated small subcutaneous hematoma and small right parietal scalp hematoma. 4. Moderate to severe multilevel degenerative disc disease of the cervical spine, most severe at C4-5 where there is a large herniated disc material. The AP diameter of the spinal canal in this region is approximately 5-6 mm. Multilevel mild neural foraminal narrowing, most  severe on the left at C6-7 where there is also uncovertebral and facet arthrosis. 5. Moderate periventricular white matter changes likely reflecting the sequela of small vessel ischemia. Previously noted intraparenchymal hematoma within the left basal ganglia and intraventricular hemorrhage has resolved. There is residual encephalomalacia within the posterior limb of the left internal capsule and left thalamus.   NO NEW EXAMS.   LABS REVIEWED PREVIOUS  07-26-19: wbc 6.9; hgb 15.5; hct 45.8; mcv 91.4 plt 208; glucose 227; bun 15 creat 0.60; k+ 7.9; na++ 132; ca 8.9; liver normal albumin 3.2 hgb a1c 6.6 07-27-19: chol 205; ldl 120; trig 166; hdl 53 0-3-83: wbc 10.3; hgb 14.3; hct 43.5; mcv 91.8 plt 211; glucose 177; bun 21; creat 0.85; k+ 3.7; na++ 139; ca 8.4 08-01-19: wbc 9.6; hgb 14.5; ht 45.1; mcv 93.8 plt 213; glucose  131; bun 21; creat 0.67; k+ 3.7; na++ 148 ca 8.6 08-02-19: wbc 9.2; hgb 14.2; hct 44.6; mcv 95.3 plt 217; glucose 199; bun 21; creat 0.62; k+ 3.6; na++ 142; ca 8.7; mag 2.0 08-03-19: wbc 11.3; hgb 14.3; hct 44.5; mcv 93.9; plt 236; glucose 162; bun 19; creat 0.68; k+ 3.7; na++ 139; ca 8.4  12-16-19: urine micro-albumin 8.3 ( on ACE)  hgb a1c 6.9 12-23-19: chol 94; ldl 33 trig 93 hdl 42 vit B 12: 371 02-03-20: wbc 7.9; hgb 13.5; hct 42.3; mcv 93.8 plt 223; glucose  129; bun 11; creat 0.44; k+ 4.1; na++ 133 ca 9.0 liver normal albumin 3.1  05-04-20: hgb a1c 6.4   NO NEW LABS   Review of Systems  Constitutional: Negative for malaise/fatigue.  Respiratory: Negative for cough and shortness of breath.   Cardiovascular: Negative for chest pain, palpitations and leg swelling.  Gastrointestinal: Negative for abdominal pain, constipation and heartburn.  Musculoskeletal: Negative for back pain, joint pain and myalgias.  Skin: Positive for rash.       Has hives periodically   Neurological: Negative for dizziness.  Psychiatric/Behavioral: The patient is not nervous/anxious.    Physical Exam Constitutional:      General: She is not in acute distress.    Appearance: She is well-developed and well-nourished. She is not diaphoretic.  Neck:     Thyroid: No thyromegaly.  Cardiovascular:     Rate and Rhythm: Normal rate and regular rhythm.     Pulses: Normal pulses and intact distal pulses.     Heart sounds: Normal heart sounds.  Pulmonary:     Effort: Pulmonary effort is normal. No respiratory distress.     Breath sounds: Normal breath sounds.  Abdominal:     General: Bowel sounds are normal. There is no distension.     Palpations: Abdomen is soft.     Tenderness: There is no abdominal tenderness.  Musculoskeletal:        General: No edema.     Cervical back: Neck supple.     Right lower leg: No edema.     Left lower leg: No edema.     Comments: Right hemiplegia Right upper extremity contracture      Lymphadenopathy:     Cervical: No cervical adenopathy.  Skin:    General: Skin is warm and dry.     Comments: No rash at this time   Neurological:     Mental Status: She is alert. Mental status is at baseline.  Psychiatric:        Mood and Affect: Mood and affect and mood normal.       ASSESSMENT/ PLAN:  TODAY  1. 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 Dysphagia due to CVA: is stable no sings of aspiration present; will continue thin liquids.   3. Dyslipidemia associated with type 2 diabetes mellitus is stable LDL 33 will continue lipitor 20 mg daily   PREVIOUS  4. Chronic constipation: is stable will continue senna s twice daily   5. Hypertension associated with type 2 diabetes mellitus is stable b/p 130/63 will continue lisinopril 10 mg daily norvasc 10 mg daily 4  6. Type 2 diabetes mellitus with peripheral artery disease: hgb a1c 6.4 will continue metformin 500 mg twice daily is on ace statin no asa   7. Moderate episode of current major depressive disorder is stable will continue zoloft 125 mg daily ativan 0.25 mg daily (has failed one wean in the past)buspar 7.5 mg twice daily she will decline doses at times.   8. Hives will begin claritin 10 mg daily will monitor        MD is aware of resident's narcotic use and is in agreement with current plan of care. We will attempt to wean resident as appropriate.  Synthia Innocent NP [facility]  Contact [phone] Monday through Friday 8am- 5pm  After hours call [phone]

## 2020-08-18 DIAGNOSIS — F331 Major depressive disorder, recurrent, moderate: Secondary | ICD-10-CM | POA: Diagnosis not present

## 2020-08-18 DIAGNOSIS — I69351 Hemiplegia and hemiparesis following cerebral infarction affecting right dominant side: Secondary | ICD-10-CM | POA: Diagnosis not present

## 2020-08-18 DIAGNOSIS — I610 Nontraumatic intracerebral hemorrhage in hemisphere, subcortical: Secondary | ICD-10-CM | POA: Diagnosis not present

## 2020-08-18 DIAGNOSIS — Z1159 Encounter for screening for other viral diseases: Secondary | ICD-10-CM | POA: Diagnosis not present

## 2020-08-21 DIAGNOSIS — Z1159 Encounter for screening for other viral diseases: Secondary | ICD-10-CM | POA: Diagnosis not present

## 2020-08-21 DIAGNOSIS — I610 Nontraumatic intracerebral hemorrhage in hemisphere, subcortical: Secondary | ICD-10-CM | POA: Diagnosis not present

## 2020-08-21 DIAGNOSIS — I69351 Hemiplegia and hemiparesis following cerebral infarction affecting right dominant side: Secondary | ICD-10-CM | POA: Diagnosis not present

## 2020-08-23 DIAGNOSIS — I610 Nontraumatic intracerebral hemorrhage in hemisphere, subcortical: Secondary | ICD-10-CM | POA: Diagnosis not present

## 2020-08-23 DIAGNOSIS — Z1159 Encounter for screening for other viral diseases: Secondary | ICD-10-CM | POA: Diagnosis not present

## 2020-08-23 DIAGNOSIS — I69351 Hemiplegia and hemiparesis following cerebral infarction affecting right dominant side: Secondary | ICD-10-CM | POA: Diagnosis not present

## 2020-08-24 DIAGNOSIS — F331 Major depressive disorder, recurrent, moderate: Secondary | ICD-10-CM | POA: Diagnosis not present

## 2020-08-25 DIAGNOSIS — I610 Nontraumatic intracerebral hemorrhage in hemisphere, subcortical: Secondary | ICD-10-CM | POA: Diagnosis not present

## 2020-08-25 DIAGNOSIS — I69351 Hemiplegia and hemiparesis following cerebral infarction affecting right dominant side: Secondary | ICD-10-CM | POA: Diagnosis not present

## 2020-08-25 DIAGNOSIS — Z1159 Encounter for screening for other viral diseases: Secondary | ICD-10-CM | POA: Diagnosis not present

## 2020-08-28 DIAGNOSIS — I610 Nontraumatic intracerebral hemorrhage in hemisphere, subcortical: Secondary | ICD-10-CM | POA: Diagnosis not present

## 2020-08-28 DIAGNOSIS — I69351 Hemiplegia and hemiparesis following cerebral infarction affecting right dominant side: Secondary | ICD-10-CM | POA: Diagnosis not present

## 2020-08-28 DIAGNOSIS — Z1159 Encounter for screening for other viral diseases: Secondary | ICD-10-CM | POA: Diagnosis not present

## 2020-08-29 ENCOUNTER — Other Ambulatory Visit: Payer: Self-pay | Admitting: Adult Health

## 2020-08-29 MED ORDER — LORAZEPAM 0.5 MG PO TABS: 0.2500 mg | ORAL_TABLET | Freq: Every day | ORAL | 0 refills | Status: DC

## 2020-08-30 DIAGNOSIS — I69351 Hemiplegia and hemiparesis following cerebral infarction affecting right dominant side: Secondary | ICD-10-CM | POA: Diagnosis not present

## 2020-08-30 DIAGNOSIS — Z1159 Encounter for screening for other viral diseases: Secondary | ICD-10-CM | POA: Diagnosis not present

## 2020-08-30 DIAGNOSIS — I610 Nontraumatic intracerebral hemorrhage in hemisphere, subcortical: Secondary | ICD-10-CM | POA: Diagnosis not present

## 2020-09-01 DIAGNOSIS — I69351 Hemiplegia and hemiparesis following cerebral infarction affecting right dominant side: Secondary | ICD-10-CM | POA: Diagnosis not present

## 2020-09-01 DIAGNOSIS — I610 Nontraumatic intracerebral hemorrhage in hemisphere, subcortical: Secondary | ICD-10-CM | POA: Diagnosis not present

## 2020-09-01 DIAGNOSIS — Z1159 Encounter for screening for other viral diseases: Secondary | ICD-10-CM | POA: Diagnosis not present

## 2020-09-04 DIAGNOSIS — Z1159 Encounter for screening for other viral diseases: Secondary | ICD-10-CM | POA: Diagnosis not present

## 2020-09-04 DIAGNOSIS — I610 Nontraumatic intracerebral hemorrhage in hemisphere, subcortical: Secondary | ICD-10-CM | POA: Diagnosis not present

## 2020-09-04 DIAGNOSIS — I69351 Hemiplegia and hemiparesis following cerebral infarction affecting right dominant side: Secondary | ICD-10-CM | POA: Diagnosis not present

## 2020-09-07 DIAGNOSIS — F331 Major depressive disorder, recurrent, moderate: Secondary | ICD-10-CM | POA: Diagnosis not present

## 2020-09-08 DIAGNOSIS — F411 Generalized anxiety disorder: Secondary | ICD-10-CM | POA: Diagnosis not present

## 2020-09-08 DIAGNOSIS — G3184 Mild cognitive impairment, so stated: Secondary | ICD-10-CM | POA: Diagnosis not present

## 2020-09-12 ENCOUNTER — Encounter: Payer: Self-pay | Admitting: Adult Health

## 2020-09-12 ENCOUNTER — Non-Acute Institutional Stay (SKILLED_NURSING_FACILITY): Payer: Medicare Other | Admitting: Adult Health

## 2020-09-12 DIAGNOSIS — E1151 Type 2 diabetes mellitus with diabetic peripheral angiopathy without gangrene: Secondary | ICD-10-CM | POA: Diagnosis not present

## 2020-09-12 DIAGNOSIS — I152 Hypertension secondary to endocrine disorders: Secondary | ICD-10-CM | POA: Diagnosis not present

## 2020-09-12 DIAGNOSIS — K5909 Other constipation: Secondary | ICD-10-CM

## 2020-09-12 DIAGNOSIS — E1159 Type 2 diabetes mellitus with other circulatory complications: Secondary | ICD-10-CM

## 2020-09-12 NOTE — Progress Notes (Signed)
Location:  [facility] Room Number: 114-W Place of Service:  SNF (31)   CODE STATUS: DNR  Allergies  Allergen Reactions  . Contrast Media [Iodinated Diagnostic Agents] Anaphylaxis    Chief Complaint  Patient presents with  . Medical Management of Chronic Issues           Chronic constipation:  Hypertension associated with type 2 diabetes mellitus: Type 2 diabetes mellitus with peripheral artery disease:    HPI:  She is a 87 year old long term resident of this facility being seen for the management of her chronic illnesses; Chronic constipation:  Hypertension associated with type 2 diabetes mellitus: Type 2 diabetes mellitus with peripheral artery disease. There are no reports of uncontrolled pain; she will decline medications at times. There are no reports of uncontrolled anxiety present.   Past Medical History:  Diagnosis Date  . Arthritis   . Coronary artery disease    angina  . Diabetes mellitus without complication (HCC)   . Hypertension     Past Surgical History:  Procedure Laterality Date  . APPENDECTOMY    . CHOLECYSTECTOMY      Social History   Socioeconomic History  . Marital status: Widowed    Spouse name: Not on file  . Number of children: Not on file  . Years of education: Not on file  . Highest education level: Not on file  Occupational History  . Not on file  Tobacco Use  . Smoking status: Never Smoker  . Smokeless tobacco: Never Used  Vaping Use  . Vaping Use: Never used  Substance and Sexual Activity  . Alcohol use: No  . Drug use: No  . Sexual activity: Not on file  Other Topics Concern  . Not on file  Social History Narrative  . Not on file   Social Determinants of Health   Financial Resource Strain: Not on file  Food Insecurity: Not on file  Transportation Needs: Not on file  Physical Activity: Not on file  Stress: Not on file  Social Connections: Not on file  Intimate Partner Violence: Not on file    History reviewed. No pertinent family history.    VITAL SIGNS BP (!) 130/57   Pulse (!) 55   Temp 97.6 F (36.4 C)   Resp 20   Ht 5\' 4"  (1.626 m)   Wt 161 lb 12.8 oz (73.4 kg)   SpO2 97%   BMI 27.77 kg/m   Outpatient Encounter Medications as of 09/12/2020  Medication Sig  . acetaminophen (TYLENOL) 325 MG tablet Take 650 mg by mouth every 6 (six) hours as needed.   09/14/2020 amLODipine (NORVASC) 10 MG tablet Take 1 tablet (10 mg total) by mouth daily.  [name] atorvastatin (LIPITOR) 20 MG tablet Take 1 tablet (20 mg total) by mouth daily at 6 PM.  . Balsam [place]-Castor Oil (VENELEX) OINT Apply topically. Apply to sacrum and bilateral buttocks qshift for prevention.  . busPIRone (BUSPAR) 7.5 MG tablet Take 7.5 mg by mouth 2 (two) times daily. For Anxiety  . lisinopril (ZESTRIL) 10 MG tablet Take 10 mg by mouth daily.  [name] loratadine (CLARITIN) 10 MG tablet Take 10 mg by mouth daily. For Hives  . LORazepam (ATIVAN) 0.5 MG tablet Take 0.5 tablets (0.25 mg total) by mouth at bedtime.  . melatonin 3 MG TABS tablet Take 3 mg by mouth at bedtime.  . metFORMIN (GLUCOPHAGE) 500 MG tablet Take 1 tablet by mouth 2 (two) times daily.  [name]  NON FORMULARY Diet Change: Regular, carbohydrate consistent with thin liquids  . NON FORMULARY Accu-check qam. Notify provider of results under 60 or over 400. Once A Day  . sertraline (ZOLOFT) 100 MG tablet Take 100 mg by mouth daily. Take along with 25 mg to = 125 mg  . sertraline (ZOLOFT) 25 MG tablet Take 25 mg by mouth daily. Take along with 100 mg to = 125 mg  . Soap & Cleansers (CETAPHIL GENTLE CLEANSER EX) Apply 1 application topically daily.   No facility-administered encounter medications on file as of 09/12/2020.     SIGNIFICANT DIAGNOSTIC EXAMS  PREVIOUS   07-26-18: 2-d echo:  Left Ventricle: Left ventricular ejection fraction, by visual estimation, is 65 to 70%. The left ventricle has normal function. The left ventricle is not well visualized. There is  moderately increased left ventricular hypertrophy. Left ventricular  diastolic parameters are consistent with Grade I diastolic dysfunction (impaired relaxation). Elevated left ventricular end-diastolic pressure.   Right Ventricle: The right ventricular size is normal. No increase in right ventricular wall thickness. Global RV systolic function is has normal systolic function. Left Atrium: Left atrial size was normal in size. Right Atrium: Right atrial size was normal in size Pericardium: There is no evidence of pericardial effusion. Mitral Valve: The mitral valve is normal in structure. No evidence of mitral valve regurgitation. No evidence of mitral valve stenosis by observation. MV peak gradient, 7.4 mmHg. Tricuspid Valve: The tricuspid valve is normal in structure. Tricuspid valve regurgitation is trivial.   Aortic Valve: The aortic valve was not well visualized. Aortic valve regurgitation is not visualized. Mild aortic valve sclerosis is present, with no evidence of aortic valve stenosis. Aortic valve mean gradient measures 6.0 mmHg. Aortic valve peak  gradient measures 11.0 mmHg. Aortic valve area, by VTI measures 2.05 cm.  07-28-19: ct of head: 1. Unchanged size and appearance of left thalamic intraparenchymal hematoma with intraventricular extension. 2. Unchanged minimal rightward midline shift.  07-28-19: swallow study: Dysphagia 1 (Puree) solids;Honey thick liquids   07-31-19: MRI/MRA of head:  Parenchymal hemorrhage involving the left thalamus and adjacent white matter with intraventricular extension. Associated mass effect is similar to recent CT. No hydrocephalus. Moderate chronic microvascular ischemic changes. Mild irregularity and stenosis of left P1 PCA. Otherwise unremarkable MRA of the head.  02-03-20: DEXA t score -1.009  03-14-20: ct of head and cervical spine:  1. No evidence of acute intracranial process. 2. No acute cervical spine fracture or listhesis. 3. Small soft tissue  laceration at the right parietal scalp at the vertex with associated small subcutaneous hematoma and small right parietal scalp hematoma. 4. Moderate to severe multilevel degenerative disc disease of the cervical spine, most severe at C4-5 where there is a large herniated disc material. The AP diameter of the spinal canal in this region is approximately 5-6 mm. Multilevel mild neural foraminal narrowing, most severe on the left at C6-7 where there is also uncovertebral and facet arthrosis. 5. Moderate periventricular white matter changes likely reflecting the sequela of small vessel ischemia. Previously noted intraparenchymal hematoma within the left basal ganglia and intraventricular hemorrhage has resolved. There is residual encephalomalacia within the posterior limb of the left internal capsule and left thalamus.   NO NEW EXAMS.   LABS REVIEWED PREVIOUS  12-16-19: urine micro-albumin 8.3 ( on ACE)  hgb a1c 6.9 12-23-19: chol 94; ldl 33 trig 93 hdl 42 vit B 12: 371 02-03-20: wbc 7.9; hgb 13.5; hct 42.3; mcv 93.8 plt 223; glucose  129; bun 11; creat 0.44; k+ 4.1; na++ 133 ca 9.0 liver normal albumin 3.1  05-04-20: hgb a1c 6.4   NO NEW LABS   Review of Systems  Constitutional: Negative for malaise/fatigue.  Respiratory: Negative for cough and shortness of breath.   Cardiovascular: Negative for chest pain, palpitations and leg swelling.  Gastrointestinal: Negative for abdominal pain, constipation and heartburn.  Musculoskeletal: Negative for back pain, joint pain and myalgias.  Skin: Negative.   Neurological: Negative for dizziness.  Psychiatric/Behavioral: The patient is not nervous/anxious.     Physical Exam Constitutional:      General: She is not in acute distress.    Appearance: She is well-developed and well-nourished. She is not diaphoretic.  Neck:     Thyroid: No thyromegaly.  Cardiovascular:     Rate and Rhythm: Normal rate and regular rhythm.     Pulses: Normal pulses and  intact distal pulses.     Heart sounds: Normal heart sounds.  Pulmonary:     Effort: Pulmonary effort is normal. No respiratory distress.     Breath sounds: Normal breath sounds.  Abdominal:     General: Bowel sounds are normal. There is no distension.     Palpations: Abdomen is soft.     Tenderness: There is no abdominal tenderness.  Musculoskeletal:        General: No edema.     Cervical back: Neck supple.     Right lower leg: No edema.     Left lower leg: No edema.     Comments: Right hemiplegia Right upper extremity contracture    Lymphadenopathy:     Cervical: No cervical adenopathy.  Skin:    General: Skin is warm and dry.  Neurological:     Mental Status: She is alert. Mental status is at baseline.  Psychiatric:        Mood and Affect: Mood and affect and mood normal.    ASSESSMENT/ PLAN:  TODAY  1. Chronic constipation: is stable will continue senna s twice daily   2. Hypertension associated with type 2 diabetes mellitus: is stable b/p 130/57 will continue lisinopril 10 mg daily norvasc 10 mg daily   3. Type 2 diabetes mellitus with peripheral artery disease: is stable hgb a1c 6.4 will continue metformin 500 mg twice daily is on ace statin no asa   PREVIOUS  4. Moderate episode of current major depressive disorder is stable will continue zoloft 125 mg daily ativan 0.25 mg daily (has failed one wean in the past)buspar 7.5 mg twice daily she will decline doses at times.   5. Hives will continue claritin 10 mg daily will monitor   6. 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 Dysphagia due to CVA: is stable no sings of aspiration present; will continue thin liquids.   8. Dyslipidemia associated with type 2 diabetes mellitus is stable LDL 33 will continue lipitor 20 mg daily   Will check cbc; cmp; lipids; hgb a1c           MD is aware of resident's  narcotic use and is in agreement with current plan of care. We will attempt to wean resident as appropriate.  Synthia Innocent NP [facility]  Contact [phone] Monday through Friday 8am- 5pm  After hours call [phone]

## 2020-09-20 DIAGNOSIS — E119 Type 2 diabetes mellitus without complications: Secondary | ICD-10-CM | POA: Diagnosis not present

## 2020-09-20 DIAGNOSIS — H524 Presbyopia: Secondary | ICD-10-CM | POA: Diagnosis not present

## 2020-09-20 DIAGNOSIS — H25813 Combined forms of age-related cataract, bilateral: Secondary | ICD-10-CM | POA: Diagnosis not present

## 2020-09-21 ENCOUNTER — Other Ambulatory Visit: Payer: Self-pay | Admitting: Adult Health

## 2020-09-21 DIAGNOSIS — F331 Major depressive disorder, recurrent, moderate: Secondary | ICD-10-CM | POA: Diagnosis not present

## 2020-09-27 ENCOUNTER — Non-Acute Institutional Stay (SKILLED_NURSING_FACILITY): Payer: Medicare Other | Admitting: Adult Health

## 2020-09-27 ENCOUNTER — Encounter: Payer: Self-pay | Admitting: Adult Health

## 2020-09-27 DIAGNOSIS — F331 Major depressive disorder, recurrent, moderate: Secondary | ICD-10-CM

## 2020-09-27 DIAGNOSIS — F0630 Mood disorder due to known physiological condition, unspecified: Secondary | ICD-10-CM

## 2020-09-27 DIAGNOSIS — I69398 Other sequelae of cerebral infarction: Secondary | ICD-10-CM

## 2020-09-27 NOTE — Progress Notes (Signed)
Location:  [facility] Room Number: 114/W Place of Service:  SNF (31)   CODE STATUS: DNR  Allergies  Allergen Reactions  . Contrast Media [Iodinated Diagnostic Agents] Anaphylaxis    Chief Complaint  Patient presents with  . Acute Visit    Patient Concerns     HPI:  She is voicing concerns about her medications and she wants to review them. She is agreeing to all of her medications with few exceptions. She does not want to continue zoloft; she states that she has not taken this medication "for quite some time". She wants her ativan returned to 0.5 mg nightly. She states that she gets anxious with palpitations which is worse at night. She states that buspar helps some but not enough. She is not willing to increase the dosage of the buspar. She does not want any further blood work to be done at this time.   Past Medical History:  Diagnosis Date  . Arthritis   . Coronary artery disease    angina  . Diabetes mellitus without complication (HCC)   . Hypertension     Past Surgical History:  Procedure Laterality Date  . APPENDECTOMY    . CHOLECYSTECTOMY      Social History   Socioeconomic History  . Marital status: Widowed    Spouse name: Not on file  . Number of children: Not on file  . Years of education: Not on file  . Highest education level: Not on file  Occupational History  . Not on file  Tobacco Use  . Smoking status: Never Smoker  . Smokeless tobacco: Never Used  Vaping Use  . Vaping Use: Never used  Substance and Sexual Activity  . Alcohol use: No  . Drug use: No  . Sexual activity: Not on file  Other Topics Concern  . Not on file  Social History Narrative  . Not on file   Social Determinants of Health   Financial Resource Strain: Not on file  Food Insecurity: Not on file  Transportation Needs: Not on file  Physical Activity: Not on file  Stress: Not on file  Social Connections: Not on file  Intimate Partner Violence: Not  on file   History reviewed. No pertinent family history.    VITAL SIGNS BP (!) 123/59   Pulse 63   Temp 98.4 F (36.9 C)   Resp 20   Ht 5\' 4"  (1.626 m)   Wt 164 lb 3.2 oz (74.5 kg)   BMI 28.18 kg/m   Outpatient Encounter Medications as of 09/27/2020  Medication Sig  . acetaminophen (TYLENOL) 325 MG tablet Take 650 mg by mouth every 6 (six) hours as needed.   11/27/2020 amLODipine (NORVASC) 10 MG tablet Take 1 tablet (10 mg total) by mouth daily.  [name] atorvastatin (LIPITOR) 20 MG tablet Take 1 tablet (20 mg total) by mouth daily at 6 PM.  . Balsam [place]-Castor Oil (VENELEX) OINT Apply topically. Apply to sacrum and bilateral buttocks qshift for prevention.  . busPIRone (BUSPAR) 7.5 MG tablet Take 7.5 mg by mouth 2 (two) times daily. For Anxiety  . ketoconazole (NIZORAL) 2 % cream Apply 1 application topically 2 (two) times daily. Special Instructions: To rash left forearm, Clean well with soap and water apply Nizoral 2% Cream BID for 14 days.  [name] lisinopril (ZESTRIL) 10 MG tablet Take 10 mg by mouth daily.  [name] loratadine (CLARITIN) 10 MG tablet Take 10 mg by mouth daily. For Hives  . LORazepam (ATIVAN)  0.5 MG tablet Take 0.5 tablets (0.25 mg total) by mouth at bedtime.  . melatonin 3 MG TABS tablet Take 3 mg by mouth at bedtime.  . metFORMIN (GLUCOPHAGE) 500 MG tablet Take 1 tablet by mouth 2 (two) times daily.  . NON FORMULARY Diet Change: Regular, carbohydrate consistent with thin liquids  . NON FORMULARY Accu-check qam. Notify provider of results under 60 or over 400. Once A Day  . Soap & Cleansers (CETAPHIL GENTLE CLEANSER EX) Apply 1 application topically daily. Wash skin with cleanser  . [DISCONTINUED] sertraline (ZOLOFT) 100 MG tablet Take 100 mg by mouth daily. Take along with 25 mg to = 125 mg  . [DISCONTINUED] sertraline (ZOLOFT) 25 MG tablet Take 25 mg by mouth daily. Take along with 100 mg to = 125 mg   No facility-administered encounter medications on file as of 09/27/2020.      SIGNIFICANT DIAGNOSTIC EXAMS  PREVIOUS   07-26-18: 2-d echo:  Left Ventricle: Left ventricular ejection fraction, by visual estimation, is 65 to 70%. The left ventricle has normal function. The left ventricle is not well visualized. There is moderately increased left ventricular hypertrophy. Left ventricular  diastolic parameters are consistent with Grade I diastolic dysfunction (impaired relaxation). Elevated left ventricular end-diastolic pressure.   Right Ventricle: The right ventricular size is normal. No increase in right ventricular wall thickness. Global RV systolic function is has normal systolic function. Left Atrium: Left atrial size was normal in size. Right Atrium: Right atrial size was normal in size Pericardium: There is no evidence of pericardial effusion. Mitral Valve: The mitral valve is normal in structure. No evidence of mitral valve regurgitation. No evidence of mitral valve stenosis by observation. MV peak gradient, 7.4 mmHg. Tricuspid Valve: The tricuspid valve is normal in structure. Tricuspid valve regurgitation is trivial.   Aortic Valve: The aortic valve was not well visualized. Aortic valve regurgitation is not visualized. Mild aortic valve sclerosis is present, with no evidence of aortic valve stenosis. Aortic valve mean gradient measures 6.0 mmHg. Aortic valve peak  gradient measures 11.0 mmHg. Aortic valve area, by VTI measures 2.05 cm.  07-28-19: ct of head: 1. Unchanged size and appearance of left thalamic intraparenchymal hematoma with intraventricular extension. 2. Unchanged minimal rightward midline shift.  07-28-19: swallow study: Dysphagia 1 (Puree) solids;Honey thick liquids   07-31-19: MRI/MRA of head:  Parenchymal hemorrhage involving the left thalamus and adjacent white matter with intraventricular extension. Associated mass effect is similar to recent CT. No hydrocephalus. Moderate chronic microvascular ischemic changes. Mild irregularity and  stenosis of left P1 PCA. Otherwise unremarkable MRA of the head.  02-03-20: DEXA t score -1.009  03-14-20: ct of head and cervical spine:  1. No evidence of acute intracranial process. 2. No acute cervical spine fracture or listhesis. 3. Small soft tissue laceration at the right parietal scalp at the vertex with associated small subcutaneous hematoma and small right parietal scalp hematoma. 4. Moderate to severe multilevel degenerative disc disease of the cervical spine, most severe at C4-5 where there is a large herniated disc material. The AP diameter of the spinal canal in this region is approximately 5-6 mm. Multilevel mild neural foraminal narrowing, most severe on the left at C6-7 where there is also uncovertebral and facet arthrosis. 5. Moderate periventricular white matter changes likely reflecting the sequela of small vessel ischemia. Previously noted intraparenchymal hematoma within the left basal ganglia and intraventricular hemorrhage has resolved. There is residual encephalomalacia within the posterior limb of the left internal capsule  and left thalamus.   NO NEW EXAMS.   LABS REVIEWED PREVIOUS  She is declining lab work at this time.   12-16-19: urine micro-albumin 8.3 ( on ACE)  hgb a1c 6.9 12-23-19: chol 94; ldl 33 trig 93 hdl 42 vit B 12: 371 02-03-20: wbc 7.9; hgb 13.5; hct 42.3; mcv 93.8 plt 223; glucose  129; bun 11; creat 0.44; k+ 4.1; na++ 133 ca 9.0 liver normal albumin 3.1  05-04-20: hgb a1c 6.4   NO NEW LABS   Review of Systems  Constitutional: Negative for malaise/fatigue.  Respiratory: Negative for cough and shortness of breath.   Cardiovascular: Negative for chest pain, palpitations and leg swelling.  Gastrointestinal: Negative for abdominal pain, constipation and heartburn.  Musculoskeletal: Negative for back pain, joint pain and myalgias.  Skin: Negative.   Neurological: Negative for dizziness.  Psychiatric/Behavioral: The patient is nervous/anxious and has  insomnia.    Physical Exam Constitutional:      General: She is not in acute distress.    Appearance: She is well-developed. She is not diaphoretic.  Neck:     Thyroid: No thyromegaly.  Cardiovascular:     Rate and Rhythm: Normal rate and regular rhythm.     Pulses: Normal pulses.     Heart sounds: Normal heart sounds.  Pulmonary:     Effort: Pulmonary effort is normal. No respiratory distress.     Breath sounds: Normal breath sounds.  Abdominal:     General: Bowel sounds are normal. There is no distension.     Palpations: Abdomen is soft.     Tenderness: There is no abdominal tenderness.  Musculoskeletal:     Cervical back: Neck supple.     Right lower leg: No edema.     Left lower leg: No edema.     Comments: Right hemiplegia Right upper extremity contracture   Lymphadenopathy:     Cervical: No cervical adenopathy.  Skin:    General: Skin is warm and dry.  Neurological:     Mental Status: She is alert. Mental status is at baseline.  Psychiatric:        Mood and Affect: Mood normal.       ASSESSMENT/ PLAN:  TODAY  1. 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 Major depression recurrent chronic  There is no change in her status.  Will stop zoloft per her request Will continue current dose of buspar and ativan will monitor her status.    Synthia Innocent NP [facility]  Contact [phone] Monday through Friday 8am- 5pm  After hours call [phone]

## 2020-09-28 ENCOUNTER — Other Ambulatory Visit: Payer: Self-pay | Admitting: Adult Health

## 2020-09-28 DIAGNOSIS — F331 Major depressive disorder, recurrent, moderate: Secondary | ICD-10-CM | POA: Diagnosis not present

## 2020-09-28 MED ORDER — LORAZEPAM 0.5 MG PO TABS: 0.2500 mg | ORAL_TABLET | Freq: Every day | ORAL | 0 refills | Status: DC

## 2020-10-03 ENCOUNTER — Encounter: Payer: Self-pay | Admitting: Adult Health

## 2020-10-03 ENCOUNTER — Non-Acute Institutional Stay (SKILLED_NURSING_FACILITY): Payer: Medicare Other | Admitting: Adult Health

## 2020-10-03 DIAGNOSIS — F331 Major depressive disorder, recurrent, moderate: Secondary | ICD-10-CM

## 2020-10-03 DIAGNOSIS — I69351 Hemiplegia and hemiparesis following cerebral infarction affecting right dominant side: Secondary | ICD-10-CM

## 2020-10-03 DIAGNOSIS — I615 Nontraumatic intracerebral hemorrhage, intraventricular: Secondary | ICD-10-CM

## 2020-10-03 NOTE — Progress Notes (Signed)
Location:  [facility] Room Number: 114-W Place of Service:  SNF (31)   CODE STATUS: DNR  Allergies  Allergen Reactions  . Contrast Media [Iodinated Diagnostic Agents] Anaphylaxis    Chief Complaint  Patient presents with  . 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 She wants her ativan increased; she is somewhat obsessive about this medication. There are no reports of uncontrolled pain; no changes in appetite.   Past Medical History:  Diagnosis Date  . Arthritis   . Coronary artery disease    angina  . Diabetes mellitus without complication (HCC)   . Hypertension     Past Surgical History:  Procedure Laterality Date  . APPENDECTOMY    . CHOLECYSTECTOMY      Social History   Socioeconomic History  . Marital status: Widowed    Spouse name: Not on file  . Number of children: Not on file  . Years of education: Not on file  . Highest education level: Not on file  Occupational History  . Not on file  Tobacco Use  . Smoking status: Never Smoker  . Smokeless tobacco: Never Used  Vaping Use  . Vaping Use: Never used  Substance and Sexual Activity  . Alcohol use: No  . Drug use: No  . Sexual activity: Not on file  Other Topics Concern  . Not on file  Social History Narrative  . Not on file   Social Determinants of Health   Financial Resource Strain: Not on file  Food Insecurity: Not on file  Transportation Needs: Not on file  Physical  Activity: Not on file  Stress: Not on file  Social Connections: Not on file  Intimate Partner Violence: Not on file   History reviewed. No pertinent family history.    VITAL SIGNS BP 117/67   Pulse (!) 54   Temp 98 F (36.7 C)   Resp 20   Ht 5\' 4"  (1.626 m)   Wt 164 lb 3.2 oz (74.5 kg)   SpO2 97%   BMI 28.18 kg/m   Outpatient Encounter Medications as of 10/03/2020  Medication Sig  . acetaminophen (TYLENOL) 325 MG tablet Take 650 mg by mouth every 6 (six) hours as needed.   10/05/2020 amLODipine (NORVASC) 10 MG tablet Take 1 tablet (10 mg total) by mouth daily.  [name] atorvastatin (LIPITOR) 20 MG tablet Take 1 tablet (20 mg total) by mouth daily at 6 PM.  . Balsam [place]-Castor Oil (VENELEX) OINT Apply topically. Apply to sacrum and bilateral buttocks qshift for prevention.  . busPIRone (BUSPAR) 7.5 MG tablet Take 7.5 mg by mouth 2 (two) times daily. For Anxiety  . ketoconazole (NIZORAL) 2 % cream Apply 1 application topically 2 (two) times daily. Special Instructions: To rash left forearm, Clean well with soap and water apply Nizoral 2% Cream BID for 14 days.  [name] lisinopril (ZESTRIL) 10 MG tablet Take 10 mg by mouth daily.  [name] loratadine (CLARITIN) 10 MG tablet Take 10 mg by mouth  daily. For Hives  . LORazepam (ATIVAN) 0.5 MG tablet Take 0.5 tablets (0.25 mg total) by mouth at bedtime.  . melatonin 3 MG TABS tablet Take 3 mg by mouth at bedtime.  . metFORMIN (GLUCOPHAGE) 500 MG tablet Take 1 tablet by mouth 2 (two) times daily.  . NON FORMULARY Diet Change: Regular, carbohydrate consistent with thin liquids  . NON FORMULARY Accu-check qam. Notify provider of results under 60 or over 400. Once A Day  . Soap & Cleansers (CETAPHIL GENTLE CLEANSER EX) Apply 1 application topically daily. Wash skin with cleanser   No facility-administered encounter medications on file as of 10/03/2020.     SIGNIFICANT DIAGNOSTIC EXAMS   PREVIOUS   07-26-18: 2-d echo:  Left Ventricle: Left ventricular ejection  fraction, by visual estimation, is 65 to 70%. The left ventricle has normal function. The left ventricle is not well visualized. There is moderately increased left ventricular hypertrophy. Left ventricular  diastolic parameters are consistent with Grade I diastolic dysfunction (impaired relaxation). Elevated left ventricular end-diastolic pressure.   Right Ventricle: The right ventricular size is normal. No increase in right ventricular wall thickness. Global RV systolic function is has normal systolic function. Left Atrium: Left atrial size was normal in size. Right Atrium: Right atrial size was normal in size Pericardium: There is no evidence of pericardial effusion. Mitral Valve: The mitral valve is normal in structure. No evidence of mitral valve regurgitation. No evidence of mitral valve stenosis by observation. MV peak gradient, 7.4 mmHg. Tricuspid Valve: The tricuspid valve is normal in structure. Tricuspid valve regurgitation is trivial.   Aortic Valve: The aortic valve was not well visualized. Aortic valve regurgitation is not visualized. Mild aortic valve sclerosis is present, with no evidence of aortic valve stenosis. Aortic valve mean gradient measures 6.0 mmHg. Aortic valve peak  gradient measures 11.0 mmHg. Aortic valve area, by VTI measures 2.05 cm.  07-31-19: MRI/MRA of head:  Parenchymal hemorrhage involving the left thalamus and adjacent white matter with intraventricular extension. Associated mass effect is similar to recent CT. No hydrocephalus. Moderate chronic microvascular ischemic changes. Mild irregularity and stenosis of left P1 PCA. Otherwise unremarkable MRA of the head.  02-03-20: DEXA t score -1.009  03-14-20: ct of head and cervical spine:  1. No evidence of acute intracranial process. 2. No acute cervical spine fracture or listhesis. 3. Small soft tissue laceration at the right parietal scalp at the vertex with associated small subcutaneous hematoma and small right  parietal scalp hematoma. 4. Moderate to severe multilevel degenerative disc disease of the cervical spine, most severe at C4-5 where there is a large herniated disc material. The AP diameter of the spinal canal in this region is approximately 5-6 mm. Multilevel mild neural foraminal narrowing, most severe on the left at C6-7 where there is also uncovertebral and facet arthrosis. 5. Moderate periventricular white matter changes likely reflecting the sequela of small vessel ischemia. Previously noted intraparenchymal hematoma within the left basal ganglia and intraventricular hemorrhage has resolved. There is residual encephalomalacia within the posterior limb of the left internal capsule and left thalamus.   NO NEW EXAMS.   LABS REVIEWED PREVIOUS  12-16-19: urine micro-albumin 8.3 ( on ACE)  hgb a1c 6.9 12-23-19: chol 94; ldl 33 trig 93 hdl 42 vit B 12: 371 02-03-20: wbc 7.9; hgb 13.5; hct 42.3; mcv 93.8 plt 223; glucose  129; bun 11; creat 0.44; k+ 4.1; na++ 133 ca 9.0 liver normal albumin 3.1  05-04-20: hgb a1c 6.4  NO NEW LABS   Review of Systems  Constitutional: Negative for malaise/fatigue.  Respiratory: Negative for cough and shortness of breath.   Cardiovascular: Negative for chest pain, palpitations and leg swelling.  Gastrointestinal: Negative for abdominal pain, constipation and heartburn.  Musculoskeletal: Negative for back pain, joint pain and myalgias.  Skin: Negative.   Neurological: Negative for dizziness.  Psychiatric/Behavioral: The patient is not nervous/anxious.    .   Physical Exam Constitutional:      General: She is not in acute distress.    Appearance: She is well-developed. She is not diaphoretic.  Neck:     Thyroid: No thyromegaly.  Cardiovascular:     Rate and Rhythm: Normal rate and regular rhythm.     Pulses: Normal pulses.     Heart sounds: Normal heart sounds.  Pulmonary:     Effort: Pulmonary effort is normal. No respiratory distress.     Breath  sounds: Normal breath sounds.  Abdominal:     General: Bowel sounds are normal. There is no distension.     Palpations: Abdomen is soft.     Tenderness: There is no abdominal tenderness.  Musculoskeletal:     Cervical back: Neck supple.     Right lower leg: No edema.     Left lower leg: No edema.     Comments: Right hemiplegia Right upper extremity contracture    Lymphadenopathy:     Cervical: No cervical adenopathy.  Skin:    General: Skin is warm and dry.  Neurological:     Mental Status: She is alert. Mental status is at baseline.  Psychiatric:        Mood and Affect: Mood normal.     ASSESSMENT/ PLAN:  TODAY  1. Moderate episode of current major depressive disorder: is stable is off zoloft per her choice; will continue ativan 0.25 mg nightly (has failed one wean); buspar 7.5 mg twice daily will at times decline medications.   2. 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 Dysphagia due to CVA: is stable no sings of aspiration present; will continue thin liquids.   4. Dyslipidemia associated with type 2 diabetes mellitus is stable LDL 33 will continue lipitor 20 mg daily   5. Chronic constipation: is stable will continue senna s twice daily   6. Hypertension associated with type 2 diabetes mellitus: is stable b/p 117/67 will continue lisinopril 10 mg daily norvasc 10 mg daily   7. Type 2 diabetes mellitus with peripheral artery disease: is stable hgb a1c 6.4 will continue metformin 500 mg twice daily is on ace statin no asa          Synthia Innocent NP [facility]  Contact [phone] Monday through Friday 8am- 5pm  After hours call [phone]

## 2020-10-23 ENCOUNTER — Encounter: Payer: Self-pay | Admitting: Adult Health

## 2020-10-23 ENCOUNTER — Non-Acute Institutional Stay (SKILLED_NURSING_FACILITY): Payer: Medicare Other | Admitting: Adult Health

## 2020-10-23 ENCOUNTER — Other Ambulatory Visit: Payer: Self-pay | Admitting: Adult Health

## 2020-10-23 DIAGNOSIS — Z0000 Encounter for general adult medical examination without abnormal findings: Secondary | ICD-10-CM

## 2020-10-23 MED ORDER — LORAZEPAM 0.5 MG PO TABS: 0.2500 mg | ORAL_TABLET | Freq: Every day | ORAL | 0 refills | Status: DC

## 2020-10-23 NOTE — Progress Notes (Signed)
Subjective:   [name] [name] is a 87 y.o. female who presents for Medicare Annual (Subsequent) preventive examination.  Review of Systems    Review of Systems  Constitutional: Negative for malaise/fatigue.  Respiratory: Negative for cough and shortness of breath.   Cardiovascular: Negative for chest pain, palpitations and leg swelling.  Gastrointestinal: Negative for abdominal pain, constipation and heartburn.  Musculoskeletal: Negative for back pain, joint pain and myalgias.  Skin: Negative.   Neurological: Negative for dizziness.  Psychiatric/Behavioral: The patient is nervous/anxious.    Cardiac Risk Factors include: advanced age (>64men, >52 women);diabetes mellitus;dyslipidemia;obesity (BMI >30kg/m2);sedentary lifestyle     Objective:    Today's Vitals   10/23/20 0956 10/23/20 1433  BP: (!) 129/53   Pulse: 65   Resp: 20   Temp: 97.8 F (36.6 C)   Weight: 164 lb 3.2 oz (74.5 kg)   Height: 5\' 4"  (1.626 m)   PainSc:  0-No pain   Body mass index is 28.18 kg/m.  Advanced Directives 10/23/2020 10/03/2020 09/27/2020 09/12/2020 08/16/2020 08/03/2020 07/31/2020  Does Patient Have a Medical Advance Directive? Yes Yes Yes Yes Yes Yes Yes  Type of Advance Directive Out of facility DNR (pink MOST or yellow form) Out of facility DNR (pink MOST or yellow form) Out of facility DNR (pink MOST or yellow form) Out of facility DNR (pink MOST or yellow form) Out of facility DNR (pink MOST or yellow form) Out of facility DNR (pink MOST or yellow form) Out of facility DNR (pink MOST or yellow form)  Does patient want to make changes to medical advance directive? No - Patient declined No - Patient declined No - Patient declined Yes (ED - send information to MyChart) No - Patient declined No - Patient declined No - Patient declined  Would patient like information on creating a medical advance directive? - - - - - - -  Pre-existing out of facility DNR order (yellow form or pink MOST form) - - - - -  - -    Current Medications (verified) Outpatient Encounter Medications as of 10/23/2020  Medication Sig  . acetaminophen (TYLENOL) 325 MG tablet Take 650 mg by mouth every 6 (six) hours as needed.   12/23/2020 amLODipine (NORVASC) 10 MG tablet Take 1 tablet (10 mg total) by mouth daily.  [name] atorvastatin (LIPITOR) 20 MG tablet Take 1 tablet (20 mg total) by mouth daily at 6 PM.  . Balsam [place]-Castor Oil (VENELEX) OINT Apply topically. Apply to sacrum and bilateral buttocks qshift for prevention.  . busPIRone (BUSPAR) 7.5 MG tablet Take 7.5 mg by mouth 2 (two) times daily. For Anxiety  . lisinopril (ZESTRIL) 10 MG tablet Take 10 mg by mouth daily.  . melatonin 3 MG TABS tablet Take 3 mg by mouth at bedtime.  . metFORMIN (GLUCOPHAGE) 500 MG tablet Take 1 tablet by mouth 2 (two) times daily.  . NON FORMULARY Diet Change: Regular, carbohydrate consistent with thin liquids  . NON FORMULARY Accu-check qam. Notify provider of results under 60 or over 400. Once A Day  . Soap & Cleansers (CETAPHIL GENTLE CLEANSER EX) Apply 1 application topically daily. Wash skin with cleanser  . [DISCONTINUED] LORazepam (ATIVAN) 0.5 MG tablet Take 0.5 tablets (0.25 mg total) by mouth at bedtime.  . [DISCONTINUED] ketoconazole (NIZORAL) 2 % cream Apply 1 application topically 2 (two) times daily. Special Instructions: To rash left forearm, Clean well with soap and water apply Nizoral 2% Cream BID for 14 days.  . [DISCONTINUED] loratadine (CLARITIN) 10  MG tablet Take 10 mg by mouth daily. For Hives   No facility-administered encounter medications on file as of 10/23/2020.    Allergies (verified) Contrast media [iodinated diagnostic agents]   History: Past Medical History:  Diagnosis Date  . Arthritis   . Coronary artery disease    angina  . Diabetes mellitus without complication (HCC)   . Hypertension    Past Surgical History:  Procedure Laterality Date  . APPENDECTOMY    . CHOLECYSTECTOMY     History reviewed. No  pertinent family history. Social History   Socioeconomic History  . Marital status: Widowed    Spouse name: Not on file  . Number of children: Not on file  . Years of education: Not on file  . Highest education level: Not on file  Occupational History  . Not on file  Tobacco Use  . Smoking status: Never Smoker  . Smokeless tobacco: Never Used  Vaping Use  . Vaping Use: Never used  Substance and Sexual Activity  . Alcohol use: No  . Drug use: No  . Sexual activity: Not on file  Other Topics Concern  . Not on file  Social History Narrative  . Not on file   Social Determinants of Health   Financial Resource Strain: Not on file  Food Insecurity: Not on file  Transportation Needs: Not on file  Physical Activity: Not on file  Stress: Not on file  Social Connections: Not on file    Tobacco Counseling Counseling given: Not Answered   Clinical Intake:  Pre-visit preparation completed: Yes  Pain : No/denies pain Pain Score: 0-No pain     BMI - recorded: 28.18 Nutritional Status: BMI 25 -29 Overweight Nutritional Risks: Unintentional weight gain Diabetes: Yes CBG done?: Yes CBG resulted in Enter/ Edit results?: Yes Did pt. bring in CBG monitor from home?: No  How often do you need to have someone help you when you read instructions, pamphlets, or other written materials from your doctor or pharmacy?: 5 - Always  Diabetic?yes   Interpreter Needed?: No      Activities of Daily Living In your present state of health, do you have any difficulty performing the following activities: 10/23/2020  Hearing? N  Vision? N  Difficulty concentrating or making decisions? Y  Walking or climbing stairs? N  Dressing or bathing? N  Doing errands, shopping? N  Preparing Food and eating ? Y  Using the Toilet? Y  In the past six months, have you accidently leaked urine? Y  Do you have problems with loss of bowel control? Y  Managing your Medications? Y  Managing your  Finances? Y  Housekeeping or managing your Housekeeping? Y  Some recent data might be hidden    Patient Care Team: Sharee Holster, NP as PCP - [facility] ([facility]  Indicate any recent Medical Services you may have received from other than [facility] providers in the past year (date may be approximate).     Assessment:   This is a routine wellness examination for [place].  Hearing/Vision screen No exam data present  Dietary issues and exercise activities discussed: Current Exercise Habits: The patient does not participate in regular exercise at present, Exercise limited by: neurologic condition(s)  Goals    . Absence of Fall and Fall-Related Injury     Evidence-based guidance:   Assess fall risk using a validated tool when available. Consider balance and gait impairment, muscle weakness, diminished vision or hearing, environmental  hazards, presence of urinary or bowel urgency and/or incontinence.   Communicate fall injury risk to interprofessional healthcare team.   Develop a fall prevention plan with the patient and family.   Promote use of personal vision and auditory aids.   Promote reorientation, appropriate sensory stimulation, and routines to decrease risk of fall when changes in mental status are present.   Assess assistance level required for safe and effective self-care; consider referral for home care.   Encourage physical activity, such as performance of self-care at highest level of ability, strength and balance exercise program, and provision of appropriate assistive devices; refer to rehabilitation therapy.   Refer to community-based fall prevention program where available.   If fall occurs, determine the cause and revise fall injury prevention plan.   Regularly review medication contribution to fall risk; consider risk related to polypharmacy and age.   Refer to pharmacist for consultation when concerns  about medications are revealed.   Balance adequate pain management with potential for oversedation.   Provide guidance related to environmental modifications.   Consider supplementation with Vitamin D.   Notes:     . DIET - INCREASE WATER INTAKE    . Follow up with Primary Care Provider    . General - Client will not be readmitted within 30 days ([facility])      Depression Screen PHQ 2/9 Scores 10/23/2020 10/19/2019  PHQ - 2 Score 2 -  PHQ- 9 Score 4 -  Exception Documentation - Other- indicate reason in comment box  Not completed - patient unable to participate    Fall Risk Fall Risk  10/23/2020 10/19/2019 02/19/2019  Falls in the past year? 0 0 0  Comment - - Emmi Telephone Survey: data to providers prior to load  Number falls in past yr: 0 - -  Injury with Fall? 0 - -  Risk for fall due to : Impaired balance/gait;Impaired mobility - -    FALL RISK PREVENTION PERTAINING TO THE HOME:  Any stairs in or around the home?no If so, are there any without handrails? n/a Home free of loose throw rugs in walkways, pet beds, electrical cords, etc? yes Adequate lighting in your home to reduce risk of falls? Yes   ASSISTIVE DEVICES UTILIZED TO PREVENT FALLS:  Life alert? no Use of a cane, walker or w/c? Yes  Grab bars in the bathroom? yes Shower chair or bench in shower? Yes  Elevated toilet seat or a handicapped toilet?yes   TIMED UP AND GO:  Was the test performed? no is nonambulatory   Cognitive Function: MMSE - Mini Mental State Exam 10/23/2020  Not completed: Refused     6CIT Screen 10/23/2020  What Year? 0 points  What month? 0 points  What time? 0 points  Count back from 20 2 points  Months in reverse 2 points  Repeat phrase 4 points  Total Score 8    Immunizations  There is no immunization history on file for this patient.   Screening Tests Health Maintenance  Topic Date Due  . URINE MICROALBUMIN  12/15/2020  . COVID-19 Vaccine (1) 11/08/2020 (Originally  04/26/1938)  . PNA vac Low Risk Adult (1 of 2 - PCV13) 06/13/2021 (Originally 04/26/2000)  . TETANUS/TDAP  07/04/2021 (Originally 04/26/1952)  . HEMOGLOBIN A1C  11/02/2020  . FOOT EXAM  11/17/2020  . INFLUENZA VACCINE  02/19/2021  . OPHTHALMOLOGY EXAM  03/03/2021  . DEXA SCAN  Completed  . HPV VACCINES  Aged Out    Health Maintenance  Health Maintenance Due  Topic Date Due  . URINE MICROALBUMIN  12/15/2020     Lung Cancer Screening: (Low Dose CT Chest recommended if Age 68-80 years, 30 pack-year currently smoking OR have quit w/in 15years.)does not  qualify.   Lung Cancer Screening Referral:no   Additional Screening:  Hepatitis C Screening: does not  qualify; Completed   Vision Screening: Recommended annual ophthalmology exams for early detection of glaucoma and other disorders of the eye. Is the patient up to date with their annual eye exam? yes  Who is the provider or what is the name of the office in which the patient attends annual eye exams? Per facility  If pt is not established with a provider, would they like to be referred to a provider to establish care?n/a  Dental Screening: Recommended annual dental exams for proper oral hygiene  Community Resource Referral / Chronic Care Management: CRR required this visit?  no  CCM required this visit? no     Plan:     I have personally reviewed and noted the following in the patient's chart:   . Medical and social history . Use of alcohol, tobacco or illicit drugs  . Current medications and supplements . Functional ability and status . Nutritional status . Physical activity . Advanced directives . List of other physicians . Hospitalizations, surgeries, and ER visits in previous 12 months . Vitals . Screenings to include cognitive, depression, and falls . Referrals and appointments  In addition, I have reviewed and discussed with patient certain preventive protocols, quality metrics, and best practice  recommendations. A written personalized care plan for preventive services as well as general preventive health recommendations were provided to patient.     Sharee Holstereborah S Bradee Common, NP   10/23/2020

## 2020-10-23 NOTE — Patient Instructions (Signed)
  [name] [name] , Thank you for taking time to come for your Medicare Wellness Visit. I appreciate your ongoing commitment to your health goals. Please review the following plan we discussed and let me know if I can assist you in the future.   These are the goals we discussed: Goals    . Absence of Fall and Fall-Related Injury     Evidence-based guidance:   Assess fall risk using a validated tool when available. Consider balance and gait impairment, muscle weakness, diminished vision or hearing, environmental hazards, presence of urinary or bowel urgency and/or incontinence.   Communicate fall injury risk to interprofessional healthcare team.   Develop a fall prevention plan with the patient and family.   Promote use of personal vision and auditory aids.   Promote reorientation, appropriate sensory stimulation, and routines to decrease risk of fall when changes in mental status are present.   Assess assistance level required for safe and effective self-care; consider referral for home care.   Encourage physical activity, such as performance of self-care at highest level of ability, strength and balance exercise program, and provision of appropriate assistive devices; refer to rehabilitation therapy.   Refer to community-based fall prevention program where available.   If fall occurs, determine the cause and revise fall injury prevention plan.   Regularly review medication contribution to fall risk; consider risk related to polypharmacy and age.   Refer to pharmacist for consultation when concerns about medications are revealed.   Balance adequate pain management with potential for oversedation.   Provide guidance related to environmental modifications.   Consider supplementation with Vitamin D.   Notes:     . DIET - INCREASE WATER INTAKE    . Follow up with Primary Care Provider    . General - Client will not be readmitted within 30 days ([facility])       This is a list of the  screening recommended for you and due dates:  Health Maintenance  Topic Date Due  . Urine Protein Check  12/15/2020  . COVID-19 Vaccine (1) 11/08/2020*  . Pneumonia vaccines (1 of 2 - PCV13) 06/13/2021*  . Tetanus Vaccine  07/04/2021*  . Hemoglobin A1C  11/02/2020  . Complete foot exam   11/17/2020  . Flu Shot  02/19/2021  . Eye exam for diabetics  03/03/2021  . DEXA scan (bone density measurement)  Completed  . HPV Vaccine  Aged Out  *Topic was postponed. The date shown is not the original due date.

## 2020-10-25 ENCOUNTER — Encounter: Payer: Self-pay | Admitting: Adult Health

## 2020-10-25 DIAGNOSIS — F01518 Vascular dementia, unspecified severity, with other behavioral disturbance: Secondary | ICD-10-CM | POA: Insufficient documentation

## 2020-10-25 DIAGNOSIS — F0151 Vascular dementia with behavioral disturbance: Secondary | ICD-10-CM | POA: Insufficient documentation

## 2020-10-26 ENCOUNTER — Non-Acute Institutional Stay (SKILLED_NURSING_FACILITY): Payer: Medicare Other | Admitting: Adult Health

## 2020-10-26 ENCOUNTER — Encounter: Payer: Self-pay | Admitting: Adult Health

## 2020-10-26 DIAGNOSIS — F323 Major depressive disorder, single episode, severe with psychotic features: Secondary | ICD-10-CM | POA: Diagnosis not present

## 2020-10-26 DIAGNOSIS — E1151 Type 2 diabetes mellitus with diabetic peripheral angiopathy without gangrene: Secondary | ICD-10-CM

## 2020-10-26 DIAGNOSIS — F0151 Vascular dementia with behavioral disturbance: Secondary | ICD-10-CM

## 2020-10-26 DIAGNOSIS — F01518 Vascular dementia, unspecified severity, with other behavioral disturbance: Secondary | ICD-10-CM

## 2020-10-26 NOTE — Progress Notes (Signed)
Location:  [facility] Room Number: 222 Place of Service:  SNF ([id number])   CODE STATUS: dnr  Allergies  Allergen Reactions  . Contrast Media [Iodinated Diagnostic Agents] Anaphylaxis    Chief Complaint  Patient presents with  . Acute Visit    Care plan meeting     HPI:  We have come together for her care plan meeting. No family BIMS 15/15 mood 7/30. She is extensive to assist to dependent with her adls. She does feed herself; is incontinent of bladder and bowel. She is nonambulatory and spends nearly all of her time in bed per her choice. There have been no recent falls. Her cbg readings are stable she has an orthotic on her right wrist which she will decline at times. She will decline her medications on a frequent basis. She is fixated on a fall that happened last August (2021). She states that she has had severe headaches since her fall; but has not told anyone. She is due for ct of head and c-spine. She is complaining of anxiety; but is declining her buspar; she is wanting her ativan returned. She continues to be followed for her chronic illnesses including: Major depression with psychotic features   Vascular dementia with behavioral disturbance   Diabetes mellitus type 2 with peripheral artery disease   Past Medical History:  Diagnosis Date  . Arthritis   . Coronary artery disease    angina  . Diabetes mellitus without complication (HCC)   . Hypertension     Past Surgical History:  Procedure Laterality Date  . APPENDECTOMY    . CHOLECYSTECTOMY      Social History   Socioeconomic History  . Marital status: Widowed    Spouse name: Not on file  . Number of children: Not on file  . Years of education: Not on file  . Highest education level: Not on file  Occupational History  . Not on file  Tobacco Use  . Smoking status: Never Smoker  . Smokeless tobacco: Never Used  Vaping Use  . Vaping Use: Never used  Substance and Sexual Activity  .  Alcohol use: No  . Drug use: No  . Sexual activity: Not on file  Other Topics Concern  . Not on file  Social History Narrative  . Not on file   Social Determinants of Health   Financial Resource Strain: Not on file  Food Insecurity: Not on file  Transportation Needs: Not on file  Physical Activity: Not on file  Stress: Not on file  Social Connections: Not on file  Intimate Partner Violence: Not on file   History reviewed. No pertinent family history.    VITAL SIGNS BP (!) 129/53   Pulse 65   Temp 98 F (36.7 C)   Resp 18   Ht 5\' 4"  (1.626 m)   Wt 164 lb 3.2 oz (74.5 kg)   BMI 28.18 kg/m   Outpatient Encounter Medications as of 10/26/2020  Medication Sig  . acetaminophen (TYLENOL) 325 MG tablet Take 650 mg by mouth every 6 (six) hours as needed.   12/26/2020 amLODipine (NORVASC) 10 MG tablet Take 1 tablet (10 mg total) by mouth daily.  [name] atorvastatin (LIPITOR) 20 MG tablet Take 1 tablet (20 mg total) by mouth daily at 6 PM.  . Balsam [place]-Castor Oil (VENELEX) OINT Apply topically. Apply to sacrum and bilateral buttocks qshift for prevention.  . busPIRone (BUSPAR) 7.5 MG tablet Take 7.5 mg by mouth 2 (two)  times daily. For Anxiety  . lisinopril (ZESTRIL) 10 MG tablet Take 10 mg by mouth daily.  [name] LORazepam (ATIVAN) 0.5 MG tablet Take 0.5 tablets (0.25 mg total) by mouth at bedtime.  . melatonin 3 MG TABS tablet Take 3 mg by mouth at bedtime.  . metFORMIN (GLUCOPHAGE) 500 MG tablet Take 1 tablet by mouth 2 (two) times daily.  . NON FORMULARY Diet Change: Regular, carbohydrate consistent with thin liquids  . NON FORMULARY Accu-check qam. Notify provider of results under 60 or over 400. Once A Day  . Soap & Cleansers (CETAPHIL GENTLE CLEANSER EX) Apply 1 application topically daily. Wash skin with cleanser   No facility-administered encounter medications on file as of 10/26/2020.     SIGNIFICANT DIAGNOSTIC EXAMS   PREVIOUS   07-26-18: 2-d echo:  Left Ventricle: Left ventricular  ejection fraction, by visual estimation, is 65 to 70%. The left ventricle has normal function. The left ventricle is not well visualized. There is moderately increased left ventricular hypertrophy. Left ventricular  diastolic parameters are consistent with Grade I diastolic dysfunction (impaired relaxation). Elevated left ventricular end-diastolic pressure.   Right Ventricle: The right ventricular size is normal. No increase in right ventricular wall thickness. Global RV systolic function is has normal systolic function. Left Atrium: Left atrial size was normal in size. Right Atrium: Right atrial size was normal in size Pericardium: There is no evidence of pericardial effusion. Mitral Valve: The mitral valve is normal in structure. No evidence of mitral valve regurgitation. No evidence of mitral valve stenosis by observation. MV peak gradient, 7.4 mmHg. Tricuspid Valve: The tricuspid valve is normal in structure. Tricuspid valve regurgitation is trivial.   Aortic Valve: The aortic valve was not well visualized. Aortic valve regurgitation is not visualized. Mild aortic valve sclerosis is present, with no evidence of aortic valve stenosis. Aortic valve mean gradient measures 6.0 mmHg. Aortic valve peak  gradient measures 11.0 mmHg. Aortic valve area, by VTI measures 2.05 cm.  07-31-19: MRI/MRA of head:  Parenchymal hemorrhage involving the left thalamus and adjacent white matter with intraventricular extension. Associated mass effect is similar to recent CT. No hydrocephalus. Moderate chronic microvascular ischemic changes. Mild irregularity and stenosis of left P1 PCA. Otherwise unremarkable MRA of the head.  02-03-20: DEXA t score -1.009  03-14-20: ct of head and cervical spine:  1. No evidence of acute intracranial process. 2. No acute cervical spine fracture or listhesis. 3. Small soft tissue laceration at the right parietal scalp at the vertex with associated small subcutaneous hematoma and  small right parietal scalp hematoma. 4. Moderate to severe multilevel degenerative disc disease of the cervical spine, most severe at C4-5 where there is a large herniated disc material. The AP diameter of the spinal canal in this region is approximately 5-6 mm. Multilevel mild neural foraminal narrowing, most severe on the left at C6-7 where there is also uncovertebral and facet arthrosis. 5. Moderate periventricular white matter changes likely reflecting the sequela of small vessel ischemia. Previously noted intraparenchymal hematoma within the left basal ganglia and intraventricular hemorrhage has resolved. There is residual encephalomalacia within the posterior limb of the left internal capsule and left thalamus.   NO NEW EXAMS.   LABS REVIEWED PREVIOUS   SHE IS DECLINING BLOOD DRAWS   12-16-19: urine micro-albumin 8.3 ( on ACE)  hgb a1c 6.9 12-23-19: chol 94; ldl 33 trig 93 hdl 42 vit B 12: 371 02-03-20: wbc 7.9; hgb 13.5; hct 42.3; mcv 93.8 plt 223; glucose  129; bun 11; creat 0.44; k+ 4.1; na++ 133 ca 9.0 liver normal albumin 3.1  05-04-20: hgb a1c 6.4   NO NEW LABS   Review of Systems  Constitutional: Negative for malaise/fatigue.  Respiratory: Negative for cough and shortness of breath.   Cardiovascular: Negative for chest pain, palpitations and leg swelling.  Gastrointestinal: Negative for abdominal pain, constipation and heartburn.  Musculoskeletal: Negative for back pain, joint pain and myalgias.  Skin: Negative.   Neurological: Positive for headaches. Negative for dizziness.  Psychiatric/Behavioral: The patient is not nervous/anxious.     Physical Exam Constitutional:      General: She is not in acute distress.    Appearance: She is well-developed. She is not diaphoretic.  Neck:     Thyroid: No thyromegaly.  Cardiovascular:     Rate and Rhythm: Normal rate and regular rhythm.     Pulses: Normal pulses.     Heart sounds: Normal heart sounds.  Pulmonary:     Effort:  Pulmonary effort is normal. No respiratory distress.     Breath sounds: Normal breath sounds.  Abdominal:     General: Bowel sounds are normal. There is no distension.     Palpations: Abdomen is soft.     Tenderness: There is no abdominal tenderness.  Musculoskeletal:     Cervical back: Neck supple.     Right lower leg: No edema.     Left lower leg: No edema.     Comments: Right hemiplegia Right upper extremity contracture  Lymphadenopathy:     Cervical: No cervical adenopathy.  Skin:    General: Skin is warm and dry.  Neurological:     Mental Status: She is alert and oriented to person, place, and time.  Psychiatric:        Mood and Affect: Mood normal.        ASSESSMENT/ PLAN:  TODAY  1. Major depression with psychotic features 2. Vascular dementia with behavioral disturbance 3. Diabetes mellitus type 2 with peripheral artery disease  Will change her buspar to 15 mg twice daily  Will continue current plan of care Will continue to monitor her status.   Time spent with patient 40 minutes: >50 % of time spent with counseling and coordination of care to include medications; goals of care.    Synthia Innocent NP [facility]  Contact [phone] Monday through Friday 8am- 5pm  After hours call [phone]

## 2020-10-27 DIAGNOSIS — F331 Major depressive disorder, recurrent, moderate: Secondary | ICD-10-CM | POA: Diagnosis not present

## 2020-10-31 ENCOUNTER — Ambulatory Visit (HOSPITAL_COMMUNITY)
Admission: RE | Admit: 2020-10-31 | Discharge: 2020-10-31 | Disposition: A | Discharge status: Home / Self Care | Payer: Medicare Other | Source: Ambulatory Visit | Attending: Adult Health | Admitting: Adult Health

## 2020-10-31 DIAGNOSIS — R52 Pain, unspecified: Secondary | ICD-10-CM | POA: Insufficient documentation

## 2020-10-31 DIAGNOSIS — M50221 Other cervical disc displacement at C4-C5 level: Secondary | ICD-10-CM | POA: Diagnosis not present

## 2020-10-31 DIAGNOSIS — I6782 Cerebral ischemia: Secondary | ICD-10-CM | POA: Diagnosis not present

## 2020-10-31 DIAGNOSIS — I69351 Hemiplegia and hemiparesis following cerebral infarction affecting right dominant side: Secondary | ICD-10-CM | POA: Diagnosis not present

## 2020-10-31 DIAGNOSIS — R519 Headache, unspecified: Secondary | ICD-10-CM | POA: Diagnosis not present

## 2020-10-31 DIAGNOSIS — R1312 Dysphagia, oropharyngeal phase: Secondary | ICD-10-CM | POA: Diagnosis not present

## 2020-10-31 DIAGNOSIS — M542 Cervicalgia: Secondary | ICD-10-CM | POA: Diagnosis not present

## 2020-10-31 DIAGNOSIS — M5030 Other cervical disc degeneration, unspecified cervical region: Secondary | ICD-10-CM | POA: Diagnosis not present

## 2020-11-01 DIAGNOSIS — R1312 Dysphagia, oropharyngeal phase: Secondary | ICD-10-CM | POA: Diagnosis not present

## 2020-11-01 DIAGNOSIS — I69351 Hemiplegia and hemiparesis following cerebral infarction affecting right dominant side: Secondary | ICD-10-CM | POA: Diagnosis not present

## 2020-11-02 DIAGNOSIS — I69351 Hemiplegia and hemiparesis following cerebral infarction affecting right dominant side: Secondary | ICD-10-CM | POA: Diagnosis not present

## 2020-11-02 DIAGNOSIS — R1312 Dysphagia, oropharyngeal phase: Secondary | ICD-10-CM | POA: Diagnosis not present

## 2020-11-03 DIAGNOSIS — I69351 Hemiplegia and hemiparesis following cerebral infarction affecting right dominant side: Secondary | ICD-10-CM | POA: Diagnosis not present

## 2020-11-03 DIAGNOSIS — R1312 Dysphagia, oropharyngeal phase: Secondary | ICD-10-CM | POA: Diagnosis not present

## 2020-11-04 DIAGNOSIS — I69351 Hemiplegia and hemiparesis following cerebral infarction affecting right dominant side: Secondary | ICD-10-CM | POA: Diagnosis not present

## 2020-11-04 DIAGNOSIS — R1312 Dysphagia, oropharyngeal phase: Secondary | ICD-10-CM | POA: Diagnosis not present

## 2020-11-06 DIAGNOSIS — I69351 Hemiplegia and hemiparesis following cerebral infarction affecting right dominant side: Secondary | ICD-10-CM | POA: Diagnosis not present

## 2020-11-06 DIAGNOSIS — R1312 Dysphagia, oropharyngeal phase: Secondary | ICD-10-CM | POA: Diagnosis not present

## 2020-11-07 DIAGNOSIS — R1312 Dysphagia, oropharyngeal phase: Secondary | ICD-10-CM | POA: Diagnosis not present

## 2020-11-07 DIAGNOSIS — I69351 Hemiplegia and hemiparesis following cerebral infarction affecting right dominant side: Secondary | ICD-10-CM | POA: Diagnosis not present

## 2020-11-09 DIAGNOSIS — I69351 Hemiplegia and hemiparesis following cerebral infarction affecting right dominant side: Secondary | ICD-10-CM | POA: Diagnosis not present

## 2020-11-09 DIAGNOSIS — R1312 Dysphagia, oropharyngeal phase: Secondary | ICD-10-CM | POA: Diagnosis not present

## 2020-11-13 ENCOUNTER — Encounter: Payer: Self-pay | Admitting: Adult Health

## 2020-11-13 ENCOUNTER — Non-Acute Institutional Stay (SKILLED_NURSING_FACILITY): Payer: Medicare Other | Admitting: Adult Health

## 2020-11-13 DIAGNOSIS — K5909 Other constipation: Secondary | ICD-10-CM

## 2020-11-13 DIAGNOSIS — E1169 Type 2 diabetes mellitus with other specified complication: Secondary | ICD-10-CM | POA: Diagnosis not present

## 2020-11-13 DIAGNOSIS — E785 Hyperlipidemia, unspecified: Secondary | ICD-10-CM | POA: Diagnosis not present

## 2020-11-13 DIAGNOSIS — I69391 Dysphagia following cerebral infarction: Secondary | ICD-10-CM

## 2020-11-13 NOTE — Progress Notes (Signed)
Location:  [facility] Room Number: 114/W Place of Service:  SNF (31)   CODE STATUS: DNR  Allergies  Allergen Reactions  . Contrast Media [Iodinated Diagnostic Agents] Anaphylaxis  . Betamethasone Valerate Other (See Comments)    Chief Complaint  Patient presents with  . Medical Management of Chronic Issues          Dysphagia due to CVA:    Dyslipidemia associated with type 2 diabetes mellitus Chronic constipation     HPI:  She is a 87 year old long term resident of this facility being seen for the management of her chronic illnesses:Dysphagia due to CVA:    Dyslipidemia associated with type 2 diabetes mellitus Chronic constipation. There are no reports of insomnia. She will decline medications and will not take a higher dose of buspar. She remains in bed per her choice.    Past Medical History:  Diagnosis Date  . Arthritis   . Coronary artery disease    angina  . Diabetes mellitus without complication (HCC)   . Hypertension     Past Surgical History:  Procedure Laterality Date  . APPENDECTOMY    . CHOLECYSTECTOMY      Social History   Socioeconomic History  . Marital status: Widowed    Spouse name: Not on file  . Number of children: Not on file  . Years of education: Not on file  . Highest education level: Not on file  Occupational History  . Not on file  Tobacco Use  . Smoking status: Never Smoker  . Smokeless tobacco: Never Used  Vaping Use  . Vaping Use: Never used  Substance and Sexual Activity  . Alcohol use: No  . Drug use: No  . Sexual activity: Not on file  Other Topics Concern  . Not on file  Social History Narrative  . Not on file   Social Determinants of Health   Financial Resource Strain: Not on file  Food Insecurity: Not on file  Transportation Needs: Not on file  Physical Activity: Not on file  Stress: Not on file  Social Connections: Not on file  Intimate Partner Violence: Not on file   History reviewed.  No pertinent family history.    VITAL SIGNS BP (!) 129/54   Pulse (!) 54   Temp 97.8 F (36.6 C)   Ht 5\' 4"  (1.626 m)   Wt 167 lb (75.8 kg)   BMI 28.67 kg/m   Outpatient Encounter Medications as of 11/13/2020  Medication Sig  . acetaminophen (TYLENOL) 325 MG tablet Take 650 mg by mouth every 6 (six) hours as needed.   11/15/2020 amLODipine (NORVASC) 10 MG tablet Take 1 tablet (10 mg total) by mouth daily.  [name] atorvastatin (LIPITOR) 20 MG tablet Take 1 tablet (20 mg total) by mouth daily at 6 PM.  . Balsam [place]-Castor Oil (VENELEX) OINT Apply topically. Apply to sacrum and bilateral buttocks qshift for prevention.  . busPIRone (BUSPAR) 7.5 MG tablet Take 7.5 mg by mouth 2 (two) times daily. For Anxiety  . hydrocortisone cream 0.5 % Apply 1 application topically 2 (two) times daily. Special Instructions: BID prn rash to left foot and left knee till healed  . lisinopril (ZESTRIL) 10 MG tablet Take 10 mg by mouth daily.  [name] LORazepam (ATIVAN) 0.5 MG tablet Take 0.5 tablets (0.25 mg total) by mouth at bedtime.  . melatonin 3 MG TABS tablet Take 3 mg by mouth at bedtime.  . metFORMIN (GLUCOPHAGE) 500 MG tablet Take  1 tablet by mouth 2 (two) times daily.  . Methyl Salicylate 10 % PTCH Apply 1 patch topically daily in the afternoon.  . NON FORMULARY Diet Change: Regular, carbohydrate consistent with thin liquids  . NON FORMULARY Accu-check qam. Notify provider of results under 60 or over 400. Once A Day  . Soap & Cleansers (CETAPHIL GENTLE CLEANSER EX) Apply 1 application topically daily. Wash skin with cleanser   No facility-administered encounter medications on file as of 11/13/2020.     SIGNIFICANT DIAGNOSTIC EXAMS   PREVIOUS   07-26-18: 2-d echo:  Left Ventricle: Left ventricular ejection fraction, by visual estimation, is 65 to 70%. The left ventricle has normal function. The left ventricle is not well visualized. There is moderately increased left ventricular hypertrophy. Left ventricular   diastolic parameters are consistent with Grade I diastolic dysfunction (impaired relaxation). Elevated left ventricular end-diastolic pressure.   Right Ventricle: The right ventricular size is normal. No increase in right ventricular wall thickness. Global RV systolic function is has normal systolic function. Left Atrium: Left atrial size was normal in size. Right Atrium: Right atrial size was normal in size Pericardium: There is no evidence of pericardial effusion. Mitral Valve: The mitral valve is normal in structure. No evidence of mitral valve regurgitation. No evidence of mitral valve stenosis by observation. MV peak gradient, 7.4 mmHg. Tricuspid Valve: The tricuspid valve is normal in structure. Tricuspid valve regurgitation is trivial.   Aortic Valve: The aortic valve was not well visualized. Aortic valve regurgitation is not visualized. Mild aortic valve sclerosis is present, with no evidence of aortic valve stenosis. Aortic valve mean gradient measures 6.0 mmHg. Aortic valve peak  gradient measures 11.0 mmHg. Aortic valve area, by VTI measures 2.05 cm.  07-31-19: MRI/MRA of head:  Parenchymal hemorrhage involving the left thalamus and adjacent white matter with intraventricular extension. Associated mass effect is similar to recent CT. No hydrocephalus. Moderate chronic microvascular ischemic changes. Mild irregularity and stenosis of left P1 PCA. Otherwise unremarkable MRA of the head.  02-03-20: DEXA t score -1.009  03-14-20: ct of head and cervical spine:  1. No evidence of acute intracranial process. 2. No acute cervical spine fracture or listhesis. 3. Small soft tissue laceration at the right parietal scalp at the vertex with associated small subcutaneous hematoma and small right parietal scalp hematoma. 4. Moderate to severe multilevel degenerative disc disease of the cervical spine, most severe at C4-5 where there is a large herniated disc material. The AP diameter of the spinal  canal in this region is approximately 5-6 mm. Multilevel mild neural foraminal narrowing, most severe on the left at C6-7 where there is also uncovertebral and facet arthrosis. 5. Moderate periventricular white matter changes likely reflecting the sequela of small vessel ischemia. Previously noted intraparenchymal hematoma within the left basal ganglia and intraventricular hemorrhage has resolved. There is residual encephalomalacia within the posterior limb of the left internal capsule and left thalamus.   NO NEW EXAMS.   LABS REVIEWED PREVIOUS   SHE IS DECLINING BLOOD DRAWS   12-16-19: urine micro-albumin 8.3 ( on ACE)  hgb a1c 6.9 12-23-19: chol 94; ldl 33 trig 93 hdl 42 vit B 12: 371 02-03-20: wbc 7.9; hgb 13.5; hct 42.3; mcv 93.8 plt 223; glucose  129; bun 11; creat 0.44; k+ 4.1; na++ 133 ca 9.0 liver normal albumin 3.1  05-04-20: hgb a1c 6.4   NO NEW LABS   Review of Systems  Constitutional: Negative for malaise/fatigue.  Respiratory: Negative for cough and  shortness of breath.   Cardiovascular: Negative for chest pain, palpitations and leg swelling.  Gastrointestinal: Negative for abdominal pain, constipation and heartburn.  Musculoskeletal: Positive for joint pain. Negative for back pain and myalgias.       Right foot pain   Skin: Negative.   Neurological: Negative for dizziness.  Psychiatric/Behavioral: The patient is not nervous/anxious.       Physical Exam Constitutional:      General: She is not in acute distress.    Appearance: She is well-developed. She is not diaphoretic.  Neck:     Thyroid: No thyromegaly.  Cardiovascular:     Rate and Rhythm: Normal rate and regular rhythm.     Pulses: Normal pulses.     Heart sounds: Normal heart sounds.  Pulmonary:     Effort: Pulmonary effort is normal. No respiratory distress.     Breath sounds: Normal breath sounds.  Abdominal:     General: Bowel sounds are normal. There is no distension.     Palpations: Abdomen is soft.      Tenderness: There is no abdominal tenderness.  Musculoskeletal:     Cervical back: Neck supple.     Right lower leg: No edema.     Left lower leg: No edema.     Comments: Right hemiplegia Right upper extremity contracture   Lymphadenopathy:     Cervical: No cervical adenopathy.  Skin:    General: Skin is warm and dry.  Neurological:     Mental Status: She is alert. Mental status is at baseline.  Psychiatric:        Mood and Affect: Mood normal.      ASSESSMENT/ PLAN:  TODAY  1. Dysphagia due to CVA: is stable no signs of aspiration present; will continue thin liquids  2. Dyslipidemia associated with type 2 diabetes mellitus is stable LDL 22 will continue lipitor 20 mg daily  3. Chronic constipation: is stable will continue senna s twice daily    PREVIOUS  4. Hypertension associated with type 2 diabetes mellitus: is stable b/p 119/54 will continue lisinopril 10 mg daily norvasc 10 mg daily   5. Type 2 diabetes mellitus with peripheral artery disease: is stable hgb a1c 6.4 will continue metformin 500 mg twice daily is on ace statin no asa   6. Moderate episode of current major depressive disorder: is stable is off zoloft per her choice; will continue ativan 0.25 mg nightly (has failed one wean); buspar 7.5 mg twice daily will at times decline medications; will not take higher doses of buspar.   7. 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

## 2020-11-17 ENCOUNTER — Other Ambulatory Visit: Payer: Self-pay | Admitting: Adult Health

## 2020-11-17 MED ORDER — LORAZEPAM 0.5 MG PO TABS: 0.2500 mg | ORAL_TABLET | Freq: Every day | ORAL | 0 refills | Status: DC

## 2020-12-01 ENCOUNTER — Encounter: Payer: Self-pay | Admitting: Adult Health

## 2020-12-01 ENCOUNTER — Non-Acute Institutional Stay (SKILLED_NURSING_FACILITY): Payer: Medicare Other | Admitting: Adult Health

## 2020-12-01 DIAGNOSIS — F323 Major depressive disorder, single episode, severe with psychotic features: Secondary | ICD-10-CM

## 2020-12-01 DIAGNOSIS — E1169 Type 2 diabetes mellitus with other specified complication: Secondary | ICD-10-CM

## 2020-12-01 DIAGNOSIS — E1151 Type 2 diabetes mellitus with diabetic peripheral angiopathy without gangrene: Secondary | ICD-10-CM

## 2020-12-01 DIAGNOSIS — E785 Hyperlipidemia, unspecified: Secondary | ICD-10-CM | POA: Diagnosis not present

## 2020-12-01 NOTE — Progress Notes (Signed)
Location:  [facility] Room Number: 114-W Place of Service:  SNF (31)   CODE STATUS: DNR  Allergies  Allergen Reactions  . Contrast Media [Iodinated Diagnostic Agents] Anaphylaxis  . Betamethasone Valerate Other (See Comments)    Chief Complaint  Patient presents with  . Medical Management of Chronic Issues            Hypertension associated with type 2 diabetes mellitus:    Type 2 diabetes mellitus with peripheral artery disease:   [name] Major depression with psychotic features     HPI:  She is a 87 year old long term resident of this facility being seen for the management of her chronic illnesses:  Hypertension associated with type 2 diabetes mellitus:    Type 2 diabetes mellitus with peripheral artery disease:   [name] Major depression with psychotic features. She continues to decline labs; remains in bed per her choice. The salon pas patches are effective in pain management.   Past Medical History:  Diagnosis Date  . Arthritis   . Coronary artery disease    angina  . Diabetes mellitus without complication (HCC)   . Hypertension     Past Surgical History:  Procedure Laterality Date  . APPENDECTOMY    . CHOLECYSTECTOMY      Social History   Socioeconomic History  . Marital status: Widowed    Spouse name: Not on file  . Number of children: Not on file  . Years of education: Not on file  . Highest education level: Not on file  Occupational History  . Not on file  Tobacco Use  . Smoking status: Never Smoker  . Smokeless tobacco: Never Used  Vaping Use  . Vaping Use: Never used  Substance and Sexual Activity  . Alcohol use: No  . Drug use: No  . Sexual activity: Not on file  Other Topics Concern  . Not on file  Social History Narrative  . Not on file   Social Determinants of Health   Financial Resource Strain: Not on file  Food Insecurity: Not on file  Transportation Needs: Not on file  Physical Activity: Not on file  Stress: Not on file   Social Connections: Not on file  Intimate Partner Violence: Not on file   History reviewed. No pertinent family history.    VITAL SIGNS BP 128/69   Pulse (!) 58   Temp 98.3 F (36.8 C)   Resp 20   Ht 5\' 4"  (1.626 m)   Wt 164 lb 12.8 oz (74.8 kg)   SpO2 97%   BMI 28.29 kg/m   Outpatient Encounter Medications as of 12/01/2020  Medication Sig  . acetaminophen (TYLENOL) 325 MG tablet Take 650 mg by mouth every 6 (six) hours as needed.   12/03/2020 amLODipine (NORVASC) 10 MG tablet Take 1 tablet (10 mg total) by mouth daily.  [name] atorvastatin (LIPITOR) 20 MG tablet Take 1 tablet (20 mg total) by mouth daily at 6 PM.  . Balsam [place]-Castor Oil (VENELEX) OINT Apply topically. Apply to sacrum and bilateral buttocks qshift for prevention.  . busPIRone (BUSPAR) 7.5 MG tablet Take 7.5 mg by mouth 2 (two) times daily. For Anxiety  . hydrocortisone cream 0.5 % Apply 1 application topically 2 (two) times daily. Special Instructions: BID prn rash to left foot and left knee till healed  . lisinopril (ZESTRIL) 10 MG tablet Take 10 mg by mouth daily.  [name] LORazepam (ATIVAN) 0.5 MG tablet Take 0.5 tablets (0.25 mg total)  by mouth at bedtime.  . melatonin 3 MG TABS tablet Take 3 mg by mouth at bedtime.  . metFORMIN (GLUCOPHAGE) 500 MG tablet Take 1 tablet by mouth 2 (two) times daily.  . Methyl Salicylate 10 % PTCH Apply 1 patch topically daily in the afternoon.  . NON FORMULARY Diet Change: Regular, carbohydrate consistent with thin liquids  . NON FORMULARY Accu-check qam. Notify provider of results under 60 or over 400. Once A Day  . Soap & Cleansers (CETAPHIL GENTLE CLEANSER EX) Apply 1 application topically daily. Wash skin with cleanser   No facility-administered encounter medications on file as of 12/01/2020.     SIGNIFICANT DIAGNOSTIC EXAMS   PREVIOUS   07-26-18: 2-d echo:  Left Ventricle: Left ventricular ejection fraction, by visual estimation, is 65 to 70%. The left ventricle has normal  function. The left ventricle is not well visualized. There is moderately increased left ventricular hypertrophy. Left ventricular  diastolic parameters are consistent with Grade I diastolic dysfunction (impaired relaxation). Elevated left ventricular end-diastolic pressure.   Right Ventricle: The right ventricular size is normal. No increase in right ventricular wall thickness. Global RV systolic function is has normal systolic function. Left Atrium: Left atrial size was normal in size. Right Atrium: Right atrial size was normal in size Pericardium: There is no evidence of pericardial effusion. Mitral Valve: The mitral valve is normal in structure. No evidence of mitral valve regurgitation. No evidence of mitral valve stenosis by observation. MV peak gradient, 7.4 mmHg. Tricuspid Valve: The tricuspid valve is normal in structure. Tricuspid valve regurgitation is trivial.   Aortic Valve: The aortic valve was not well visualized. Aortic valve regurgitation is not visualized. Mild aortic valve sclerosis is present, with no evidence of aortic valve stenosis. Aortic valve mean gradient measures 6.0 mmHg. Aortic valve peak  gradient measures 11.0 mmHg. Aortic valve area, by VTI measures 2.05 cm.  07-31-19: MRI/MRA of head:  Parenchymal hemorrhage involving the left thalamus and adjacent white matter with intraventricular extension. Associated mass effect is similar to recent CT. No hydrocephalus. Moderate chronic microvascular ischemic changes. Mild irregularity and stenosis of left P1 PCA. Otherwise unremarkable MRA of the head.  02-03-20: DEXA t score -1.009  03-14-20: ct of head and cervical spine:  1. No evidence of acute intracranial process. 2. No acute cervical spine fracture or listhesis. 3. Small soft tissue laceration at the right parietal scalp at the vertex with associated small subcutaneous hematoma and small right parietal scalp hematoma. 4. Moderate to severe multilevel degenerative disc  disease of the cervical spine, most severe at C4-5 where there is a large herniated disc material. The AP diameter of the spinal canal in this region is approximately 5-6 mm. Multilevel mild neural foraminal narrowing, most severe on the left at C6-7 where there is also uncovertebral and facet arthrosis. 5. Moderate periventricular white matter changes likely reflecting the sequela of small vessel ischemia. Previously noted intraparenchymal hematoma within the left basal ganglia and intraventricular hemorrhage has resolved. There is residual encephalomalacia within the posterior limb of the left internal capsule and left thalamus.   NO NEW EXAMS.   LABS REVIEWED PREVIOUS   SHE IS DECLINING BLOOD DRAWS   12-16-19: urine micro-albumin 8.3 ( on ACE)  hgb a1c 6.9 12-23-19: chol 94; ldl 33 trig 93 hdl 42 vit B 12: 371 02-03-20: wbc 7.9; hgb 13.5; hct 42.3; mcv 93.8 plt 223; glucose  129; bun 11; creat 0.44; k+ 4.1; na++ 133 ca 9.0 liver normal albumin 3.1  05-04-20: hgb a1c 6.4   NO NEW LABS   Review of Systems  Constitutional: Negative for malaise/fatigue.  Respiratory: Negative for cough and shortness of breath.   Cardiovascular: Negative for chest pain, palpitations and leg swelling.  Gastrointestinal: Negative for abdominal pain, constipation and heartburn.  Musculoskeletal: Negative for back pain, joint pain and myalgias.  Skin: Negative.   Neurological: Negative for dizziness.  Psychiatric/Behavioral: The patient is not nervous/anxious.       Physical Exam Constitutional:      General: She is not in acute distress.    Appearance: She is well-developed. She is not diaphoretic.  Neck:     Thyroid: No thyromegaly.  Cardiovascular:     Rate and Rhythm: Normal rate and regular rhythm.     Pulses: Normal pulses.     Heart sounds: Normal heart sounds.  Pulmonary:     Effort: Pulmonary effort is normal. No respiratory distress.     Breath sounds: Normal breath sounds.  Abdominal:      General: Bowel sounds are normal. There is no distension.     Palpations: Abdomen is soft.     Tenderness: There is no abdominal tenderness.  Musculoskeletal:     Cervical back: Neck supple.     Right lower leg: No edema.     Left lower leg: No edema.     Comments: Right hemiplegia with contractures.   Lymphadenopathy:     Cervical: No cervical adenopathy.  Skin:    General: Skin is warm and dry.  Neurological:     Mental Status: She is alert. Mental status is at baseline.  Psychiatric:        Mood and Affect: Mood normal.      ASSESSMENT/ PLAN:  TODAY  1. Hypertension associated with type 2 diabetes mellitus: is stable b/p 128/69 will continue lisinopril 10 mg daily norvasc 10 mg daily   2. Type 2 diabetes mellitus with peripheral artery disease: is stable hgb a1c 6.4 will continue metformin 500 mg twice daily is on ac statin no asa   3. Major depression with psychotic features. : is stable is off zoloft per her request; will continue ativan 0.25 mg nightly (has failed one wean) buspar 7.5 mg twice daily has declined all changes to this medication.    PREVIOUS  4. IVH (intraventricular hemorrhage)  Hemiplegia of right dominant side as late effect of cerebral infarction unspecified hemiplegia type: is stable   5. Dysphagia due to CVA: is stable no signs of aspiration present; will continue thin liquids  6. Dyslipidemia associated with type 2 diabetes mellitus is stable LDL 22 will continue lipitor 20 mg daily  7. Chronic constipation: is stable will continue senna s twice daily      Synthia Innocent NP [facility]  Contact [phone] Monday through Friday 8am- 5pm  After hours call [phone]

## 2020-12-11 ENCOUNTER — Other Ambulatory Visit: Payer: Self-pay | Admitting: Adult Health

## 2020-12-11 MED ORDER — LORAZEPAM 0.5 MG PO TABS: 0.2500 mg | ORAL_TABLET | Freq: Every day | ORAL | 0 refills | Status: DC

## 2020-12-25 ENCOUNTER — Encounter: Payer: Self-pay | Admitting: Adult Health

## 2020-12-25 ENCOUNTER — Non-Acute Institutional Stay (SKILLED_NURSING_FACILITY): Payer: Medicare Other | Admitting: Adult Health

## 2020-12-25 DIAGNOSIS — E785 Hyperlipidemia, unspecified: Secondary | ICD-10-CM

## 2020-12-25 DIAGNOSIS — I69391 Dysphagia following cerebral infarction: Secondary | ICD-10-CM | POA: Diagnosis not present

## 2020-12-25 DIAGNOSIS — I69351 Hemiplegia and hemiparesis following cerebral infarction affecting right dominant side: Secondary | ICD-10-CM | POA: Diagnosis not present

## 2020-12-25 DIAGNOSIS — I615 Nontraumatic intracerebral hemorrhage, intraventricular: Secondary | ICD-10-CM | POA: Diagnosis not present

## 2020-12-25 DIAGNOSIS — E1169 Type 2 diabetes mellitus with other specified complication: Secondary | ICD-10-CM

## 2020-12-25 NOTE — Progress Notes (Signed)
Location:  [facility] Room Number: 114-W Place of Service:  SNF (31)   CODE STATUS: DNR  Allergies  Allergen Reactions  . Contrast Media [Iodinated Diagnostic Agents] Anaphylaxis  . Betamethasone Valerate Other (See Comments)    Chief Complaint  Patient presents with  . 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 Dysphagia due to CVA:   Dyslipidemia associated with type 2 diabetes mellitus    HPI:  She is a 87 year old long term resident of this facility being seen for the management of her chronic illnesses: VH (intraventricular hemorrhage) hemiplegia of right dominant side as late effect of cerebral infarction unspecified hemiplegia type. Dysphagia due to CVA:   Dyslipidemia associated with type 2 diabetes mellitus. She continues to decline lab draws; no reports of agitation; she will get out of bed most days.   Past Medical History:  Diagnosis Date  . Arthritis   . Coronary artery disease    angina  . Diabetes mellitus without complication (HCC)   . Hypertension     Past Surgical History:  Procedure Laterality Date  . APPENDECTOMY    . CHOLECYSTECTOMY      Social History   Socioeconomic History  . Marital status: Widowed    Spouse name: Not on file  . Number of children: Not on file  . Years of education: Not on file  . Highest education level: Not on file  Occupational History  . Not on file  Tobacco Use  . Smoking status: Never Smoker  . Smokeless tobacco: Never Used  Vaping Use  . Vaping Use: Never used  Substance and Sexual Activity  . Alcohol use: No  . Drug use: No  . Sexual activity: Not on file  Other Topics Concern  . Not on file  Social History Narrative  . Not on file   Social Determinants of Health   Financial Resource Strain: Not on file  Food Insecurity: Not on file   Transportation Needs: Not on file  Physical Activity: Not on file  Stress: Not on file  Social Connections: Not on file  Intimate Partner Violence: Not on file   No family history on file.    VITAL SIGNS BP (!) 131/54   Pulse 71   Temp (!) 97.5 F (36.4 C)   Resp 20   Ht 5\' 4"  (1.626 m)   Wt 164 lb (74.4 kg)   SpO2 97%   BMI 28.15 kg/m   Outpatient Encounter Medications as of 12/25/2020  Medication Sig  . acetaminophen (TYLENOL) 325 MG tablet Take 650 mg by mouth every 6 (six) hours as needed.   02/24/2021 amLODipine (NORVASC) 10 MG tablet Take 1 tablet (10 mg total) by mouth daily.  [name] atorvastatin (LIPITOR) 20 MG tablet Take 1 tablet (20 mg total) by mouth daily at 6 PM.  . Balsam [place]-Castor Oil (VENELEX) OINT Apply topically. Apply to sacrum and bilateral buttocks qshift for prevention.  . busPIRone (BUSPAR) 7.5 MG tablet Take 7.5 mg by mouth 2 (two) times daily. For Anxiety  . Camphor-Menthol-Methyl Sal (SALONPAS) 3.07-27-08 % PTCH Apply topically. 1 patch, topical, Twice A Day, apply on Right foot,right forearm,and Left Shoulder and neck in the AM and remove in the PM for pain management  . hydrocortisone cream 0.5 % Apply 1 application topically 2 (two) times daily. Special Instructions:  BID prn rash to left foot and left knee till healed  . lisinopril (ZESTRIL) 10 MG tablet Take 10 mg by mouth daily.  [name] LORazepam (ATIVAN) 0.5 MG tablet Take 0.5 tablets (0.25 mg total) by mouth at bedtime.  . melatonin 3 MG TABS tablet Take 3 mg by mouth at bedtime.  . metFORMIN (GLUCOPHAGE) 500 MG tablet Take 1 tablet by mouth 2 (two) times daily.  . NON FORMULARY Diet Change: Regular, carbohydrate consistent with thin liquids  . NON FORMULARY Accu-check qam. Notify provider of results under 60 or over 400. Once A Day  . Soap & Cleansers (CETAPHIL GENTLE CLEANSER EX) Apply 1 application topically daily. Wash skin with cleanser  . [DISCONTINUED] Methyl Salicylate 10 % PTCH Apply 1 patch topically  daily in the afternoon.   No facility-administered encounter medications on file as of 12/25/2020.     SIGNIFICANT DIAGNOSTIC EXAMS  PREVIOUS   07-26-18: 2-d echo:  Left Ventricle: Left ventricular ejection fraction, by visual estimation, is 65 to 70%. The left ventricle has normal function. The left ventricle is not well visualized. There is moderately increased left ventricular hypertrophy. Left ventricular  diastolic parameters are consistent with Grade I diastolic dysfunction (impaired relaxation). Elevated left ventricular end-diastolic pressure.   Right Ventricle: The right ventricular size is normal. No increase in right ventricular wall thickness. Global RV systolic function is has normal systolic function. Left Atrium: Left atrial size was normal in size. Right Atrium: Right atrial size was normal in size Pericardium: There is no evidence of pericardial effusion. Mitral Valve: The mitral valve is normal in structure. No evidence of mitral valve regurgitation. No evidence of mitral valve stenosis by observation. MV peak gradient, 7.4 mmHg. Tricuspid Valve: The tricuspid valve is normal in structure. Tricuspid valve regurgitation is trivial.   Aortic Valve: The aortic valve was not well visualized. Aortic valve regurgitation is not visualized. Mild aortic valve sclerosis is present, with no evidence of aortic valve stenosis. Aortic valve mean gradient measures 6.0 mmHg. Aortic valve peak  gradient measures 11.0 mmHg. Aortic valve area, by VTI measures 2.05 cm.  07-31-19: MRI/MRA of head:  Parenchymal hemorrhage involving the left thalamus and adjacent white matter with intraventricular extension. Associated mass effect is similar to recent CT. No hydrocephalus. Moderate chronic microvascular ischemic changes. Mild irregularity and stenosis of left P1 PCA. Otherwise unremarkable MRA of the head.  02-03-20: DEXA t score -1.009  03-14-20: ct of head and cervical spine:  1. No evidence of  acute intracranial process. 2. No acute cervical spine fracture or listhesis. 3. Small soft tissue laceration at the right parietal scalp at the vertex with associated small subcutaneous hematoma and small right parietal scalp hematoma. 4. Moderate to severe multilevel degenerative disc disease of the cervical spine, most severe at C4-5 where there is a large herniated disc material. The AP diameter of the spinal canal in this region is approximately 5-6 mm. Multilevel mild neural foraminal narrowing, most severe on the left at C6-7 where there is also uncovertebral and facet arthrosis. 5. Moderate periventricular white matter changes likely reflecting the sequela of small vessel ischemia. Previously noted intraparenchymal hematoma within the left basal ganglia and intraventricular hemorrhage has resolved. There is residual encephalomalacia within the posterior limb of the left internal capsule and left thalamus.   NO NEW EXAMS.   LABS REVIEWED PREVIOUS   SHE IS DECLINING BLOOD DRAWS   12-16-19: urine micro-albumin 8.3 ( on ACE)  hgb a1c 6.9 12-23-19: chol 94; ldl  33 trig 93 hdl 42 vit B 12: 371 02-03-20: wbc 7.9; hgb 13.5; hct 42.3; mcv 93.8 plt 223; glucose  129; bun 11; creat 0.44; k+ 4.1; na++ 133 ca 9.0 liver normal albumin 3.1  05-04-20: hgb a1c 6.4   NO NEW LABS   Review of Systems  Constitutional:  Negative for malaise/fatigue.  Respiratory:  Negative for cough and shortness of breath.   Cardiovascular:  Negative for chest pain, palpitations and leg swelling.  Gastrointestinal:  Negative for abdominal pain, constipation and heartburn.  Musculoskeletal:  Negative for back pain, joint pain and myalgias.  Skin: Negative.   Neurological:  Negative for dizziness.  Psychiatric/Behavioral:  The patient is not nervous/anxious.    Physical Exam Constitutional:      General: She is not in acute distress.    Appearance: She is well-developed. She is not diaphoretic.  Neck:     Thyroid: No  thyromegaly.  Cardiovascular:     Rate and Rhythm: Normal rate and regular rhythm.     Pulses: Normal pulses.     Heart sounds: Normal heart sounds.  Pulmonary:     Effort: Pulmonary effort is normal. No respiratory distress.     Breath sounds: Normal breath sounds.  Abdominal:     General: Bowel sounds are normal. There is no distension.     Palpations: Abdomen is soft.     Tenderness: There is no abdominal tenderness.  Musculoskeletal:     Cervical back: Neck supple.     Right lower leg: No edema.     Left lower leg: No edema.     Comments:  Right hemiplegia with contractures.   Lymphadenopathy:     Cervical: No cervical adenopathy.  Skin:    General: Skin is warm and dry.  Neurological:     Mental Status: She is alert. Mental status is at baseline.  Psychiatric:        Mood and Affect: Mood normal.     ASSESSMENT/ PLAN:  TODAY  IVH (intraventricular hemorrhage) hemiplegia of right dominant side as late effect of cerebral infarction unspecified hemiplegia type; is stable  2. Dysphagia due to CVA: is stable no signs of aspiration present; will continue thin liquids  3. Dyslipidemia associated with type 2 diabetes mellitus: is stable LDL 22 will continue lipitor 20 mg daily   PREVIOUS  4. Chronic constipation: is stable will continue senna s twice daily   5. Hypertension associated with type 2 diabetes mellitus: is stable b/p 131/54 will continue lisinopril 10 mg daily norvasc 10 mg daily   6. Type 2 diabetes mellitus with peripheral artery disease: is stable hgb a1c 6.4 will continue metformin 500 mg twice daily is on ac statin no asa   7. Major depression with psychotic features. : is stable is off zoloft per her request; will continue ativan 0.25 mg nightly (has failed one wean) buspar 7.5 mg twice daily has declined all changes to this medication.     Synthia Innocent NP [facility]  Contact [phone] Monday through Friday 8am- 5pm  After hours  call [phone]

## 2021-01-11 ENCOUNTER — Other Ambulatory Visit: Payer: Self-pay | Admitting: Internal Medicine

## 2021-01-11 ENCOUNTER — Non-Acute Institutional Stay (SKILLED_NURSING_FACILITY): Payer: Medicare Other | Admitting: Internal Medicine

## 2021-01-11 DIAGNOSIS — F331 Major depressive disorder, recurrent, moderate: Secondary | ICD-10-CM | POA: Diagnosis not present

## 2021-01-11 DIAGNOSIS — U071 COVID-19: Secondary | ICD-10-CM | POA: Diagnosis not present

## 2021-01-11 NOTE — Progress Notes (Unsigned)
   NURSING HOME LOCATION:   [facility] ROOM NUMBER:  114 W  CODE STATUS:  DNR  PCP:  Wyatt Portela NP  This is a nursing facility follow up visit for specific acute issue of possible COVID 19 infection. She tested positive yesterday. She had refused COVID -19 vaccines to date.  Interim medical record and care since last SNF visit was updated with review of diagnostic studies and change in clinical status since last visit were documented.  HPI: Apparently grandson alerted staff his grandmother may have COVID.  Apparently she is unvaccinated.  She has chronic cough.  Chest x-ray was performed today which revealed some linear atelectasis bilaterally.  There is no infiltrate, effusion, or acute findings present. Labs are not current as she has continued to decline lab draws.  Review of systems: Dementia invalidated responses. Date given as   Constitutional: No fever, significant weight change, fatigue  Eyes: No redness, discharge, pain, vision change ENT/mouth: No nasal congestion,  purulent discharge, earache, change in hearing, sore throat  Cardiovascular: No chest pain, palpitations, paroxysmal nocturnal dyspnea, claudication, edema  Respiratory: No cough, sputum production, hemoptysis, DOE, significant snoring, apnea   Gastrointestinal: No heartburn, dysphagia, abdominal pain, nausea /vomiting, rectal bleeding, melena, change in bowels Genitourinary: No dysuria, hematuria, pyuria, incontinence, nocturia Musculoskeletal: No joint stiffness, joint swelling, weakness, pain Dermatologic: No rash, pruritus, change in appearance of skin Neurologic: No dizziness, headache, syncope, seizures, numbness, tingling Psychiatric: No significant anxiety, depression, insomnia, anorexia Endocrine: No change in hair/skin/nails, excessive thirst, excessive hunger, excessive urination  Hematologic/lymphatic: No significant bruising, lymphadenopathy, abnormal bleeding Allergy/immunology:  No itchy/watery eyes, significant sneezing, urticaria, angioedema  Physical exam: Temperature 97.9, pulse 51, respiratory rate 18, blood pressure 126/61, and O2 sats 94%. Pertinent or positive findings: General appearance: Adequately nourished; no acute distress, increased work of breathing is present.   Lymphatic: No lymphadenopathy about the head, neck, axilla. Eyes: No conjunctival inflammation or lid edema is present. There is no scleral icterus. Ears:  External ear exam shows no significant lesions or deformities.   Nose:  External nasal examination shows no deformity or inflammation. Nasal mucosa are pink and moist without lesions, exudates Oral exam:  Lips and gums are healthy appearing. There is no oropharyngeal erythema or exudate. Neck:  No thyromegaly, masses, tenderness noted.    Heart:  Normal rate and regular rhythm. S1 and S2 normal without gallop, murmur, click, rub .  Lungs: Chest clear to auscultation without wheezes, rhonchi, rales, rubs. Abdomen: Bowel sounds are normal. Abdomen is soft and nontender with no organomegaly, hernias, masses. GU: Deferred  Extremities:  No cyanosis, clubbing, edema  Neurologic exam : Cn 2-7 intact Strength equal  in upper & lower extremities Balance, Rhomberg, finger to nose testing could not be completed due to clinical state Deep tendon reflexes are equal Skin: Warm & dry w/o tenting. No significant lesions or rash.  See summary under each active problem in the Problem List with associated updated therapeutic plan

## 2021-01-11 NOTE — Assessment & Plan Note (Addendum)
Steroids will be prescribed along with doxycycline. I explained to her that we need labs (C-reactive protein and D-dimer) to assess the risk related to COVID infection.  To date she has been refusing blood draws.

## 2021-01-11 NOTE — Progress Notes (Signed)
NURSING HOME LOCATION:  [facility] ROOM NUMBER:  114 W  CODE STATUS:  DNR  PCP: Synthia Innocent, NP  This is a nursing facility follow up visit for specific acute issue of positive COVID-19 screening test.  Interim medical record and care since last SNF visit was updated with review of diagnostic studies and change in clinical status since last visit were documented.  HPI: She had refused the COVID-vaccine but tested positive yesterday.Staff reports she also refuses blood draws. She has a chronic cough which she verifies is no worse.  Her grandson feels that she has had cold symptoms for approximately a week. He questions sinus infection & requests a Zpack. Chest x-ray performed today revealed no infiltrate, effusion, or acute findings.  She exhibits no hypoxemia and vital signs are stable.  Review of systems: She describes a cough which is essentially stable.  She does not visualize the phlegm; she spits it in the trash can.  She denies any symptoms to suggest rhinosinusitis.  She states that she does note that her breath is "getting short".  She states that she is having trouble mentating, stating "cannot think straight"..  She may have decreased taste.  Constitutional: No fever, significant weight change Eyes: No redness, discharge, pain, vision change ENT/mouth: No nasal congestion,  purulent discharge, earache, change in hearing, sore throat  Cardiovascular: No chest pain, palpitations, paroxysmal nocturnal dyspnea, claudication, edema  Respiratory: No production, hemoptysis, significant snoring, apnea   Gastrointestinal: No heartburn, dysphagia, abdominal pain, nausea /vomiting, rectal bleeding, melena, change in bowels Genitourinary: No dysuria, hematuria, pyuria, incontinence, nocturia Musculoskeletal: No joint stiffness, joint swelling, weakness, pain Dermatologic: No rash, pruritus, change in appearance of skin Neurologic: No dizziness, headache, syncope,  seizures, numbness, tingling Psychiatric: No significant anxiety, depression, insomnia, anorexia Endocrine: No change in hair/skin/nails, excessive thirst, excessive hunger, excessive urination  Hematologic/lymphatic: No significant bruising, lymphadenopathy, abnormal bleeding Allergy/immunology: No itchy/watery eyes, significant sneezing, urticaria, angioedema  Physical exam:  Pertinent or positive findings: She appears her stated age.  Pattern alopecia is present especially over the crown.  The right corner of the mouth sags.  She is wearing an upper plate.  There is faint hyperpigmentation over the lateral malar areas.  She has low-grade rales bilaterally without increased work of breathing.  Heart sounds are distant and rate is slow.  Pedal pulses are surprisingly strong.  She has trace edema.  Right hemiparesis is present.  There is clawing of the right hand.  General appearance: Adequately nourished; no acute distress, increased work of breathing is present.   Lymphatic: No lymphadenopathy about the head, neck, axilla. Eyes: No conjunctival inflammation or lid edema is present. There is no scleral icterus. Ears:  External ear exam shows no significant lesions or deformities.   Nose:  External nasal examination shows no deformity or inflammation. Nasal mucosa are pink and moist without lesions, exudates Oral exam:  Lips and gums are healthy appearing. There is no oropharyngeal erythema or exudate. Neck:  No thyromegaly, masses, tenderness noted.    Heart:  No gallop, murmur, click, rub .  Lungs: without wheezes, rhonchi, rubs. Abdomen: Bowel sounds are normal. Abdomen is soft and nontender with no organomegaly, hernias, masses. GU: Deferred  Extremities:  No cyanosis, clubbing Neurologic exam : Balance, Rhomberg, finger to nose testing could not be completed due to clinical state Skin: Warm & dry w/o tenting. No significant rash.  See summary under each active problem in the Problem  List with associated updated  therapeutic plan

## 2021-01-13 ENCOUNTER — Encounter: Payer: Self-pay | Admitting: Internal Medicine

## 2021-01-13 NOTE — Patient Instructions (Signed)
See assessment and plan under diagnosis in the problem list  acutely for this visit 

## 2021-01-17 ENCOUNTER — Other Ambulatory Visit: Payer: Self-pay | Admitting: Adult Health

## 2021-01-17 MED ORDER — LORAZEPAM 0.5 MG PO TABS: 0.2500 mg | ORAL_TABLET | Freq: Every day | ORAL | 0 refills | Status: DC

## 2021-01-18 ENCOUNTER — Non-Acute Institutional Stay (SKILLED_NURSING_FACILITY): Payer: Medicare Other | Admitting: Adult Health

## 2021-01-18 ENCOUNTER — Encounter: Payer: Self-pay | Admitting: Adult Health

## 2021-01-18 DIAGNOSIS — F01518 Vascular dementia, unspecified severity, with other behavioral disturbance: Secondary | ICD-10-CM

## 2021-01-18 DIAGNOSIS — F0151 Vascular dementia with behavioral disturbance: Secondary | ICD-10-CM

## 2021-01-18 DIAGNOSIS — I69351 Hemiplegia and hemiparesis following cerebral infarction affecting right dominant side: Secondary | ICD-10-CM

## 2021-01-18 DIAGNOSIS — F323 Major depressive disorder, single episode, severe with psychotic features: Secondary | ICD-10-CM

## 2021-01-18 DIAGNOSIS — U071 COVID-19: Secondary | ICD-10-CM | POA: Diagnosis not present

## 2021-01-18 NOTE — Progress Notes (Signed)
Location:  [facility] Room Number: 114-W Place of Service:  SNF (31)   CODE STATUS: DNR  Allergies  Allergen Reactions   Contrast Media [Iodinated Diagnostic Agents] Anaphylaxis   Betamethasone Valerate Other (See Comments)    Chief Complaint  Patient presents with   Acute Visit    Care plan meeting    HPI:  We have come together for her care plan meeting. BIMS 15/15 mood 5/30: not sleeping well; not eating well; nervous frequently. She does decline medications and nursing care at times. She is nonambulatory; she does spend all of her time in bed per her choice. She is dependent with her adls. She does feed herself. She is incontinent of bladder and frequently incontinent of bowel. Her cbg readings are stable. She has declined all blood work. She is not wearing her right wrist splint per her choice. She is delusional at times. Her weight is stable at 164 pounds; has a good appetite. There are no reports of uncontrolled pain. She is not being seen by therapy. She continues to be followed for her chronic illnesses including: Vascular dementia with behavioral disturbance Hemiplegia of right dominant side as late effect of cerebral infarction (CVA) unspecified hemiplegia type SARS CoV-2 positive Major depression with psychotic features   Past Medical History:  Diagnosis Date   Arthritis    Coronary artery disease    angina   Diabetes mellitus without complication (HCC)    Hypertension     Past Surgical History:  Procedure Laterality Date   APPENDECTOMY     CHOLECYSTECTOMY      Social History   Socioeconomic History   Marital status: Widowed    Spouse name: Not on file   Number of children: Not on file   Years of education: Not on file   Highest education level: Not on file  Occupational History   Not on file  Tobacco Use   Smoking status: Never   Smokeless tobacco: Never  Vaping Use   Vaping Use: Never used  Substance and Sexual Activity    Alcohol use: No   Drug use: No   Sexual activity: Not on file  Other Topics Concern   Not on file  Social History Narrative   Not on file   Social Determinants of Health   Financial Resource Strain: Not on file  Food Insecurity: Not on file  Transportation Needs: Not on file  Physical Activity: Not on file  Stress: Not on file  Social Connections: Not on file  Intimate Partner Violence: Not on file   History reviewed. No pertinent family history.    VITAL SIGNS BP (!) 142/68   Pulse 74   Temp 98.2 F (36.8 C)   Resp 18   Ht 5\' 4"  (1.626 m)   Wt 164 lb (74.4 kg)   SpO2 97%   BMI 28.15 kg/m   Outpatient Encounter Medications as of 01/18/2021  Medication Sig   acetaminophen (TYLENOL) 325 MG tablet Take 650 mg by mouth every 6 (six) hours as needed.    amLODipine (NORVASC) 10 MG tablet Take 1 tablet (10 mg total) by mouth daily.   atorvastatin (LIPITOR) 20 MG tablet Take 1 tablet (20 mg total) by mouth daily at 6 PM.   Balsam [place]-Castor Oil (VENELEX) OINT Apply topically. Apply to sacrum and bilateral buttocks qshift for prevention.   busPIRone (BUSPAR) 7.5 MG tablet Take 7.5 mg by mouth 2 (two) times daily. For Anxiety   Camphor-Menthol-Methyl Sal (SALONPAS)  3.07-27-08 % PTCH Apply topically. 1 patch, topical, Twice A Day, apply on Right foot,right forearm,and Left Shoulder and neck in the AM and remove in the PM for pain management   ergocalciferol (VITAMIN D2) 1.25 MG (50000 UT) capsule Take by mouth. 1,250 mcg (50,000 unit); oral Once A Day on Thu   hydrocortisone cream 0.5 % Apply 1 application topically 2 (two) times daily. Special Instructions: BID prn rash to left foot and left knee till healed   lisinopril (ZESTRIL) 10 MG tablet Take 10 mg by mouth daily.   LORazepam (ATIVAN) 0.5 MG tablet Take 0.5 tablets (0.25 mg total) by mouth at bedtime.   melatonin 3 MG TABS tablet Take 3 mg by mouth at bedtime.   metFORMIN (GLUCOPHAGE) 500 MG tablet Take 1 tablet by mouth 2  (two) times daily.   NON FORMULARY Diet Change: Regular, carbohydrate consistent with thin liquids   NON FORMULARY Accu-check qam. Notify provider of results under 60 or over 400. Once A Day   Soap & Cleansers (CETAPHIL GENTLE CLEANSER EX) Apply 1 application topically daily. Wash skin with cleanser   vitamin C (ASCORBIC ACID) 500 MG tablet Take 500 mg by mouth 2 (two) times daily.   No facility-administered encounter medications on file as of 01/18/2021.     SIGNIFICANT DIAGNOSTIC EXAMS  PREVIOUS   07-26-18: 2-d echo:  Left Ventricle: Left ventricular ejection fraction, by visual estimation, is 65 to 70%. The left ventricle has normal function. The left ventricle is not well visualized. There is moderately increased left ventricular hypertrophy. Left ventricular  diastolic parameters are consistent with Grade I diastolic dysfunction (impaired relaxation). Elevated left ventricular end-diastolic pressure.   Right Ventricle: The right ventricular size is normal. No increase in right ventricular wall thickness. Global RV systolic function is has normal systolic function. Left Atrium: Left atrial size was normal in size. Right Atrium: Right atrial size was normal in size Pericardium: There is no evidence of pericardial effusion. Mitral Valve: The mitral valve is normal in structure. No evidence of mitral valve regurgitation. No evidence of mitral valve stenosis by observation. MV peak gradient, 7.4 mmHg. Tricuspid Valve: The tricuspid valve is normal in structure. Tricuspid valve regurgitation is trivial.   Aortic Valve: The aortic valve was not well visualized. Aortic valve regurgitation is not visualized. Mild aortic valve sclerosis is present, with no evidence of aortic valve stenosis. Aortic valve mean gradient measures 6.0 mmHg. Aortic valve peak  gradient measures 11.0 mmHg. Aortic valve area, by VTI measures 2.05 cm.  07-31-19: MRI/MRA of head:  Parenchymal hemorrhage involving the left  thalamus and adjacent white matter with intraventricular extension. Associated mass effect is similar to recent CT. No hydrocephalus. Moderate chronic microvascular ischemic changes. Mild irregularity and stenosis of left P1 PCA. Otherwise unremarkable MRA of the head.  02-03-20: DEXA t score -1.009  03-14-20: ct of head and cervical spine:  1. No evidence of acute intracranial process. 2. No acute cervical spine fracture or listhesis. 3. Small soft tissue laceration at the right parietal scalp at the vertex with associated small subcutaneous hematoma and small right parietal scalp hematoma. 4. Moderate to severe multilevel degenerative disc disease of the cervical spine, most severe at C4-5 where there is a large herniated disc material. The AP diameter of the spinal canal in this region is approximately 5-6 mm. Multilevel mild neural foraminal narrowing, most severe on the left at C6-7 where there is also uncovertebral and facet arthrosis. 5. Moderate periventricular white matter changes  likely reflecting the sequela of small vessel ischemia. Previously noted intraparenchymal hematoma within the left basal ganglia and intraventricular hemorrhage has resolved. There is residual encephalomalacia within the posterior limb of the left internal capsule and left thalamus.   NO NEW EXAMS.   LABS REVIEWED PREVIOUS   SHE IS DECLINING BLOOD DRAWS   12-16-19: urine micro-albumin 8.3 ( on ACE)  hgb a1c 6.9 12-23-19: chol 94; ldl 33 trig 93 hdl 42 vit B 12: 371 02-03-20: wbc 7.9; hgb 13.5; hct 42.3; mcv 93.8 plt 223; glucose  129; bun 11; creat 0.44; k+ 4.1; na++ 133 ca 9.0 liver normal albumin 3.1  05-04-20: hgb a1c 6.4   NO NEW LABS   Review of Systems  Constitutional:  Negative for malaise/fatigue.  Respiratory:  Negative for cough and shortness of breath.   Cardiovascular:  Negative for chest pain, palpitations and leg swelling.  Gastrointestinal:  Negative for abdominal pain, constipation and  heartburn.  Musculoskeletal:  Negative for back pain, joint pain and myalgias.  Skin: Negative.   Neurological:  Negative for dizziness.  Psychiatric/Behavioral:  The patient is not nervous/anxious.    Physical Exam Constitutional:      General: She is not in acute distress.    Appearance: She is well-developed. She is not diaphoretic.  Neck:     Thyroid: No thyromegaly.  Cardiovascular:     Rate and Rhythm: Normal rate and regular rhythm.     Pulses: Normal pulses.     Heart sounds: Normal heart sounds.  Pulmonary:     Effort: Pulmonary effort is normal. No respiratory distress.     Breath sounds: Normal breath sounds.  Abdominal:     General: Bowel sounds are normal. There is no distension.     Palpations: Abdomen is soft.     Tenderness: There is no abdominal tenderness.  Musculoskeletal:     Cervical back: Neck supple.     Right lower leg: No edema.     Left lower leg: No edema.     Comments:  Right hemiplegia with contractures.    Lymphadenopathy:     Cervical: No cervical adenopathy.  Skin:    General: Skin is warm and dry.  Neurological:     Mental Status: She is alert. Mental status is at baseline.  Psychiatric:        Mood and Affect: Mood normal.      ASSESSMENT/ PLAN:  TODAY  Vascular dementia with behavioral disturbance Hemiplegia of right dominant side as late effect of cerebral infarction (CVA) unspecified hemiplegia type SARS CoV-2 positive Major depression with psychotic features   Will continue current medications Will continue current plan of care Will continue to monitor her status.   Time spent: 40 minutes: plan of care: declining of medications etc; medications.    Synthia Innocent NP [facility]  Contact [phone] Monday through Friday 8am- 5pm  After hours call [phone]

## 2021-02-01 DIAGNOSIS — F331 Major depressive disorder, recurrent, moderate: Secondary | ICD-10-CM | POA: Diagnosis not present

## 2021-02-02 ENCOUNTER — Non-Acute Institutional Stay (SKILLED_NURSING_FACILITY): Payer: Medicare Other | Admitting: Internal Medicine

## 2021-02-02 ENCOUNTER — Encounter (HOSPITAL_COMMUNITY)
Admission: RE | Admit: 2021-02-02 | Discharge: 2021-02-02 | Disposition: A | Discharge status: Home / Self Care | Payer: Medicare Other | Source: Skilled Nursing Facility | Attending: Internal Medicine | Admitting: Internal Medicine

## 2021-02-02 ENCOUNTER — Encounter: Payer: Self-pay | Admitting: Internal Medicine

## 2021-02-02 DIAGNOSIS — U071 COVID-19: Secondary | ICD-10-CM | POA: Diagnosis not present

## 2021-02-02 DIAGNOSIS — R609 Edema, unspecified: Secondary | ICD-10-CM | POA: Diagnosis not present

## 2021-02-02 DIAGNOSIS — R600 Localized edema: Secondary | ICD-10-CM | POA: Diagnosis not present

## 2021-02-02 LAB — D-DIMER, QUANTITATIVE: D-Dimer, Quant: 1.8 ug/mL-FEU — ABNORMAL HIGH (ref 0.00–0.50)

## 2021-02-02 NOTE — Assessment & Plan Note (Addendum)
D dimer & RUE venous Doppler

## 2021-02-02 NOTE — Assessment & Plan Note (Addendum)
She refused D dimer when PCR positive in late June ; RUE edema & PMH of CVA reinforces need for this test.

## 2021-02-02 NOTE — Progress Notes (Signed)
   NURSING HOME LOCATION:  [facility] ROOM NUMBER:  114 W  CODE STATUS:    PCP:  Synthia Innocent NP  This is a nursing facility follow up visit for specific acute issue of RUE edema.  Interim medical record and care since last SNF visit was updated with review of diagnostic studies and change in clinical status since last visit were documented.  HPI: She describes edema of the right upper AND lower extremity beginning within the past 48 hours. She denies any injury to the extremity.There is dull pain associated with the edema. The edema is in the context of right-sided hemiparesis related to prior stroke. She is on Amlodipine but the edema is localized to RUE. She denies any Covid symptoms; but she was positive in late June.D dimer & CRP ordered @ that time were declined by her.  Past medical history includes essential hypertension, CAD, and diabetes with vascular complications.  Review of systems:  Constitutional: No fever, significant weight change  Eyes: No redness, discharge, pain, vision change ENT/mouth: No nasal congestion,  purulent discharge, earache, change in hearing, sore throat  Cardiovascular: No chest pain, palpitations, paroxysmal nocturnal dyspnea  Respiratory: No cough, sputum production Dermatologic: No rash, pruritus, change in appearance of skin Neurologic: No numbness, tingling in RUE  Physical exam:  Pertinent or positive findings: She exhibits the residual stigmata of the prior stroke with right hemiparesis and facial asymmetry.  Specifically the corner of the right mouth does sag.  She exhibits patchy alopecia mainly over the crown.  There is faint periorbital hyperpigmentation bilaterally.  She is edentulous but wearing only the upper plate.  Breath sounds are decreased.  S1 is slightly increased.  The pedal pulses are surprisingly strong.  There is no edema in either lower extremity.  The right upper extremity is visably edematous from the  shoulder down. It is doughy to palpation. Knees are fusiformly enlarged. The great toenails are absent.  General appearance: Adequately nourished; no acute distress, increased work of breathing is present.   Lymphatic: No lymphadenopathy about the head, neck, axilla. Eyes: No conjunctival inflammation or lid edema is present. There is no scleral icterus. Ears:  External ear exam shows no significant lesions or deformities.   Nose:  External nasal examination shows no deformity or inflammation. Nasal mucosa are pink and moist without lesions, exudates Oral exam:  Lips and gums are healthy appearing. There is no oropharyngeal erythema or exudate. Neck:  No thyromegaly, masses, tenderness noted.    Heart:  Normal rate and regular rhythm.  S2 normal without gallop, murmur, click, rub .  Lungs:  without wheezes, rhonchi, rales, rubs. Abdomen: Bowel sounds are normal. Abdomen is soft and nontender with no organomegaly, hernias, masses. GU: Deferred  Extremities:  No cyanosis, clubbing  Neurologic exam :Balance, Rhomberg, finger to nose testing could not be completed due to clinical state Skin: Warm & dry w/o tenting.  See summary under each active problem in the Problem List with associated updated therapeutic plan

## 2021-02-02 NOTE — Patient Instructions (Signed)
See assessment and plan under each diagnosis in the problem list and acutely for this visit 

## 2021-02-06 ENCOUNTER — Other Ambulatory Visit (HOSPITAL_COMMUNITY): Payer: Medicare Other

## 2021-02-06 DIAGNOSIS — R2231 Localized swelling, mass and lump, right upper limb: Secondary | ICD-10-CM | POA: Diagnosis not present

## 2021-02-06 DIAGNOSIS — I82611 Acute embolism and thrombosis of superficial veins of right upper extremity: Secondary | ICD-10-CM | POA: Diagnosis not present

## 2021-02-06 DIAGNOSIS — M79621 Pain in right upper arm: Secondary | ICD-10-CM | POA: Diagnosis not present

## 2021-02-06 DIAGNOSIS — U071 COVID-19: Secondary | ICD-10-CM | POA: Diagnosis not present

## 2021-02-07 ENCOUNTER — Ambulatory Visit (HOSPITAL_COMMUNITY): Payer: No Typology Code available for payment source

## 2021-02-07 ENCOUNTER — Encounter (HOSPITAL_COMMUNITY): Payer: Self-pay

## 2021-02-09 ENCOUNTER — Other Ambulatory Visit: Payer: Self-pay | Admitting: Adult Health

## 2021-02-09 MED ORDER — LORAZEPAM 0.5 MG PO TABS: 0.2500 mg | ORAL_TABLET | Freq: Every day | ORAL | 0 refills | Status: DC

## 2021-02-20 ENCOUNTER — Other Ambulatory Visit: Payer: Self-pay | Admitting: Adult Health

## 2021-03-08 DIAGNOSIS — F331 Major depressive disorder, recurrent, moderate: Secondary | ICD-10-CM | POA: Diagnosis not present

## 2021-03-09 ENCOUNTER — Other Ambulatory Visit: Payer: Self-pay | Admitting: Adult Health

## 2021-03-09 MED ORDER — LORAZEPAM 0.5 MG PO TABS: 0.2500 mg | ORAL_TABLET | Freq: Every day | ORAL | 0 refills | Status: DC

## 2021-03-12 ENCOUNTER — Non-Acute Institutional Stay (SKILLED_NURSING_FACILITY): Payer: Medicare Other | Admitting: Adult Health

## 2021-03-12 ENCOUNTER — Encounter: Payer: Self-pay | Admitting: Adult Health

## 2021-03-12 DIAGNOSIS — E1151 Type 2 diabetes mellitus with diabetic peripheral angiopathy without gangrene: Secondary | ICD-10-CM

## 2021-03-12 DIAGNOSIS — E1159 Type 2 diabetes mellitus with other circulatory complications: Secondary | ICD-10-CM | POA: Diagnosis not present

## 2021-03-12 DIAGNOSIS — K5909 Other constipation: Secondary | ICD-10-CM

## 2021-03-12 DIAGNOSIS — I152 Hypertension secondary to endocrine disorders: Secondary | ICD-10-CM | POA: Diagnosis not present

## 2021-03-12 NOTE — Progress Notes (Signed)
Location:  [facility] Room Number: 123-P Place of Service:  SNF (31)   CODE STATUS: DNR  Allergies  Allergen Reactions   Contrast Media [Iodinated Diagnostic Agents] Anaphylaxis   Betamethasone Valerate Other (See Comments)    Chief Complaint  Patient presents with   Medical Management of Chronic Issues           Chronic constipation: . Hypertension associated with type 2 diabetes mellitus:  Type 2 diabetes mellitus with peripheral artery disease    HPI:  She is a 87 year old long term resident of this facility being seen for the management of her chronic illnesses:  Chronic constipation: . Hypertension associated with type 2 diabetes mellitus:  Type 2 diabetes mellitus with peripheral artery disease. There are no reports of uncontrolled pain; no reports of anxiety or insomnia.   Past Medical History:  Diagnosis Date   Arthritis    Coronary artery disease    angina   Diabetes mellitus without complication (HCC)    Hypertension     Past Surgical History:  Procedure Laterality Date   APPENDECTOMY     CHOLECYSTECTOMY      Social History   Socioeconomic History   Marital status: Widowed    Spouse name: Not on file   Number of children: Not on file   Years of education: Not on file   Highest education level: Not on file  Occupational History   Not on file  Tobacco Use   Smoking status: Never   Smokeless tobacco: Never  Vaping Use   Vaping Use: Never used  Substance and Sexual Activity   Alcohol use: No   Drug use: No   Sexual activity: Not on file  Other Topics Concern   Not on file  Social History Narrative   Not on file   Social Determinants of Health   Financial Resource Strain: Not on file  Food Insecurity: Not on file  Transportation Needs: Not on file  Physical Activity: Not on file  Stress: Not on file  Social Connections: Not on file  Intimate Partner Violence: Not on file   History reviewed. No pertinent family  history.    VITAL SIGNS BP (!) 141/64   Pulse 67   Temp (!) 97.5 F (36.4 C)   Resp 18   Ht 5\' 4"  (1.626 m)   Wt 171 lb 6.4 oz (77.7 kg)   SpO2 96%   BMI 29.42 kg/m   Outpatient Encounter Medications as of 03/12/2021  Medication Sig   acetaminophen (TYLENOL) 325 MG tablet Take 650 mg by mouth every 6 (six) hours as needed.    amLODipine (NORVASC) 10 MG tablet Take 1 tablet (10 mg total) by mouth daily.   atorvastatin (LIPITOR) 20 MG tablet Take 1 tablet (20 mg total) by mouth daily at 6 PM.   Balsam [place]-Castor Oil (VENELEX) OINT Apply topically. Apply to sacrum and bilateral buttocks qshift for prevention.   busPIRone (BUSPAR) 7.5 MG tablet Take 7.5 mg by mouth 2 (two) times daily. For Anxiety   Camphor-Menthol-Methyl Sal (SALONPAS) 3.07-27-08 % PTCH Apply topically. 1 patch, topical, Twice A Day, apply on Right foot,right forearm,and Left Shoulder and neck in the AM and remove in the PM for pain management   lisinopril (ZESTRIL) 10 MG tablet Take 10 mg by mouth daily.   LORazepam (ATIVAN) 0.5 MG tablet Take 0.5 tablets (0.25 mg total) by mouth at bedtime.   melatonin 3 MG TABS tablet Take 3 mg by  mouth at bedtime.   metFORMIN (GLUCOPHAGE) 500 MG tablet Take 1 tablet by mouth 2 (two) times daily.   NON FORMULARY Diet Change: Regular, carbohydrate consistent with thin liquids   NON FORMULARY Accu-check qam. Notify provider of results under 60 or over 400. Once A Day   Soap & Cleansers (CETAPHIL GENTLE CLEANSER EX) Apply 1 application topically daily. Wash skin with cleanser   [DISCONTINUED] ergocalciferol (VITAMIN D2) 1.25 MG (50000 UT) capsule Take by mouth. 1,250 mcg (50,000 unit); oral Once A Day on Thu   [DISCONTINUED] hydrocortisone cream 0.5 % Apply 1 application topically 2 (two) times daily. Special Instructions: BID prn rash to left foot and left knee till healed   [DISCONTINUED] vitamin C (ASCORBIC ACID) 500 MG tablet Take 500 mg by mouth 2 (two) times daily.   No  facility-administered encounter medications on file as of 03/12/2021.     SIGNIFICANT DIAGNOSTIC EXAMS  PREVIOUS   07-26-18: 2-d echo:  Left Ventricle: Left ventricular ejection fraction, by visual estimation, is 65 to 70%. The left ventricle has normal function. The left ventricle is not well visualized. There is moderately increased left ventricular hypertrophy. Left ventricular  diastolic parameters are consistent with Grade I diastolic dysfunction (impaired relaxation). Elevated left ventricular end-diastolic pressure.   Right Ventricle: The right ventricular size is normal. No increase in right ventricular wall thickness. Global RV systolic function is has normal systolic function. Left Atrium: Left atrial size was normal in size. Right Atrium: Right atrial size was normal in size Pericardium: There is no evidence of pericardial effusion. Mitral Valve: The mitral valve is normal in structure. No evidence of mitral valve regurgitation. No evidence of mitral valve stenosis by observation. MV peak gradient, 7.4 mmHg. Tricuspid Valve: The tricuspid valve is normal in structure. Tricuspid valve regurgitation is trivial.   Aortic Valve: The aortic valve was not well visualized. Aortic valve regurgitation is not visualized. Mild aortic valve sclerosis is present, with no evidence of aortic valve stenosis. Aortic valve mean gradient measures 6.0 mmHg. Aortic valve peak  gradient measures 11.0 mmHg. Aortic valve area, by VTI measures 2.05 cm.  02-03-20: DEXA t score -1.009    NO NEW EXAMS.   LABS REVIEWED PREVIOUS   SHE IS DECLINING BLOOD DRAWS   12-16-19: urine micro-albumin 8.3 ( on ACE)  hgb a1c 6.9 12-23-19: chol 94; ldl 33 trig 93 hdl 42 vit B 12: 371 02-03-20: wbc 7.9; hgb 13.5; hct 42.3; mcv 93.8 plt 223; glucose  129; bun 11; creat 0.44; k+ 4.1; na++ 133 ca 9.0 liver normal albumin 3.1  05-04-20: hgb a1c 6.4  02-02-21: d-dimer 1.80  NO NEW LABS   Review of Systems  Constitutional:   Negative for malaise/fatigue.  Respiratory:  Negative for cough and shortness of breath.   Cardiovascular:  Negative for chest pain, palpitations and leg swelling.  Gastrointestinal:  Negative for abdominal pain, constipation and heartburn.  Musculoskeletal:  Negative for back pain, joint pain and myalgias.  Skin: Negative.   Neurological:  Negative for dizziness.  Psychiatric/Behavioral:  The patient is not nervous/anxious.     Physical Exam Constitutional:      General: She is not in acute distress.    Appearance: She is well-developed. She is not diaphoretic.  Neck:     Thyroid: No thyromegaly.  Cardiovascular:     Rate and Rhythm: Normal rate and regular rhythm.     Pulses: Normal pulses.     Heart sounds: Normal heart sounds.  Pulmonary:  Effort: Pulmonary effort is normal. No respiratory distress.     Breath sounds: Normal breath sounds.  Abdominal:     General: Bowel sounds are normal. There is no distension.     Palpations: Abdomen is soft.     Tenderness: There is no abdominal tenderness.  Musculoskeletal:     Cervical back: Neck supple.     Right lower leg: No edema.     Left lower leg: No edema.     Comments: Right hemiplegia with contractures.     Lymphadenopathy:     Cervical: No cervical adenopathy.  Skin:    General: Skin is warm and dry.  Neurological:     Mental Status: She is alert. Mental status is at baseline.  Psychiatric:        Mood and Affect: Mood normal.    ASSESSMENT/ PLAN:  TODAY  Chronic constipation: is stable will continue senna s twice daily   2. Hypertension associated with type 2 diabetes mellitus: is stable b/p 141/64 will continue lisinpril 10 mg daily and norvasc 10 mg daily   3. Type 2 diabetes mellitus with peripheral artery disease is stable hgb a1c 6.4; will continue metformin 500 mg twice daily is on ace statin.   PREVIOUS  4. Major depression with psychotic features. : is stable is off zoloft per her request; will  continue ativan 0.25 mg nightly (has failed one wean) buspar 7.5 mg twice daily has declined all changes to this medication.  5. IVH (intraventricular hemorrhage) hemiplegia of right dominant side as late effect of cerebral infarction unspecified hemiplegia type; is stable  6. Dysphagia due to CVA: is stable no signs of aspiration present; will continue thin liquids  7. Dyslipidemia associated with type 2 diabetes mellitus: is stable LDL 22 will continue lipitor 20 mg daily    Will check cbc; cmp; hgb a1c tsh lipids urine microalbumin    Synthia Innocent NP [facility]  Contact [phone] Monday through Friday 8am- 5pm  After hours call [phone]

## 2021-04-06 ENCOUNTER — Encounter: Payer: Self-pay | Admitting: Adult Health

## 2021-04-06 ENCOUNTER — Non-Acute Institutional Stay (SKILLED_NURSING_FACILITY): Payer: Medicare Other | Admitting: Adult Health

## 2021-04-06 DIAGNOSIS — I69351 Hemiplegia and hemiparesis following cerebral infarction affecting right dominant side: Secondary | ICD-10-CM

## 2021-04-06 DIAGNOSIS — I69391 Dysphagia following cerebral infarction: Secondary | ICD-10-CM | POA: Diagnosis not present

## 2021-04-06 DIAGNOSIS — I615 Nontraumatic intracerebral hemorrhage, intraventricular: Secondary | ICD-10-CM

## 2021-04-06 DIAGNOSIS — F323 Major depressive disorder, single episode, severe with psychotic features: Secondary | ICD-10-CM | POA: Diagnosis not present

## 2021-04-06 NOTE — Progress Notes (Signed)
Location:  [facility] Room Number: 158-P Place of Service:  SNF (31)   CODE STATUS: DNR  Allergies  Allergen Reactions   Contrast Media [Iodinated Diagnostic Agents] Anaphylaxis   Betamethasone Valerate Other (See Comments)    Chief Complaint  Patient presents with   Medical Management of Chronic Issues              Major depression with psychotic features:    IVH (intraventricular hemorrhage)/ hemiplegia right dominant side as late effect of cerebral infarction unspecified hemiplegia type:    Dysphagia due to CVA    HPI:  She is a 87 year old long term resident of this facility being seen for the management of her chronic illnesses: Major depression with psychotic features:    IVH (intraventricular hemorrhage)/ hemiplegia right dominant side as late effect of cerebral infarction unspecified hemiplegia type:    Dysphagia due to CVA. There are no reports of uncontrolled pain. She continues to remain in bed per her choice. There are no reports of aspiration present; she continues on thin liquids.   Past Medical History:  Diagnosis Date   Arthritis    Coronary artery disease    angina   Diabetes mellitus without complication (HCC)    Hypertension     Past Surgical History:  Procedure Laterality Date   APPENDECTOMY     CHOLECYSTECTOMY      Social History   Socioeconomic History   Marital status: Widowed    Spouse name: Not on file   Number of children: Not on file   Years of education: Not on file   Highest education level: Not on file  Occupational History   Not on file  Tobacco Use   Smoking status: Never   Smokeless tobacco: Never  Vaping Use   Vaping Use: Never used  Substance and Sexual Activity   Alcohol use: No   Drug use: No   Sexual activity: Not on file  Other Topics Concern   Not on file  Social History Narrative   Not on file   Social Determinants of Health   Financial Resource Strain: Not on file  Food Insecurity: Not  on file  Transportation Needs: Not on file  Physical Activity: Not on file  Stress: Not on file  Social Connections: Not on file  Intimate Partner Violence: Not on file   History reviewed. No pertinent family history.    VITAL SIGNS BP 136/69   Pulse 60   Temp (!) 97.4 F (36.3 C)   Resp 18   Ht 5\' 4"  (1.626 m)   Wt 164 lb (74.4 kg)   SpO2 96%   BMI 28.15 kg/m   Outpatient Encounter Medications as of 04/06/2021  Medication Sig   acetaminophen (TYLENOL) 325 MG tablet Take 650 mg by mouth every 6 (six) hours as needed.    amLODipine (NORVASC) 10 MG tablet Take 1 tablet (10 mg total) by mouth daily.   atorvastatin (LIPITOR) 20 MG tablet Take 1 tablet (20 mg total) by mouth daily at 6 PM.   Balsam [place]-Castor Oil (VENELEX) OINT Apply topically. Apply to sacrum and bilateral buttocks qshift for prevention.   busPIRone (BUSPAR) 7.5 MG tablet Take 7.5 mg by mouth 2 (two) times daily. For Anxiety   Camphor-Menthol-Methyl Sal (SALONPAS) 3.07-27-08 % PTCH Apply topically. 1 patch, topical, Twice A Day, apply on Right foot,right forearm,and Left Shoulder and neck in the AM and remove in the PM for pain management   lisinopril (  ZESTRIL) 10 MG tablet Take 10 mg by mouth daily.   LORazepam (ATIVAN) 0.5 MG tablet Take 0.5 tablets (0.25 mg total) by mouth at bedtime.   melatonin 3 MG TABS tablet Take 3 mg by mouth at bedtime.   metFORMIN (GLUCOPHAGE) 500 MG tablet Take 1 tablet by mouth 2 (two) times daily.   NON FORMULARY Diet Change: Regular, carbohydrate consistent with thin liquids   NON FORMULARY Accu-check qam. Notify provider of results under 60 or over 400. Once A Day   [DISCONTINUED] Soap & Cleansers (CETAPHIL GENTLE CLEANSER EX) Apply 1 application topically daily. Wash skin with cleanser   No facility-administered encounter medications on file as of 04/06/2021.     SIGNIFICANT DIAGNOSTIC EXAMS  PREVIOUS   07-26-18: 2-d echo:  Left Ventricle: Left ventricular ejection fraction,  by visual estimation, is 65 to 70%. The left ventricle has normal function. The left ventricle is not well visualized. There is moderately increased left ventricular hypertrophy. Left ventricular  diastolic parameters are consistent with Grade I diastolic dysfunction (impaired relaxation). Elevated left ventricular end-diastolic pressure.   Right Ventricle: The right ventricular size is normal. No increase in right ventricular wall thickness. Global RV systolic function is has normal systolic function. Left Atrium: Left atrial size was normal in size. Right Atrium: Right atrial size was normal in size Pericardium: There is no evidence of pericardial effusion. Mitral Valve: The mitral valve is normal in structure. No evidence of mitral valve regurgitation. No evidence of mitral valve stenosis by observation. MV peak gradient, 7.4 mmHg. Tricuspid Valve: The tricuspid valve is normal in structure. Tricuspid valve regurgitation is trivial.   Aortic Valve: The aortic valve was not well visualized. Aortic valve regurgitation is not visualized. Mild aortic valve sclerosis is present, with no evidence of aortic valve stenosis. Aortic valve mean gradient measures 6.0 mmHg. Aortic valve peak  gradient measures 11.0 mmHg. Aortic valve area, by VTI measures 2.05 cm.  02-03-20: DEXA t score -1.009    NO NEW EXAMS.   LABS REVIEWED PREVIOUS   SHE IS DECLINING BLOOD DRAWS   12-16-19: urine micro-albumin 8.3 ( on ACE)  hgb a1c 6.9 12-23-19: chol 94; ldl 33 trig 93 hdl 42 vit B 12: 371 02-03-20: wbc 7.9; hgb 13.5; hct 42.3; mcv 93.8 plt 223; glucose  129; bun 11; creat 0.44; k+ 4.1; na++ 133 ca 9.0 liver normal albumin 3.1  05-04-20: hgb a1c 6.4  02-02-21: d-dimer 1.80  NO NEW LABS   Review of Systems  Constitutional:  Negative for malaise/fatigue.  Respiratory:  Negative for cough and shortness of breath.   Cardiovascular:  Negative for chest pain, palpitations and leg swelling.  Gastrointestinal:  Negative  for abdominal pain, constipation and heartburn.  Musculoskeletal:  Negative for back pain, joint pain and myalgias.  Skin: Negative.   Neurological:  Negative for dizziness.  Psychiatric/Behavioral:  The patient is not nervous/anxious.       Physical Exam Constitutional:      General: She is not in acute distress.    Appearance: She is well-developed. She is not diaphoretic.  Neck:     Thyroid: No thyromegaly.  Cardiovascular:     Rate and Rhythm: Normal rate and regular rhythm.     Pulses: Normal pulses.     Heart sounds: Normal heart sounds.  Pulmonary:     Effort: Pulmonary effort is normal. No respiratory distress.     Breath sounds: Normal breath sounds.  Abdominal:     General: Bowel sounds are  normal. There is no distension.     Palpations: Abdomen is soft.     Tenderness: There is no abdominal tenderness.  Musculoskeletal:     Cervical back: Neck supple.     Right lower leg: No edema.     Left lower leg: No edema.     Comments:  Right hemiplegia with contractures.   Lymphadenopathy:     Cervical: No cervical adenopathy.  Skin:    General: Skin is warm and dry.  Neurological:     Mental Status: She is alert. Mental status is at baseline.  Psychiatric:        Mood and Affect: Mood normal.     ASSESSMENT/ PLAN:  TODAY  Major depression with psychotic features: is without change: will continue ativan 0.25 mg nightly ( has failed one wean) buspar 7.5 mg twice daily stopped zoloft due to her request; she will not allow for any alterations in her medications  2. IVH (intraventricular hemorrhage)/ hemiplegia right dominant side as late effect of cerebral infarction unspecified hemiplegia type: is without change will monitor  3. Dysphagia due to CVA: is without signs of aspiration present is on thin liquids.   PREVIOUS  4. Dyslipidemia associated with type 2 diabetes mellitus: is stable LDL 22 will continue lipitor 20 mg daily   5. Chronic constipation: is stable  will continue senna s twice daily   6. Hypertension associated with type 2 diabetes mellitus: is stable b/p 136/69 will continue lisinpril 10 mg daily and norvasc 10 mg daily   7. Type 2 diabetes mellitus with peripheral artery disease is stable hgb a1c 6.4; will continue metformin 500 mg twice daily is on ace statin.       Synthia Innocent NP [facility]  Contact [phone] Monday through Friday 8am- 5pm  After hours call [phone]

## 2021-04-11 ENCOUNTER — Encounter (HOSPITAL_COMMUNITY)
Admission: RE | Admit: 2021-04-11 | Discharge: 2021-04-11 | Disposition: A | Discharge status: Home / Self Care | Payer: Medicare Other | Source: Skilled Nursing Facility | Attending: Internal Medicine | Admitting: Internal Medicine

## 2021-04-11 DIAGNOSIS — E1159 Type 2 diabetes mellitus with other circulatory complications: Secondary | ICD-10-CM | POA: Diagnosis not present

## 2021-04-11 LAB — LIPID PANEL
Cholesterol: 115 mg/dL (ref 0–200)
HDL: 47 mg/dL (ref >40)
LDL Cholesterol: 43 mg/dL (ref 0–99)
Total CHOL/HDL Ratio: 2.4 RATIO
Triglycerides: 124 mg/dL (ref <150)
VLDL: 25 mg/dL (ref 0–40)

## 2021-04-11 LAB — VITAMIN D 25 HYDROXY (VIT D DEFICIENCY, FRACTURES): Vit D, 25-Hydroxy: 29.23 ng/mL — ABNORMAL LOW (ref 30–100)

## 2021-04-11 LAB — CBC WITH DIFFERENTIAL/PLATELET
Abs Immature Granulocytes: 0.01 10*3/uL (ref 0.00–0.07)
Basophils Absolute: 0.1 10*3/uL (ref 0.0–0.1)
Basophils Relative: 1 %
Eosinophils Absolute: 0.3 10*3/uL (ref 0.0–0.5)
Eosinophils Relative: 3 %
HCT: 43.6 % (ref 36.0–46.0)
Hemoglobin: 14.2 g/dL (ref 12.0–15.0)
Immature Granulocytes: 0 %
Lymphocytes Relative: 20 %
Lymphs Abs: 1.8 10*3/uL (ref 0.7–4.0)
MCH: 30.6 pg (ref 26.0–34.0)
MCHC: 32.6 g/dL (ref 30.0–36.0)
MCV: 94 fL (ref 80.0–100.0)
Monocytes Absolute: 0.7 10*3/uL (ref 0.1–1.0)
Monocytes Relative: 8 %
Neutro Abs: 6 10*3/uL (ref 1.7–7.7)
Neutrophils Relative %: 68 %
Platelets: 217 10*3/uL (ref 150–400)
RBC: 4.64 MIL/uL (ref 3.87–5.11)
RDW: 13.5 % (ref 11.5–15.5)
WBC: 8.7 10*3/uL (ref 4.0–10.5)
nRBC: 0 % (ref 0.0–0.2)

## 2021-04-11 LAB — COMPREHENSIVE METABOLIC PANEL
ALT: 29 U/L (ref 0–44)
AST: 33 U/L (ref 15–41)
Albumin: 3.4 g/dL — ABNORMAL LOW (ref 3.5–5.0)
Alkaline Phosphatase: 69 U/L (ref 38–126)
Anion gap: 7 (ref 5–15)
BUN: 16 mg/dL (ref 8–23)
CO2: 26 mmol/L (ref 22–32)
Calcium: 9.1 mg/dL (ref 8.9–10.3)
Chloride: 102 mmol/L (ref 98–111)
Creatinine, Ser: 0.6 mg/dL (ref 0.44–1.00)
GFR, Estimated: >60 mL/min (ref >60)
Glucose, Bld: 143 mg/dL — ABNORMAL HIGH (ref 70–99)
Potassium: 4 mmol/L (ref 3.5–5.1)
Sodium: 135 mmol/L (ref 135–145)
Total Bilirubin: 0.4 mg/dL (ref 0.3–1.2)
Total Protein: 7.1 g/dL (ref 6.5–8.1)

## 2021-04-11 LAB — VITAMIN B12: Vitamin B-12: 261 pg/mL (ref 180–914)

## 2021-04-11 LAB — TSH: TSH: 1.386 u[IU]/mL (ref 0.350–4.500)

## 2021-04-12 ENCOUNTER — Encounter: Payer: Self-pay | Admitting: Adult Health

## 2021-04-12 ENCOUNTER — Non-Acute Institutional Stay (SKILLED_NURSING_FACILITY): Payer: Medicare Other | Admitting: Adult Health

## 2021-04-12 DIAGNOSIS — E538 Deficiency of other specified B group vitamins: Secondary | ICD-10-CM | POA: Diagnosis not present

## 2021-04-12 DIAGNOSIS — E559 Vitamin D deficiency, unspecified: Secondary | ICD-10-CM

## 2021-04-12 DIAGNOSIS — E1151 Type 2 diabetes mellitus with diabetic peripheral angiopathy without gangrene: Secondary | ICD-10-CM

## 2021-04-12 LAB — HEMOGLOBIN A1C
Hgb A1c MFr Bld: 6.3 % — ABNORMAL HIGH (ref 4.8–5.6)
Mean Plasma Glucose: 134 mg/dL

## 2021-04-12 NOTE — Progress Notes (Signed)
Location:  [facility] Room Number: 158-P Place of Service:  SNF (31) Provider: Sharee Holster, NP  CODE STATUS: DNR  Allergies  Allergen Reactions   Contrast Media [Iodinated Diagnostic Agents] Anaphylaxis   Betamethasone Valerate Other (See Comments)    Chief Complaint  Patient presents with   Acute Visit    Follow Up Lab Work.     HPI:  Her hgb a1c is good at 6.3. her vit b and d levels are low. We have discussed supplementing them. She does not want to take any more medications. She would like to have her ativan increased. We have discussed why this could not happen she verbalized understanding but does not agree.   Past Medical History:  Diagnosis Date   Arthritis    Coronary artery disease    angina   Diabetes mellitus without complication (HCC)    Hypertension     Past Surgical History:  Procedure Laterality Date   APPENDECTOMY     CHOLECYSTECTOMY      Social History   Socioeconomic History   Marital status: Widowed    Spouse name: Not on file   Number of children: Not on file   Years of education: Not on file   Highest education level: Not on file  Occupational History   Not on file  Tobacco Use   Smoking status: Never   Smokeless tobacco: Never  Vaping Use   Vaping Use: Never used  Substance and Sexual Activity   Alcohol use: No   Drug use: No   Sexual activity: Not on file  Other Topics Concern   Not on file  Social History Narrative   Not on file   Social Determinants of Health   Financial Resource Strain: Not on file  Food Insecurity: Not on file  Transportation Needs: Not on file  Physical Activity: Not on file  Stress: Not on file  Social Connections: Not on file  Intimate Partner Violence: Not on file   History reviewed. No pertinent family history.    VITAL SIGNS BP 129/67   Pulse 84   Temp 98.6 F (37 C)   Resp 18   Ht 5\' 4"  (1.626 m)   Wt 164 lb (74.4 kg)   SpO2 96%   BMI 28.15 kg/m    Outpatient Encounter Medications as of 04/12/2021  Medication Sig   acetaminophen (TYLENOL) 325 MG tablet Take 650 mg by mouth every 6 (six) hours as needed.    amLODipine (NORVASC) 10 MG tablet Take 1 tablet (10 mg total) by mouth daily.   atorvastatin (LIPITOR) 20 MG tablet Take 1 tablet (20 mg total) by mouth daily at 6 PM.   Balsam [place]-Castor Oil (VENELEX) OINT Apply topically. Apply to sacrum and bilateral buttocks qshift for prevention.   busPIRone (BUSPAR) 7.5 MG tablet Take 7.5 mg by mouth 2 (two) times daily. For Anxiety   Camphor-Menthol-Methyl Sal (SALONPAS) 3.07-27-08 % PTCH Apply topically. 1 patch, topical, Twice A Day, apply on Right foot,right forearm,and Left Shoulder and neck in the AM and remove in the PM for pain management   lisinopril (ZESTRIL) 10 MG tablet Take 10 mg by mouth daily.   LORazepam (ATIVAN) 0.5 MG tablet Take 0.5 tablets (0.25 mg total) by mouth at bedtime.   melatonin 3 MG TABS tablet Take 3 mg by mouth at bedtime.   metFORMIN (GLUCOPHAGE) 500 MG tablet Take 1 tablet by mouth 2 (two) times daily.   NON FORMULARY Diet Change: Regular,  carbohydrate consistent with thin liquids   NON FORMULARY Accu-check qam. Notify provider of results under 60 or over 400. Once A Day   No facility-administered encounter medications on file as of 04/12/2021.     SIGNIFICANT DIAGNOSTIC EXAMS  PREVIOUS   02-03-20: DEXA t score -1.009   NO NEW EXAMS.   LABS REVIEWED PREVIOUS   SHE IS DECLINING BLOOD DRAWS   05-04-20: hgb a1c 6.4  02-02-21: d-dimer 1.80  TODAY  04-11-21: wbc 8.7; hgb 14.2; hct 43.6; mcv 94.0 plt 217; glucose 143; bun 16; creat 0.60; k+ 4.0; na++ 135; ca 9.0 GFR>60; hgb a1c 6.3; chol 115; ldl 43; trig 134; hdl 47; tsh 1.386 vit B 12: 261 vit D 29.23  Review of Systems  Constitutional:  Negative for malaise/fatigue.  Respiratory:  Negative for cough and shortness of breath.   Cardiovascular:  Negative for chest pain, palpitations and leg swelling.   Gastrointestinal:  Negative for abdominal pain, constipation and heartburn.  Musculoskeletal:  Positive for joint pain. Negative for back pain and myalgias.       Arm pain   Skin: Negative.   Neurological:  Negative for dizziness.  Psychiatric/Behavioral:  The patient is not nervous/anxious.     Physical Exam Constitutional:      General: She is not in acute distress.    Appearance: She is well-developed. She is not diaphoretic.  Neck:     Thyroid: No thyromegaly.  Cardiovascular:     Rate and Rhythm: Normal rate and regular rhythm.     Pulses: Normal pulses.     Heart sounds: Normal heart sounds.  Pulmonary:     Effort: Pulmonary effort is normal. No respiratory distress.     Breath sounds: Normal breath sounds.  Abdominal:     General: Bowel sounds are normal. There is no distension.     Palpations: Abdomen is soft.     Tenderness: There is no abdominal tenderness.  Musculoskeletal:     Cervical back: Neck supple.     Right lower leg: No edema.     Left lower leg: No edema.     Comments: Right hemiplegia with contractures.    Lymphadenopathy:     Cervical: No cervical adenopathy.  Skin:    General: Skin is warm and dry.  Neurological:     Mental Status: She is alert. Mental status is at baseline.  Psychiatric:        Mood and Affect: Mood normal.     ASSESSMENT/ PLAN:  TODAY  Diabetes mellitus type 2 with peripheral artery disease Vit B 12 deficiency Vit D deficiency  At this time she does not want any supplements She will consider this and will let the staff know when she is ready to take them.    Synthia Innocent NP [facility]  Contact [phone] Monday through Friday 8am- 5pm  After hours call [phone]

## 2021-04-16 DIAGNOSIS — F411 Generalized anxiety disorder: Secondary | ICD-10-CM | POA: Diagnosis not present

## 2021-04-16 DIAGNOSIS — E559 Vitamin D deficiency, unspecified: Secondary | ICD-10-CM | POA: Insufficient documentation

## 2021-04-16 DIAGNOSIS — E538 Deficiency of other specified B group vitamins: Secondary | ICD-10-CM | POA: Insufficient documentation

## 2021-04-16 DIAGNOSIS — G3184 Mild cognitive impairment, so stated: Secondary | ICD-10-CM | POA: Diagnosis not present

## 2021-04-19 DIAGNOSIS — F331 Major depressive disorder, recurrent, moderate: Secondary | ICD-10-CM | POA: Diagnosis not present

## 2021-04-20 ENCOUNTER — Encounter: Payer: Self-pay | Admitting: Adult Health

## 2021-04-20 ENCOUNTER — Non-Acute Institutional Stay (SKILLED_NURSING_FACILITY): Payer: Medicare Other | Admitting: Adult Health

## 2021-04-20 DIAGNOSIS — I69351 Hemiplegia and hemiparesis following cerebral infarction affecting right dominant side: Secondary | ICD-10-CM | POA: Diagnosis not present

## 2021-04-20 DIAGNOSIS — F0151 Vascular dementia with behavioral disturbance: Secondary | ICD-10-CM | POA: Diagnosis not present

## 2021-04-20 DIAGNOSIS — F323 Major depressive disorder, single episode, severe with psychotic features: Secondary | ICD-10-CM | POA: Diagnosis not present

## 2021-04-20 DIAGNOSIS — F01518 Vascular dementia, unspecified severity, with other behavioral disturbance: Secondary | ICD-10-CM

## 2021-04-20 NOTE — Progress Notes (Signed)
Location:  [facility] Room Number: 123-P Place of Service:  SNF (31)   CODE STATUS: DNR  Allergies  Allergen Reactions   Contrast Media [Iodinated Diagnostic Agents] Anaphylaxis   Betamethasone Valerate Other (See Comments)    Chief Complaint  Patient presents with   Acute Visit    Care plan meeting    HPI:  We have come together for her care plan meeting.  BIMS 12/15 mood 5/30: feels down; trouble sleeping; feels tired; no appetite. She is extensive assist to dependent for her adl's. She is nonambulatory. She is incontinent of bladder and frequently incontinent of bowel. There have been no falls. Cbg readings are stable. Therapy none at this time.  Dietary weight is 164 pounds is stable regular diet;  she feeds herself.  She continues to be followed for her chronic illnesses including:  Vascular dementia with behavioral disturbance Hemiplegia of right dominant side as late effect of cerebral infarction unspecified hemiplegia type  Major depression with psychotic features  Past Medical History:  Diagnosis Date   Arthritis    Coronary artery disease    angina   Diabetes mellitus without complication (HCC)    Hypertension     Past Surgical History:  Procedure Laterality Date   APPENDECTOMY     CHOLECYSTECTOMY      Social History   Socioeconomic History   Marital status: Widowed    Spouse name: Not on file   Number of children: Not on file   Years of education: Not on file   Highest education level: Not on file  Occupational History   Not on file  Tobacco Use   Smoking status: Never   Smokeless tobacco: Never  Vaping Use   Vaping Use: Never used  Substance and Sexual Activity   Alcohol use: No   Drug use: No   Sexual activity: Not on file  Other Topics Concern   Not on file  Social History Narrative   Not on file   Social Determinants of Health   Financial Resource Strain: Not on file  Food Insecurity: Not on file  Transportation  Needs: Not on file  Physical Activity: Not on file  Stress: Not on file  Social Connections: Not on file  Intimate Partner Violence: Not on file   History reviewed. No pertinent family history.    VITAL SIGNS BP 136/69   Pulse 76   Temp 97.6 F (36.4 C)   Resp 18   Ht 5\' 4"  (1.626 m)   Wt 164 lb (74.4 kg)   SpO2 96%   BMI 28.15 kg/m   Outpatient Encounter Medications as of 04/20/2021  Medication Sig   acetaminophen (TYLENOL) 325 MG tablet Take 650 mg by mouth every 6 (six) hours as needed.    amLODipine (NORVASC) 10 MG tablet Take 1 tablet (10 mg total) by mouth daily.   atorvastatin (LIPITOR) 20 MG tablet Take 1 tablet (20 mg total) by mouth daily at 6 PM.   Balsam [place]-Castor Oil (VENELEX) OINT Apply topically. Apply to sacrum and bilateral buttocks qshift for prevention.   busPIRone (BUSPAR) 7.5 MG tablet Take 7.5 mg by mouth 2 (two) times daily. For Anxiety   Camphor-Menthol-Methyl Sal (SALONPAS) 3.07-27-08 % PTCH Apply topically. 1 patch, topical, Twice A Day, apply on Right foot,right forearm,and Left Shoulder and neck in the AM and remove in the PM for pain management   lisinopril (ZESTRIL) 10 MG tablet Take 10 mg by mouth daily.   LORazepam (ATIVAN)  0.5 MG tablet Take 0.5 tablets (0.25 mg total) by mouth every other  bedtime.   melatonin 3 MG TABS tablet Take 3 mg by mouth at bedtime.   metFORMIN (GLUCOPHAGE) 500 MG tablet Take 1 tablet by mouth 2 (two) times daily.   NON FORMULARY Diet Change: Regular, carbohydrate consistent with thin liquids   NON FORMULARY Accu-check qam. Notify provider of results under 60 or over 400. Once A Day   No facility-administered encounter medications on file as of 04/20/2021.     SIGNIFICANT DIAGNOSTIC EXAMS  PREVIOUS   02-03-20: DEXA t score -1.009   NO NEW EXAMS.   LABS REVIEWED PREVIOUS   SHE IS DECLINING BLOOD DRAWS   05-04-20: hgb a1c 6.4  02-02-21: d-dimer 1.80 04-11-21: wbc 8.7; hgb 14.2; hct 43.6; mcv 94.0 plt 217;  glucose 143; bun 16; creat 0.60; k+ 4.0; na++ 135; ca 9.0 GFR>60; hgb a1c 6.3; chol 115; ldl 43; trig 134; hdl 47; tsh 1.386 vit B 12: 261 vit D 29.23  NO NEW LABS.   Review of Systems  Constitutional:  Negative for malaise/fatigue.  Respiratory:  Negative for cough and shortness of breath.   Cardiovascular:  Negative for chest pain, palpitations and leg swelling.  Gastrointestinal:  Negative for abdominal pain, constipation and heartburn.  Musculoskeletal:  Positive for joint pain and myalgias. Negative for back pain.  Skin: Negative.   Neurological:  Negative for dizziness.  Psychiatric/Behavioral:  The patient is not nervous/anxious.     Physical Exam Constitutional:      General: She is not in acute distress.    Appearance: She is well-developed. She is not diaphoretic.  Neck:     Thyroid: No thyromegaly.  Cardiovascular:     Rate and Rhythm: Normal rate and regular rhythm.     Pulses: Normal pulses.     Heart sounds: Normal heart sounds.  Pulmonary:     Effort: Pulmonary effort is normal. No respiratory distress.     Breath sounds: Normal breath sounds.  Abdominal:     General: Bowel sounds are normal. There is no distension.     Palpations: Abdomen is soft.     Tenderness: There is no abdominal tenderness.  Musculoskeletal:     Cervical back: Neck supple.     Right lower leg: No edema.     Left lower leg: No edema.     Comments: Right hemiplegia with contractures.    Lymphadenopathy:     Cervical: No cervical adenopathy.  Skin:    General: Skin is warm and dry.  Neurological:     Mental Status: She is alert. Mental status is at baseline.  Psychiatric:        Mood and Affect: Mood normal.     ASSESSMENT/ PLAN:  TODAY  Vascular dementia with behavioral disturbance Hemiplegia of right dominant side as late effect of cerebral infarction unspecified hemiplegia type Major depression with psychotic features  Will continue current medications Will continue  current plan of care Will continue to monitor her status.   Time spent with patient 40 minutes: medications; care plan; dietary needs.    Synthia Innocent NP [facility]  Contact [phone] Monday through Friday 8am- 5pm  After hours call [phone]

## 2021-04-23 ENCOUNTER — Other Ambulatory Visit: Payer: Self-pay | Admitting: Adult Health

## 2021-04-23 MED ORDER — LORAZEPAM 0.5 MG PO TABS
0.2500 mg | ORAL_TABLET | ORAL | 0 refills | Status: DC
Start: 2021-04-23 — End: 2021-04-25

## 2021-04-25 ENCOUNTER — Encounter: Payer: Self-pay | Admitting: Adult Health

## 2021-04-25 ENCOUNTER — Non-Acute Institutional Stay (SKILLED_NURSING_FACILITY): Payer: Medicare Other | Admitting: Adult Health

## 2021-04-25 ENCOUNTER — Other Ambulatory Visit: Payer: Self-pay | Admitting: Adult Health

## 2021-04-25 DIAGNOSIS — F323 Major depressive disorder, single episode, severe with psychotic features: Secondary | ICD-10-CM | POA: Diagnosis not present

## 2021-04-25 MED ORDER — LORAZEPAM 0.5 MG PO TABS: 0.2500 mg | ORAL_TABLET | Freq: Every day | ORAL | 0 refills | Status: DC

## 2021-04-25 NOTE — Progress Notes (Signed)
Location:  [facility] Room Number: 123-P Place of Service:  SNF (31)   CODE STATUS: DNR  Allergies  Allergen Reactions   Contrast Media [Iodinated Diagnostic Agents] Anaphylaxis   Betamethasone Valerate Other (See Comments)    Chief Complaint  Patient presents with   Acute Visit    Anxiety     HPI:  She has chronic anxiety. She has been on long term ativan. We have been attempting gradual dose reduction and she is presently on 0.25 mg every other night. She is more anxious; obsessing over her body; her health; things that have happened months ago. The staff report that her anxiety is not adequately managed.   Past Medical History:  Diagnosis Date   Arthritis    Coronary artery disease    angina   Diabetes mellitus without complication (HCC)    Hypertension     Past Surgical History:  Procedure Laterality Date   APPENDECTOMY     CHOLECYSTECTOMY      Social History   Socioeconomic History   Marital status: Widowed    Spouse name: Not on file   Number of children: Not on file   Years of education: Not on file   Highest education level: Not on file  Occupational History   Not on file  Tobacco Use   Smoking status: Never   Smokeless tobacco: Never  Vaping Use   Vaping Use: Never used  Substance and Sexual Activity   Alcohol use: No   Drug use: No   Sexual activity: Not on file  Other Topics Concern   Not on file  Social History Narrative   Not on file   Social Determinants of Health   Financial Resource Strain: Not on file  Food Insecurity: Not on file  Transportation Needs: Not on file  Physical Activity: Not on file  Stress: Not on file  Social Connections: Not on file  Intimate Partner Violence: Not on file   History reviewed. No pertinent family history.    VITAL SIGNS BP 118/63   Pulse 62   Temp 97.8 F (36.6 C)   Resp 18   Ht 5\' 4"  (1.626 m)   Wt 164 lb (74.4 kg)   BMI 28.15 kg/m   Outpatient Encounter  Medications as of 04/25/2021  Medication Sig   acetaminophen (TYLENOL) 325 MG tablet Take 650 mg by mouth every 6 (six) hours as needed.    amLODipine (NORVASC) 10 MG tablet Take 1 tablet (10 mg total) by mouth daily.   atorvastatin (LIPITOR) 20 MG tablet Take 1 tablet (20 mg total) by mouth daily at 6 PM.   Balsam Peru-Castor Oil (VENELEX) OINT Apply topically. Apply to sacrum and bilateral buttocks qshift for prevention.   busPIRone (BUSPAR) 7.5 MG tablet Take 7.5 mg by mouth 2 (two) times daily. For Anxiety   Camphor-Menthol-Methyl Sal (SALONPAS) 3.07-27-08 % PTCH Apply topically. 1 patch, topical, Twice A Day, apply on Right foot,right forearm,and Left Shoulder and neck in the AM and remove in the PM for pain management   lisinopril (ZESTRIL) 10 MG tablet Take 10 mg by mouth daily.   LORazepam (ATIVAN) 0.5 MG tablet Take 0.5 tablets (0.25 mg total) by mouth at bedtime. Give at bedtime   melatonin 3 MG TABS tablet Take 3 mg by mouth at bedtime.   metFORMIN (GLUCOPHAGE) 500 MG tablet Take 1 tablet by mouth 2 (two) times daily.   NON FORMULARY Diet Change: Regular, carbohydrate consistent with thin liquids   [  DISCONTINUED] NON FORMULARY Accu-check qam. Notify provider of results under 60 or over 400. Once A Day   No facility-administered encounter medications on file as of 04/25/2021.     SIGNIFICANT DIAGNOSTIC EXAMS  PREVIOUS   02-03-20: DEXA t score -1.009   NO NEW EXAMS.   LABS REVIEWED PREVIOUS   SHE IS DECLINING BLOOD DRAWS   05-04-20: hgb a1c 6.4  02-02-21: d-dimer 1.80 04-11-21: wbc 8.7; hgb 14.2; hct 43.6; mcv 94.0 plt 217; glucose 143; bun 16; creat 0.60; k+ 4.0; na++ 135; ca 9.0 GFR>60; hgb a1c 6.3; chol 115; ldl 43; trig 134; hdl 47; tsh 1.386 vit B 12: 261 vit D 29.23  NO NEW LABS.   Review of Systems  Constitutional:  Negative for malaise/fatigue.  Respiratory:  Negative for cough and shortness of breath.   Cardiovascular:  Negative for chest pain, palpitations and leg  swelling.  Gastrointestinal:  Negative for abdominal pain, constipation and heartburn.  Musculoskeletal:  Positive for joint pain. Negative for back pain and myalgias.  Skin: Negative.   Neurological:  Negative for dizziness.  Psychiatric/Behavioral:  The patient is nervous/anxious.    Physical Exam Constitutional:      General: She is not in acute distress.    Appearance: She is well-developed. She is not diaphoretic.  Neck:     Thyroid: No thyromegaly.  Cardiovascular:     Rate and Rhythm: Normal rate and regular rhythm.     Pulses: Normal pulses.     Heart sounds: Normal heart sounds.  Pulmonary:     Effort: Pulmonary effort is normal. No respiratory distress.     Breath sounds: Normal breath sounds.  Abdominal:     General: Bowel sounds are normal. There is no distension.     Palpations: Abdomen is soft.     Tenderness: There is no abdominal tenderness.  Musculoskeletal:     Cervical back: Neck supple.     Right lower leg: No edema.     Left lower leg: No edema.     Comments: Right hemiplegia with contractures.    Lymphadenopathy:     Cervical: No cervical adenopathy.  Skin:    General: Skin is warm and dry.  Neurological:     Mental Status: She is alert. Mental status is at baseline.  Psychiatric:        Mood and Affect: Mood normal.      ASSESSMENT/ PLAN:  TODAY  Major depression with psychotic features: is worse; will increase ativan to 0.25 mg nightly and will monitor her status.    Synthia Innocent NP [facility]  Contact [phone] Monday through Friday 8am- 5pm  After hours call [phone]

## 2021-04-26 DIAGNOSIS — M19041 Primary osteoarthritis, right hand: Secondary | ICD-10-CM | POA: Diagnosis not present

## 2021-04-30 DIAGNOSIS — M159 Polyosteoarthritis, unspecified: Secondary | ICD-10-CM | POA: Diagnosis not present

## 2021-04-30 DIAGNOSIS — I69351 Hemiplegia and hemiparesis following cerebral infarction affecting right dominant side: Secondary | ICD-10-CM | POA: Diagnosis not present

## 2021-04-30 DIAGNOSIS — M24541 Contracture, right hand: Secondary | ICD-10-CM | POA: Diagnosis not present

## 2021-04-30 DIAGNOSIS — M79641 Pain in right hand: Secondary | ICD-10-CM | POA: Diagnosis not present

## 2021-05-01 ENCOUNTER — Non-Acute Institutional Stay (SKILLED_NURSING_FACILITY): Payer: Medicare Other | Admitting: Internal Medicine

## 2021-05-01 ENCOUNTER — Encounter: Payer: Self-pay | Admitting: Internal Medicine

## 2021-05-01 DIAGNOSIS — I69398 Other sequelae of cerebral infarction: Secondary | ICD-10-CM | POA: Diagnosis not present

## 2021-05-01 DIAGNOSIS — E1151 Type 2 diabetes mellitus with diabetic peripheral angiopathy without gangrene: Secondary | ICD-10-CM

## 2021-05-01 DIAGNOSIS — F0630 Mood disorder due to known physiological condition, unspecified: Secondary | ICD-10-CM | POA: Diagnosis not present

## 2021-05-01 DIAGNOSIS — E559 Vitamin D deficiency, unspecified: Secondary | ICD-10-CM | POA: Diagnosis not present

## 2021-05-01 DIAGNOSIS — E1169 Type 2 diabetes mellitus with other specified complication: Secondary | ICD-10-CM

## 2021-05-01 DIAGNOSIS — E785 Hyperlipidemia, unspecified: Secondary | ICD-10-CM

## 2021-05-01 DIAGNOSIS — E538 Deficiency of other specified B group vitamins: Secondary | ICD-10-CM

## 2021-05-01 DIAGNOSIS — I69351 Hemiplegia and hemiparesis following cerebral infarction affecting right dominant side: Secondary | ICD-10-CM | POA: Diagnosis not present

## 2021-05-01 NOTE — Patient Instructions (Signed)
See assessment and plan under each diagnosis in the problem list and acutely for this visit 

## 2021-05-01 NOTE — Assessment & Plan Note (Signed)
LDL of 43 is @ goal of < 70; no change

## 2021-05-01 NOTE — Progress Notes (Signed)
NURSING HOME LOCATION:  [facility] ROOM NUMBER:  123 P  CODE STATUS:  DNR  PCP: Synthia Innocent NP  This is a nursing facility follow up visit  for medical management of chronic issues & to document compliance with Regulation 483.30 (c) in The Long Term Care Survey Manual Phase 2 which mandates caregiver visit ( visits can alternate among physician, PA or NP as per statutes) within 10 days of 30 days / 60 days/ 90 days post admission to SNF date    Interim medical record and care since last SNF visit was updated with review of diagnostic studies and change in clinical status since last visit were documented.  HPI:She is a permanent resident with diagnoses of CAD, dyslipidemia,HTN & DM with PVD.. Labs are current as of 9/21.  Albumin was slightly reduced at 3.4 but total protein was normal at 7.1.  Creatinine was 0.16 with a GFR greater than 60 indicating stage II CKD.  LDL was at goal at 43.  B12 level was low therapeutic but vitamin D level was slightly low at 29.23.  Review of systems: Her major complaint is pain in the right hand, right knee, right foot, posterior neck.  She is under the misconception that this was related to some manipulation or intervention here at the SNF rather than her stroke.  She states that she is very nervous and has intermittent heart racing.  She also describes insomnia.  There is numbness in the right foot.  She states that Tylenol helps the pain symptoms to some extent.  She inquired as to what could be done to treat the contractures and pain in the right hand. Staff was consulted about her blood pressure.  Today's systolic of 159 is an outlier.  Staff states that she often will not take her medications except for the Ativan which she request on a regular basis. The DON notified me she has been sequestering some meds, mainly the Tylenol.  Constitutional: No fever, significant weight change  Eyes: No redness, discharge, pain, vision  change ENT/mouth: No nasal congestion,  purulent discharge, earache, change in hearing, sore throat  Cardiovascular: No chest pain, paroxysmal nocturnal dyspnea Respiratory: No cough, sputum production, hemoptysis   Gastrointestinal: No heartburn, dysphagia, abdominal pain, nausea /vomiting, rectal bleeding, melena, change in bowels Genitourinary: No dysuria, hematuria, pyuria Dermatologic: No rash, pruritus, change in appearance of skin Neurologic: No dizziness, headache, syncope, seizures Psychiatric: No anorexia Endocrine: No change in hair/skin/nails, excessive thirst, excessive hunger, excessive urination  Hematologic/lymphatic: No significant bruising, lymphadenopathy, abnormal bleeding Allergy/immunology: No itchy/watery eyes, significant sneezing, urticaria, angioedema  Physical exam:  Pertinent or positive findings: There is pattern alopecia, greatest over the crown.  The mouth sags on the right.  She is wearing the upper plate only.  The mandible is edentulous.  Her heart rhythm and rate are slow and regular with no dysrhythmias or tachycardia.  Abdomen is protuberant.  Pedal pulses are surprisingly good, especially the dorsalis pedis pulses.  She has trace-1/2+ edema over the feet.  The toenails of the great toes are absent.  The right upper extremity is flexed across the upper abdomen.  There is marked flexion contractures of the fingers of the right hand.  The fifth digit overlaps the fourth and the fourth overlaps the third.  The index finger overlaps the thumb which is flexed across the palm.  The left upper and lower extremities are stronger than the right.  There is no range of motion of  the right upper extremity.  General appearance: no acute distress, increased work of breathing is present.   Lymphatic: No lymphadenopathy about the head, neck, axilla. Eyes: No conjunctival inflammation or lid edema is present. There is no scleral icterus. Ears:  External ear exam shows no  significant lesions or deformities.   Nose:  External nasal examination shows no deformity or inflammation. Nasal mucosa are pink and moist without lesions, exudates Neck:  No thyromegaly, masses, tenderness noted.    Heart:  Normal rate and regular rhythm. S1 and S2 normal without gallop, murmur, click, rub .  Lungs: Chest clear to auscultation without wheezes, rhonchi, rales, rubs. Abdomen: Bowel sounds are normal. Abdomen is soft and nontender with no organomegaly, hernias, masses. GU: Deferred  Extremities:  No cyanosis, clubbing  Neurologic exam :Balance, Rhomberg, finger to nose testing could not be completed due to clinical state Skin: Warm & dry w/o tenting. No significant lesions or rash.  See summary under each active problem in the Problem List with associated updated therapeutic plan

## 2021-05-01 NOTE — Assessment & Plan Note (Addendum)
She is in denial as to the etiology of pain in the stroke affected right upper and lower extremities, inferring at this was related to some staff inflicted injury.She is sequestering some meds & intermittently refusing others. She has refused to see Psychiatrist.

## 2021-05-01 NOTE — Assessment & Plan Note (Signed)
Current A1c 6.3%; goal = < 8%

## 2021-05-01 NOTE — Assessment & Plan Note (Addendum)
She complains of pain in the stroke affected right upper and right lower extremities.  I will discuss a trial of gabapentin with the NP. Patient compliance with trial doubtful according to staff.

## 2021-05-01 NOTE — Assessment & Plan Note (Addendum)
Current B12 level low therapeutic. The RU &RLE complaints relate to prior CVA, not B12 deficiency.

## 2021-05-01 NOTE — Assessment & Plan Note (Signed)
Current vit D 29.23; verify supplementation w NP

## 2021-05-02 DIAGNOSIS — M159 Polyosteoarthritis, unspecified: Secondary | ICD-10-CM | POA: Diagnosis not present

## 2021-05-02 DIAGNOSIS — M79641 Pain in right hand: Secondary | ICD-10-CM | POA: Diagnosis not present

## 2021-05-02 DIAGNOSIS — I69351 Hemiplegia and hemiparesis following cerebral infarction affecting right dominant side: Secondary | ICD-10-CM | POA: Diagnosis not present

## 2021-05-02 DIAGNOSIS — M24541 Contracture, right hand: Secondary | ICD-10-CM | POA: Diagnosis not present

## 2021-05-03 DIAGNOSIS — M24541 Contracture, right hand: Secondary | ICD-10-CM | POA: Diagnosis not present

## 2021-05-03 DIAGNOSIS — M159 Polyosteoarthritis, unspecified: Secondary | ICD-10-CM | POA: Diagnosis not present

## 2021-05-03 DIAGNOSIS — F331 Major depressive disorder, recurrent, moderate: Secondary | ICD-10-CM | POA: Diagnosis not present

## 2021-05-03 DIAGNOSIS — M79641 Pain in right hand: Secondary | ICD-10-CM | POA: Diagnosis not present

## 2021-05-03 DIAGNOSIS — I69351 Hemiplegia and hemiparesis following cerebral infarction affecting right dominant side: Secondary | ICD-10-CM | POA: Diagnosis not present

## 2021-05-04 DIAGNOSIS — M24541 Contracture, right hand: Secondary | ICD-10-CM | POA: Diagnosis not present

## 2021-05-04 DIAGNOSIS — I69351 Hemiplegia and hemiparesis following cerebral infarction affecting right dominant side: Secondary | ICD-10-CM | POA: Diagnosis not present

## 2021-05-04 DIAGNOSIS — M79641 Pain in right hand: Secondary | ICD-10-CM | POA: Diagnosis not present

## 2021-05-04 DIAGNOSIS — M159 Polyosteoarthritis, unspecified: Secondary | ICD-10-CM | POA: Diagnosis not present

## 2021-05-07 DIAGNOSIS — M24541 Contracture, right hand: Secondary | ICD-10-CM | POA: Diagnosis not present

## 2021-05-07 DIAGNOSIS — M79641 Pain in right hand: Secondary | ICD-10-CM | POA: Diagnosis not present

## 2021-05-07 DIAGNOSIS — I69351 Hemiplegia and hemiparesis following cerebral infarction affecting right dominant side: Secondary | ICD-10-CM | POA: Diagnosis not present

## 2021-05-07 DIAGNOSIS — M159 Polyosteoarthritis, unspecified: Secondary | ICD-10-CM | POA: Diagnosis not present

## 2021-05-16 DIAGNOSIS — Z1159 Encounter for screening for other viral diseases: Secondary | ICD-10-CM | POA: Diagnosis not present

## 2021-05-16 DIAGNOSIS — I69351 Hemiplegia and hemiparesis following cerebral infarction affecting right dominant side: Secondary | ICD-10-CM | POA: Diagnosis not present

## 2021-05-16 DIAGNOSIS — I610 Nontraumatic intracerebral hemorrhage in hemisphere, subcortical: Secondary | ICD-10-CM | POA: Diagnosis not present

## 2021-05-22 ENCOUNTER — Other Ambulatory Visit: Payer: Self-pay | Admitting: Adult Health

## 2021-05-22 MED ORDER — LORAZEPAM 0.5 MG PO TABS: 0.2500 mg | ORAL_TABLET | Freq: Every day | ORAL | 0 refills | Status: DC

## 2021-05-23 DIAGNOSIS — F331 Major depressive disorder, recurrent, moderate: Secondary | ICD-10-CM | POA: Diagnosis not present

## 2021-06-05 ENCOUNTER — Encounter: Payer: Self-pay | Admitting: Adult Health

## 2021-06-05 ENCOUNTER — Non-Acute Institutional Stay (SKILLED_NURSING_FACILITY): Payer: Medicare Other | Admitting: Adult Health

## 2021-06-05 DIAGNOSIS — E785 Hyperlipidemia, unspecified: Secondary | ICD-10-CM

## 2021-06-05 DIAGNOSIS — I152 Hypertension secondary to endocrine disorders: Secondary | ICD-10-CM | POA: Diagnosis not present

## 2021-06-05 DIAGNOSIS — E1169 Type 2 diabetes mellitus with other specified complication: Secondary | ICD-10-CM

## 2021-06-05 DIAGNOSIS — E1159 Type 2 diabetes mellitus with other circulatory complications: Secondary | ICD-10-CM

## 2021-06-05 DIAGNOSIS — K5909 Other constipation: Secondary | ICD-10-CM | POA: Diagnosis not present

## 2021-06-05 NOTE — Progress Notes (Signed)
Location:  [facility] Room Number: 123-P Place of Service:  SNF (31)   CODE STATUS: DNR  Allergies  Allergen Reactions   Contrast Media [Iodinated Diagnostic Agents] Anaphylaxis   Betamethasone Valerate Other (See Comments)    Chief Complaint  Patient presents with   Medical Management of Chronic Issues                      Dyslipidemia associated with type 2 diabetes mellitus:  Chronic constipation: Hypertension associated with type 2 diabetes mellitus    HPI:  She is a 87 year old long term resident of this facility being seen for the management of her chronic illnesses: Dyslipidemia associated with type 2 diabetes mellitus:  Chronic constipation: Hypertension associated with type 2 diabetes mellitus. There are no reports of uncontrolled pain; no changes in appetite; weight is stable.   Past Medical History:  Diagnosis Date   Arthritis    Coronary artery disease    angina   Diabetes mellitus without complication (HCC)    Hypertension     Past Surgical History:  Procedure Laterality Date   APPENDECTOMY     CHOLECYSTECTOMY      Social History   Socioeconomic History   Marital status: Widowed    Spouse name: Not on file   Number of children: Not on file   Years of education: Not on file   Highest education level: Not on file  Occupational History   Not on file  Tobacco Use   Smoking status: Never   Smokeless tobacco: Never  Vaping Use   Vaping Use: Never used  Substance and Sexual Activity   Alcohol use: No   Drug use: No   Sexual activity: Not on file  Other Topics Concern   Not on file  Social History Narrative   Not on file   Social Determinants of Health   Financial Resource Strain: Not on file  Food Insecurity: Not on file  Transportation Needs: Not on file  Physical Activity: Not on file  Stress: Not on file  Social Connections: Not on file  Intimate Partner Violence: Not on file   History reviewed. No pertinent  family history.    VITAL SIGNS BP 118/68   Pulse 65   Temp (!) 97.5 F (36.4 C)   Resp 18   Ht 5\' 4"  (1.626 m)   Wt 164 lb (74.4 kg)   SpO2 96%   BMI 28.15 kg/m   Outpatient Encounter Medications as of 06/05/2021  Medication Sig   acetaminophen (TYLENOL) 325 MG tablet Take 650 mg by mouth every 6 (six) hours as needed.    amLODipine (NORVASC) 10 MG tablet Take 1 tablet (10 mg total) by mouth daily.   atorvastatin (LIPITOR) 20 MG tablet Take 1 tablet (20 mg total) by mouth daily at 6 PM.   Balsam [place]-Castor Oil (VENELEX) OINT Apply topically. Apply to sacrum and bilateral buttocks qshift for prevention.   busPIRone (BUSPAR) 7.5 MG tablet Take 7.5 mg by mouth 2 (two) times daily. For Anxiety   Camphor-Menthol-Methyl Sal (SALONPAS) 3.07-27-08 % PTCH Apply topically. 1 patch, topical, Twice A Day, apply on Right foot,right forearm,and Left Shoulder and neck in the AM and remove in the PM for pain management   lisinopril (ZESTRIL) 10 MG tablet Take 10 mg by mouth daily.   LORazepam (ATIVAN) 0.5 MG tablet Take 0.5 tablets (0.25 mg total) by mouth at bedtime. Give at bedtime   melatonin 3  MG TABS tablet Take 3 mg by mouth at bedtime.   metFORMIN (GLUCOPHAGE) 500 MG tablet Take 1 tablet by mouth 2 (two) times daily.   NON FORMULARY Diet Change: Regular, carbohydrate consistent with thin liquids   triamcinolone cream (KENALOG) 0.1 % Apply 1 application topically 2 (two) times daily as needed (Apply small amount under left breast).   No facility-administered encounter medications on file as of 06/05/2021.     SIGNIFICANT DIAGNOSTIC EXAMS  PREVIOUS   02-03-20: DEXA t score -1.009   NO NEW EXAMS.   LABS REVIEWED PREVIOUS   SHE will allow one blood draw per year.  02-02-21: d-dimer 1.80 04-11-21: wbc 8.7; hgb 14.2; hct 43.6; mcv 94.0 plt 217; glucose 143; bun 16; creat 0.60; k+ 4.0; na++ 135; ca 9.0 GFR>60; hgb a1c 6.3; chol 115; ldl 43; trig 134; hdl 47; tsh 1.386 vit B 12: 261 vit D  29.23  NO NEW LABS.   Review of Systems  Constitutional:  Negative for malaise/fatigue.  Respiratory:  Negative for cough and shortness of breath.   Cardiovascular:  Negative for chest pain, palpitations and leg swelling.  Gastrointestinal:  Negative for abdominal pain, constipation and heartburn.  Musculoskeletal:  Positive for joint pain. Negative for back pain and myalgias.  Skin: Negative.   Neurological:  Negative for dizziness.  Psychiatric/Behavioral:  The patient is not nervous/anxious.    Physical Exam Constitutional:      General: She is not in acute distress.    Appearance: She is well-developed. She is not diaphoretic.  Neck:     Thyroid: No thyromegaly.  Cardiovascular:     Rate and Rhythm: Normal rate and regular rhythm.     Pulses: Normal pulses.     Heart sounds: Normal heart sounds.  Pulmonary:     Effort: Pulmonary effort is normal. No respiratory distress.     Breath sounds: Normal breath sounds.  Abdominal:     General: Bowel sounds are normal. There is no distension.     Palpations: Abdomen is soft.     Tenderness: There is no abdominal tenderness.  Musculoskeletal:     Cervical back: Neck supple.     Right lower leg: No edema.     Left lower leg: No edema.     Comments:  Right hemiplegia with contractures.   Lymphadenopathy:     Cervical: No cervical adenopathy.  Skin:    General: Skin is warm and dry.  Neurological:     Mental Status: She is alert. Mental status is at baseline.  Psychiatric:        Mood and Affect: Mood normal.      ASSESSMENT/ PLAN:  TODAY  Dyslipidemia associated with type 2 diabetes mellitus: is stable LDL 43 will continue lipitor 20 mg daily   2. Chronic constipation: is stable will continue senna s twice daily   3. Hypertension associated with type 2 diabetes mellitus: is stable b/p 118/68 will continue lisinopril 10 mg daily and norvasc 10 mg daily    PREVIOUS  4. Type 2 diabetes mellitus with peripheral artery  disease is stable hgb a1c 6.3; will continue metformin 500 mg twice daily is on ace statin.   5. Major depression with psychotic features: is without change: will continue ativan 0.25 mg nightly ( has failed one wean) buspar 7.5 mg twice daily stopped zoloft due to her request; she will not allow for any alterations in her medications  6. IVH (intraventricular hemorrhage)/ hemiplegia right dominant side as late effect  of cerebral infarction unspecified hemiplegia type: is without change will monitor  7. Dysphagia due to CVA: is without signs of aspiration present is on thin liquids.      Synthia Innocent NP [facility]  Contact [phone] Monday through Friday 8am- 5pm  After hours call [phone]

## 2021-06-12 DIAGNOSIS — G3184 Mild cognitive impairment, so stated: Secondary | ICD-10-CM | POA: Diagnosis not present

## 2021-06-12 DIAGNOSIS — F411 Generalized anxiety disorder: Secondary | ICD-10-CM | POA: Diagnosis not present

## 2021-06-21 ENCOUNTER — Other Ambulatory Visit: Payer: Self-pay | Admitting: Adult Health

## 2021-06-21 MED ORDER — LORAZEPAM 0.5 MG PO TABS: 0.2500 mg | ORAL_TABLET | Freq: Every day | ORAL | 0 refills | Status: DC

## 2021-07-04 ENCOUNTER — Non-Acute Institutional Stay (SKILLED_NURSING_FACILITY): Payer: Medicare Other | Admitting: Adult Health

## 2021-07-04 ENCOUNTER — Encounter: Payer: Self-pay | Admitting: Adult Health

## 2021-07-04 DIAGNOSIS — E1151 Type 2 diabetes mellitus with diabetic peripheral angiopathy without gangrene: Secondary | ICD-10-CM

## 2021-07-04 DIAGNOSIS — I69351 Hemiplegia and hemiparesis following cerebral infarction affecting right dominant side: Secondary | ICD-10-CM

## 2021-07-04 DIAGNOSIS — I615 Nontraumatic intracerebral hemorrhage, intraventricular: Secondary | ICD-10-CM | POA: Diagnosis not present

## 2021-07-04 DIAGNOSIS — F323 Major depressive disorder, single episode, severe with psychotic features: Secondary | ICD-10-CM | POA: Diagnosis not present

## 2021-07-04 NOTE — Progress Notes (Signed)
Location:  [facility] Room Number: 123-P Place of Service:  SNF (31)   CODE STATUS: DNR  Allergies  Allergen Reactions   Contrast Media [Iodinated Diagnostic Agents] Anaphylaxis   Betamethasone Valerate Other (See Comments)    Chief Complaint  Patient presents with   Medical Management of Chronic Issues              Type 2 diabetes mellitus with peripheral artery disease;    Major depression with psychotic features:  IVH (intraventricular hemorrhage) / hemiplegia right dominant side as late effect of cerebral infarction unspecified hemiplegia type    HPI:  She is a 87 year old long term resident of this facility being seen for the management of her chronic illnesses:  Type 2 diabetes mellitus with peripheral artery disease;    Major depression with psychotic features:  IVH (intraventricular hemorrhage) / hemiplegia right dominant side as late effect of cerebral infarction unspecified hemiplegia type. There are no reports of uncontrolled pain. Her daughter continues to bring in patches and other OTC products. She has been instructed to stop this; as Keyana could be injured.   Past Medical History:  Diagnosis Date   Arthritis    Coronary artery disease    angina   Diabetes mellitus without complication (HCC)    Hypertension     Past Surgical History:  Procedure Laterality Date   APPENDECTOMY     CHOLECYSTECTOMY      Social History   Socioeconomic History   Marital status: Widowed    Spouse name: Not on file   Number of children: Not on file   Years of education: Not on file   Highest education level: Not on file  Occupational History   Not on file  Tobacco Use   Smoking status: Never   Smokeless tobacco: Never  Vaping Use   Vaping Use: Never used  Substance and Sexual Activity   Alcohol use: No   Drug use: No   Sexual activity: Not on file  Other Topics Concern   Not on file  Social History Narrative   Not on file   Social  Determinants of Health   Financial Resource Strain: Not on file  Food Insecurity: Not on file  Transportation Needs: Not on file  Physical Activity: Not on file  Stress: Not on file  Social Connections: Not on file  Intimate Partner Violence: Not on file   History reviewed. No pertinent family history.    VITAL SIGNS BP (!) 119/51    Pulse (!) 57    Temp 97.6 F (36.4 C)    Resp 18    Ht 5\' 4"  (1.626 m)    Wt 164 lb (74.4 kg)    SpO2 96%    BMI 28.15 kg/m   Outpatient Encounter Medications as of 07/04/2021  Medication Sig   acetaminophen (TYLENOL) 325 MG tablet Take 650 mg by mouth every 6 (six) hours as needed.    amLODipine (NORVASC) 10 MG tablet Take 1 tablet (10 mg total) by mouth daily.   atorvastatin (LIPITOR) 20 MG tablet Take 1 tablet (20 mg total) by mouth daily at 6 PM.   Balsam [place]-Castor Oil (VENELEX) OINT Apply topically. Apply to sacrum and bilateral buttocks qshift for prevention.   busPIRone (BUSPAR) 7.5 MG tablet Take 7.5 mg by mouth 2 (two) times daily. For Anxiety   Camphor-Menthol-Methyl Sal (SALONPAS) 3.07-27-08 % PTCH Apply topically. 1 patch, topical, Twice A Day, apply on Right foot,right forearm,and Left  Shoulder and neck in the AM and remove in the PM for pain management   lisinopril (ZESTRIL) 10 MG tablet Take 10 mg by mouth daily.   LORazepam (ATIVAN) 0.5 MG tablet Take 0.5 tablets (0.25 mg total) by mouth at bedtime. Give at bedtime   melatonin 3 MG TABS tablet Take 3 mg by mouth at bedtime.   metFORMIN (GLUCOPHAGE) 500 MG tablet Take 1 tablet by mouth 2 (two) times daily.   NON FORMULARY Diet Change: Regular, carbohydrate consistent with thin liquids   No facility-administered encounter medications on file as of 07/04/2021.     SIGNIFICANT DIAGNOSTIC EXAMS  PREVIOUS   02-03-20: DEXA t score -1.009   NO NEW EXAMS.   LABS REVIEWED PREVIOUS   SHE will allow one blood draw per year.  02-02-21: d-dimer 1.80 04-11-21: wbc 8.7; hgb 14.2; hct 43.6;  mcv 94.0 plt 217; glucose 143; bun 16; creat 0.60; k+ 4.0; na++ 135; ca 9.0 GFR>60; hgb a1c 6.3; chol 115; ldl 43; trig 134; hdl 47; tsh 1.386 vit B 12: 261 vit D 29.23  NO NEW LABS.   Review of Systems  Constitutional:  Negative for malaise/fatigue.  Respiratory:  Negative for cough and shortness of breath.   Cardiovascular:  Negative for chest pain, palpitations and leg swelling.  Gastrointestinal:  Negative for abdominal pain, constipation and heartburn.  Musculoskeletal:  Negative for back pain, joint pain and myalgias.  Skin: Negative.   Neurological:  Negative for dizziness.  Psychiatric/Behavioral:  The patient is not nervous/anxious.     Physical Exam Constitutional:      General: She is not in acute distress.    Appearance: She is well-developed. She is not diaphoretic.  Neck:     Thyroid: No thyromegaly.  Cardiovascular:     Rate and Rhythm: Normal rate and regular rhythm.     Pulses: Normal pulses.     Heart sounds: Normal heart sounds.  Pulmonary:     Effort: Pulmonary effort is normal. No respiratory distress.     Breath sounds: Normal breath sounds.  Abdominal:     General: Bowel sounds are normal. There is no distension.     Palpations: Abdomen is soft.     Tenderness: There is no abdominal tenderness.  Musculoskeletal:     Cervical back: Neck supple.     Right lower leg: No edema.     Left lower leg: No edema.     Comments: Right hemiplegia with contractures.    Lymphadenopathy:     Cervical: No cervical adenopathy.  Skin:    General: Skin is warm and dry.  Neurological:     Mental Status: She is alert. Mental status is at baseline.  Psychiatric:        Mood and Affect: Mood normal.    ASSESSMENT/ PLAN:  TODAY  Type 2 diabetes mellitus with peripheral artery disease; is stable hgb a1c 6.3 will continue metformin 500 mg twice daily is on ace and statin  2. Major depression with psychotic features: is without change will continue ativan 0.25 mg  nightly (has failed one wean); buspar 7.5 mg twice daily is off zoloft per her request; will not take other medications.   3. IVH (intraventricular hemorrhage) / hemiplegia right dominant side as late effect of cerebral infarction unspecified hemiplegia type: is without change will monitor   PREVIOUS  4. Dysphagia due to CVA: is without signs of aspiration present is on thin liquids.   5. Dyslipidemia associated with type 2 diabetes mellitus:  is stable LDL 43 will continue lipitor 20 mg daily   6. Chronic constipation: is stable will continue senna s twice daily   7. Hypertension associated with type 2 diabetes mellitus: is stable b/p 119/51 will continue lisinopril 10 mg daily and norvasc 10 mg daily      Synthia Innocent NP [facility]  Contact [phone] Monday through Friday 8am- 5pm  After hours call [phone]

## 2021-07-10 DIAGNOSIS — G3184 Mild cognitive impairment, so stated: Secondary | ICD-10-CM | POA: Diagnosis not present

## 2021-07-10 DIAGNOSIS — F411 Generalized anxiety disorder: Secondary | ICD-10-CM | POA: Diagnosis not present

## 2021-07-20 ENCOUNTER — Non-Acute Institutional Stay (SKILLED_NURSING_FACILITY): Payer: Medicare Other | Admitting: Adult Health

## 2021-07-20 ENCOUNTER — Encounter: Payer: Self-pay | Admitting: Adult Health

## 2021-07-20 DIAGNOSIS — I69351 Hemiplegia and hemiparesis following cerebral infarction affecting right dominant side: Secondary | ICD-10-CM | POA: Diagnosis not present

## 2021-07-20 DIAGNOSIS — E1151 Type 2 diabetes mellitus with diabetic peripheral angiopathy without gangrene: Secondary | ICD-10-CM | POA: Diagnosis not present

## 2021-07-20 DIAGNOSIS — F323 Major depressive disorder, single episode, severe with psychotic features: Secondary | ICD-10-CM | POA: Diagnosis not present

## 2021-07-20 NOTE — Progress Notes (Signed)
Location:  [facility] Room Number: 123-P Place of Service:  SNF (31)   CODE STATUS: DNR  Allergies  Allergen Reactions   Contrast Media [Iodinated Contrast Media] Anaphylaxis   Betamethasone Valerate Other (See Comments)    Chief Complaint  Patient presents with   Acute Visit    Care plan meeting    HPI:  We have come together for her care plan meeting. BIMS 15/15 mood 4/30: nervous some depression. She is extensive assist to dependent for her adl care. She is incontinent of bladder and bowel. She is nonambulatory there have no falls. Does have a right hand/wrist splint. She is able to feed herself; her weight is 164 pounds and is stable is on carb concentrate diet has a fair appetite. She has been declining her salon pas: will stop these. She will decline personal care at times. Her CBG readings have been stable. Therapy: none at this time  She continues to be followed for her chronic illnesses including:  Diabetes type 2 with peripheral artery disease  Hemiplegia of right dominant side as late effect of cerebral infarction unspecified hemiplegia type  Major depression with psychotic features  Past Medical History:  Diagnosis Date   Arthritis    Coronary artery disease    angina   Diabetes mellitus without complication (HCC)    Hypertension     Past Surgical History:  Procedure Laterality Date   APPENDECTOMY     CHOLECYSTECTOMY      Social History   Socioeconomic History   Marital status: Widowed    Spouse name: Not on file   Number of children: Not on file   Years of education: Not on file   Highest education level: Not on file  Occupational History   Not on file  Tobacco Use   Smoking status: Never   Smokeless tobacco: Never  Vaping Use   Vaping Use: Never used  Substance and Sexual Activity   Alcohol use: No   Drug use: No   Sexual activity: Not on file  Other Topics Concern   Not on file  Social History Narrative   Not on file    Social Determinants of Health   Financial Resource Strain: Not on file  Food Insecurity: Not on file  Transportation Needs: Not on file  Physical Activity: Not on file  Stress: Not on file  Social Connections: Not on file  Intimate Partner Violence: Not on file   History reviewed. No pertinent family history.    VITAL SIGNS BP 134/73    Pulse 73    Temp (!) 97.2 F (36.2 C)    Resp 18    Ht 5\' 4"  (1.626 m)    Wt 164 lb (74.4 kg)    SpO2 96%    BMI 28.15 kg/m   Outpatient Encounter Medications as of 07/20/2021  Medication Sig   acetaminophen (TYLENOL) 325 MG tablet Take 650 mg by mouth every 6 (six) hours as needed.    amLODipine (NORVASC) 10 MG tablet Take 1 tablet (10 mg total) by mouth daily.   atorvastatin (LIPITOR) 20 MG tablet Take 1 tablet (20 mg total) by mouth daily at 6 PM.   Balsam [place]-Castor Oil (VENELEX) OINT Apply topically. Apply to sacrum and bilateral buttocks qshift for prevention.   busPIRone (BUSPAR) 7.5 MG tablet Take 7.5 mg by mouth 2 (two) times daily. For Anxiety   Camphor-Menthol-Methyl Sal (SALONPAS) 3.07-27-08 % PTCH Apply topically. 1 patch, topical, Twice A Day, apply  on Right foot,right forearm,and Left Shoulder and neck in the AM and remove in the PM for pain management   lisinopril (ZESTRIL) 10 MG tablet Take 10 mg by mouth daily.   LORazepam (ATIVAN) 0.5 MG tablet Take 0.5 tablets (0.25 mg total) by mouth at bedtime. Give at bedtime   melatonin 3 MG TABS tablet Take 3 mg by mouth at bedtime.   metFORMIN (GLUCOPHAGE) 500 MG tablet Take 1 tablet by mouth 2 (two) times daily.   NON FORMULARY Diet Change: Regular, carbohydrate consistent with thin liquids   No facility-administered encounter medications on file as of 07/20/2021.     SIGNIFICANT DIAGNOSTIC EXAMS  PREVIOUS   02-03-20: DEXA t score -1.009   NO NEW EXAMS.   LABS REVIEWED PREVIOUS   SHE will allow one blood draw per year.  02-02-21: d-dimer 1.80 04-11-21: wbc 8.7; hgb 14.2; hct  43.6; mcv 94.0 plt 217; glucose 143; bun 16; creat 0.60; k+ 4.0; na++ 135; ca 9.0 GFR>60; hgb a1c 6.3; chol 115; ldl 43; trig 134; hdl 47; tsh 1.386 vit B 12: 261 vit D 29.23  NO NEW LABS.   Review of Systems  Constitutional:  Negative for malaise/fatigue.  Respiratory:  Negative for cough and shortness of breath.   Cardiovascular:  Negative for chest pain, palpitations and leg swelling.  Gastrointestinal:  Negative for abdominal pain, constipation and heartburn.  Musculoskeletal:  Negative for back pain, joint pain and myalgias.  Skin: Negative.   Neurological:  Negative for dizziness.  Psychiatric/Behavioral:  The patient is not nervous/anxious.    Physical Exam Constitutional:      General: She is not in acute distress.    Appearance: She is well-developed. She is not diaphoretic.  Neck:     Thyroid: No thyromegaly.  Cardiovascular:     Rate and Rhythm: Normal rate and regular rhythm.     Pulses: Normal pulses.     Heart sounds: Normal heart sounds.  Pulmonary:     Effort: Pulmonary effort is normal. No respiratory distress.     Breath sounds: Normal breath sounds.  Abdominal:     General: Bowel sounds are normal. There is no distension.     Palpations: Abdomen is soft.     Tenderness: There is no abdominal tenderness.  Musculoskeletal:     Cervical back: Neck supple.     Right lower leg: No edema.     Left lower leg: No edema.     Comments:  Right hemiplegia with contractures.  Lymphadenopathy:     Cervical: No cervical adenopathy.  Skin:    General: Skin is warm and dry.  Neurological:     Mental Status: She is alert. Mental status is at baseline.  Psychiatric:        Mood and Affect: Mood normal.      ASSESSMENT/ PLAN:  TODAY  Diabetes type 2 with peripheral artery disease Hemiplegia of right dominant side as late effect of cerebral infarction unspecified hemiplegia type Major depression with psychotic features  Will stop salon pas as she is declining  them Will continue current plan of care Will continue to monitor her status.   Time spent with patient: 40 minutes: medications plan of care.   Synthia Innocent NP [facility]  Contact [phone] Monday through Friday 8am- 5pm  After hours call [phone]

## 2021-07-24 ENCOUNTER — Other Ambulatory Visit: Payer: Self-pay | Admitting: Adult Health

## 2021-07-24 MED ORDER — LORAZEPAM 0.5 MG PO TABS: 0.2500 mg | ORAL_TABLET | Freq: Every day | ORAL | 0 refills | Status: DC

## 2021-08-02 ENCOUNTER — Encounter: Payer: Self-pay | Admitting: Internal Medicine

## 2021-08-02 ENCOUNTER — Non-Acute Institutional Stay (SKILLED_NURSING_FACILITY): Payer: Medicare Other | Admitting: Internal Medicine

## 2021-08-02 DIAGNOSIS — E1159 Type 2 diabetes mellitus with other circulatory complications: Secondary | ICD-10-CM | POA: Diagnosis not present

## 2021-08-02 DIAGNOSIS — F0630 Mood disorder due to known physiological condition, unspecified: Secondary | ICD-10-CM

## 2021-08-02 DIAGNOSIS — I69351 Hemiplegia and hemiparesis following cerebral infarction affecting right dominant side: Secondary | ICD-10-CM | POA: Diagnosis not present

## 2021-08-02 DIAGNOSIS — E1151 Type 2 diabetes mellitus with diabetic peripheral angiopathy without gangrene: Secondary | ICD-10-CM

## 2021-08-02 DIAGNOSIS — F323 Major depressive disorder, single episode, severe with psychotic features: Secondary | ICD-10-CM

## 2021-08-02 DIAGNOSIS — E559 Vitamin D deficiency, unspecified: Secondary | ICD-10-CM

## 2021-08-02 DIAGNOSIS — I152 Hypertension secondary to endocrine disorders: Secondary | ICD-10-CM

## 2021-08-02 DIAGNOSIS — I69398 Other sequelae of cerebral infarction: Secondary | ICD-10-CM
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
## 2021-08-03 ENCOUNTER — Encounter: Payer: Self-pay | Admitting: Internal Medicine

## 2021-08-03 NOTE — Assessment & Plan Note (Addendum)
Present A1c of 6.3% indicates excellent control.  No change indicated unless hypoglycemia documented.
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
## 2021-08-03 NOTE — Assessment & Plan Note (Signed)
Vit D supplement not in Epic Med List,this will be discussed with NP
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
## 2021-08-03 NOTE — Patient Instructions (Signed)
See assessment and plan under each diagnosis in the problem list and acutely for this visit 
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
## 2021-08-03 NOTE — Assessment & Plan Note (Signed)
BP controlled; no change in antihypertensive medications  

## 2021-08-23 ENCOUNTER — Other Ambulatory Visit: Payer: Self-pay | Admitting: Adult Health

## 2021-08-23 MED ORDER — LORAZEPAM 0.5 MG PO TABS: 0.2500 mg | ORAL_TABLET | Freq: Every day | ORAL | 0 refills | Status: DC

## 2021-08-31 ENCOUNTER — Non-Acute Institutional Stay (SKILLED_NURSING_FACILITY): Payer: Medicare Other | Admitting: Adult Health

## 2021-08-31 ENCOUNTER — Encounter: Payer: Self-pay | Admitting: Adult Health

## 2021-08-31 DIAGNOSIS — E1169 Type 2 diabetes mellitus with other specified complication: Secondary | ICD-10-CM

## 2021-08-31 DIAGNOSIS — I69391 Dysphagia following cerebral infarction: Secondary | ICD-10-CM | POA: Diagnosis not present

## 2021-08-31 DIAGNOSIS — E1159 Type 2 diabetes mellitus with other circulatory complications: Secondary | ICD-10-CM

## 2021-08-31 DIAGNOSIS — I615 Nontraumatic intracerebral hemorrhage, intraventricular: Secondary | ICD-10-CM

## 2021-08-31 DIAGNOSIS — F01518 Vascular dementia, unspecified severity, with other behavioral disturbance: Secondary | ICD-10-CM

## 2021-08-31 DIAGNOSIS — K5909 Other constipation: Secondary | ICD-10-CM | POA: Diagnosis not present

## 2021-08-31 DIAGNOSIS — F323 Major depressive disorder, single episode, severe with psychotic features: Secondary | ICD-10-CM

## 2021-08-31 DIAGNOSIS — E1151 Type 2 diabetes mellitus with diabetic peripheral angiopathy without gangrene: Secondary | ICD-10-CM

## 2021-08-31 DIAGNOSIS — I152 Hypertension secondary to endocrine disorders: Secondary | ICD-10-CM

## 2021-08-31 DIAGNOSIS — E785 Hyperlipidemia, unspecified: Secondary | ICD-10-CM

## 2021-08-31 DIAGNOSIS — I69351 Hemiplegia and hemiparesis following cerebral infarction affecting right dominant side: Secondary | ICD-10-CM
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
## 2021-09-19 ENCOUNTER — Other Ambulatory Visit: Payer: Self-pay | Admitting: Adult Health

## 2021-09-19 MED ORDER — LORAZEPAM 0.5 MG PO TABS: 0.2500 mg | ORAL_TABLET | Freq: Every day | ORAL | 0 refills | Status: DC

## 2021-09-27 ENCOUNTER — Encounter: Payer: Self-pay | Admitting: Adult Health

## 2021-09-27 ENCOUNTER — Non-Acute Institutional Stay (SKILLED_NURSING_FACILITY): Payer: Medicare Other | Admitting: Adult Health

## 2021-09-27 DIAGNOSIS — I152 Hypertension secondary to endocrine disorders: Secondary | ICD-10-CM

## 2021-09-27 DIAGNOSIS — E1169 Type 2 diabetes mellitus with other specified complication: Secondary | ICD-10-CM

## 2021-09-27 DIAGNOSIS — K5909 Other constipation: Secondary | ICD-10-CM

## 2021-09-27 DIAGNOSIS — E785 Hyperlipidemia, unspecified: Secondary | ICD-10-CM

## 2021-09-27 DIAGNOSIS — E1159 Type 2 diabetes mellitus with other circulatory complications: Secondary | ICD-10-CM

## 2021-09-27 DIAGNOSIS — I69391 Dysphagia following cerebral infarction: Secondary | ICD-10-CM | POA: Diagnosis not present

## 2021-09-27 NOTE — Progress Notes (Signed)
? ?Location:  [facility] ?Nursing Home Room Number: 123 ?Place of Service:  SNF (31) ? ? ?CODE STATUS: dnr  ? ?Allergies  ?Allergen Reactions  ? Contrast Media [Iodinated Contrast Media] Anaphylaxis  ? Betamethasone Valerate Other (See Comments)  ? ? ?Chief Complaint  ?Patient presents with  ? Medical Management of Chronic Issues ? ?              Dysphagia due to CVA:  Dyslipidemia associated with type 2 diabetes mellitus:  Chronic constipation:  Hypertension associated with type 2 diabetes mellitus   ? ? ?HPI: ? ?She is a 88 year old long term resident of this facility being seen for the management of her chronic illnesses:   Dysphagia due to CVA:  Dyslipidemia associated with type 2 diabetes mellitus:  Chronic constipation:  Hypertension associated with type 2 diabetes mellitus. There are no reports of uncontrolled pain. She will not take any additional medications for her pain management.  ? ?Past Medical History:  ?Diagnosis Date  ? Arthritis   ? Coronary artery disease   ? angina  ? Diabetes mellitus with vascular complications   ? History of CVA (cerebrovascular accident)   ? Hypertension   ? ? ?Past Surgical History:  ?Procedure Laterality Date  ? APPENDECTOMY    ? CHOLECYSTECTOMY    ? ? ?Social History  ? ?Socioeconomic History  ? Marital status: Widowed  ?  Spouse name: Not on file  ? Number of children: Not on file  ? Years of education: Not on file  ? Highest education level: Not on file  ?Occupational History  ? Not on file  ?Tobacco Use  ? Smoking status: Never  ? Smokeless tobacco: Never  ?Vaping Use  ? Vaping Use: Never used  ?Substance and Sexual Activity  ? Alcohol use: No  ? Drug use: No  ? Sexual activity: Not on file  ?Other Topics Concern  ? Not on file  ?Social History Narrative  ? Not on file  ? ?Social Determinants of Health  ? ?Financial Resource Strain: Not on file  ?Food Insecurity: Not on file  ?Transportation Needs: Not on file  ?Physical Activity: Not on file  ?Stress: Not on  file  ?Social Connections: Not on file  ?Intimate Partner Violence: Not on file  ? ?No family history on file. ? ? ? ?VITAL SIGNS ?BP 130/62   Pulse 62   Temp 98 ?F (36.7 ?C)   Resp 20   Ht 5\' 4"  (1.626 m)   Wt 164 lb (74.4 kg)   BMI 28.15 kg/m?  ? ?Outpatient Encounter Medications as of 09/27/2021  ?Medication Sig  ? acetaminophen (TYLENOL) 325 MG tablet Take 650 mg by mouth every 6 (six) hours as needed.   ? amLODipine (NORVASC) 10 MG tablet Take 1 tablet (10 mg total) by mouth daily.  ? atorvastatin (LIPITOR) 20 MG tablet Take 1 tablet (20 mg total) by mouth daily at 6 PM.  ? Balsam [place]-Castor Oil (VENELEX) OINT Apply topically. Apply to sacrum and bilateral buttocks qshift for prevention.  ? busPIRone (BUSPAR) 7.5 MG tablet Take 7.5 mg by mouth 2 (two) times daily. For Anxiety  ? Camphor-Menthol-Methyl Sal (SALONPAS) 3.07-27-08 % PTCH Apply topically. 1 patch, topical, Twice A Day, apply on Right foot,right forearm,and Left Shoulder and neck in the AM and remove in the PM for pain management  ? lisinopril (ZESTRIL) 10 MG tablet Take 10 mg by mouth daily.  ? LORazepam (ATIVAN) 0.5 MG tablet  Take 0.5 tablets (0.25 mg total) by mouth at bedtime. Give at bedtime  ? melatonin 3 MG TABS tablet Take 3 mg by mouth at bedtime.  ? metFORMIN (GLUCOPHAGE) 500 MG tablet Take 1 tablet by mouth 2 (two) times daily.  ? NON FORMULARY Diet Change: Regular, carbohydrate consistent with thin liquids  ? ?No facility-administered encounter medications on file as of 09/27/2021.  ? ? ? ?SIGNIFICANT DIAGNOSTIC EXAMS ? ?PREVIOUS  ? ?02-03-20: DEXA t score -1.009 ? ? ?NO NEW EXAMS.  ? ?LABS REVIEWED PREVIOUS   SHE will allow one blood draw per year.  ?02-02-21: d-dimer 1.80 ?04-11-21: wbc 8.7; hgb 14.2; hct 43.6; mcv 94.0 plt 217; glucose 143; bun 16; creat 0.60; k+ 4.0; na++ 135; ca 9.0 GFR>60; hgb a1c 6.3; chol 115; ldl 43; trig 134; hdl 47; tsh 1.386 vit B 12: 261 vit D 29.23 ? ?NO NEW LABS.  ? ?Review of Systems  ?Constitutional:   Negative for malaise/fatigue.  ?Respiratory:  Negative for cough and shortness of breath.   ?Cardiovascular:  Negative for chest pain, palpitations and leg swelling.  ?Gastrointestinal:  Negative for abdominal pain, constipation and heartburn.  ?Musculoskeletal:  Positive for back pain, joint pain and myalgias.  ?Skin: Negative.   ?Neurological:  Negative for dizziness.  ?Psychiatric/Behavioral:  The patient is not nervous/anxious.   ? ?Physical Exam ?Constitutional:   ?   General: She is not in acute distress. ?   Appearance: She is well-developed. She is not diaphoretic.  ?Neck:  ?   Thyroid: No thyromegaly.  ?Cardiovascular:  ?   Rate and Rhythm: Normal rate and regular rhythm.  ?   Pulses: Normal pulses.  ?   Heart sounds: Normal heart sounds.  ?Pulmonary:  ?   Effort: Pulmonary effort is normal. No respiratory distress.  ?   Breath sounds: Normal breath sounds.  ?Abdominal:  ?   General: Bowel sounds are normal. There is no distension.  ?   Palpations: Abdomen is soft.  ?   Tenderness: There is no abdominal tenderness.  ?Musculoskeletal:  ?   Cervical back: Neck supple.  ?   Right lower leg: No edema.  ?   Left lower leg: No edema.  ?   Comments:  Right hemiplegia with contractures.   ?Lymphadenopathy:  ?   Cervical: No cervical adenopathy.  ?Skin: ?   General: Skin is warm and dry.  ?Neurological:  ?   Mental Status: She is alert. Mental status is at baseline.  ?Psychiatric:     ?   Mood and Affect: Mood normal.  ? ? ?ASSESSMENT/ PLAN: ? ?TODAY ? ?Dysphagia due to CVA: is without signs of aspiration presently on thin liquids ? ?2. Dyslipidemia associated with type 2 diabetes mellitus: is stable ldl 43 will continue lipitor 20 mg daily  ? ?3. Chronic constipation: is stable will continue senna s twice daily  ? ?4. Hypertension associated with type 2 diabetes mellitus is stable b/p 130/62 will continue lisinopril 10 mg daily and norvasc 10 mg daily  ? ?PREVIOUS  ? ?5. Type 2 diabetes mellitus with peripheral  artery disease: is stable hgb a1c 6.3 will continue metformin 500 mg twice daily is on ace and statin.  ? ?6. Major depression with psychotic features: is without change will continue ativan 0.25 mg nightly (has failed one wean) buspar 7.5 mg twice daily is off zoloft per her request will not take other medications.  ? ?7. 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 ?8. Vascular dementia with behavioral disturbance: is stable weight is 164 pounds will monitor  ? ? ? ?Ok Edwards NP ?[facility]  ?call [phone]  ? ?

## 2021-10-12 ENCOUNTER — Non-Acute Institutional Stay (SKILLED_NURSING_FACILITY): Payer: Medicare Other | Admitting: Adult Health

## 2021-10-12 ENCOUNTER — Encounter: Payer: Self-pay | Admitting: Adult Health

## 2021-10-12 DIAGNOSIS — E1151 Type 2 diabetes mellitus with diabetic peripheral angiopathy without gangrene: Secondary | ICD-10-CM | POA: Diagnosis not present

## 2021-10-12 DIAGNOSIS — F01518 Vascular dementia, unspecified severity, with other behavioral disturbance: Secondary | ICD-10-CM

## 2021-10-12 DIAGNOSIS — I69351 Hemiplegia and hemiparesis following cerebral infarction affecting right dominant side: Secondary | ICD-10-CM | POA: Diagnosis not present

## 2021-10-12 NOTE — Progress Notes (Signed)
?Location:  [facility] ?Nursing Home Room Number: 123-P ?Place of Service:  SNF (31) ? ? ?CODE STATUS: DNR ? ?Allergies  ?Allergen Reactions  ? Contrast Media [Iodinated Contrast Media] Anaphylaxis  ? Betamethasone Valerate Other (See Comments)  ? ? ?Chief Complaint  ?Patient presents with  ? Acute Visit  ?  Care plan meeting  ? ? ?HPI: ? ?We have come together for her care plan meeting. BIMS 15/15 mood 0/30: will decline care; covid testing; medications. Medications have been found in room from an outside source. She requires extensive to dependent for her adls care. She is incontinent of bladder and bowel. She spends all of her time in bed per her choice. There have been no falls. Dietary: weight is stable regular diet: appetite is fair to good. She continues to be followed for her chronic illnesses including:  Diabetes mellitus type 2 with peripheral artery disease  Hemiplegia of right dominant side as late effect of cerebral infarction unspecified hemiplegia type  Vascular dementia with behavioral disturbance ? ?Past Medical History:  ?Diagnosis Date  ? Arthritis   ? Coronary artery disease   ? angina  ? Diabetes mellitus with vascular complications   ? History of CVA (cerebrovascular accident)   ? Hypertension   ? ? ?Past Surgical History:  ?Procedure Laterality Date  ? APPENDECTOMY    ? CHOLECYSTECTOMY    ? ? ?Social History  ? ?Socioeconomic History  ? Marital status: Widowed  ?  Spouse name: Not on file  ? Number of children: Not on file  ? Years of education: Not on file  ? Highest education level: Not on file  ?Occupational History  ? Not on file  ?Tobacco Use  ? Smoking status: Never  ? Smokeless tobacco: Never  ?Vaping Use  ? Vaping Use: Never used  ?Substance and Sexual Activity  ? Alcohol use: No  ? Drug use: No  ? Sexual activity: Not on file  ?Other Topics Concern  ? Not on file  ?Social History Narrative  ? Not on file  ? ?Social Determinants of Health  ? ?Financial Resource Strain: Not on  file  ?Food Insecurity: Not on file  ?Transportation Needs: Not on file  ?Physical Activity: Not on file  ?Stress: Not on file  ?Social Connections: Not on file  ?Intimate Partner Violence: Not on file  ? ?History reviewed. No pertinent family history. ? ? ? ?VITAL SIGNS ?BP 135/60   Pulse (!) 58   Temp (!) 97.1 ?F (36.2 ?C)   Ht 5\' 4"  (1.626 m)   BMI 28.15 kg/m?  ? ?Outpatient Encounter Medications as of 10/12/2021  ?Medication Sig  ? acetaminophen (TYLENOL) 325 MG tablet Take 650 mg by mouth every 6 (six) hours as needed.   ? amLODipine (NORVASC) 10 MG tablet Take 1 tablet (10 mg total) by mouth daily.  ? atorvastatin (LIPITOR) 20 MG tablet Take 1 tablet (20 mg total) by mouth daily at 6 PM.  ? Balsam [place]-Castor Oil (VENELEX) OINT Apply topically. Apply to sacrum and bilateral buttocks qshift for prevention.  ? busPIRone (BUSPAR) 7.5 MG tablet Take 7.5 mg by mouth 2 (two) times daily. For Anxiety  ? lisinopril (ZESTRIL) 10 MG tablet Take 10 mg by mouth daily.  ? LORazepam (ATIVAN) 0.5 MG tablet Take 0.5 tablets (0.25 mg total) by mouth at bedtime. Give at bedtime  ? melatonin 3 MG TABS tablet Take 3 mg by mouth at bedtime.  ? metFORMIN (GLUCOPHAGE) 500 MG tablet Take  1 tablet by mouth 2 (two) times daily.  ? miconazole (ZEASORB-AF) 2 % powder small amt; topical,As Needed ?Special Instructions: prn as needed under abd folds/under arms or groin  ? NON FORMULARY Diet Change: Regular, carbohydrate consistent with thin liquids  ? Camphor-Menthol-Methyl Sal (SALONPAS) 3.07-27-08 % PTCH Apply topically. 1 patch, topical, Twice A Day, apply on Right foot,right forearm,and Left Shoulder and neck in the AM and remove in the PM for pain management  ? ?No facility-administered encounter medications on file as of 10/12/2021.  ? ? ? ?SIGNIFICANT DIAGNOSTIC EXAMS ? ?PREVIOUS  ? ?02-03-20: DEXA t score -1.009 ? ? ?NO NEW EXAMS.  ? ?LABS REVIEWED PREVIOUS   SHE will allow one blood draw per year.  ?02-02-21: d-dimer 1.80 ?04-11-21:  wbc 8.7; hgb 14.2; hct 43.6; mcv 94.0 plt 217; glucose 143; bun 16; creat 0.60; k+ 4.0; na++ 135; ca 9.0 GFR>60; hgb a1c 6.3; chol 115; ldl 43; trig 134; hdl 47; tsh 1.386 vit B 12: 261 vit D 29.23 ? ?NO NEW LABS.  ? ?Review of Systems  ?Constitutional:  Negative for malaise/fatigue.  ?Respiratory:  Negative for cough and shortness of breath.   ?Cardiovascular:  Negative for chest pain, palpitations and leg swelling.  ?Gastrointestinal:  Negative for abdominal pain, constipation and heartburn.  ?Musculoskeletal:  Positive for back pain, joint pain and myalgias.  ?Skin: Negative.   ?Neurological:  Negative for dizziness.  ?Psychiatric/Behavioral:  The patient is not nervous/anxious.   ?   ? ?Physical Exam ?Constitutional:   ?   General: She is not in acute distress. ?   Appearance: She is well-developed. She is not diaphoretic.  ?Neck:  ?   Thyroid: No thyromegaly.  ?Cardiovascular:  ?   Rate and Rhythm: Normal rate and regular rhythm.  ?   Pulses: Normal pulses.  ?   Heart sounds: Normal heart sounds.  ?Pulmonary:  ?   Effort: Pulmonary effort is normal. No respiratory distress.  ?   Breath sounds: Normal breath sounds.  ?Abdominal:  ?   General: Bowel sounds are normal. There is no distension.  ?   Palpations: Abdomen is soft.  ?   Tenderness: There is no abdominal tenderness.  ?Musculoskeletal:  ?   Cervical back: Neck supple.  ?   Right lower leg: No edema.  ?   Left lower leg: No edema.  ?   Comments:  Right hemiplegia with contractures.    ?Lymphadenopathy:  ?   Cervical: No cervical adenopathy.  ?Skin: ?   General: Skin is warm and dry.  ?Neurological:  ?   Mental Status: She is alert. Mental status is at baseline.  ?Psychiatric:     ?   Mood and Affect: Mood normal.  ? ? ?ASSESSMENT/ PLAN: ? ?TODAY ? ?Diabetes mellitus type 2 with peripheral artery disease ?Hemiplegia of right dominant side as late effect of cerebral infarction unspecified hemiplegia type ?Vascular dementia with behavioral disturbance ? ?Will  continue current medications ?Will continue current plan of care ?Will continue to monitor her status.  ? ?Time spent with patient 40 minutes: medications; plan of care; goals of care.  ? ? ?Ok Edwards NP ?[facility]  ?call [phone]  ? ?

## 2021-10-18 ENCOUNTER — Other Ambulatory Visit: Payer: Self-pay | Admitting: Adult Health

## 2021-10-18 MED ORDER — LORAZEPAM 0.5 MG PO TABS: 0.2500 mg | ORAL_TABLET | Freq: Every day | ORAL | 0 refills | Status: DC

## 2021-10-24 ENCOUNTER — Encounter: Payer: Self-pay | Admitting: Adult Health

## 2021-10-24 ENCOUNTER — Non-Acute Institutional Stay (SKILLED_NURSING_FACILITY): Payer: Medicare Other | Admitting: Adult Health

## 2021-10-24 DIAGNOSIS — Z0000 Encounter for general adult medical examination without abnormal findings: Secondary | ICD-10-CM

## 2021-10-24 NOTE — Progress Notes (Signed)
? ? ?Subjective:  ? [name] [name] is a 88 y.o. female who presents for Medicare Annual (Subsequent) preventive examination. ? ?Review of Systems    ?Review of Systems  ?Constitutional:  Negative for malaise/fatigue.  ?Respiratory:  Negative for cough and shortness of breath.   ?Cardiovascular:  Negative for chest pain, palpitations and leg swelling.  ?Gastrointestinal:  Negative for abdominal pain, constipation and heartburn.  ?Musculoskeletal:  Negative for back pain, joint pain and myalgias.  ?Skin: Negative.   ?Neurological:  Negative for dizziness.  ?Psychiatric/Behavioral:  The patient is not nervous/anxious.   ? ?Cardiac Risk Factors include: advanced age (>34men, >33 women);diabetes mellitus;dyslipidemia;obesity (BMI >30kg/m2);sedentary lifestyle ? ?   ?Objective:  ?  ?Today's Vitals  ? 10/24/21 0930 10/24/21 1437  ?BP: (!) 121/56   ?Pulse: (!) 52   ?Temp: 98.1 ?F (36.7 ?C)   ?Weight: 164 lb (74.4 kg)   ?Height: 5\' 4"  (1.626 m)   ?PainSc:  8   ? ?Body mass index is 28.15 kg/m?. ? ? ?  10/24/2021  ?  9:31 AM 10/12/2021  ? 10:41 AM 08/31/2021  ? 11:56 AM 07/20/2021  ?  8:44 AM 07/04/2021  ?  8:34 AM 06/05/2021  ? 10:55 AM 04/20/2021  ?  9:07 AM  ?Advanced Directives  ?Does Patient Have a Medical Advance Directive? Yes Yes Yes Yes Yes Yes Yes  ?Type of Advance Directive Out of facility DNR (pink MOST or yellow form) Out of facility DNR (pink MOST or yellow form) Out of facility DNR (pink MOST or yellow form) Out of facility DNR (pink MOST or yellow form) Out of facility DNR (pink MOST or yellow form)  Out of facility DNR (pink MOST or yellow form)  Out of facility DNR (pink MOST or yellow form)  ?Does patient want to make changes to medical advance directive? No - Patient declined No - Patient declined No - Patient declined No - Patient declined No - Patient declined No - Patient declined No - Patient declined  ?  ? Significant value  ? ? ?Current Medications (verified) ?Outpatient Encounter Medications as of  10/24/2021  ?Medication Sig  ? acetaminophen (TYLENOL) 325 MG tablet Take 650 mg by mouth every 6 (six) hours as needed.   ? amLODipine (NORVASC) 10 MG tablet Take 1 tablet (10 mg total) by mouth daily.  ? atorvastatin (LIPITOR) 20 MG tablet Take 1 tablet (20 mg total) by mouth daily at 6 PM.  ? Balsam [place]-Castor Oil (VENELEX) OINT Apply topically. Apply to sacrum and bilateral buttocks qshift for prevention.  ? busPIRone (BUSPAR) 7.5 MG tablet Take 7.5 mg by mouth 2 (two) times daily. For Anxiety  ? lisinopril (ZESTRIL) 10 MG tablet Take 10 mg by mouth daily.  ? LORazepam (ATIVAN) 0.5 MG tablet Take 0.5 tablets (0.25 mg total) by mouth at bedtime. Give at bedtime  ? melatonin 3 MG TABS tablet Take 3 mg by mouth at bedtime.  ? metFORMIN (GLUCOPHAGE) 500 MG tablet Take 1 tablet by mouth 2 (two) times daily.  ? miconazole (ZEASORB-AF) 2 % powder small amt; topical,As Needed ?Special Instructions: prn as needed under abd folds/under arms or groin  ? NON FORMULARY Diet Change: Regular  ? Camphor-Menthol-Methyl Sal (SALONPAS) 3.07-27-08 % PTCH Apply topically. 1 patch, topical, Twice A Day, apply on Right foot,right forearm,and Left Shoulder and neck in the AM and remove in the PM for pain management  ? ?No facility-administered encounter medications on file as of 10/24/2021.  ? ? ?Allergies (verified) ?  Contrast media [iodinated contrast media] and Betamethasone valerate  ? ?History: ?Past Medical History:  ?Diagnosis Date  ? Arthritis   ? Coronary artery disease   ? angina  ? Diabetes mellitus with vascular complications   ? History of CVA (cerebrovascular accident)   ? Hypertension   ? ?Past Surgical History:  ?Procedure Laterality Date  ? APPENDECTOMY    ? CHOLECYSTECTOMY    ? ?History reviewed. No pertinent family history. ?Social History  ? ?Socioeconomic History  ? Marital status: Widowed  ?  Spouse name: Not on file  ? Number of children: Not on file  ? Years of education: Not on file  ? Highest education level: Not on  file  ?Occupational History  ? Not on file  ?Tobacco Use  ? Smoking status: Never  ? Smokeless tobacco: Never  ?Vaping Use  ? Vaping Use: Never used  ?Substance and Sexual Activity  ? Alcohol use: No  ? Drug use: No  ? Sexual activity: Not on file  ?Other Topics Concern  ? Not on file  ?Social History Narrative  ? Not on file  ? ?Social Determinants of Health  ? ?Financial Resource Strain: Not on file  ?Food Insecurity: Not on file  ?Transportation Needs: Not on file  ?Physical Activity: Not on file  ?Stress: Not on file  ?Social Connections: Not on file  ? ? ?Tobacco Counseling ?Counseling given: Not Answered ? ? ?Clinical Intake: ? ?Pre-visit preparation completed: Yes ? ?Pain : 0-10 ?Pain Score: 8  ?Faces Pain Scale: Hurts whole lot ?Pain Type: Chronic pain ?Pain Location: Back ?Pain Orientation: Distal ?Pain Descriptors / Indicators: Aching, Nagging ?Pain Onset: More than a month ago ?Pain Frequency: Intermittent ? ?Faces Pain Scale: Hurts whole lot ? ?BMI - recorded: 28.15 ?Nutritional Status: BMI 25 -29 Overweight ?Nutritional Risks: Unintentional weight gain ?Diabetes: Yes ?CBG done?: Yes ?CBG resulted in Enter/ Edit results?: Yes ?Did pt. bring in CBG monitor from home?: No ? ?How often do you need to have someone help you when you read instructions, pamphlets, or other written materials from your doctor or pharmacy?: 5 - Always ? ?Diabetic?yes ? ?Interpreter Needed?: No ? ?  ? ? ?Activities of Daily Living ? ?  10/24/2021  ?  2:39 PM  ?In your present state of health, do you have any difficulty performing the following activities:  ?Hearing? 0  ?Vision? 0  ?Difficulty concentrating or making decisions? 1  ?Walking or climbing stairs? 1  ?Dressing or bathing? 1  ?Doing errands, shopping? 1  ?Preparing Food and eating ? Y  ?Using the Toilet? Y  ?In the past six months, have you accidently leaked urine? Y  ?Do you have problems with loss of bowel control? Y  ?Managing your Medications? Y  ?Managing your  Finances? Y  ? ? ?Patient Care Team: ?Sharee Holster, NP as PCP - General (Geriatric Medicine) ?[facility] ? ?Indicate any recent Medical Services you may have received from other than [facility] providers in the past year (date may be approximate). ? ?   ?Assessment:  ? This is a routine wellness examination for [name] [name]. ? ?Hearing/Vision screen ?No results found. ? ?Dietary issues and exercise activities discussed: ?Current Exercise Habits: The patient does not participate in regular exercise at present, Exercise limited by: neurologic condition(s) ? ? Goals Addressed   ? ?  ?  ?  ?  ? This Visit's Progress  ?  Absence of Fall and Fall-Related Injury  On track  ?  Evidence-based guidance:  ?Assess fall risk using a validated tool when available. Consider balance and gait impairment, muscle weakness, diminished vision or hearing, environmental hazards, presence of urinary or bowel urgency and/or incontinence.  ?Communicate fall injury risk to interprofessional healthcare team.  ?Develop a fall prevention plan with the patient and family.  ?Promote use of personal vision and auditory aids.  ?Promote reorientation, appropriate sensory stimulation, and routines to decrease risk of fall when changes in mental status are present.  ?Assess assistance level required for safe and effective self-care; consider referral for home care.  ?Encourage physical activity, such as performance of self-care at highest level of ability, strength and balance exercise program, and provision of appropriate assistive devices; refer to rehabilitation therapy.  ?Refer to community-based fall prevention program where available.  ?If fall occurs, determine the cause and revise fall injury prevention plan.  ?Regularly review medication contribution to fall risk; consider risk related to polypharmacy and age.  ?Refer to pharmacist for consultation when concerns about medications are revealed.  ?Balance adequate  pain management with potential for oversedation.  ?Provide guidance related to environmental modifications.  ?Consider supplementation with Vitamin D.   ?Notes:  ?  ?  Follow up with Primary Care Provider

## 2021-10-31 ENCOUNTER — Encounter: Payer: Self-pay | Admitting: Adult Health

## 2021-10-31 ENCOUNTER — Non-Acute Institutional Stay (SKILLED_NURSING_FACILITY): Payer: Medicare Other | Admitting: Adult Health

## 2021-10-31 DIAGNOSIS — F323 Major depressive disorder, single episode, severe with psychotic features: Secondary | ICD-10-CM

## 2021-10-31 DIAGNOSIS — I69351 Hemiplegia and hemiparesis following cerebral infarction affecting right dominant side: Secondary | ICD-10-CM

## 2021-10-31 DIAGNOSIS — I615 Nontraumatic intracerebral hemorrhage, intraventricular: Secondary | ICD-10-CM | POA: Diagnosis not present

## 2021-10-31 DIAGNOSIS — E1151 Type 2 diabetes mellitus with diabetic peripheral angiopathy without gangrene: Secondary | ICD-10-CM | POA: Diagnosis not present

## 2021-10-31 NOTE — Progress Notes (Signed)
?Location:  [facility] ?Nursing Home Room Number: 123P ?Place of Service:  SNF (31) ?Provider:  Synthia Innocent, NP ? ? ?CODE STATUS: DNR ? ?Allergies  ?Allergen Reactions  ? Contrast Media [Iodinated Contrast Media] Anaphylaxis  ? Betamethasone Valerate Other (See Comments)  ? ? ?Chief Complaint  ?Patient presents with  ? Medical Management of Chronic Issues  ?                     Type 2 diabetes mellitus with peripheral artery disease:  Major depression with psychotic features:  IVH (intraventricular hemorrhage) / hemiplegia right dominant side as late effect cerebral infarction unspecified hemiplegia type:  ? ? ?HPI: ? ?She is a 88 year old long term resident of this facility being seen for the management of her chronic illnesses:  Type 2 diabetes mellitus with peripheral artery disease:  Major depression with psychotic features:  IVH (intraventricular hemorrhage) / hemiplegia right dominant side as late effect cerebral infarction unspecified hemiplegia type:[name] She does have chronic pain; which is managed. She does spend all of her time in bed per her choice. There are no reports of weight loss.  ? ?Past Medical History:  ?Diagnosis Date  ? Arthritis   ? Coronary artery disease   ? angina  ? Diabetes mellitus with vascular complications   ? History of CVA (cerebrovascular accident)   ? Hypertension   ? ? ?Past Surgical History:  ?Procedure Laterality Date  ? APPENDECTOMY    ? CHOLECYSTECTOMY    ? ? ?Social History  ? ?Socioeconomic History  ? Marital status: Widowed  ?  Spouse name: Not on file  ? Number of children: Not on file  ? Years of education: Not on file  ? Highest education level: Not on file  ?Occupational History  ? Not on file  ?Tobacco Use  ? Smoking status: Never  ? Smokeless tobacco: Never  ?Vaping Use  ? Vaping Use: Never used  ?Substance and Sexual Activity  ? Alcohol use: No  ? Drug use: No  ? Sexual activity: Not on file  ?Other Topics Concern  ? Not on file  ?Social History Narrative   ? Not on file  ? ?Social Determinants of Health  ? ?Financial Resource Strain: Not on file  ?Food Insecurity: Not on file  ?Transportation Needs: Not on file  ?Physical Activity: Not on file  ?Stress: Not on file  ?Social Connections: Not on file  ?Intimate Partner Violence: Not on file  ? ?History reviewed. No pertinent family history. ? ? ? ?VITAL SIGNS ?BP 134/67   Pulse 65   Temp 98.4 ?F (36.9 ?C)   Resp 18   Ht 5\' 4"  (1.626 m)   Wt 164 lb (74.4 kg)   SpO2 96%   BMI 28.15 kg/m?  ? ?Outpatient Encounter Medications as of 10/31/2021  ?Medication Sig  ? acetaminophen (TYLENOL) 325 MG tablet Take 650 mg by mouth every 6 (six) hours as needed.   ? amLODipine (NORVASC) 10 MG tablet Take 1 tablet (10 mg total) by mouth daily.  ? atorvastatin (LIPITOR) 20 MG tablet Take 1 tablet (20 mg total) by mouth daily at 6 PM.  ? Balsam [place]-Castor Oil (VENELEX) OINT Apply topically. Apply to sacrum and bilateral buttocks qshift for prevention.  ? busPIRone (BUSPAR) 7.5 MG tablet Take 7.5 mg by mouth 2 (two) times daily. For Anxiety  ? lisinopril (ZESTRIL) 10 MG tablet Take 10 mg by mouth daily.  ? LORazepam (ATIVAN) 0.5  MG tablet Take 0.5 tablets (0.25 mg total) by mouth at bedtime. Give at bedtime  ? melatonin 3 MG TABS tablet Take 3 mg by mouth at bedtime.  ? metFORMIN (GLUCOPHAGE) 500 MG tablet Take 1 tablet by mouth 2 (two) times daily.  ? miconazole (ZEASORB-AF) 2 % powder small amt; topical,As Needed ?Special Instructions: prn as needed under abd folds/under arms or groin  ? NON FORMULARY Diet Change: Regular  ? Camphor-Menthol-Methyl Sal (SALONPAS) 3.07-27-08 % PTCH Apply topically. 1 patch, topical, Twice A Day, apply on Right foot,right forearm,and Left Shoulder and neck in the AM and remove in the PM for pain management  ? ?No facility-administered encounter medications on file as of 10/31/2021.  ? ? ? ?SIGNIFICANT DIAGNOSTIC EXAMS ? ?PREVIOUS  ? ?02-03-20: DEXA t score -1.009 ? ? ?NO NEW EXAMS.  ? ?LABS REVIEWED  PREVIOUS   SHE will allow one blood draw per year.  ?02-02-21: d-dimer 1.80 ?04-11-21: wbc 8.7; hgb 14.2; hct 43.6; mcv 94.0 plt 217; glucose 143; bun 16; creat 0.60; k+ 4.0; na++ 135; ca 9.0 GFR>60; hgb a1c 6.3; chol 115; ldl 43; trig 134; hdl 47; tsh 1.386 vit B 12: 261 vit D 29.23 ? ?NO NEW LABS.  ? ?Review of Systems  ?Constitutional:  Negative for malaise/fatigue.  ?Respiratory:  Negative for cough and shortness of breath.   ?Cardiovascular:  Negative for chest pain, palpitations and leg swelling.  ?Gastrointestinal:  Negative for abdominal pain, constipation and heartburn.  ?Musculoskeletal:  Positive for back pain, joint pain and myalgias.  ?Skin: Negative.   ?Neurological:  Negative for dizziness.  ?Psychiatric/Behavioral:  The patient is not nervous/anxious.   ? ?Physical Exam ?Constitutional:   ?   General: She is not in acute distress. ?   Appearance: She is well-developed. She is not diaphoretic.  ?Neck:  ?   Thyroid: No thyromegaly.  ?Cardiovascular:  ?   Rate and Rhythm: Normal rate and regular rhythm.  ?   Pulses: Normal pulses.  ?   Heart sounds: Normal heart sounds.  ?Pulmonary:  ?   Effort: Pulmonary effort is normal. No respiratory distress.  ?   Breath sounds: Normal breath sounds.  ?Abdominal:  ?   General: Bowel sounds are normal. There is no distension.  ?   Palpations: Abdomen is soft.  ?   Tenderness: There is no abdominal tenderness.  ?Musculoskeletal:  ?   Cervical back: Neck supple.  ?   Right lower leg: No edema.  ?   Left lower leg: No edema.  ?   Comments: Right hemiplegia with contractures.    ?Lymphadenopathy:  ?   Cervical: No cervical adenopathy.  ?Skin: ?   General: Skin is warm and dry.  ?Neurological:  ?   Mental Status: She is alert. Mental status is at baseline.  ?Psychiatric:     ?   Mood and Affect: Mood normal.  ? ?  ?ASSESSMENT/ PLAN: ? ?TODAY ? ?Type 2 diabetes mellitus with peripheral artery disease: hgb a1c 6.3 will continue metformin 500 mg twice daily is on ace and  statin ? ?2. Major depression with psychotic features: will continue ativan 0.25 mg nightly (failed one wean); buspar 7.5 mg twice daily is off zoloft per her request.  ? ?3. IVH (intraventricular hemorrhage) / hemiplegia right dominant side as late effect cerebral infarction unspecified hemiplegia type: has contractures will monitor  ? ? ?PREVIOUS  ? ?4. Vascular dementia with behavioral disturbance: is stable weight is 164 pounds will monitor  ? ?  5. Dysphagia due to CVA: is without signs of aspiration presently on thin liquids ? ?6. Dyslipidemia associated with type 2 diabetes mellitus: is stable ldl 43 will continue lipitor 20 mg daily  ? ?7. Chronic constipation: is stable will continue senna s twice daily  ? ?8. Hypertension associated with type 2 diabetes mellitus is stable b/p 130/62 will continue lisinopril 10 mg daily and norvasc 10 mg daily  ? ? ? ? ? ?Synthia Innocent NP ?[facility]  ?call [phone]   ?

## 2021-11-15 ENCOUNTER — Other Ambulatory Visit: Payer: Self-pay | Admitting: Adult Health

## 2021-11-15 MED ORDER — LORAZEPAM 0.5 MG PO TABS: 0.2500 mg | ORAL_TABLET | Freq: Every day | ORAL | 0 refills | Status: DC

## 2021-12-04 ENCOUNTER — Non-Acute Institutional Stay (SKILLED_NURSING_FACILITY): Payer: Medicare Other | Admitting: Internal Medicine

## 2021-12-04 ENCOUNTER — Encounter: Payer: Self-pay | Admitting: Internal Medicine

## 2021-12-04 DIAGNOSIS — T2102XA Burn of unspecified degree of abdominal wall, initial encounter: Secondary | ICD-10-CM | POA: Insufficient documentation

## 2021-12-04 DIAGNOSIS — E1159 Type 2 diabetes mellitus with other circulatory complications: Secondary | ICD-10-CM

## 2021-12-04 DIAGNOSIS — F01518 Vascular dementia, unspecified severity, with other behavioral disturbance: Secondary | ICD-10-CM

## 2021-12-04 DIAGNOSIS — T2112XA Burn of first degree of abdominal wall, initial encounter: Secondary | ICD-10-CM

## 2021-12-04 DIAGNOSIS — E1169 Type 2 diabetes mellitus with other specified complication: Secondary | ICD-10-CM | POA: Diagnosis not present

## 2021-12-04 DIAGNOSIS — E785 Hyperlipidemia, unspecified: Secondary | ICD-10-CM

## 2021-12-04 DIAGNOSIS — E1151 Type 2 diabetes mellitus with diabetic peripheral angiopathy without gangrene: Secondary | ICD-10-CM | POA: Diagnosis not present

## 2021-12-04 DIAGNOSIS — I152 Hypertension secondary to endocrine disorders: Secondary | ICD-10-CM

## 2021-12-04 DIAGNOSIS — E538 Deficiency of other specified B group vitamins: Secondary | ICD-10-CM

## 2021-12-04 NOTE — Assessment & Plan Note (Signed)
In September 2022 LDL was at goal at 43.  No change in statin therapy; monitor annually. ?Although she is 37; statin will be continued as she has CAD and history of stroke. ?

## 2021-12-04 NOTE — Assessment & Plan Note (Signed)
CBC and differential was normal with normal indices.  B12 level was normal in September 2022.  Annual monitor will suffice. ?
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
## 2021-12-04 NOTE — Assessment & Plan Note (Signed)
Topical antibiotic to cellulitis around eschar. ?

## 2021-12-04 NOTE — Patient Instructions (Signed)
See assessment and plan under each diagnosis in the problem list and acutely for this visit 

## 2021-12-04 NOTE — Assessment & Plan Note (Signed)
Diabetic control is excellent based on A1c of 6.3%.  No change indicated unless she is experiencing hypoglycemia. ?

## 2021-12-04 NOTE — Assessment & Plan Note (Addendum)
Blood pressure controlled; in fact it is slightly soft.  If pressures remain in this range; the antihypertensive regimen can be weaned slowly with monitor.

## 2021-12-10 ENCOUNTER — Other Ambulatory Visit: Payer: Self-pay | Admitting: Adult Health

## 2021-12-10 MED ORDER — LORAZEPAM 0.5 MG PO TABS: 0.2500 mg | ORAL_TABLET | Freq: Every day | ORAL | 0 refills | Status: DC

## 2022-01-05 IMAGING — CT CT HEAD W/O CM
3 series · 15 of 47 positions shown, 18 images · non-contrast
Comparison: Head CT 03/14/2020

CLINICAL DATA: Headache and neck pain since fall in February 2020.

EXAM:
CT HEAD WITHOUT CONTRAST
TECHNIQUE: Contiguous axial images were obtained from the base of the skull
through the vertex without intravenous contrast.

[Series 3: head w o · axial · 0.41mm/px · z∈[-3,+122]mm · 9 of 31 slices shown, 12 images]
[im 3/31  brain]
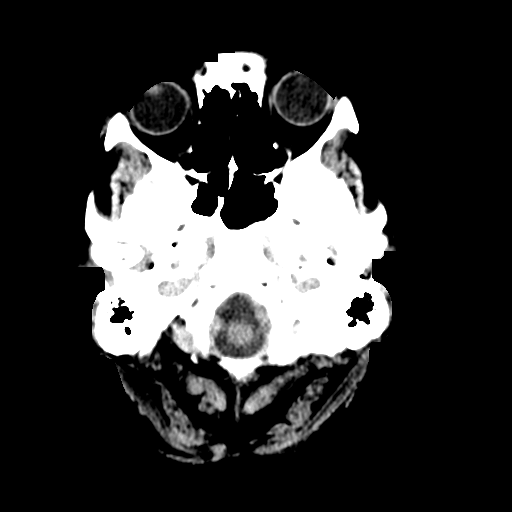
[im 3/31  bone]
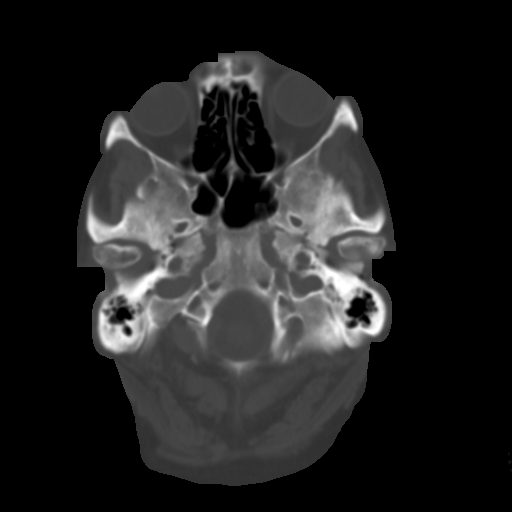
[im 6/31  brain]
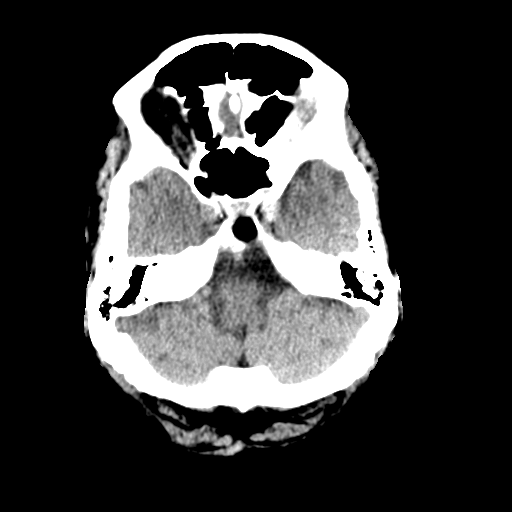
[im 9/31  brain]
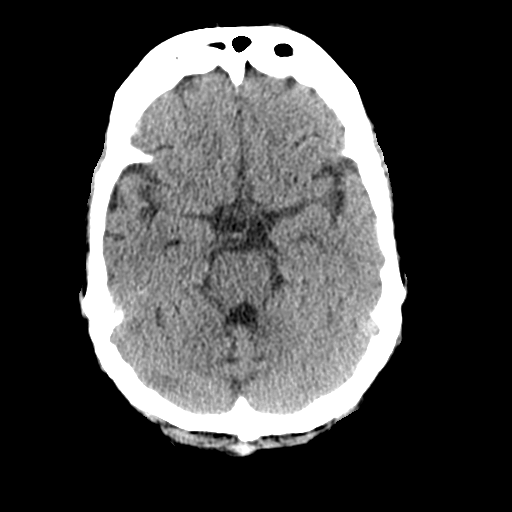
[im 12/31  brain]
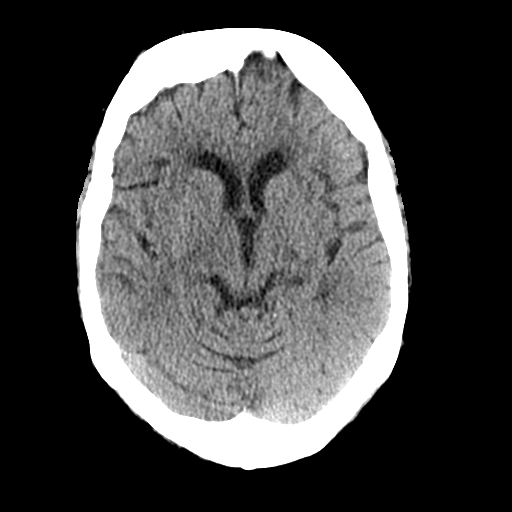
[im 16/31  brain]
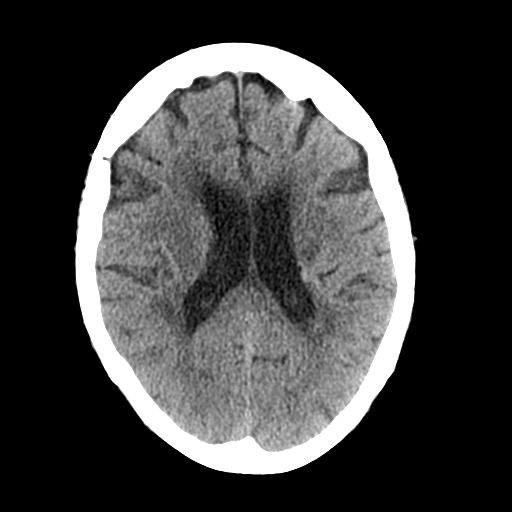
[im 16/31  bone]
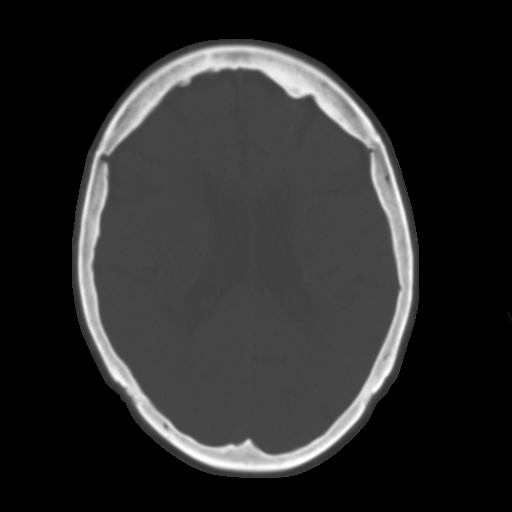
[im 19/31  brain]
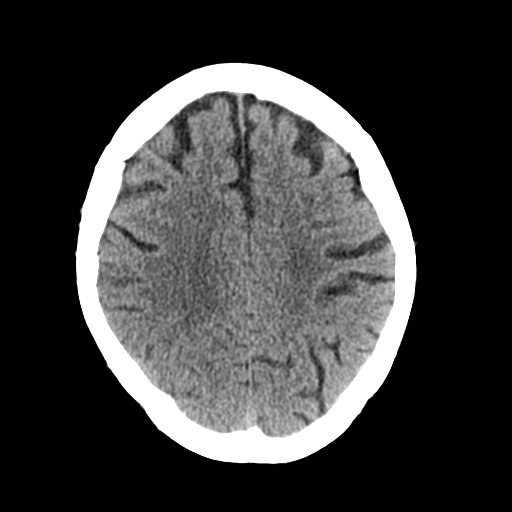
[im 22/31  brain]
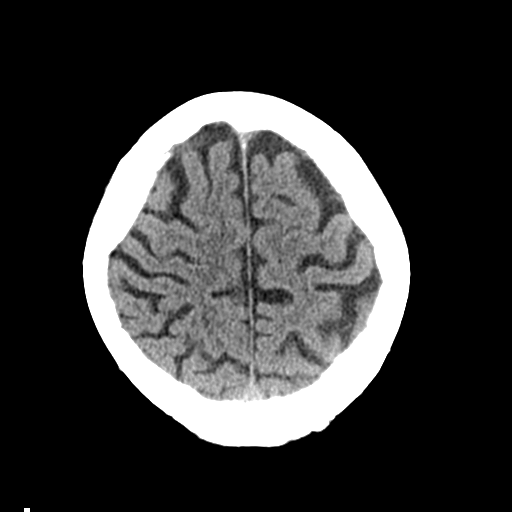
[im 25/31  brain]
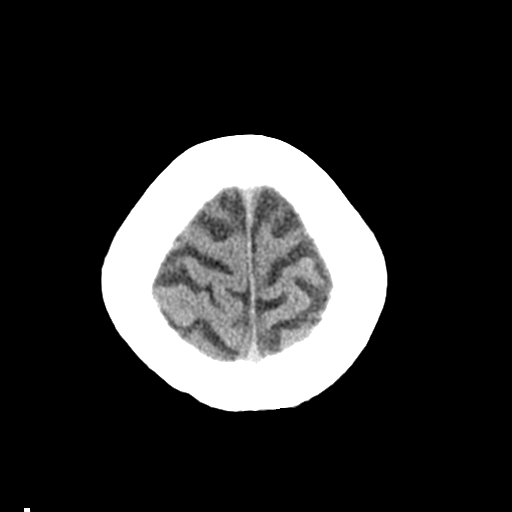
[im 28/31  brain]
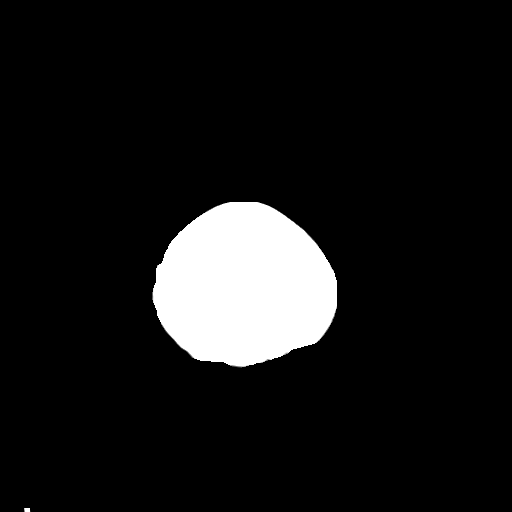
[im 28/31  bone]
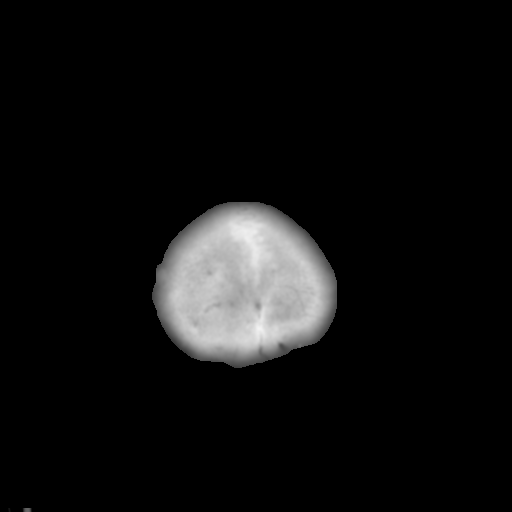

[Series 5: coronal soft · coronal · 0.30mm/px · 3 of 83 slices shown]
[im 28/83  brain]
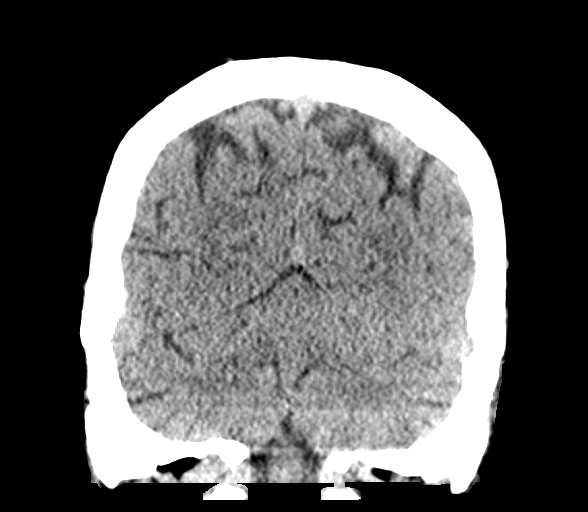
[im 37/83  brain]
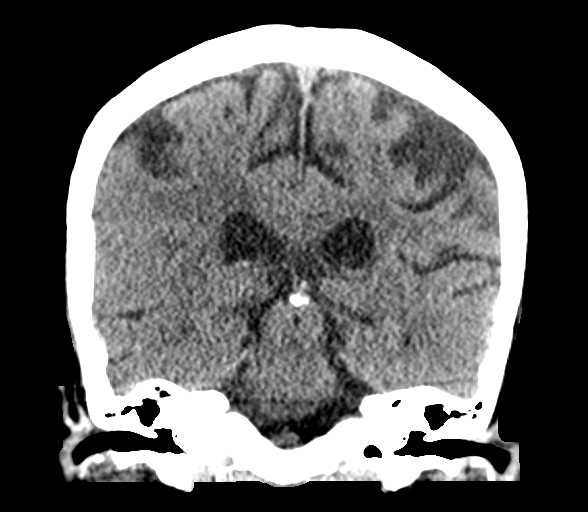
[im 46/83  brain]
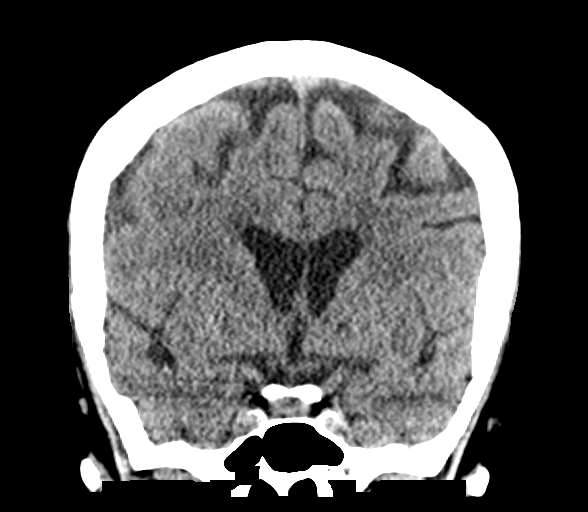

[Series 6: sagittal soft · sagittal · 0.32mm/px · 3 of 59 slices shown]
[im 20/59  brain]
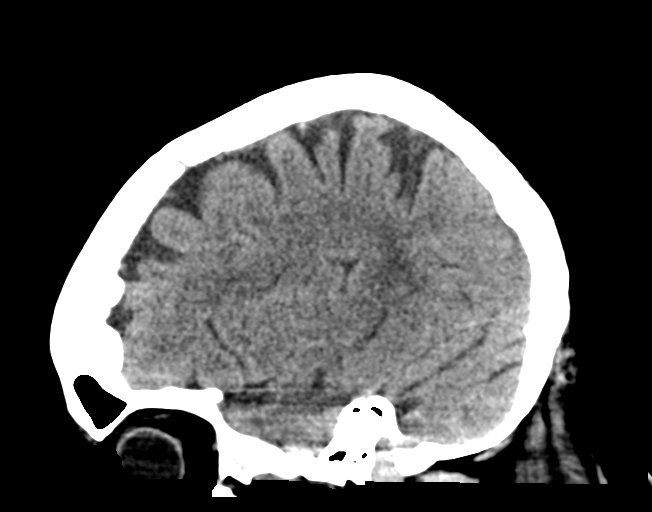
[im 30/59  brain]
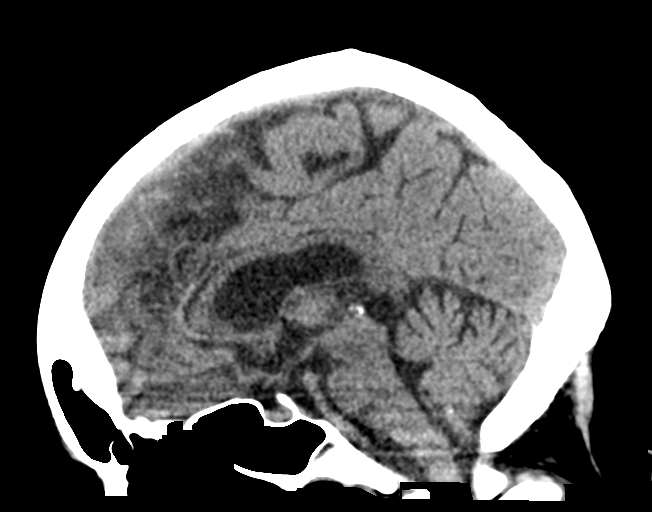
[im 39/59  brain]
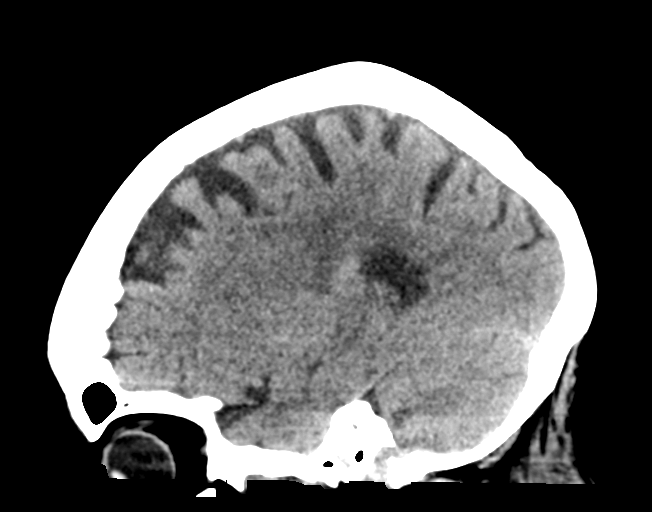

[15 of 47 positions shown; findings below may reference images not displayed]

FINDINGS: Brain: Stable degree of atrophy and chronic small vessel ischemia.
Minimal encephalomalacia in the posterior limb of the left internal
capsule and left thalamus. Small lacunar infarct in left basal
ganglia, unchanged. No intracranial hemorrhage, mass effect, or
midline shift. No hydrocephalus. The basilar cisterns are patent. No
evidence of territorial infarct or acute ischemia. No extra-axial or
intracranial fluid collection.

Vascular: Atherosclerosis of skullbase vasculature without
hyperdense vessel or abnormal calcification.

Skull: No fracture or focal lesion.

Sinuses/Orbits: Paranasal sinuses and mastoid air cells are clear.
The visualized orbits are unremarkable.

Other: None.
IMPRESSION: 1. No explanation for headache.  No posttraumatic sequela.
2. Stable atrophy and chronic small vessel ischemia. Unchanged
remote lacunar infarcts in the left basal ganglia and
encephalomalacia in the posterior limb of the left internal capsule.

## 2022-01-07 ENCOUNTER — Non-Acute Institutional Stay (SKILLED_NURSING_FACILITY): Payer: Medicare Other | Admitting: Adult Health

## 2022-01-07 ENCOUNTER — Encounter: Payer: Self-pay | Admitting: Adult Health

## 2022-01-07 DIAGNOSIS — I69391 Dysphagia following cerebral infarction: Secondary | ICD-10-CM

## 2022-01-07 DIAGNOSIS — E1169 Type 2 diabetes mellitus with other specified complication: Secondary | ICD-10-CM

## 2022-01-07 DIAGNOSIS — F01518 Vascular dementia, unspecified severity, with other behavioral disturbance: Secondary | ICD-10-CM | POA: Diagnosis not present

## 2022-01-07 DIAGNOSIS — E785 Hyperlipidemia, unspecified: Secondary | ICD-10-CM | POA: Diagnosis not present

## 2022-01-07 NOTE — Progress Notes (Signed)
Location:  [facility] Room Number: 123-P Place of Service:  SNF (31)   CODE STATUS: DNR  Allergies  Allergen Reactions   Contrast Media [Iodinated Contrast Media] Anaphylaxis   Betamethasone Valerate Other (See Comments)    Chief Complaint  Patient presents with   Medical Management of Chronic Issues                      Vascular dementia with behavioral disturbance  Dysphagia due to CVA: Dyslipidemia associated with type 2 diabetes mellitus    HPI:  She is a 88 year old long term resident of this facility being seen for the management of her chronic illnesses:   Vascular dementia with behavioral disturbance  Dysphagia due to CVA: Dyslipidemia associated with type 2 diabetes mellitus.  There are no reports of uncontrolled pain present. She does have a good appetite; her weight is stable.   Past Medical History:  Diagnosis Date   Arthritis    Coronary artery disease    angina   Diabetes mellitus with vascular complications    History of CVA (cerebrovascular accident)    Hypertension     Past Surgical History:  Procedure Laterality Date   APPENDECTOMY     CHOLECYSTECTOMY      Social History   Socioeconomic History   Marital status: Widowed    Spouse name: Not on file   Number of children: Not on file   Years of education: Not on file   Highest education level: Not on file  Occupational History   Not on file  Tobacco Use   Smoking status: Never   Smokeless tobacco: Never  Vaping Use   Vaping Use: Never used  Substance and Sexual Activity   Alcohol use: No   Drug use: No   Sexual activity: Not on file  Other Topics Concern   Not on file  Social History Narrative   Not on file   Social Determinants of Health   Financial Resource Strain: Not on file  Food Insecurity: Not on file  Transportation Needs: Not on file  Physical Activity: Not on file  Stress: Not on file  Social Connections: Not on file  Intimate Partner Violence:  Not on file   History reviewed. No pertinent family history.    VITAL SIGNS BP 132/78   Pulse 74   Temp (!) 97 F (36.1 C)   Ht 5\' 4"  (1.626 m)   SpO2 (!) 22%   BMI 28.15 kg/m   Outpatient Encounter Medications as of 01/07/2022  Medication Sig   acetaminophen (TYLENOL) 325 MG tablet Take 650 mg by mouth every 6 (six) hours as needed.    amLODipine (NORVASC) 10 MG tablet Take 1 tablet (10 mg total) by mouth daily.   atorvastatin (LIPITOR) 20 MG tablet Take 1 tablet (20 mg total) by mouth daily at 6 PM.   Balsam [place]-Castor Oil (VENELEX) OINT Apply topically. Apply to sacrum and bilateral buttocks qshift for prevention.   busPIRone (BUSPAR) 7.5 MG tablet Take 7.5 mg by mouth 2 (two) times daily. For Anxiety   lisinopril (ZESTRIL) 10 MG tablet Take 10 mg by mouth daily.   LORazepam (ATIVAN) 0.5 MG tablet Take 0.5 tablets (0.25 mg total) by mouth at bedtime. Give at bedtime   melatonin 3 MG TABS tablet Take 3 mg by mouth at bedtime.   metFORMIN (GLUCOPHAGE) 500 MG tablet Take 1 tablet by mouth 2 (two) times daily.   miconazole (ZEASORB-AF) 2 %  powder small amt; topical,As Needed Special Instructions: prn as needed under abd folds/under arms or groin   NON FORMULARY Diet Change: Regular   [DISCONTINUED] Camphor-Menthol-Methyl Sal (SALONPAS) 3.07-27-08 % PTCH Apply topically. 1 patch, topical, Twice A Day, apply on Right foot,right forearm,and Left Shoulder and neck in the AM and remove in the PM for pain management   No facility-administered encounter medications on file as of 01/07/2022.     SIGNIFICANT DIAGNOSTIC EXAMS  PREVIOUS   02-03-20: DEXA t score -1.009   NO NEW EXAMS.   LABS REVIEWED PREVIOUS   only one blood draw yearly.  02-02-21: d-dimer 1.80 04-11-21: wbc 8.7; hgb 14.2; hct 43.6; mcv 94.0 plt 217; glucose 143; bun 16; creat 0.60; k+ 4.0; na++ 135; ca 9.0 GFR>60; hgb a1c 6.3; chol 115; ldl 43; trig 134; hdl 47; tsh 1.386 vit B 12: 261 vit D 29.23  NO NEW LABS.    Review of Systems  Constitutional:  Negative for malaise/fatigue.  Respiratory:  Negative for cough and shortness of breath.   Cardiovascular:  Negative for chest pain, palpitations and leg swelling.  Gastrointestinal:  Negative for abdominal pain, constipation and heartburn.  Musculoskeletal:  Negative for back pain, joint pain and myalgias.  Skin: Negative.   Neurological:  Negative for dizziness.  Psychiatric/Behavioral:  The patient is not nervous/anxious.    Physical Exam Constitutional:      General: She is not in acute distress.    Appearance: She is well-developed. She is not diaphoretic.  Neck:     Thyroid: No thyromegaly.  Cardiovascular:     Rate and Rhythm: Normal rate and regular rhythm.     Pulses: Normal pulses.     Heart sounds: Normal heart sounds.  Pulmonary:     Effort: Pulmonary effort is normal. No respiratory distress.     Breath sounds: Normal breath sounds.  Abdominal:     General: Bowel sounds are normal. There is no distension.     Palpations: Abdomen is soft.     Tenderness: There is no abdominal tenderness.  Musculoskeletal:     Cervical back: Neck supple.     Right lower leg: No edema.     Left lower leg: No edema.     Comments: Right hemiplegia with contractures.   Lymphadenopathy:     Cervical: No cervical adenopathy.  Skin:    General: Skin is warm and dry.  Neurological:     Mental Status: She is alert. Mental status is at baseline.  Psychiatric:        Mood and Affect: Mood normal.       ASSESSMENT/ PLAN:  TODAY  Vascular dementia with behavioral disturbance is stable weight is 164 pounds; no reports of behavioral issues recently   2. Dysphagia due to CVA: no signs of aspiration present.   3. Dyslipidemia associated with type 2 diabetes mellitus; ldl 43 will continue lipitor 20 mg daily    PREVIOUS   4. Chronic constipation: is stable will continue senna s twice daily   5. Hypertension associated with type 2 diabetes  mellitus is stable b/p 130/62 will continue lisinopril 10 mg daily and norvasc 10 mg daily   6. Type 2 diabetes mellitus with peripheral artery disease: hgb a1c 6.3 will continue metformin 500 mg twice daily is on ace and statin  7. Major depression with psychotic features: will continue ativan 0.25 mg nightly (failed one wean); buspar 7.5 mg twice daily is off zoloft per her request.   8. IVH (  intraventricular hemorrhage) / hemiplegia right dominant side as late effect cerebral infarction unspecified hemiplegia type: has contractures will monitor     Synthia Innocent NP [facility]  call [phone]

## 2022-01-08 ENCOUNTER — Other Ambulatory Visit: Payer: Self-pay | Admitting: Adult Health

## 2022-01-08 MED ORDER — LORAZEPAM 0.5 MG PO TABS: 0.2500 mg | ORAL_TABLET | Freq: Every day | ORAL | 0 refills | Status: DC

## 2022-01-11 ENCOUNTER — Encounter: Payer: Self-pay | Admitting: Adult Health

## 2022-01-11 ENCOUNTER — Non-Acute Institutional Stay (SKILLED_NURSING_FACILITY): Payer: Medicare Other | Admitting: Adult Health

## 2022-01-11 DIAGNOSIS — F01518 Vascular dementia, unspecified severity, with other behavioral disturbance: Secondary | ICD-10-CM

## 2022-01-11 DIAGNOSIS — I152 Hypertension secondary to endocrine disorders: Secondary | ICD-10-CM

## 2022-01-11 DIAGNOSIS — I69351 Hemiplegia and hemiparesis following cerebral infarction affecting right dominant side: Secondary | ICD-10-CM | POA: Diagnosis not present

## 2022-01-11 DIAGNOSIS — E1159 Type 2 diabetes mellitus with other circulatory complications: Secondary | ICD-10-CM | POA: Diagnosis not present

## 2022-02-07 ENCOUNTER — Other Ambulatory Visit: Payer: Self-pay | Admitting: Adult Health

## 2022-02-07 ENCOUNTER — Encounter: Payer: Self-pay | Admitting: Adult Health

## 2022-02-07 ENCOUNTER — Non-Acute Institutional Stay (SKILLED_NURSING_FACILITY): Payer: Medicare Other | Admitting: Adult Health

## 2022-02-07 DIAGNOSIS — I615 Nontraumatic intracerebral hemorrhage, intraventricular: Secondary | ICD-10-CM

## 2022-02-07 DIAGNOSIS — I152 Hypertension secondary to endocrine disorders: Secondary | ICD-10-CM

## 2022-02-07 DIAGNOSIS — K5909 Other constipation: Secondary | ICD-10-CM

## 2022-02-07 DIAGNOSIS — E1159 Type 2 diabetes mellitus with other circulatory complications: Secondary | ICD-10-CM

## 2022-02-07 DIAGNOSIS — E1151 Type 2 diabetes mellitus with diabetic peripheral angiopathy without gangrene: Secondary | ICD-10-CM

## 2022-02-07 MED ORDER — LORAZEPAM 0.5 MG PO TABS: 0.2500 mg | ORAL_TABLET | Freq: Every day | ORAL | 0 refills | Status: DC

## 2022-02-07 NOTE — Progress Notes (Signed)
Location:  [facility] Room Number: 123-P Place of Service:  SNF (31)   CODE STATUS: DNR  Allergies  Allergen Reactions   Contrast Media [Iodinated Contrast Media] Anaphylaxis   Betamethasone Valerate Other (See Comments)    Chief Complaint  Patient presents with   Medical Management of Chronic Issues                                     Chronic constipation:  Hypertension associated with type 2 diabetes mellitus:  Type 2 diabetes mellitus with peripheral artery disease    HPI:  She is a 88 year old long term resident of this facility being seen for the management of her chronic illnesses; Chronic constipation:  Hypertension associated with type 2 diabetes mellitus:  Type 2 diabetes mellitus with peripheral artery disease. There are no reports of uncontrolled pain. There are no reports of anxiety or agitation present.   Past Medical History:  Diagnosis Date   Arthritis    Coronary artery disease    angina   Diabetes mellitus with vascular complications    History of CVA (cerebrovascular accident)    Hypertension     Past Surgical History:  Procedure Laterality Date   APPENDECTOMY     CHOLECYSTECTOMY      Social History   Socioeconomic History   Marital status: Widowed    Spouse name: Not on file   Number of children: Not on file   Years of education: Not on file   Highest education level: Not on file  Occupational History   Not on file  Tobacco Use   Smoking status: Never   Smokeless tobacco: Never  Vaping Use   Vaping Use: Never used  Substance and Sexual Activity   Alcohol use: No   Drug use: No   Sexual activity: Not on file  Other Topics Concern   Not on file  Social History Narrative   Not on file   Social Determinants of Health   Financial Resource Strain: Not on file  Food Insecurity: Not on file  Transportation Needs: Not on file  Physical Activity: Not on file  Stress: Not on file  Social Connections: Not on file   Intimate Partner Violence: Not on file   History reviewed. No pertinent family history.    VITAL SIGNS BP 128/61   Pulse 66   Temp 97.7 F (36.5 C)   Resp 16   Ht 5\' 4"  (1.626 m)   Wt 164 lb (74.4 kg)   SpO2 94%   BMI 28.15 kg/m   Outpatient Encounter Medications as of 02/07/2022  Medication Sig   acetaminophen (TYLENOL) 325 MG tablet Take 650 mg by mouth every 6 (six) hours as needed.    amLODipine (NORVASC) 10 MG tablet Take 1 tablet (10 mg total) by mouth daily.   Balsam [place]-Castor Oil (VENELEX) OINT Apply topically. Apply to sacrum and bilateral buttocks qshift for prevention.   busPIRone (BUSPAR) 7.5 MG tablet Take 7.5 mg by mouth 2 (two) times daily. For Anxiety   lisinopril (ZESTRIL) 10 MG tablet Take 10 mg by mouth daily.   LORazepam (ATIVAN) 0.5 MG tablet Take 0.5 tablets (0.25 mg total) by mouth at bedtime. Give at bedtime   melatonin 3 MG TABS tablet Take 3 mg by mouth at bedtime.   metFORMIN (GLUCOPHAGE) 500 MG tablet Take 1 tablet by mouth 2 (two) times daily.  NON FORMULARY Diet Change: Regular   miconazole (ZEASORB-AF) 2 % powder small amt; topical,As Needed Special Instructions: prn as needed under abd folds/under arms or groin   [DISCONTINUED] atorvastatin (LIPITOR) 20 MG tablet Take 1 tablet (20 mg total) by mouth daily at 6 PM.   No facility-administered encounter medications on file as of 02/07/2022.     SIGNIFICANT DIAGNOSTIC EXAMS  PREVIOUS   02-03-20: DEXA t score -1.009   NO NEW EXAMS.   LABS REVIEWED PREVIOUS   only one blood draw yearly.  02-02-21: d-dimer 1.80 04-11-21: wbc 8.7; hgb 14.2; hct 43.6; mcv 94.0 plt 217; glucose 143; bun 16; creat 0.60; k+ 4.0; na++ 135; ca 9.0 GFR>60; hgb a1c 6.3; chol 115; ldl 43; trig 134; hdl 47; tsh 1.386 vit B 12: 261 vit D 29.23  NO NEW LABS.   Review of Systems  Constitutional:  Negative for malaise/fatigue.  Respiratory:  Negative for cough and shortness of breath.   Cardiovascular:  Negative for  chest pain, palpitations and leg swelling.  Gastrointestinal:  Negative for abdominal pain, constipation and heartburn.  Musculoskeletal:  Negative for back pain, joint pain and myalgias.  Skin: Negative.   Neurological:  Negative for dizziness.  Psychiatric/Behavioral:  The patient is not nervous/anxious.    Physical Exam Constitutional:      General: She is not in acute distress.    Appearance: She is well-developed. She is not diaphoretic.  Neck:     Thyroid: No thyromegaly.  Cardiovascular:     Rate and Rhythm: Normal rate and regular rhythm.     Pulses: Normal pulses.     Heart sounds: Normal heart sounds.  Pulmonary:     Effort: Pulmonary effort is normal. No respiratory distress.     Breath sounds: Normal breath sounds.  Abdominal:     General: Bowel sounds are normal. There is no distension.     Palpations: Abdomen is soft.     Tenderness: There is no abdominal tenderness.  Musculoskeletal:     Cervical back: Neck supple.     Right lower leg: No edema.     Left lower leg: No edema.     Comments:  Right hemiplegia with contractures.   Lymphadenopathy:     Cervical: No cervical adenopathy.  Skin:    General: Skin is warm and dry.  Neurological:     Mental Status: She is alert. Mental status is at baseline.  Psychiatric:        Mood and Affect: Mood normal.         ASSESSMENT/ PLAN:  TODAY  Chronic constipation: will continue senna s twice daily   2. Hypertension associated with type 2 diabetes mellitus: b/p 128/61 will continue lisinopril 10 mg daily and norvasc 10 mg daily   3. Type 2 diabetes mellitus with peripheral artery disease: hgb a1c 6.3 will continue metformin 500 mg twice daily is on ace    PREVIOUS   4. Major depression with psychotic features: will continue ativan 0.25 mg nightly (failed one wean); buspar 7.5 mg twice daily is off zoloft per her request.   5. IVH (intraventricular hemorrhage) / hemiplegia right dominant side as late effect  cerebral infarction unspecified hemiplegia type: has contractures will monitor   6. Vascular dementia with behavioral disturbance is stable weight is 164 pounds; no reports of behavioral issues recently   7. Dysphagia due to CVA: no signs of aspiration present.   8. Dyslipidemia associated with type 2 diabetes mellitus; ldl lipitor has been  stopped per her request.     Synthia Innocent NP [facility]  call [phone]

## 2022-03-05 ENCOUNTER — Non-Acute Institutional Stay (SKILLED_NURSING_FACILITY): Payer: Medicare Other | Admitting: Internal Medicine

## 2022-03-05 ENCOUNTER — Encounter: Payer: Self-pay | Admitting: Internal Medicine

## 2022-03-05 DIAGNOSIS — E1151 Type 2 diabetes mellitus with diabetic peripheral angiopathy without gangrene: Secondary | ICD-10-CM

## 2022-03-05 DIAGNOSIS — E1159 Type 2 diabetes mellitus with other circulatory complications: Secondary | ICD-10-CM | POA: Diagnosis not present

## 2022-03-05 DIAGNOSIS — F01518 Vascular dementia, unspecified severity, with other behavioral disturbance: Secondary | ICD-10-CM

## 2022-03-05 DIAGNOSIS — E1169 Type 2 diabetes mellitus with other specified complication: Secondary | ICD-10-CM

## 2022-03-05 DIAGNOSIS — E785 Hyperlipidemia, unspecified: Secondary | ICD-10-CM

## 2022-03-05 DIAGNOSIS — I69351 Hemiplegia and hemiparesis following cerebral infarction affecting right dominant side: Secondary | ICD-10-CM

## 2022-03-05 DIAGNOSIS — Z8673 Personal history of transient ischemic attack (TIA), and cerebral infarction without residual deficits: Secondary | ICD-10-CM | POA: Diagnosis not present

## 2022-03-05 DIAGNOSIS — I152 Hypertension secondary to endocrine disorders: Secondary | ICD-10-CM

## 2022-03-05 NOTE — Assessment & Plan Note (Addendum)
She previously allowed only 1 blood draw all year; this is due next month.  She also declines to take a statin feeling that is not necessary despite her history of diabetes, hypertension, and history of stroke.
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
## 2022-03-05 NOTE — Patient Instructions (Signed)
See assessment and plan under each diagnosis in the problem list and acutely for this visit 

## 2022-03-05 NOTE — Assessment & Plan Note (Addendum)
Currently no statin is listed in the med list.  Tentatively she is to have her lipids rechecked next month. According to the NP she declines to take the statin and previously had agreed to having blood work drawn only once a year.  At this time it appears she is voiding that agreement.
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
## 2022-03-05 NOTE — Assessment & Plan Note (Signed)
BP controlled; no change in antihypertensive medications Renal function will be checked next month.

## 2022-03-05 NOTE — Assessment & Plan Note (Signed)
A1c is checked annually and was recently checked in September 2022.  He was prediabetic at that time at 6.3%.  It is tentatively scheduled to be rechecked next month.

## 2022-03-08 ENCOUNTER — Other Ambulatory Visit: Payer: Self-pay | Admitting: Adult Health

## 2022-03-08 MED ORDER — LORAZEPAM 0.5 MG PO TABS: 0.2500 mg | ORAL_TABLET | Freq: Every day | ORAL | 0 refills | Status: DC

## 2022-03-22 ENCOUNTER — Other Ambulatory Visit: Payer: Self-pay | Admitting: Adult Health

## 2022-03-22 MED ORDER — LORAZEPAM 0.5 MG PO TABS: 0.2500 mg | ORAL_TABLET | Freq: Every day | ORAL | 0 refills | Status: DC

## 2022-04-03 ENCOUNTER — Encounter: Payer: Self-pay | Admitting: Adult Health

## 2022-04-03 ENCOUNTER — Non-Acute Institutional Stay (SKILLED_NURSING_FACILITY): Payer: Medicare Other | Admitting: Adult Health

## 2022-04-03 DIAGNOSIS — F323 Major depressive disorder, single episode, severe with psychotic features: Secondary | ICD-10-CM

## 2022-04-03 DIAGNOSIS — F01518 Vascular dementia, unspecified severity, with other behavioral disturbance: Secondary | ICD-10-CM | POA: Diagnosis not present

## 2022-04-03 DIAGNOSIS — I69351 Hemiplegia and hemiparesis following cerebral infarction affecting right dominant side: Secondary | ICD-10-CM

## 2022-04-03 NOTE — Progress Notes (Signed)
Location:  [facility] Room Number: 123 Place of Service:  SNF (31)   CODE STATUS: dnr   Allergies  Allergen Reactions   Contrast Media [Iodinated Contrast Media] Anaphylaxis   Betamethasone Valerate Other (See Comments)    Chief Complaint  Patient presents with   Medical Management of Chronic Issues                         Major depression with psychotic features:  Hemiplegia right dominant side as let effect cerebral infarction;  Vascular dementia with behavioral disturbance    HPI:  She is a 88 year old long term resident of this facility being seen for the management of her chronic illnesses: Major depression with psychotic features:  Hemiplegia right dominant side as let effect cerebral infarction;  Vascular dementia with behavioral disturbance. She does complain about her contractures; her appetite is without change; her weight is stable.   Past Medical History:  Diagnosis Date   Arthritis    Coronary artery disease    angina   Diabetes mellitus with vascular complications    History of CVA (cerebrovascular accident)    Hypertension     Past Surgical History:  Procedure Laterality Date   APPENDECTOMY     CHOLECYSTECTOMY      Social History   Socioeconomic History   Marital status: Widowed    Spouse name: Not on file   Number of children: Not on file   Years of education: Not on file   Highest education level: Not on file  Occupational History   Not on file  Tobacco Use   Smoking status: Never   Smokeless tobacco: Never  Vaping Use   Vaping Use: Never used  Substance and Sexual Activity   Alcohol use: No   Drug use: No   Sexual activity: Not on file  Other Topics Concern   Not on file  Social History Narrative   Not on file   Social Determinants of Health   Financial Resource Strain: Not on file  Food Insecurity: Not on file  Transportation Needs: Not on file  Physical Activity: Not on file  Stress: Not on file   Social Connections: Not on file  Intimate Partner Violence: Not on file   No family history on file.    VITAL SIGNS BP 119/62   Pulse 66   Temp 98.3 F (36.8 C)   Resp 16   Ht 5\' 4"  (1.626 m)   Wt 164 lb (74.4 kg)   SpO2 96%   BMI 28.15 kg/m   Outpatient Encounter Medications as of 04/03/2022  Medication Sig   acetaminophen (TYLENOL) 325 MG tablet Take 650 mg by mouth every 6 (six) hours as needed.    amLODipine (NORVASC) 10 MG tablet Take 1 tablet (10 mg total) by mouth daily.   Balsam [place]-Castor Oil (VENELEX) OINT Apply topically. Apply to sacrum and bilateral buttocks qshift for prevention.   busPIRone (BUSPAR) 7.5 MG tablet Take 7.5 mg by mouth 2 (two) times daily. For Anxiety   lisinopril (ZESTRIL) 10 MG tablet Take 10 mg by mouth daily.   LORazepam (ATIVAN) 0.5 MG tablet Take 0.5 tablets (0.25 mg total) by mouth at bedtime. Give at bedtime   melatonin 3 MG TABS tablet Take 3 mg by mouth at bedtime.   metFORMIN (GLUCOPHAGE) 500 MG tablet Take 1 tablet by mouth 2 (two) times daily.   miconazole (ZEASORB-AF) 2 % powder small amt;  topical,As Needed Special Instructions: prn as needed under abd folds/under arms or groin   NON FORMULARY Diet Change: Regular   No facility-administered encounter medications on file as of 04/03/2022.     SIGNIFICANT DIAGNOSTIC EXAMS  PREVIOUS   02-03-20: DEXA t score -1.009   NO NEW EXAMS.   LABS REVIEWED PREVIOUS   only one blood draw yearly.   02-02-21: d-dimer 1.80 04-11-21: wbc 8.7; hgb 14.2; hct 43.6; mcv 94.0 plt 217; glucose 143; bun 16; creat 0.60; k+ 4.0; na++ 135; ca 9.0 GFR>60; hgb a1c 6.3; chol 115; ldl 43; trig 134; hdl 47; tsh 1.386 vit B 12: 261 vit D 29.23  NO NEW LABS.   Review of Systems  Constitutional:  Negative for malaise/fatigue.  Respiratory:  Negative for cough and shortness of breath.   Cardiovascular:  Negative for chest pain, palpitations and leg swelling.  Gastrointestinal:  Negative for abdominal pain,  constipation and heartburn.  Musculoskeletal:  Positive for joint pain and myalgias. Negative for back pain.  Skin: Negative.   Neurological:  Negative for dizziness.  Psychiatric/Behavioral:  The patient is not nervous/anxious.    Physical Exam Constitutional:      General: She is not in acute distress.    Appearance: She is well-developed. She is not diaphoretic.  Neck:     Thyroid: No thyromegaly.  Cardiovascular:     Rate and Rhythm: Normal rate and regular rhythm.     Pulses: Normal pulses.     Heart sounds: Normal heart sounds.  Pulmonary:     Effort: Pulmonary effort is normal. No respiratory distress.     Breath sounds: Normal breath sounds.  Abdominal:     General: Bowel sounds are normal. There is no distension.     Palpations: Abdomen is soft.     Tenderness: There is no abdominal tenderness.  Musculoskeletal:     Cervical back: Neck supple.     Right lower leg: No edema.     Left lower leg: No edema.     Comments:  Right hemiplegia with contractures.    Lymphadenopathy:     Cervical: No cervical adenopathy.  Skin:    General: Skin is warm and dry.  Neurological:     Mental Status: She is alert. Mental status is at baseline.  Psychiatric:        Mood and Affect: Mood normal.           ASSESSMENT/ PLAN:  TODAY  Major depression with psychotic features: will continue ativan 0.25 mg nightly (has failed one wean) buspar 7.5 mg twice daily is off zoloft per her request.   2. Hemiplegia right dominant side as let effect cerebral infarction; unspecified hemiplegia type: she does have contracture on right extremities  3. Vascular dementia with behavioral disturbance; weight is stable at 164 pounds; she is due for her annual blood draw.   PREVIOUS   4. Dysphagia due to CVA: no signs of aspiration present.   5. Dyslipidemia associated with type 2 diabetes mellitus; ldl lipitor has been stopped per her request.   6. Chronic constipation: will continue senna s  twice daily   7. Hypertension associated with type 2 diabetes mellitus: b/p 119/62 will continue lisinopril 10 mg daily and norvasc 10 mg daily   8. Type 2 diabetes mellitus with peripheral artery disease: hgb a1c 6.3 will continue metformin 500 mg twice daily is on ace   Will check cbc; cmp hgb a1c lipids    Ok Edwards NP [facility]   call [phone]

## 2022-04-24 ENCOUNTER — Other Ambulatory Visit: Payer: Self-pay | Admitting: Adult Health

## 2022-04-24 MED ORDER — LORAZEPAM 0.5 MG PO TABS: 0.2500 mg | ORAL_TABLET | Freq: Every day | ORAL | 0 refills | Status: DC

## 2022-05-14 ENCOUNTER — Encounter: Payer: Self-pay | Admitting: Adult Health

## 2022-05-14 ENCOUNTER — Non-Acute Institutional Stay (SKILLED_NURSING_FACILITY): Payer: Medicare Other | Admitting: Adult Health

## 2022-05-14 DIAGNOSIS — E785 Hyperlipidemia, unspecified: Secondary | ICD-10-CM

## 2022-05-14 DIAGNOSIS — I69391 Dysphagia following cerebral infarction: Secondary | ICD-10-CM | POA: Diagnosis not present

## 2022-05-14 DIAGNOSIS — K5909 Other constipation: Secondary | ICD-10-CM | POA: Diagnosis not present

## 2022-05-14 DIAGNOSIS — E1169 Type 2 diabetes mellitus with other specified complication: Secondary | ICD-10-CM | POA: Diagnosis not present

## 2022-05-14 NOTE — Progress Notes (Signed)
Location:  [facility] Room Number: 009 Place of Service:  SNF (31)   CODE STATUS: dnr   Allergies  Allergen Reactions   Contrast Media [Iodinated Contrast Media] Anaphylaxis   Betamethasone Valerate Other (See Comments)    Chief Complaint  Patient presents with   Medical Management of Chronic Issues                       Dysphagia due to CVA:  Dyslipidemia associated with type 2 diabetes mellitus: Chronic constipation:    HPI:  She is a 88 year old long term resident of this facility being seen for the management of her chronic illnesses:   Dysphagia due to CVA:  Dyslipidemia associated with type 2 diabetes mellitus: Chronic constipation. She does have chronic pain due there contractures. Her weight is stable her appetite is stable.   Past Medical History:  Diagnosis Date   Arthritis    Coronary artery disease    angina   Diabetes mellitus with vascular complications    History of CVA (cerebrovascular accident)    Hypertension     Past Surgical History:  Procedure Laterality Date   APPENDECTOMY     CHOLECYSTECTOMY      Social History   Socioeconomic History   Marital status: Widowed    Spouse name: Not on file   Number of children: Not on file   Years of education: Not on file   Highest education level: Not on file  Occupational History   Not on file  Tobacco Use   Smoking status: Never   Smokeless tobacco: Never  Vaping Use   Vaping Use: Never used  Substance and Sexual Activity   Alcohol use: No   Drug use: No   Sexual activity: Not on file  Other Topics Concern   Not on file  Social History Narrative   Not on file   Social Determinants of Health   Financial Resource Strain: Not on file  Food Insecurity: Not on file  Transportation Needs: Not on file  Physical Activity: Not on file  Stress: Not on file  Social Connections: Not on file  Intimate Partner Violence: Not on file   No family history on file.    VITAL  SIGNS BP 127/60   Pulse 66   Temp 98 F (36.7 C)   Resp (!) 99   Ht 5\' 4"  (1.626 m)   Wt 164 lb (74.4 kg)   BMI 28.15 kg/m   Outpatient Encounter Medications as of 05/14/2022  Medication Sig   acetaminophen (TYLENOL) 325 MG tablet Take 650 mg by mouth every 6 (six) hours as needed.    amLODipine (NORVASC) 10 MG tablet Take 1 tablet (10 mg total) by mouth daily.   Balsam [place]-Castor Oil (VENELEX) OINT Apply topically. Apply to sacrum and bilateral buttocks qshift for prevention.   busPIRone (BUSPAR) 7.5 MG tablet Take 7.5 mg by mouth 2 (two) times daily. For Anxiety   lisinopril (ZESTRIL) 10 MG tablet Take 10 mg by mouth daily.   LORazepam (ATIVAN) 0.5 MG tablet Take 0.5 tablets (0.25 mg total) by mouth at bedtime. Give at bedtime   melatonin 3 MG TABS tablet Take 3 mg by mouth at bedtime.   metFORMIN (GLUCOPHAGE) 500 MG tablet Take 1 tablet by mouth 2 (two) times daily.   miconazole (ZEASORB-AF) 2 % powder small amt; topical,As Needed Special Instructions: prn as needed under abd folds/under arms or groin   NON  FORMULARY Diet Change: Regular   No facility-administered encounter medications on file as of 05/14/2022.     SIGNIFICANT DIAGNOSTIC EXAMS   PREVIOUS   02-03-20: DEXA t score -1.009   NO NEW EXAMS.   LABS REVIEWED PREVIOUS   declining all lab work   02-02-21: d-dimer 1.80 04-11-21: wbc 8.7; hgb 14.2; hct 43.6; mcv 94.0 plt 217; glucose 143; bun 16; creat 0.60; k+ 4.0; na++ 135; ca 9.0 GFR>60; hgb a1c 6.3; chol 115; ldl 43; trig 134; hdl 47; tsh 1.386 vit B 12: 261 vit D 29.23  NO NEW LABS.   Review of Systems  Constitutional:  Negative for malaise/fatigue.  Respiratory:  Negative for cough and shortness of breath.   Cardiovascular:  Negative for chest pain, palpitations and leg swelling.  Gastrointestinal:  Negative for abdominal pain, constipation and heartburn.  Musculoskeletal:  Positive for joint pain and myalgias. Negative for back pain.  Skin: Negative.    Neurological:  Negative for dizziness.  Psychiatric/Behavioral:  The patient is nervous/anxious.     Physical Exam Constitutional:      General: She is not in acute distress.    Appearance: She is well-developed. She is not diaphoretic.  Neck:     Thyroid: No thyromegaly.  Cardiovascular:     Rate and Rhythm: Normal rate and regular rhythm.     Pulses: Normal pulses.     Heart sounds: Normal heart sounds.  Pulmonary:     Effort: Pulmonary effort is normal. No respiratory distress.     Breath sounds: Normal breath sounds.  Abdominal:     General: Bowel sounds are normal. There is no distension.     Palpations: Abdomen is soft.     Tenderness: There is no abdominal tenderness.  Musculoskeletal:     Cervical back: Neck supple.     Right lower leg: No edema.     Left lower leg: No edema.     Comments:  Right hemiplegia with contractures.    Lymphadenopathy:     Cervical: No cervical adenopathy.  Skin:    General: Skin is warm and dry.  Neurological:     Mental Status: She is alert. Mental status is at baseline.  Psychiatric:        Mood and Affect: Mood normal.       ASSESSMENT/ PLAN:  TODAY  Dysphagia due to CVA: no signs of aspiration;   2. Dyslipidemia associated with type 2 diabetes mellitus: is off lipitor due ot her request   3. Chronic constipation: will continue senna s twice daily   PREVIOUS   4. Hypertension associated with type 2 diabetes mellitus: b/p 127/60 will continue lisinopril 10 mg daily and norvasc 10 mg daily   5. Type 2 diabetes mellitus with peripheral artery disease: hgb a1c 6.3 will continue metformin 500 mg twice daily is on ace   6. Major depression with psychotic features: will continue ativan 0.25 mg nightly (has failed one wean) buspar 7.5 mg twice daily is off zoloft per her request.   7. Hemiplegia right dominant side as let effect cerebral infarction; unspecified hemiplegia type: she does have contracture on right extremities  8.  Vascular dementia with behavioral disturbance; weight is stable at 164 pounds; she is declining all labs     Synthia Innocent NP [facility]   call [phone]

## 2022-05-21 ENCOUNTER — Other Ambulatory Visit: Payer: Self-pay | Admitting: Adult Health

## 2022-05-21 MED ORDER — LORAZEPAM 0.5 MG PO TABS: 0.2500 mg | ORAL_TABLET | Freq: Every day | ORAL | 0 refills | Status: DC

## 2022-06-17 ENCOUNTER — Other Ambulatory Visit: Payer: Self-pay | Admitting: Adult Health

## 2022-06-17 MED ORDER — LORAZEPAM 0.5 MG PO TABS: 0.2500 mg | ORAL_TABLET | Freq: Every day | ORAL | 0 refills | Status: DC

## 2022-06-18 ENCOUNTER — Encounter: Payer: Self-pay | Admitting: Internal Medicine

## 2022-06-18 ENCOUNTER — Non-Acute Institutional Stay (SKILLED_NURSING_FACILITY): Payer: Medicare Other | Admitting: Internal Medicine

## 2022-06-18 DIAGNOSIS — E1159 Type 2 diabetes mellitus with other circulatory complications: Secondary | ICD-10-CM

## 2022-06-18 DIAGNOSIS — E1169 Type 2 diabetes mellitus with other specified complication: Secondary | ICD-10-CM

## 2022-06-18 DIAGNOSIS — E1151 Type 2 diabetes mellitus with diabetic peripheral angiopathy without gangrene: Secondary | ICD-10-CM | POA: Diagnosis not present

## 2022-06-18 DIAGNOSIS — E785 Hyperlipidemia, unspecified: Secondary | ICD-10-CM

## 2022-06-18 DIAGNOSIS — F01518 Vascular dementia, unspecified severity, with other behavioral disturbance: Secondary | ICD-10-CM

## 2022-06-18 DIAGNOSIS — I152 Hypertension secondary to endocrine disorders: Secondary | ICD-10-CM

## 2022-06-18 NOTE — Assessment & Plan Note (Addendum)
LDL in 03/2021 was @ goal while on a statin , but unfortunately she refused to continue it. Despite cns CT findings , I tried to discuss with her ; she denies having had a CVA.

## 2022-06-18 NOTE — Patient Instructions (Signed)
See assessment and plan under each diagnosis in the problem list and acutely for this visit 

## 2022-06-18 NOTE — Assessment & Plan Note (Signed)
BP controlled; no change in antihypertensive medications  

## 2022-06-18 NOTE — Assessment & Plan Note (Addendum)
Assessment of diabetic control is not current as she refuses lab draws. Control in 03/2021 was excellent as documented by a  A1c of 6.3%.  This was improved from a prior value of 6.9% in May of 2022. She is compliant with metformin.
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
## 2022-07-05 ENCOUNTER — Encounter: Payer: Self-pay | Admitting: Adult Health

## 2022-07-05 ENCOUNTER — Non-Acute Institutional Stay (SKILLED_NURSING_FACILITY): Payer: Medicare Other | Admitting: Adult Health

## 2022-07-05 DIAGNOSIS — I69351 Hemiplegia and hemiparesis following cerebral infarction affecting right dominant side: Secondary | ICD-10-CM

## 2022-07-05 DIAGNOSIS — I152 Hypertension secondary to endocrine disorders: Secondary | ICD-10-CM | POA: Diagnosis not present

## 2022-07-05 DIAGNOSIS — F01518 Vascular dementia, unspecified severity, with other behavioral disturbance: Secondary | ICD-10-CM

## 2022-07-05 DIAGNOSIS — E1159 Type 2 diabetes mellitus with other circulatory complications: Secondary | ICD-10-CM | POA: Diagnosis not present

## 2022-07-05 NOTE — Progress Notes (Signed)
Location:  [facility] Room Number: NO/123/P Place of Service:  SNF (31) Synthia Innocent S.,NP  CODE STATUS: DNR  Allergies  Allergen Reactions   Contrast Media [Iodinated Contrast Media] Anaphylaxis   Betamethasone Valerate Other (See Comments)    Chief Complaint  Patient presents with   Acute Visit    Patient is being seen for care plan management    HPI:  We have come together for her care plan meeting. BIMS 13/15 mood 3/30: nervous at times. Spends all of her time in bed with no falls. She is dependent assist for her adl care. She is incontinent of bladder and bowel. Dietary: weight is 164 pounds (declines weights) regular diet appetite 50-100% feeds self after setup. Therapy: none at this time. Activities: one on one. 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 unspecified hemiplegia type   Past Medical History:  Diagnosis Date   Arthritis    Coronary artery disease    angina   Diabetes mellitus with vascular complications    History of CVA (cerebrovascular accident)    Hypertension     Past Surgical History:  Procedure Laterality Date   APPENDECTOMY     CHOLECYSTECTOMY      Social History   Socioeconomic History   Marital status: Widowed    Spouse name: Not on file   Number of children: Not on file   Years of education: Not on file   Highest education level: Not on file  Occupational History   Not on file  Tobacco Use   Smoking status: Never   Smokeless tobacco: Never  Vaping Use   Vaping Use: Never used  Substance and Sexual Activity   Alcohol use: No   Drug use: No   Sexual activity: Not on file  Other Topics Concern   Not on file  Social History Narrative   Not on file   Social Determinants of Health   Financial Resource Strain:  Not on file  Food Insecurity: Not on file  Transportation Needs: Not on file  Physical Activity: Not on file  Stress: Not on file  Social Connections: Not on file  Intimate Partner Violence: Not on file   History reviewed. No pertinent family history.    VITAL SIGNS BP 134/84   Pulse 69   Temp (!) 97.2 F (36.2 C)   Resp 16   Ht 5\' 4"  (1.626 m)   Wt 164 lb (74.4 kg)   SpO2 94%   BMI 28.15 kg/m   Outpatient Encounter Medications as of 07/05/2022  Medication Sig   acetaminophen (TYLENOL) 325 MG tablet Take 650 mg by mouth every 6 (six) hours as needed.    amLODipine (NORVASC) 10 MG tablet Take 1 tablet (10 mg total) by mouth daily.   Balsam [place]-Castor Oil (VENELEX) OINT Apply topically. Apply to sacrum and bilateral buttocks qshift for prevention.   busPIRone (BUSPAR) 7.5 MG tablet Take 7.5 mg by mouth 2 (two) times daily. For Anxiety   lisinopril (ZESTRIL) 10 MG tablet Take 10 mg by mouth daily.   LORazepam (ATIVAN) 0.5 MG tablet Take 0.5 tablets (0.25 mg total) by mouth at bedtime. Give at bedtime   melatonin 3 MG TABS tablet Take 3 mg by mouth at bedtime.   metFORMIN (GLUCOPHAGE) 500 MG tablet Take 1 tablet by mouth 2 (two) times daily.  miconazole (ZEASORB-AF) 2 % powder small amt; topical,As Needed Special Instructions: prn as needed under abd folds/under arms or groin   NON FORMULARY Diet Change: Regular   No facility-administered encounter medications on file as of 07/05/2022.     SIGNIFICANT DIAGNOSTIC EXAMS  PREVIOUS   02-03-20: DEXA t score -1.009   NO NEW EXAMS.   LABS REVIEWED PREVIOUS   declining all lab work   02-02-21: d-dimer 1.80 04-11-21: wbc 8.7; hgb 14.2; hct 43.6; mcv 94.0 plt 217; glucose 143; bun 16; creat 0.60; k+ 4.0; na++ 135; ca 9.0 GFR>60; hgb a1c 6.3; chol 115; ldl 43; trig 134; hdl 47; tsh 1.386 vit B 12: 261 vit D 29.23  NO NEW LABS.    Review of Systems  Constitutional:  Negative for malaise/fatigue.  Respiratory:  Negative for  cough and shortness of breath.   Cardiovascular:  Negative for chest pain, palpitations and leg swelling.  Gastrointestinal:  Negative for abdominal pain, constipation and heartburn.  Musculoskeletal:  Positive for myalgias. Negative for back pain and joint pain.  Skin: Negative.   Neurological:  Negative for dizziness.  Psychiatric/Behavioral:  The patient is not nervous/anxious.    Physical Exam Constitutional:      General: She is not in acute distress.    Appearance: She is well-developed. She is not diaphoretic.  Neck:     Thyroid: No thyromegaly.  Cardiovascular:     Rate and Rhythm: Normal rate and regular rhythm.     Pulses: Normal pulses.     Heart sounds: Normal heart sounds.  Pulmonary:     Effort: Pulmonary effort is normal. No respiratory distress.     Breath sounds: Normal breath sounds.  Abdominal:     General: Bowel sounds are normal. There is no distension.     Palpations: Abdomen is soft.     Tenderness: There is no abdominal tenderness.  Musculoskeletal:     Cervical back: Neck supple.     Right lower leg: No edema.     Left lower leg: No edema.     Comments: Right hemiplegia with contractures.   Lymphadenopathy:     Cervical: No cervical adenopathy.  Skin:    General: Skin is warm and dry.  Neurological:     Mental Status: She is alert. Mental status is at baseline.  Psychiatric:        Mood and Affect: Mood normal.       ASSESSMENT/ PLAN:  TODAY  Hypertension associated with type 2 diabetes mellitus Vascular dementia with behavioral disturbance Hemiplegia of right dominant side as late effect of cerebral infarction unspecified hemiplegia type   Will continue current medications Will continue current plan of care Will continue to monitor her status   Time spent with patient: 40 minutes: medications; goals of care; dietary     Synthia Innocent NP [facility]   call [phone]

## 2022-07-10 ENCOUNTER — Other Ambulatory Visit: Payer: Self-pay | Admitting: Adult Health

## 2022-07-10 MED ORDER — LORAZEPAM 0.5 MG PO TABS: 0.2500 mg | ORAL_TABLET | Freq: Every day | ORAL | 0 refills | Status: DC

## 2022-07-16 ENCOUNTER — Encounter: Payer: Self-pay | Admitting: Adult Health

## 2022-07-16 ENCOUNTER — Non-Acute Institutional Stay (SKILLED_NURSING_FACILITY): Payer: Medicare Other | Admitting: Adult Health

## 2022-07-16 DIAGNOSIS — I152 Hypertension secondary to endocrine disorders: Secondary | ICD-10-CM | POA: Diagnosis not present

## 2022-07-16 DIAGNOSIS — E1159 Type 2 diabetes mellitus with other circulatory complications: Secondary | ICD-10-CM | POA: Diagnosis not present

## 2022-07-16 DIAGNOSIS — F323 Major depressive disorder, single episode, severe with psychotic features: Secondary | ICD-10-CM | POA: Diagnosis not present

## 2022-07-16 DIAGNOSIS — E1151 Type 2 diabetes mellitus with diabetic peripheral angiopathy without gangrene: Secondary | ICD-10-CM | POA: Diagnosis not present

## 2022-07-16 NOTE — Progress Notes (Signed)
Location:  [facility] Room Number: 123-P Place of Service:  SNF (31)   CODE STATUS: DNR  Allergies  Allergen Reactions   Contrast Media [Iodinated Contrast Media] Anaphylaxis   Betamethasone Valerate Other (See Comments)    Chief Complaint  Patient presents with   Medical Management of Chronic Issues                  Hypertension associated with type 2 diabetes mellitus:  Type 2 diabetes mellitus with peripheral arterial artery disease: Major depression with psychotic features    HPI:  She is a 88 year old long term resident of this facility being seen for the management of her chronic illnesses:   Hypertension associated with type 2 diabetes mellitus:  Type 2 diabetes mellitus with peripheral arterial artery disease: Major depression with psychotic features. She spends all of her time in bed; there are no reports of uncontrolled pain. No reports of depressive thoughts.   Past Medical History:  Diagnosis Date   Arthritis    Coronary artery disease    angina   Diabetes mellitus with vascular complications    History of CVA (cerebrovascular accident)    Hypertension     Past Surgical History:  Procedure Laterality Date   APPENDECTOMY     CHOLECYSTECTOMY      Social History   Socioeconomic History   Marital status: Widowed    Spouse name: Not on file   Number of children: Not on file   Years of education: Not on file   Highest education level: Not on file  Occupational History   Not on file  Tobacco Use   Smoking status: Never   Smokeless tobacco: Never  Vaping Use   Vaping Use: Never used  Substance and Sexual Activity   Alcohol use: No   Drug use: No   Sexual activity: Not on file  Other Topics Concern   Not on file  Social History Narrative   Not on file   Social Determinants of Health   Financial Resource Strain: Not on file  Food Insecurity: Not on file  Transportation Needs: Not on file  Physical Activity: Not on file   Stress: Not on file  Social Connections: Not on file  Intimate Partner Violence: Not on file   History reviewed. No pertinent family history.    VITAL SIGNS BP (!) 125/59   Pulse 67   Temp 98 F (36.7 C)   Resp 20   Ht 5\' 4"  (1.626 m)   Wt 164 lb (74.4 kg)   SpO2 95%   BMI 28.15 kg/m   Outpatient Encounter Medications as of 07/16/2022  Medication Sig   acetaminophen (TYLENOL) 325 MG tablet Take 650 mg by mouth every 6 (six) hours as needed.    amLODipine (NORVASC) 10 MG tablet Take 1 tablet (10 mg total) by mouth daily.   Balsam [place]-Castor Oil (VENELEX) OINT Apply topically. Apply to sacrum and bilateral buttocks qshift for prevention.   busPIRone (BUSPAR) 7.5 MG tablet Take 7.5 mg by mouth 2 (two) times daily. For Anxiety   lisinopril (ZESTRIL) 10 MG tablet Take 10 mg by mouth daily.   LORazepam (ATIVAN) 0.5 MG tablet Take 0.5 tablets (0.25 mg total) by mouth at bedtime. Give at bedtime   melatonin 3 MG TABS tablet Take 3 mg by mouth at bedtime.   metFORMIN (GLUCOPHAGE) 500 MG tablet Take 1 tablet by mouth 2 (two) times daily.   methocarbamol (ROBAXIN) 500 MG tablet  Take 500 mg by mouth 4 (four) times daily.   miconazole (ZEASORB-AF) 2 % powder small amt; topical,As Needed Special Instructions: prn as needed under abd folds/under arms or groin   NON FORMULARY Diet Change: Regular   No facility-administered encounter medications on file as of 07/16/2022.     SIGNIFICANT DIAGNOSTIC EXAMS  PREVIOUS   02-03-20: DEXA t score -1.009   NO NEW EXAMS.   LABS REVIEWED PREVIOUS   declining all lab work   02-02-21: d-dimer 1.80 04-11-21: wbc 8.7; hgb 14.2; hct 43.6; mcv 94.0 plt 217; glucose 143; bun 16; creat 0.60; k+ 4.0; na++ 135; ca 9.0 GFR>60; hgb a1c 6.3; chol 115; ldl 43; trig 134; hdl 47; tsh 1.386 vit B 12: 261 vit D 29.23  NO NEW LABS.   Review of Systems  Constitutional:  Negative for malaise/fatigue.  Respiratory:  Negative for cough and shortness of breath.    Cardiovascular:  Negative for chest pain, palpitations and leg swelling.  Gastrointestinal:  Negative for abdominal pain, constipation and heartburn.  Musculoskeletal:  Negative for back pain, joint pain and myalgias.  Skin: Negative.   Neurological:  Negative for dizziness.  Psychiatric/Behavioral:  The patient is not nervous/anxious.    Physical Exam Constitutional:      General: She is not in acute distress.    Appearance: She is well-developed. She is not diaphoretic.  Neck:     Thyroid: No thyromegaly.  Cardiovascular:     Rate and Rhythm: Normal rate and regular rhythm.     Pulses: Normal pulses.     Heart sounds: Normal heart sounds.  Pulmonary:     Effort: Pulmonary effort is normal. No respiratory distress.     Breath sounds: Normal breath sounds.  Abdominal:     General: Bowel sounds are normal. There is no distension.     Palpations: Abdomen is soft.     Tenderness: There is no abdominal tenderness.  Musculoskeletal:     Cervical back: Neck supple.     Right lower leg: No edema.     Left lower leg: No edema.     Comments: : Right hemiplegia with contractures.    Lymphadenopathy:     Cervical: No cervical adenopathy.  Skin:    General: Skin is warm and dry.  Neurological:     Mental Status: She is alert. Mental status is at baseline.  Psychiatric:        Mood and Affect: Mood normal.         ASSESSMENT/ PLAN:  TODAY  Hypertension associated with type 2 diabetes mellitus: b/p 125/59 will continue lisinopril 10 mg daily and norvasc 10 mg daily   2. Type 2 diabetes mellitus with peripheral arterial artery disease: hgb A1c 6.3 will continue metformin 500 mg twice daily is on ace  3. Major depression with psychotic features: with continue ativan 0.25 mg nighty (has failed one wean); buspar 7.5 mg twice daily is off zoloft per her request.    PREVIOUS   4. Hemiplegia right dominant side as let effect cerebral infarction; unspecified hemiplegia type: she  does have contracture on right extremities  5. Vascular dementia with behavioral disturbance; weight is stable at 164 pounds; she is declining all labs  6. Dysphagia due to CVA: no signs of aspiration;   7. Dyslipidemia associated with type 2 diabetes mellitus: is off lipitor due ot her request   8. Chronic constipation: will continue senna s twice daily     [name] Innocent NP [place] [facility]  call [phone]

## 2022-08-02 ENCOUNTER — Other Ambulatory Visit: Payer: Self-pay | Admitting: Adult Health

## 2022-08-02 MED ORDER — LORAZEPAM 0.5 MG PO TABS: 0.2500 mg | ORAL_TABLET | Freq: Every day | ORAL | 0 refills | Status: DC

## 2022-08-14 ENCOUNTER — Encounter: Payer: Self-pay | Admitting: Adult Health

## 2022-08-14 ENCOUNTER — Non-Acute Institutional Stay (SKILLED_NURSING_FACILITY): Payer: Medicare Other | Admitting: Adult Health

## 2022-08-14 DIAGNOSIS — I69391 Dysphagia following cerebral infarction: Secondary | ICD-10-CM

## 2022-08-14 DIAGNOSIS — I69351 Hemiplegia and hemiparesis following cerebral infarction affecting right dominant side: Secondary | ICD-10-CM | POA: Diagnosis not present

## 2022-08-14 DIAGNOSIS — F01518 Vascular dementia, unspecified severity, with other behavioral disturbance: Secondary | ICD-10-CM

## 2022-08-14 NOTE — Progress Notes (Signed)
Location:  [facility] Room Number: 123 P Place of Service:  SNF (31)   CODE STATUS: DNR  Allergies  Allergen Reactions   Contrast Media [Iodinated Contrast Media] Anaphylaxis   Betamethasone Valerate Other (See Comments)    Chief Complaint  Patient presents with   Medical Management of Chronic Issues                      Hemiplegia right dominant side as late effect cerebral infarction, unspecified hemiplegia type:   Vascular dementia with behavioral disturbance:  Dysphagia due to CVA    HPI:  She is a 89 year old long term resident of this facility being seen for the management of her chronic illnesses;  Hemiplegia right dominant side as late effect cerebral infarction, unspecified hemiplegia type:   Vascular dementia with behavioral disturbance:  Dysphagia due to CVA. She continues to spend all of her time in bed per her choice. She continues to stay fixated on old events that has happened years ago. Her weight is stable.   Past Medical History:  Diagnosis Date   Arthritis    Coronary artery disease    angina   Diabetes mellitus with vascular complications    History of CVA (cerebrovascular accident)    Hypertension     Past Surgical History:  Procedure Laterality Date   APPENDECTOMY     CHOLECYSTECTOMY      Social History   Socioeconomic History   Marital status: Widowed    Spouse name: Not on file   Number of children: Not on file   Years of education: Not on file   Highest education level: Not on file  Occupational History   Not on file  Tobacco Use   Smoking status: Never   Smokeless tobacco: Never  Vaping Use   Vaping Use: Never used  Substance and Sexual Activity   Alcohol use: No   Drug use: No   Sexual activity: Not on file  Other Topics Concern   Not on file  Social History Narrative   Not on file   Social Determinants of Health   Financial Resource Strain: Not on file  Food Insecurity: Not on file  Transportation  Needs: Not on file  Physical Activity: Not on file  Stress: Not on file  Social Connections: Not on file  Intimate Partner Violence: Not on file   History reviewed. No pertinent family history.    VITAL SIGNS BP 128/67   Pulse 67   Temp 98 F (36.7 C)   Resp 20   Ht 5\' 4"  (1.626 m)   Wt 164 lb (74.4 kg)   SpO2 97%   BMI 28.15 kg/m   Outpatient Encounter Medications as of 08/14/2022  Medication Sig   acetaminophen (TYLENOL) 325 MG tablet Take 650 mg by mouth every 6 (six) hours as needed.    amLODipine (NORVASC) 10 MG tablet Take 1 tablet (10 mg total) by mouth daily.   Balsam [place]-Castor Oil (VENELEX) OINT Apply topically. Apply to sacrum and bilateral buttocks qshift for prevention.   busPIRone (BUSPAR) 7.5 MG tablet Take 7.5 mg by mouth 2 (two) times daily. For Anxiety   lisinopril (ZESTRIL) 10 MG tablet Take 10 mg by mouth daily.   LORazepam (ATIVAN) 0.5 MG tablet Take 0.5 tablets (0.25 mg total) by mouth at bedtime. Give at bedtime   melatonin 3 MG TABS tablet Take 3 mg by mouth at bedtime.   metFORMIN (GLUCOPHAGE) 500 MG  tablet Take 1 tablet by mouth 2 (two) times daily.   methocarbamol (ROBAXIN) 500 MG tablet Take 500 mg by mouth every 6 (six) hours as needed.   miconazole (ZEASORB-AF) 2 % powder small amt; topical,As Needed Special Instructions: prn as needed under abd folds/under arms or groin   NON FORMULARY Diet Change: Regular   No facility-administered encounter medications on file as of 08/14/2022.     SIGNIFICANT DIAGNOSTIC EXAMS  PREVIOUS   02-03-20: DEXA t score -1.009   NO NEW EXAMS.   LABS REVIEWED PREVIOUS   declining all lab work   02-02-21: d-dimer 1.80 04-11-21: wbc 8.7; hgb 14.2; hct 43.6; mcv 94.0 plt 217; glucose 143; bun 16; creat 0.60; k+ 4.0; na++ 135; ca 9.0 GFR>60; hgb a1c 6.3; chol 115; ldl 43; trig 134; hdl 47; tsh 1.386 vit B 12: 261 vit D 29.23  NO NEW LABS.   Review of Systems  Constitutional:  Negative for malaise/fatigue.   Respiratory:  Negative for cough and shortness of breath.   Cardiovascular:  Negative for chest pain, palpitations and leg swelling.  Gastrointestinal:  Negative for abdominal pain, constipation and heartburn.  Musculoskeletal:  Positive for back pain, joint pain and myalgias.  Skin: Negative.   Neurological:  Negative for dizziness.  Psychiatric/Behavioral:  The patient is not nervous/anxious.     Physical Exam Constitutional:      General: She is not in acute distress.    Appearance: She is well-developed. She is not diaphoretic.  Neck:     Thyroid: No thyromegaly.  Cardiovascular:     Rate and Rhythm: Normal rate and regular rhythm.     Pulses: Normal pulses.     Heart sounds: Normal heart sounds.  Pulmonary:     Effort: Pulmonary effort is normal. No respiratory distress.     Breath sounds: Normal breath sounds.  Abdominal:     General: Bowel sounds are normal. There is no distension.     Palpations: Abdomen is soft.     Tenderness: There is no abdominal tenderness.  Musculoskeletal:     Cervical back: Neck supple.     Right lower leg: No edema.     Left lower leg: No edema.     Comments:  Right hemiplegia with contractures.    Lymphadenopathy:     Cervical: No cervical adenopathy.  Skin:    General: Skin is warm and dry.  Neurological:     Mental Status: She is alert. Mental status is at baseline.  Psychiatric:        Mood and Affect: Mood normal.       ASSESSMENT/ PLAN:  TODAY  Hemiplegia right dominant side as late effect cerebral infarction, unspecified hemiplegia type: does have contractures present.   2. Vascular dementia with behavioral disturbance: weight is 164 pounds; declines all labs.   3. Dysphagia due to CVA: no signs of aspiration; is on thin liquids.    PREVIOUS   4. Dyslipidemia associated with type 2 diabetes mellitus: is off lipitor due ot her request   5. Chronic constipation: will continue senna s twice daily   6. Hypertension  associated with type 2 diabetes mellitus: b/p 128/67 will continue lisinopril 10 mg daily and norvasc 10 mg daily   7. Type 2 diabetes mellitus with peripheral arterial artery disease: hgb A1c 6.3 will continue metformin 500 mg twice daily is on ace  8. Major depression with psychotic features: with continue ativan 0.25 mg nighty (has failed one wean); buspar 7.5 mg  twice daily is off zoloft per her request.     Ok Edwards NP [facility]  call [phone]

## 2022-09-02 ENCOUNTER — Other Ambulatory Visit: Payer: Self-pay | Admitting: Adult Health

## 2022-09-02 MED ORDER — LORAZEPAM 0.5 MG PO TABS: 0.2500 mg | ORAL_TABLET | Freq: Every day | ORAL | 0 refills | Status: DC

## 2022-09-09 ENCOUNTER — Non-Acute Institutional Stay: Payer: Medicare Other | Admitting: Internal Medicine

## 2022-09-09 ENCOUNTER — Encounter: Payer: Self-pay | Admitting: Internal Medicine

## 2022-09-09 DIAGNOSIS — E782 Mixed hyperlipidemia: Secondary | ICD-10-CM | POA: Diagnosis not present

## 2022-09-09 DIAGNOSIS — I69398 Other sequelae of cerebral infarction: Secondary | ICD-10-CM

## 2022-09-09 DIAGNOSIS — E1169 Type 2 diabetes mellitus with other specified complication: Secondary | ICD-10-CM

## 2022-09-09 DIAGNOSIS — I69351 Hemiplegia and hemiparesis following cerebral infarction affecting right dominant side: Secondary | ICD-10-CM | POA: Diagnosis not present

## 2022-09-09 DIAGNOSIS — F0630 Mood disorder due to known physiological condition, unspecified: Secondary | ICD-10-CM

## 2022-09-09 DIAGNOSIS — E538 Deficiency of other specified B group vitamins: Secondary | ICD-10-CM

## 2022-09-09 DIAGNOSIS — I152 Hypertension secondary to endocrine disorders: Secondary | ICD-10-CM

## 2022-09-09 DIAGNOSIS — E1151 Type 2 diabetes mellitus with diabetic peripheral angiopathy without gangrene: Secondary | ICD-10-CM

## 2022-09-09 DIAGNOSIS — I10 Essential (primary) hypertension: Secondary | ICD-10-CM

## 2022-09-09 DIAGNOSIS — Z8673 Personal history of transient ischemic attack (TIA), and cerebral infarction without residual deficits: Secondary | ICD-10-CM

## 2022-09-09 NOTE — Assessment & Plan Note (Addendum)
BP controlled & is actually soft. No change in antihypertensive medications unless relative hypotension persistant. In that case ACE-I & CCB dose(s) will be reduced.
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
## 2022-09-09 NOTE — Assessment & Plan Note (Signed)
In September 2022 B12 level was low therapeutic.  Update indicated but patient declines.
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
## 2022-09-09 NOTE — Assessment & Plan Note (Signed)
LDL was at goal at of less than 70 when last checked in September 2022.  Because of that value the patient refuses statins stating that her cholesterol is now normal.

## 2022-09-09 NOTE — Assessment & Plan Note (Addendum)
Current status cannot be determined as she refuses labs.  She is compliant with metformin.  The last A1c was prediabetic at 6.3 but this was in September 2022. Monitor FBS as she will allow.

## 2022-09-10 NOTE — Progress Notes (Signed)
This encounter was created in error - please disregard.

## 2022-09-14 NOTE — Assessment & Plan Note (Signed)
I shall share copy of cns imaging & knee with her.& inform her she can have these reviewed by any medical specialist of her choice.

## 2022-09-14 NOTE — Addendum Note (Signed)
Addended byHendricks Limes on: 09/14/2022 07:38 AM   Modules accepted: Level of Service

## 2022-10-01 ENCOUNTER — Other Ambulatory Visit: Payer: Self-pay | Admitting: Adult Health

## 2022-10-01 MED ORDER — LORAZEPAM 0.5 MG PO TABS: 0.2500 mg | ORAL_TABLET | Freq: Every day | ORAL | 0 refills | Status: DC

## 2022-10-04 ENCOUNTER — Non-Acute Institutional Stay (SKILLED_NURSING_FACILITY): Payer: Medicare Other | Admitting: Adult Health

## 2022-10-04 ENCOUNTER — Encounter: Payer: Self-pay | Admitting: Adult Health

## 2022-10-04 DIAGNOSIS — Z91199 Patient's noncompliance with other medical treatment and regimen due to unspecified reason: Secondary | ICD-10-CM | POA: Diagnosis not present

## 2022-10-04 DIAGNOSIS — F01518 Vascular dementia, unspecified severity, with other behavioral disturbance: Secondary | ICD-10-CM

## 2022-10-04 DIAGNOSIS — I69351 Hemiplegia and hemiparesis following cerebral infarction affecting right dominant side: Secondary | ICD-10-CM | POA: Diagnosis not present

## 2022-10-04 DIAGNOSIS — E1151 Type 2 diabetes mellitus with diabetic peripheral angiopathy without gangrene: Secondary | ICD-10-CM | POA: Diagnosis not present

## 2022-10-04 NOTE — Progress Notes (Signed)
Location:  [facility] Room Number: [id number] Place of Service:  SNF (31)   CODE STATUS: dnr  Allergies  Allergen Reactions   Contrast Media [Iodinated Contrast Media] Anaphylaxis   Betamethasone Valerate Other (See Comments)    Chief Complaint  Patient presents with   Acute Visit    Care plan meeting     HPI:  We have come together for her care plan meeting. BIMS 13/15 mood 0/30. She remains in bed per her choice with no falls. She is dependent for her adl care. She is incontinent of bladder and bowel. Dietary: setup for meals. Regular diet appetite 1-50%; does not allow any weights since September. Therapy: none at this time. Activities: declines all attempts. 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, unspecified hemiplegia type Vascular dementia with psychosis  Noncompliance with treatment   Past Medical History:  Diagnosis Date   Arthritis    Coronary artery disease    angina   Diabetes mellitus with vascular complications    History of CVA (cerebrovascular accident)    Hypertension     Past Surgical History:  Procedure Laterality Date   APPENDECTOMY     CHOLECYSTECTOMY      Social History   Socioeconomic History   Marital status: Widowed    Spouse name: Not on file   Number of children: Not on file   Years of education: Not on file   Highest education level: Not on file  Occupational History   Not on file  Tobacco Use   Smoking status: Never   Smokeless tobacco: Never  Vaping Use   Vaping Use: Never used  Substance and Sexual Activity   Alcohol use: No   Drug use: No   Sexual activity: Not on file  Other Topics Concern   Not on file  Social History Narrative   Not on file   Social Determinants of Health   Financial Resource Strain: Not on file  Food Insecurity: Not on  file  Transportation Needs: Not on file  Physical Activity: Not on file  Stress: Not on file  Social Connections: Not on file  Intimate Partner Violence: Not on file   No family history on file.    VITAL SIGNS BP 134/65   Pulse 67   Temp 97.7 F (36.5 C)   Resp 18   Ht 5\' 4"  (1.626 m)   Wt 164 lb (74.4 kg)   SpO2 96%   BMI 28.15 kg/m   Outpatient Encounter Medications as of 10/04/2022  Medication Sig   acetaminophen (TYLENOL) 325 MG tablet Take 650 mg by mouth every 6 (six) hours as needed.    amLODipine (NORVASC) 10 MG tablet Take 1 tablet (10 mg total) by mouth daily.   Balsam [place]-Castor Oil (VENELEX) OINT Apply topically. Apply to sacrum and bilateral buttocks qshift for prevention.   busPIRone (BUSPAR) 7.5 MG tablet Take 7.5 mg by mouth 2 (two) times daily. For Anxiety   lisinopril (ZESTRIL) 10 MG tablet Take 10 mg by mouth daily.   LORazepam (ATIVAN) 0.5 MG tablet Take 0.5 tablets (0.25 mg total) by mouth at bedtime. Give at bedtime   melatonin 3 MG TABS tablet Take 3 mg by mouth at bedtime.   metFORMIN (GLUCOPHAGE) 500 MG tablet Take 1 tablet by mouth 2 (two) times daily.   methocarbamol (ROBAXIN) 500  MG tablet Take 500 mg by mouth every 6 (six) hours as needed.   miconazole (ZEASORB-AF) 2 % powder small amt; topical,As Needed Special Instructions: prn as needed under abd folds/under arms or groin   NON FORMULARY Diet Change: Regular   No facility-administered encounter medications on file as of 10/04/2022.     SIGNIFICANT DIAGNOSTIC EXAMS  PREVIOUS   02-03-20: DEXA t score -1.009   NO NEW EXAMS.   LABS REVIEWED PREVIOUS   declining all lab work   02-02-21: d-dimer 1.80 04-11-21: wbc 8.7; hgb 14.2; hct 43.6; mcv 94.0 plt 217; glucose 143; bun 16; creat 0.60; k+ 4.0; na++ 135; ca 9.0 GFR>60; hgb a1c 6.3; chol 115; ldl 43; trig 134; hdl 47; tsh 1.386 vit B 12: 261 vit D 29.23  NO NEW LABS.   Review of Systems  Constitutional:  Negative for malaise/fatigue.   Respiratory:  Negative for cough and shortness of breath.   Cardiovascular:  Negative for chest pain, palpitations and leg swelling.  Gastrointestinal:  Negative for abdominal pain, constipation and heartburn.  Musculoskeletal:  Positive for back pain, joint pain and myalgias.  Skin: Negative.   Neurological:  Negative for dizziness.  Psychiatric/Behavioral:  The patient is not nervous/anxious.     Physical Exam Constitutional:      General: She is not in acute distress.    Appearance: She is well-developed. She is not diaphoretic.  Neck:     Thyroid: No thyromegaly.  Cardiovascular:     Rate and Rhythm: Normal rate and regular rhythm.     Pulses: Normal pulses.     Heart sounds: Normal heart sounds.  Pulmonary:     Effort: Pulmonary effort is normal. No respiratory distress.     Breath sounds: Normal breath sounds.  Abdominal:     General: Bowel sounds are normal. There is no distension.     Palpations: Abdomen is soft.     Tenderness: There is no abdominal tenderness.  Musculoskeletal:     Cervical back: Neck supple.     Right lower leg: No edema.     Left lower leg: No edema.     Comments:  Right hemiplegia with contractures.   Lymphadenopathy:     Cervical: No cervical adenopathy.  Skin:    General: Skin is warm and dry.  Neurological:     Mental Status: She is alert. Mental status is at baseline.  Psychiatric:        Mood and Affect: Mood normal.       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   Time spent with patient: medications; activities; dietary    Ok Edwards NP [facility]   call  [phone]

## 2022-10-16 ENCOUNTER — Non-Acute Institutional Stay (SKILLED_NURSING_FACILITY): Payer: Medicare Other | Admitting: Adult Health

## 2022-10-16 ENCOUNTER — Encounter: Payer: Self-pay | Admitting: Adult Health

## 2022-10-16 DIAGNOSIS — E785 Hyperlipidemia, unspecified: Secondary | ICD-10-CM

## 2022-10-16 DIAGNOSIS — Z91199 Patient's noncompliance with other medical treatment and regimen due to unspecified reason: Secondary | ICD-10-CM | POA: Diagnosis not present

## 2022-10-16 DIAGNOSIS — K5909 Other constipation: Secondary | ICD-10-CM | POA: Diagnosis not present

## 2022-10-16 DIAGNOSIS — E1169 Type 2 diabetes mellitus with other specified complication: Secondary | ICD-10-CM

## 2022-10-16 NOTE — Progress Notes (Unsigned)
Location:  [facility] Room Number: [id number] Place of Service:  SNF (31)   CODE STATUS: DNR  Allergies  Allergen Reactions   Contrast Media [Iodinated Contrast Media] Anaphylaxis   Betamethasone Valerate Other (See Comments)    Chief Complaint  Patient presents with   Medical Management of Chronic Issues                Noncompliance with treatment:  Dyslipidemia associated with type 2 diabetes mellitus: Chronic constipation     HPI:  She is a 89 year old long term resident of this facility being seen for the management of her chronic illnesses: Noncompliance with treatment:  Dyslipidemia associated with type 2 diabetes mellitus: Chronic constipation. She does have chronic joint and back pain. There are no reports of constipation present. No reports of agitation; her anxiety is presently being managed.   Past Medical History:  Diagnosis Date   Arthritis    Coronary artery disease    angina   Diabetes mellitus with vascular complications    History of CVA (cerebrovascular accident)    Hypertension     Past Surgical History:  Procedure Laterality Date   APPENDECTOMY     CHOLECYSTECTOMY      Social History   Socioeconomic History   Marital status: Widowed    Spouse name: Not on file   Number of children: Not on file   Years of education: Not on file   Highest education level: Not on file  Occupational History   Not on file  Tobacco Use   Smoking status: Never   Smokeless tobacco: Never  Vaping Use   Vaping Use: Never used  Substance and Sexual Activity   Alcohol use: No   Drug use: No   Sexual activity: Not on file  Other Topics Concern   Not on file  Social History Narrative   Not on file   Social Determinants of Health   Financial Resource Strain: Not on file  Food Insecurity: Not on file  Transportation Needs: Not on file  Physical Activity: Not on file  Stress: Not on file  Social Connections: Not on file  Intimate Partner  Violence: Not on file   History reviewed. No pertinent family history.    VITAL SIGNS BP 131/64   Pulse 69   Temp 97.9 F (36.6 C)   Resp 18   Ht 5\' 4"  (1.626 m)   Wt 164 lb (74.4 kg)   SpO2 95%   BMI 28.15 kg/m   Outpatient Encounter Medications as of 10/16/2022  Medication Sig   acetaminophen (TYLENOL) 325 MG tablet Take 650 mg by mouth every 6 (six) hours as needed.    amLODipine (NORVASC) 10 MG tablet Take 1 tablet (10 mg total) by mouth daily.   Balsam [place]-Castor Oil (VENELEX) OINT Apply topically. Apply to sacrum and bilateral buttocks qshift for prevention.   busPIRone (BUSPAR) 7.5 MG tablet Take 7.5 mg by mouth 2 (two) times daily. For Anxiety   lisinopril (ZESTRIL) 10 MG tablet Take 10 mg by mouth daily.   LORazepam (ATIVAN) 0.5 MG tablet Take 0.5 tablets (0.25 mg total) by mouth at bedtime. Give at bedtime   melatonin 3 MG TABS tablet Take 3 mg by mouth at bedtime.   metFORMIN (GLUCOPHAGE) 500 MG tablet Take 1 tablet by mouth 2 (two) times daily.   methocarbamol (ROBAXIN) 500 MG tablet Take 500 mg by mouth every 6 (six) hours as needed.   miconazole (ZEASORB-AF) 2 %  powder small amt; topical,As Needed Special Instructions: prn as needed under abd folds/under arms or groin   NON FORMULARY Diet Change: Regular   No facility-administered encounter medications on file as of 10/16/2022.     SIGNIFICANT DIAGNOSTIC EXAMS   PREVIOUS   02-03-20: DEXA t score -1.009   NO NEW EXAMS.   LABS REVIEWED PREVIOUS   declining all lab work   02-02-21: d-dimer 1.80 04-11-21: wbc 8.7; hgb 14.2; hct 43.6; mcv 94.0 plt 217; glucose 143; bun 16; creat 0.60; k+ 4.0; na++ 135; ca 9.0 GFR>60; hgb a1c 6.3; chol 115; ldl 43; trig 134; hdl 47; tsh 1.386 vit B 12: 261 vit D 29.23  NO NEW LABS.   Review of Systems  Constitutional:  Negative for malaise/fatigue.  Respiratory:  Negative for cough and shortness of breath.   Cardiovascular:  Negative for chest pain, palpitations and leg  swelling.  Gastrointestinal:  Negative for abdominal pain, constipation and heartburn.  Musculoskeletal:  Positive for back pain, joint pain and myalgias.  Skin: Negative.   Neurological:  Negative for dizziness.  Psychiatric/Behavioral:  The patient is not nervous/anxious.    Physical Exam Constitutional:      General: She is not in acute distress.    Appearance: She is well-developed. She is not diaphoretic.  Neck:     Thyroid: No thyromegaly.  Cardiovascular:     Rate and Rhythm: Normal rate and regular rhythm.     Pulses: Normal pulses.     Heart sounds: Normal heart sounds.  Pulmonary:     Effort: Pulmonary effort is normal. No respiratory distress.     Breath sounds: Normal breath sounds.  Abdominal:     General: Bowel sounds are normal. There is no distension.     Palpations: Abdomen is soft.     Tenderness: There is no abdominal tenderness.  Musculoskeletal:     Cervical back: Neck supple.     Right lower leg: No edema.     Left lower leg: No edema.     Comments: Right hemiplegia with contractures.    Lymphadenopathy:     Cervical: No cervical adenopathy.  Skin:    General: Skin is warm and dry.  Neurological:     Mental Status: She is alert. Mental status is at baseline.  Psychiatric:        Mood and Affect: Mood normal.       ASSESSMENT/ PLAN:  TODAY  Noncompliance with treatment: she does not allow for weights; or blood draws.   2. Dyslipidemia associated with type 2 diabetes mellitus: off lipitor per her request  3. Chronic constipation: will continue senna s twice daily    PREVIOUS   4. Hypertension associated with type 2 diabetes mellitus: b/p 128/67 will continue lisinopril 10 mg daily and norvasc 10 mg daily   5. Type 2 diabetes mellitus with peripheral arterial artery disease: hgb A1c 6.3 will continue metformin 500 mg twice daily is on ace  6. Major depression with psychotic features: with continue ativan 0.25 mg nighty (has failed one wean);  buspar 7.5 mg twice daily is off zoloft per her request.   7. Hemiplegia right dominant side as late effect cerebral infarction, unspecified hemiplegia type: does have contractures present.   8. Vascular dementia with behavioral disturbance: weight is 164 pounds; declines all labs.   9. Dysphagia due to CVA: no signs of aspiration; is on thin liquids.    Ok Edwards NP [facility]  call [phone]

## 2022-10-17 DIAGNOSIS — E1169 Type 2 diabetes mellitus with other specified complication: Secondary | ICD-10-CM | POA: Insufficient documentation

## 2022-10-31 ENCOUNTER — Other Ambulatory Visit: Payer: Self-pay | Admitting: Adult Health

## 2022-10-31 MED ORDER — LORAZEPAM 0.5 MG PO TABS: 0.2500 mg | ORAL_TABLET | Freq: Every day | ORAL | 0 refills | Status: DC

## 2022-11-04 ENCOUNTER — Encounter: Payer: Self-pay | Admitting: Adult Health

## 2022-11-04 ENCOUNTER — Non-Acute Institutional Stay (INDEPENDENT_AMBULATORY_CARE_PROVIDER_SITE_OTHER): Payer: Medicare Other | Admitting: Adult Health

## 2022-11-04 DIAGNOSIS — Z0000 Encounter for general adult medical examination without abnormal findings: Secondary | ICD-10-CM | POA: Diagnosis not present

## 2022-11-04 NOTE — Progress Notes (Deleted)
Location:  [facility] Room Number: 123 Place of Service:  SNF (31)   CODE STATUS: DNR   Allergies  Allergen Reactions   Contrast Media [Iodinated Contrast Media] Anaphylaxis   Betamethasone Valerate Other (See Comments)    Chief Complaint  Patient presents with   Medicare Wellness    Annual Medicare Wellness    HPI:    Past Medical History:  Diagnosis Date   Arthritis    Coronary artery disease    angina   Diabetes mellitus with vascular complications    History of CVA (cerebrovascular accident)    Hypertension     Past Surgical History:  Procedure Laterality Date   APPENDECTOMY     CHOLECYSTECTOMY      Social History   Socioeconomic History   Marital status: Widowed    Spouse name: Not on file   Number of children: Not on file   Years of education: Not on file   Highest education level: Not on file  Occupational History   Not on file  Tobacco Use   Smoking status: Never   Smokeless tobacco: Never  Vaping Use   Vaping Use: Never used  Substance and Sexual Activity   Alcohol use: No   Drug use: No   Sexual activity: Not on file  Other Topics Concern   Not on file  Social History Narrative   Not on file   Social Determinants of Health   Financial Resource Strain: Not on file  Food Insecurity: Not on file  Transportation Needs: Not on file  Physical Activity: Not on file  Stress: Not on file  Social Connections: Not on file  Intimate Partner Violence: Not on file   History reviewed. No pertinent family history.    VITAL SIGNS BP 113/80   Pulse 64   Temp 98.1 F (36.7 C)   Resp 18   Ht 5\' 4"  (1.626 m)   Wt 164 lb (74.4 kg)   SpO2 97%   BMI 28.15 kg/m   Outpatient Encounter Medications as of 11/04/2022  Medication Sig   acetaminophen (TYLENOL) 325 MG tablet Take 650 mg by mouth every 6 (six) hours as needed.    amLODipine (NORVASC) 10 MG tablet Take 1 tablet (10 mg total) by mouth daily.   Balsam  [place]-Castor Oil (VENELEX) OINT Apply topically. Apply to sacrum and bilateral buttocks qshift for prevention.   busPIRone (BUSPAR) 5 MG tablet Take 5 mg by mouth 2 (two) times daily.   lisinopril (ZESTRIL) 10 MG tablet Take 10 mg by mouth daily.   LORazepam (ATIVAN) 0.5 MG tablet Take 0.5 tablets (0.25 mg total) by mouth at bedtime. Give at bedtime   melatonin 5 MG TABS Take 5 mg by mouth at bedtime.   metFORMIN (GLUCOPHAGE) 500 MG tablet Take 1 tablet by mouth 2 (two) times daily.   methocarbamol (ROBAXIN) 500 MG tablet Take 500 mg by mouth every 6 (six) hours as needed.   miconazole (ZEASORB-AF) 2 % powder small amt; topical,As Needed Special Instructions: prn as needed under abd folds/under arms or groin   NON FORMULARY Diet Change: Regular   [DISCONTINUED] busPIRone (BUSPAR) 7.5 MG tablet Take 7.5 mg by mouth 2 (two) times daily. For Anxiety (Patient not taking: Reported on 11/04/2022)   [DISCONTINUED] melatonin 3 MG TABS tablet Take 3 mg by mouth at bedtime. (Patient not taking: Reported on 11/04/2022)   No facility-administered encounter medications on file as of 11/04/2022.     SIGNIFICANT DIAGNOSTIC EXAMS  ASSESSMENT/ PLAN:     Ok Edwards NP [facility]  Contact [phone] Monday through Friday 8am- 5pm  After hours call [phone]

## 2022-11-04 NOTE — Progress Notes (Signed)
Subjective:   [name] [name] is a 89 y.o. female who presents for Medicare Annual (Subsequent) preventive examination.  Review of Systems    Review of Systems  Constitutional:  Negative for malaise/fatigue.  Respiratory:  Negative for cough and shortness of breath.   Cardiovascular:  Negative for chest pain, palpitations and leg swelling.  Gastrointestinal:  Negative for abdominal pain, constipation and heartburn.  Musculoskeletal:  Positive for back pain, joint pain and myalgias.  Skin: Negative.   Neurological:  Negative for dizziness.  Psychiatric/Behavioral:  The patient is not nervous/anxious.     Cardiac Risk Factors include: advanced age (>64men, >6 women);diabetes mellitus;sedentary lifestyle     Objective:    Today's Vitals   11/04/22 0906 11/04/22 1132  BP: 113/80   Pulse: 64   Resp: 18   Temp: 98.1 F (36.7 C)   SpO2: 97%   Weight: 164 lb (74.4 kg)   Height:  (1.626 m)   PainSc:  5    Body mass index is 28.15 kg/m.     11/04/2022    9:13 AM 10/16/2022    4:21 PM 08/14/2022    9:45 AM 07/16/2022   10:27 AM 07/05/2022    9:36 AM 02/07/2022    9:37 AM 01/11/2022    9:46 AM  Advanced Directives  Does Patient Have a Medical Advance Directive? Yes No Yes Yes Yes Yes Yes  Type of Advance Directive Out of facility DNR (pink MOST or yellow form)  Out of facility DNR (pink MOST or yellow form) Out of facility DNR (pink MOST or yellow form) Out of facility DNR (pink MOST or yellow form) Out of facility DNR (pink MOST or yellow form) Out of facility DNR (pink MOST or yellow form)   Does patient want to make changes to medical advance directive? No - Patient declined  No - Patient declined No - Patient declined No - Patient declined No - Patient declined No - Patient declined  Would patient like information on creating a medical advance directive? No - Patient declined No - Guardian declined          Significant value    Current Medications  (verified) Outpatient Encounter Medications as of 11/04/2022  Medication Sig   acetaminophen (TYLENOL) 325 MG tablet Take 650 mg by mouth every 6 (six) hours as needed.    amLODipine (NORVASC) 10 MG tablet Take 1 tablet (10 mg total) by mouth daily.   Balsam [place]-Castor Oil (VENELEX) OINT Apply topically. Apply to sacrum and bilateral buttocks qshift for prevention.   busPIRone (BUSPAR) 5 MG tablet Take 5 mg by mouth 2 (two) times daily.   lisinopril (ZESTRIL) 10 MG tablet Take 10 mg by mouth daily.   LORazepam (ATIVAN) 0.5 MG tablet Take 0.5 tablets (0.25 mg total) by mouth at bedtime. Give at bedtime   melatonin 5 MG TABS Take 5 mg by mouth at bedtime.   metFORMIN (GLUCOPHAGE) 500 MG tablet Take 1 tablet by mouth 2 (two) times daily.   methocarbamol (ROBAXIN) 500 MG tablet Take 500 mg by mouth every 6 (six) hours as needed.   miconazole (ZEASORB-AF) 2 % powder small amt; topical,As Needed Special Instructions: prn as needed under abd folds/under arms or groin   NON FORMULARY Diet Change: Regular   [DISCONTINUED] busPIRone (BUSPAR) 7.5 MG tablet Take 7.5 mg by mouth 2 (two) times daily. For Anxiety (Patient not taking: Reported on 11/04/2022)   [DISCONTINUED] melatonin 3 MG TABS tablet Take 3 mg by mouth  at bedtime. (Patient not taking: Reported on 11/04/2022)   No facility-administered encounter medications on file as of 11/04/2022.    Allergies (verified) Contrast media [iodinated contrast media] and Betamethasone valerate   History: Past Medical History:  Diagnosis Date   Arthritis    Coronary artery disease    angina   Diabetes mellitus with vascular complications    History of CVA (cerebrovascular accident)    Hypertension    Past Surgical History:  Procedure Laterality Date   APPENDECTOMY     CHOLECYSTECTOMY     History reviewed. No pertinent family history. Social History   Socioeconomic History   Marital status: Widowed    Spouse name: Not on file   Number of  children: Not on file   Years of education: Not on file   Highest education level: Not on file  Occupational History   Not on file  Tobacco Use   Smoking status: Never   Smokeless tobacco: Never  Vaping Use   Vaping Use: Never used  Substance and Sexual Activity   Alcohol use: No   Drug use: No   Sexual activity: Not on file  Other Topics Concern   Not on file  Social History Narrative   Not on file   Social Determinants of Health   Financial Resource Strain: Not on file  Food Insecurity: Not on file  Transportation Needs: Not on file  Physical Activity: Not on file  Stress: Not on file  Social Connections: Not on file    Tobacco Counseling Counseling given: Not Answered   Clinical Intake:  Pre-visit preparation completed: Yes  Pain : 0-10 Pain Score: 5  Pain Type: Chronic pain Pain Location: Other (Comment) Pain Orientation: Distal Pain Radiating Towards: has back arm leg pain Pain Descriptors / Indicators: Aching, Burning, Constant Pain Onset: Other (comment) Pain Frequency: Constant Pain Relieving Factors: none Effect of Pain on Daily Activities: spends all of her time in bed; will not take medications; or participate in therapy  Pain Relieving Factors: none  BMI - recorded: 28.15 Nutritional Status: BMI 25 -29 Overweight Nutritional Risks: Unintentional weight loss Diabetes: Yes CBG done?: No Did pt. bring in CBG monitor from home?: No  How often do you need to have someone help you when you read instructions, pamphlets, or other written materials from your doctor or pharmacy?: 5 - Always  Diabetic?yes  Interpreter Needed?: No      Activities of Daily Living    11/04/2022   11:37 AM  In your present state of health, do you have any difficulty performing the following activities:  Hearing? 0  Vision? 0  Difficulty concentrating or making decisions? 1  Walking or climbing stairs? 1  Dressing or bathing? 1  Doing errands, shopping? 1   Preparing Food and eating ? Y  Using the Toilet? Y  In the past six months, have you accidently leaked urine? Y  Do you have problems with loss of bowel control? Y  Managing your Medications? Y  Managing your Finances? Y    Patient Care Team: Sharee Holster, NP as PCP - [facility]  Indicate any recent Medical Services you may have received from other than [facility] providers in the past year (date may be approximate).     Assessment:   This is a routine wellness examination for [place].  Hearing/Vision screen No results found.  Dietary issues and exercise activities discussed: Current Exercise Habits: The patient does not participate  in regular exercise at present, Exercise limited by: None identified   Goals Addressed             This Visit's Progress    Absence of Fall and Fall-Related Injury   On track    Evidence-based guidance:  Assess fall risk using a validated tool when available. Consider balance and gait impairment, muscle weakness, diminished vision or hearing, environmental hazards, presence of urinary or bowel urgency and/or incontinence.  Communicate fall injury risk to interprofessional healthcare team.  Develop a fall prevention plan with the patient and family.  Promote use of personal vision and auditory aids.  Promote reorientation, appropriate sensory stimulation, and routines to decrease risk of fall when changes in mental status are present.  Assess assistance level required for safe and effective self-care; consider referral for home care.  Encourage physical activity, such as performance of self-care at highest level of ability, strength and balance exercise program, and provision of appropriate assistive devices; refer to rehabilitation therapy.  Refer to community-based fall prevention program where available.  If fall occurs, determine the cause and revise fall injury prevention plan.   Regularly review medication contribution to fall risk; consider risk related to polypharmacy and age.  Refer to pharmacist for consultation when concerns about medications are revealed.  Balance adequate pain management with potential for oversedation.  Provide guidance related to environmental modifications.  Consider supplementation with Vitamin D.   Notes:      DIET - INCREASE WATER INTAKE   On track    Follow up with Primary Care Provider   On track    General - Client will not be readmitted within 30 days ([facility])   On track      Depression Screen    11/04/2022   11:36 AM 10/16/2022    4:19 PM 07/23/2022   12:40 PM 10/24/2021    2:39 PM 09/27/2021   10:07 AM 09/04/2021   11:05 AM 07/20/2021    2:54 PM  PHQ 2/9 Scores  PHQ - 2 Score 0 0 0   0 2  PHQ- 9 Score   0   0 4  Exception Documentation    Patient refusal Patient refusal      Fall Risk    11/04/2022   11:35 AM 10/16/2022    4:20 PM 07/23/2022   12:40 PM 07/05/2022    9:36 AM 10/24/2021    2:39 PM  Fall Risk   Falls in the past year? 0 0 0 0 0  Number falls in past yr: 0 0 0 0 0  Injury with Fall? 0 0 0 0 0  Risk for fall due to : Impaired balance/gait;Impaired mobility No Fall Risks Impaired balance/gait;Impaired mobility History of fall(s);Impaired balance/gait;Impaired mobility History of fall(s);Impaired balance/gait;Impaired mobility  Follow up Falls evaluation completed Falls evaluation completed  Falls evaluation completed     FALL RISK PREVENTION PERTAINING TO THE HOME:  Any stairs in or around the home? Yes  If so, are there any without handrails? No  Home free of loose throw rugs in walkways, pet beds, electrical cords, etc? Yes  Adequate lighting in your home to reduce risk of falls? Yes   ASSISTIVE DEVICES UTILIZED TO PREVENT FALLS:  Life alert? No  Use of a cane, walker or w/c? No  Grab bars in the bathroom? Yes  Shower chair or bench in shower? Yes  Elevated toilet seat or a handicapped toilet? Yes    TIMED UP AND GO:  Was the test  performed? No .  Is bed bound   Cognitive Function:    11/04/2022   11:36 AM 10/24/2021    2:40 PM 10/23/2020    2:36 PM  MMSE - Mini Mental State Exam  Not completed: Refused Refused Refused        11/04/2022   11:36 AM 10/23/2020    2:36 PM  6CIT Screen  What Year? 0 points 0 points  What month? 0 points 0 points  What time? 0 points 0 points  Count back from 20 4 points 2 points  Months in reverse 4 points 2 points  Repeat phrase 4 points 4 points  Total Score 12 points 8 points    Immunizations  There is no immunization history on file for this patient.  TDAP status: Due, Education has been provided regarding the importance of this vaccine. Advised may receive this vaccine at local pharmacy or Health Dept. Aware to provide a copy of the vaccination record if obtained from local pharmacy or Health Dept. Verbalized acceptance and understanding. Declined   Flu Vaccine status: Declined, Education has been provided regarding the importance of this vaccine but patient still declined. Advised may receive this vaccine at local pharmacy or Health Dept. Aware to provide a copy of the vaccination record if obtained from local pharmacy or Health Dept. Verbalized acceptance and understanding.  Pneumococcal vaccine status: Declined,  Education has been provided regarding the importance of this vaccine but patient still declined. Advised may receive this vaccine at local pharmacy or Health Dept. Aware to provide a copy of the vaccination record if obtained from local pharmacy or Health Dept. Verbalized acceptance and understanding.   Covid-19 vaccine status: Declined, Education has been provided regarding the importance of this vaccine but patient still declined. Advised may receive this vaccine at local pharmacy or Health Dept.or vaccine clinic. Aware to provide a copy of the vaccination record if obtained from local pharmacy or Health Dept. Verbalized  acceptance and understanding.  Qualifies for Shingles Vaccine? Yes   Zostavax completed No   Shingrix Completed?: No.    Education has been provided regarding the importance of this vaccine. Patient has been advised to call insurance company to determine out of pocket expense if they have not yet received this vaccine. Advised may also receive vaccine at local pharmacy or Health Dept. Verbalized acceptance and understanding.  Screening Tests Health Maintenance  Topic Date Due   Medicare Annual Wellness (AWV)  10/25/2022   COVID-19 Vaccine (1) 07/23/2023 (Originally 10/26/1933)   Pneumonia Vaccine 8+ Years old (1 of 1 - PCV) 08/15/2023 (Originally 04/26/2000)   INFLUENZA VACCINE  02/20/2023   FOOT EXAM  07/17/2023   DEXA SCAN  Completed   HPV VACCINES  Aged Out   DTaP/Tdap/Td  Discontinued   HEMOGLOBIN A1C  Discontinued   OPHTHALMOLOGY EXAM  Discontinued   Zoster Vaccines- Shingrix  Discontinued    Health Maintenance  Health Maintenance Due  Topic Date Due   Medicare Annual Wellness (AWV)  10/25/2022    Colorectal cancer screening: No longer required.   Mammogram status: No longer required due to age.  Bone density: has declined   Lung Cancer Screening: (Low Dose CT Chest recommended if Age 12-80 years, 30 pack-year currently smoking OR have quit w/in 15years.) does not qualify.   Lung Cancer Screening Referral: n/a   Additional Screening:  Hepatitis C Screening: does qualify; Completed declines   Vision Screening: Recommended annual ophthalmology exams for early detection of glaucoma and other disorders of  the eye. Is the patient up to date with their annual eye exam?  No  Who is the provider or what is the name of the office in which the patient attends annual eye exams?  If pt is not established with a provider, would they like to be referred to a provider to establish care? No .   Dental Screening: Recommended annual dental exams for proper oral hygiene  Community  Resource Referral / Chronic Care Management: CRR required this visit?  No   CCM required this visit?  No      Plan:     I have personally reviewed and noted the following in the patient's chart:   Medical and social history Use of alcohol, tobacco or illicit drugs  Current medications and supplements including opioid prescriptions. Patient is currently taking opioid prescriptions. Information provided to patient regarding non-opioid alternatives. Patient advised to discuss non-opioid treatment plan with their provider. Functional ability and status Nutritional status Physical activity Advanced directives List of other physicians Hospitalizations, surgeries, and ER visits in previous 12 months Vitals Screenings to include cognitive, depression, and falls Referrals and appointments  In addition, I have reviewed and discussed with patient certain preventive protocols, quality metrics, and best practice recommendations. A written personalized care plan for preventive services as well as general preventive health recommendations were provided to patient.     Sharee Holster, NP   11/04/2022   Nurse Notes: this exam was performed by myself at this facility

## 2022-11-04 NOTE — Patient Instructions (Signed)
   [name] [name] , Thank you for taking time to come for your Medicare Wellness Visit. I appreciate your ongoing commitment to your health goals. Please review the following plan we discussed and let me know if I can assist you in the future.   These are the goals we discussed:  Goals      Absence of Fall and Fall-Related Injury     Evidence-based guidance:  Assess fall risk using a validated tool when available. Consider balance and gait impairment, muscle weakness, diminished vision or hearing, environmental hazards, presence of urinary or bowel urgency and/or incontinence.  Communicate fall injury risk to interprofessional healthcare team.  Develop a fall prevention plan with the patient and family.  Promote use of personal vision and auditory aids.  Promote reorientation, appropriate sensory stimulation, and routines to decrease risk of fall when changes in mental status are present.  Assess assistance level required for safe and effective self-care; consider referral for home care.  Encourage physical activity, such as performance of self-care at highest level of ability, strength and balance exercise program, and provision of appropriate assistive devices; refer to rehabilitation therapy.  Refer to community-based fall prevention program where available.  If fall occurs, determine the cause and revise fall injury prevention plan.  Regularly review medication contribution to fall risk; consider risk related to polypharmacy and age.  Refer to pharmacist for consultation when concerns about medications are revealed.  Balance adequate pain management with potential for oversedation.  Provide guidance related to environmental modifications.  Consider supplementation with Vitamin D.   Notes:      DIET - INCREASE WATER INTAKE     Follow up with Primary Care Provider     General - Client will not be readmitted within 30 days ([facility])        This is a list of the screening recommended for  you and due dates:  Health Maintenance  Topic Date Due   COVID-19 Vaccine (1) 07/23/2023*   Pneumonia Vaccine (1 of 1 - PCV) 08/15/2023*   Flu Shot  02/20/2023   Complete foot exam   07/17/2023   Medicare Annual Wellness Visit  11/04/2023   DEXA scan (bone density measurement)  Completed   HPV Vaccine  Aged Out   DTaP/Tdap/Td vaccine  Discontinued   Hemoglobin A1C  Discontinued   Eye exam for diabetics  Discontinued   Zoster (Shingles) Vaccine  Discontinued  *Topic was postponed. The date shown is not the original due date.

## 2022-11-13 ENCOUNTER — Encounter: Payer: Self-pay | Admitting: Adult Health

## 2022-11-13 ENCOUNTER — Non-Acute Institutional Stay (SKILLED_NURSING_FACILITY): Payer: Medicare Other | Admitting: Adult Health

## 2022-11-13 DIAGNOSIS — E1151 Type 2 diabetes mellitus with diabetic peripheral angiopathy without gangrene: Secondary | ICD-10-CM

## 2022-11-13 DIAGNOSIS — E1159 Type 2 diabetes mellitus with other circulatory complications: Secondary | ICD-10-CM | POA: Diagnosis not present

## 2022-11-13 DIAGNOSIS — F323 Major depressive disorder, single episode, severe with psychotic features: Secondary | ICD-10-CM | POA: Diagnosis not present

## 2022-11-13 DIAGNOSIS — I152 Hypertension secondary to endocrine disorders: Secondary | ICD-10-CM

## 2022-11-13 NOTE — Progress Notes (Signed)
Location:  [facility] Room Number: 123 Place of Service:  SNF (31)   CODE STATUS: dnr   Allergies  Allergen Reactions   Contrast Media [Iodinated Contrast Media] Anaphylaxis   Betamethasone Valerate Other (See Comments)    Chief Complaint  Patient presents with   Medical Management of Chronic Issues             Hypertension associated with type 2 diabetes mellitus  Type 2 diabetes mellitus with peripheral arterial artery disease:  Major depression with psychotic features     HPI:  She is a 89 year old long term resident of this facility being seen for the management of her chronic illnesses:  Hypertension associated with type 2 diabetes mellitus  Type 2 diabetes mellitus with peripheral arterial artery disease:  Major depression with psychotic features. She does have chronic pain; for which she will not take pain medications. She will not allow for lab draws and will decline her medications. She is presently being weaned from her buspar.   Past Medical History:  Diagnosis Date   Arthritis    Coronary artery disease    angina   Diabetes mellitus with vascular complications    History of CVA (cerebrovascular accident)    Hypertension     Past Surgical History:  Procedure Laterality Date   APPENDECTOMY     CHOLECYSTECTOMY      Social History   Socioeconomic History   Marital status: Widowed    Spouse name: Not on file   Number of children: Not on file   Years of education: Not on file   Highest education level: Not on file  Occupational History   Not on file  Tobacco Use   Smoking status: Never   Smokeless tobacco: Never  Vaping Use   Vaping Use: Never used  Substance and Sexual Activity   Alcohol use: No   Drug use: No   Sexual activity: Not on file  Other Topics Concern   Not on file  Social History Narrative   Not on file   Social Determinants of Health   Financial Resource Strain: Not on file  Food Insecurity: Not on file   Transportation Needs: Not on file  Physical Activity: Not on file  Stress: Not on file  Social Connections: Not on file  Intimate Partner Violence: Not on file   No family history on file.    VITAL SIGNS BP 126/65   Pulse 68   Temp (!) 97.2 F (36.2 C)   Resp 18   Ht 5\' 4"  (1.626 m)   Wt 164 lb (74.4 kg)   SpO2 97%   BMI 28.15 kg/m   Outpatient Encounter Medications as of 11/13/2022  Medication Sig   acetaminophen (TYLENOL) 325 MG tablet Take 650 mg by mouth every 6 (six) hours as needed.    amLODipine (NORVASC) 10 MG tablet Take 1 tablet (10 mg total) by mouth daily.   Balsam [place]-Castor Oil (VENELEX) OINT Apply topically. Apply to sacrum and bilateral buttocks qshift for prevention.   busPIRone (BUSPAR) 5 MG tablet Take 5 mg by mouth 2 (two) times daily.   lisinopril (ZESTRIL) 10 MG tablet Take 10 mg by mouth daily.   LORazepam (ATIVAN) 0.5 MG tablet Take 0.5 tablets (0.25 mg total) by mouth at bedtime. Give at bedtime   melatonin 5 MG TABS Take 5 mg by mouth at bedtime.   metFORMIN (GLUCOPHAGE) 500 MG tablet Take 1 tablet by mouth 2 (two) times  daily.   methocarbamol (ROBAXIN) 500 MG tablet Take 500 mg by mouth every 6 (six) hours as needed.   miconazole (ZEASORB-AF) 2 % powder small amt; topical,As Needed Special Instructions: prn as needed under abd folds/under arms or groin   NON FORMULARY Diet Change: Regular   No facility-administered encounter medications on file as of 11/13/2022.     SIGNIFICANT DIAGNOSTIC EXAMS   PREVIOUS   02-03-20: DEXA t score -1.009   NO NEW EXAMS.   LABS REVIEWED PREVIOUS   declining all lab work   02-02-21: d-dimer 1.80 04-11-21: wbc 8.7; hgb 14.2; hct 43.6; mcv 94.0 plt 217; glucose 143; bun 16; creat 0.60; k+ 4.0; na++ 135; ca 9.0 GFR>60; hgb a1c 6.3; chol 115; ldl 43; trig 134; hdl 47; tsh 1.386 vit B 12: 261 vit D 29.23  NO NEW LABS.   Review of Systems  Constitutional:  Negative for malaise/fatigue.  Respiratory:   Negative for cough and shortness of breath.   Cardiovascular:  Negative for chest pain, palpitations and leg swelling.  Gastrointestinal:  Negative for abdominal pain, constipation and heartburn.  Musculoskeletal:  Positive for joint pain and myalgias. Negative for back pain.  Skin: Negative.   Neurological:  Negative for dizziness.  Psychiatric/Behavioral:  The patient is not nervous/anxious.    Physical Exam Constitutional:      General: She is not in acute distress.    Appearance: She is not diaphoretic.  Eyes:     Conjunctiva/sclera: Conjunctivae normal.  Neck:     Thyroid: No thyromegaly.     Vascular: No JVD.  Cardiovascular:     Rate and Rhythm: Normal rate and regular rhythm.     Pulses: Normal pulses.  Pulmonary:     Effort: Pulmonary effort is normal. No respiratory distress.     Breath sounds: Normal breath sounds. No wheezing.  Abdominal:     General: Bowel sounds are normal. There is no distension.     Palpations: Abdomen is soft.     Tenderness: There is no abdominal tenderness.  Musculoskeletal:     Cervical back: Neck supple.     Right lower leg: No edema.     Left lower leg: No edema.     Comments: Right hemiplegia with contractures   Lymphadenopathy:     Cervical: No cervical adenopathy.  Skin:    General: Skin is warm and dry.  Neurological:     Mental Status: She is alert. Mental status is at baseline.  Psychiatric:        Mood and Affect: Mood normal.       ASSESSMENT/ PLAN:  TODAY  Hypertension associated with type 2 diabetes mellitus b/p 126/65 will continue lisinopril 10 mg daily and norvasc 10 mg daily   2. Type 2 diabetes mellitus with peripheral arterial artery disease: hgb A1c 6.3 will continue metformin 500 mg twice daily is on ace  3. Major depression with psychotic features: is on ativan 0.25 mg nightly (has failed one wean) is on buspar 5 mg twice daily is off zoloft per her choice   PREVIOUS   4. Hemiplegia right dominant side as  late effect cerebral infarction, unspecified hemiplegia type: does have contractures present.   5. Vascular dementia with behavioral disturbance: weight is 164 pounds; declines all labs.   6. Dysphagia due to CVA: no signs of aspiration; is on thin liquids.  7. Noncompliance with treatment: she does not allow for weights; or blood draws; will declin medications at times as well.  8. Dyslipidemia associated with type 2 diabetes mellitus: off lipitor per her request  9. Chronic constipation: will continue senna s twice daily        Synthia Innocent NP [facility]  call [phone]

## 2022-11-27 ENCOUNTER — Other Ambulatory Visit: Payer: Self-pay | Admitting: Adult Health

## 2022-11-27 MED ORDER — LORAZEPAM 0.5 MG PO TABS: 0.2500 mg | ORAL_TABLET | Freq: Every day | ORAL | 0 refills | Status: DC

## 2022-12-05 ENCOUNTER — Encounter: Payer: Self-pay | Admitting: Internal Medicine

## 2022-12-05 ENCOUNTER — Non-Acute Institutional Stay (SKILLED_NURSING_FACILITY): Payer: Medicare Other | Admitting: Internal Medicine

## 2022-12-05 DIAGNOSIS — I69391 Dysphagia following cerebral infarction: Secondary | ICD-10-CM | POA: Diagnosis not present

## 2022-12-05 DIAGNOSIS — E1169 Type 2 diabetes mellitus with other specified complication: Secondary | ICD-10-CM | POA: Diagnosis not present

## 2022-12-05 DIAGNOSIS — E785 Hyperlipidemia, unspecified: Secondary | ICD-10-CM

## 2022-12-05 DIAGNOSIS — Z91199 Patient's noncompliance with other medical treatment and regimen due to unspecified reason: Secondary | ICD-10-CM | POA: Diagnosis not present

## 2022-12-05 DIAGNOSIS — I69351 Hemiplegia and hemiparesis following cerebral infarction affecting right dominant side: Secondary | ICD-10-CM

## 2022-12-05 DIAGNOSIS — F0630 Mood disorder due to known physiological condition, unspecified: Secondary | ICD-10-CM

## 2022-12-05 DIAGNOSIS — Z8673 Personal history of transient ischemic attack (TIA), and cerebral infarction without residual deficits: Secondary | ICD-10-CM

## 2022-12-05 DIAGNOSIS — I69398 Other sequelae of cerebral infarction: Secondary | ICD-10-CM

## 2022-12-05 NOTE — Assessment & Plan Note (Signed)
She continues to decline labs to guide secondary prevention therapy in the context of respecters of hypertension, dyslipidemia, and diabetes.
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
## 2022-12-05 NOTE — Assessment & Plan Note (Addendum)
Unfortunately she is refusing labs which would guide secondary prevention therapies in the context of hypertension, diabetes, dyslipidemia.

## 2022-12-05 NOTE — Assessment & Plan Note (Signed)
Copies of CT report given to the patient and findings explained.

## 2022-12-05 NOTE — Assessment & Plan Note (Signed)
She denies significant dysphagia at this time.

## 2022-12-05 NOTE — Assessment & Plan Note (Signed)
She states she is having some increased range of motion of the right foot although this is not clinically significant.
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
## 2022-12-06 NOTE — Patient Instructions (Signed)
See assessment and plan under each diagnosis in the problem list and acutely for this visit 

## 2022-12-23 ENCOUNTER — Other Ambulatory Visit: Payer: Self-pay | Admitting: Adult Health

## 2022-12-23 MED ORDER — LORAZEPAM 0.5 MG PO TABS: 0.2500 mg | ORAL_TABLET | Freq: Every day | ORAL | 0 refills | Status: DC

## 2023-01-03 ENCOUNTER — Non-Acute Institutional Stay (SKILLED_NURSING_FACILITY): Payer: Medicare Other | Admitting: Adult Health

## 2023-01-03 ENCOUNTER — Encounter: Payer: Self-pay | Admitting: Adult Health

## 2023-01-03 DIAGNOSIS — E1159 Type 2 diabetes mellitus with other circulatory complications: Secondary | ICD-10-CM | POA: Diagnosis not present

## 2023-01-03 DIAGNOSIS — I152 Hypertension secondary to endocrine disorders: Secondary | ICD-10-CM

## 2023-01-03 DIAGNOSIS — E1151 Type 2 diabetes mellitus with diabetic peripheral angiopathy without gangrene: Secondary | ICD-10-CM | POA: Diagnosis not present

## 2023-01-03 DIAGNOSIS — I69351 Hemiplegia and hemiparesis following cerebral infarction affecting right dominant side: Secondary | ICD-10-CM | POA: Diagnosis not present

## 2023-01-03 NOTE — Progress Notes (Signed)
Location:  [facility] Room Number: 123 Place of Service:  SNF (31)   CODE STATUS: dnr  Allergies  Allergen Reactions   Contrast Media [Iodinated Contrast Media] Anaphylaxis   Betamethasone Valerate Other (See Comments)    Chief Complaint  Patient presents with   Acute Visit    Care plan meeting     HPI:  We have come together for her care plan meeting.  BIMS/MOOD: declined to answer questions. Spends all of her time in bed; no falls.She is dependent with her adl care. She is incontinent of bladder and frequently incontinent of bowel. Dietary: setup with meals weight is 164 pounds. Requires setup for meals.  Therapy: none at this time. Activities: tv, reading. She will decline medications on a regular basis and declines lab draws as well. She will continue to be followed for her chronic illnesses including  Diabetes mellitus type 2 with peripheral artery disease Hypertension associated with type 2 diabetes mellitus  Spastic hemiplegia of right dominant side as late effect of cerebral infarction   Past Medical History:  Diagnosis Date   Arthritis    Coronary artery disease    angina   Diabetes mellitus with vascular complications    History of CVA (cerebrovascular accident)    Hypertension     Past Surgical History:  Procedure Laterality Date   APPENDECTOMY     CHOLECYSTECTOMY      Social History   Socioeconomic History   Marital status: Widowed    Spouse name: Not on file   Number of children: Not on file   Years of education: Not on file   Highest education level: Not on file  Occupational History   Not on file  Tobacco Use   Smoking status: Never   Smokeless tobacco: Never  Vaping Use   Vaping Use: Never used  Substance and Sexual Activity   Alcohol use: No   Drug use: No   Sexual activity: Not on file  Other Topics Concern   Not on file  Social History Narrative   Not on file   Social Determinants of Health   Financial  Resource Strain: Not on file  Food Insecurity: Not on file  Transportation Needs: Not on file  Physical Activity: Not on file  Stress: Not on file  Social Connections: Not on file  Intimate Partner Violence: Not on file   No family history on file.    VITAL SIGNS BP (!) 129/56   Pulse 72   Temp 98.1 F (36.7 C)   Resp 14   Ht 5\' 4"  (1.626 m)   Wt 164 lb (74.4 kg)   SpO2 99%   BMI 28.15 kg/m   Outpatient Encounter Medications as of 01/03/2023  Medication Sig   acetaminophen (TYLENOL) 325 MG tablet Take 650 mg by mouth every 6 (six) hours as needed.    amLODipine (NORVASC) 10 MG tablet Take 1 tablet (10 mg total) by mouth daily.   Balsam [place]-Castor Oil (VENELEX) OINT Apply topically. Apply to sacrum and bilateral buttocks qshift for prevention.   busPIRone (BUSPAR) 5 MG tablet Take 5 mg by mouth 2 (two) times daily.   lisinopril (ZESTRIL) 10 MG tablet Take 10 mg by mouth daily.   LORazepam (ATIVAN) 0.5 MG tablet Take 0.5 tablets (0.25 mg total) by mouth at bedtime. Give at bedtime   melatonin 5 MG TABS Take 5 mg by mouth at bedtime.   metFORMIN (GLUCOPHAGE) 500 MG tablet Take 1 tablet by  mouth 2 (two) times daily.   methocarbamol (ROBAXIN) 500 MG tablet Take 500 mg by mouth every 6 (six) hours as needed.   miconazole (ZEASORB-AF) 2 % powder small amt; topical,As Needed Special Instructions: prn as needed under abd folds/under arms or groin   NON FORMULARY Diet Change: Regular   No facility-administered encounter medications on file as of 01/03/2023.     SIGNIFICANT DIAGNOSTIC EXAMS  PREVIOUS   02-03-20: DEXA t score -1.009   NO NEW EXAMS.   LABS REVIEWED PREVIOUS   declining all lab work   02-02-21: d-dimer 1.80 04-11-21: wbc 8.7; hgb 14.2; hct 43.6; mcv 94.0 plt 217; glucose 143; bun 16; creat 0.60; k+ 4.0; na++ 135; ca 9.0 GFR>60; hgb a1c 6.3; chol 115; ldl 43; trig 134; hdl 47; tsh 1.386 vit B 12: 261 vit D 29.23  NO NEW LABS.    Review of Systems   Constitutional:  Negative for malaise/fatigue.  Respiratory:  Negative for cough and shortness of breath.   Cardiovascular:  Negative for chest pain, palpitations and leg swelling.  Gastrointestinal:  Negative for abdominal pain, constipation and heartburn.  Musculoskeletal:  Positive for back pain, joint pain and myalgias.  Skin: Negative.   Neurological:  Negative for dizziness.  Psychiatric/Behavioral:  The patient is not nervous/anxious.    Physical Exam Constitutional:      General: She is not in acute distress.    Appearance: She is well-developed. She is not diaphoretic.  Neck:     Thyroid: No thyromegaly.  Cardiovascular:     Rate and Rhythm: Normal rate and regular rhythm.     Pulses: Normal pulses.     Heart sounds: Normal heart sounds.  Pulmonary:     Effort: Pulmonary effort is normal. No respiratory distress.     Breath sounds: Normal breath sounds.  Abdominal:     General: Bowel sounds are normal. There is no distension.     Palpations: Abdomen is soft.     Tenderness: There is no abdominal tenderness.  Musculoskeletal:     Cervical back: Neck supple.     Right lower leg: No edema.     Left lower leg: No edema.     Comments:  Right hemiplegia with contractures    Lymphadenopathy:     Cervical: No cervical adenopathy.  Skin:    General: Skin is warm and dry.  Neurological:     Mental Status: She is alert. Mental status is at baseline.  Psychiatric:        Mood and Affect: Mood normal.      ASSESSMENT/ PLAN:  TODAY  Diabetes mellitus type 2 with peripheral artery disease Hypertension associated with type 2 diabetes mellitus Spastic hemiplegia of right dominant side as late effect of cerebral infarction   Will continue current medications Will continue current plan of care She will decline medications; lab work She continues to confabulate about staff members; family brings in OTC products.   Time spent with patient: 40 minutes: medications; plan  of care dietary.     Synthia Innocent NP [facility]  call [phone]

## 2023-01-20 ENCOUNTER — Non-Acute Institutional Stay (SKILLED_NURSING_FACILITY): Payer: Medicare Other | Admitting: Adult Health

## 2023-01-20 ENCOUNTER — Encounter: Payer: Self-pay | Admitting: Adult Health

## 2023-01-20 DIAGNOSIS — I69351 Hemiplegia and hemiparesis following cerebral infarction affecting right dominant side: Secondary | ICD-10-CM | POA: Diagnosis not present

## 2023-01-20 DIAGNOSIS — I69391 Dysphagia following cerebral infarction: Secondary | ICD-10-CM

## 2023-01-20 DIAGNOSIS — F01518 Vascular dementia, unspecified severity, with other behavioral disturbance: Secondary | ICD-10-CM

## 2023-01-20 NOTE — Progress Notes (Unsigned)
Location:  [facility] Room Number: 123 Place of Service:  SNF (31)   CODE STATUS: dnr   Allergies  Allergen Reactions   Contrast Media [Iodinated Contrast Media] Anaphylaxis   Betamethasone Valerate Other (See Comments)    Chief Complaint  Patient presents with   Medical Management of Chronic Issues            Hemiplegia right dominant side as late effect cerebral infarction, unspecified hemiplegia type:  Vascular dementia with behavioral disturbance: Dysphagia due to CVA:     HPI:  She is a 89 year old long term resident of this facility being seen for the management of her chronic illnesses: Hemiplegia right dominant side as late effect cerebral infarction, unspecified hemiplegia type:  Vascular dementia with behavioral disturbance: Dysphagia due to CVA. Her pain is managed. She does use hand warmers at times; that her family brings in. Her weight appears to be stable.   Past Medical History:  Diagnosis Date   Arthritis    Coronary artery disease    angina   Diabetes mellitus with vascular complications    History of CVA (cerebrovascular accident)    Hypertension     Past Surgical History:  Procedure Laterality Date   APPENDECTOMY     CHOLECYSTECTOMY      Social History   Socioeconomic History   Marital status: Widowed    Spouse name: Not on file   Number of children: Not on file   Years of education: Not on file   Highest education level: Not on file  Occupational History   Not on file  Tobacco Use   Smoking status: Never   Smokeless tobacco: Never  Vaping Use   Vaping Use: Never used  Substance and Sexual Activity   Alcohol use: No   Drug use: No   Sexual activity: Not on file  Other Topics Concern   Not on file  Social History Narrative   Not on file   Social Determinants of Health   Financial Resource Strain: Not on file  Food Insecurity: Not on file  Transportation Needs: Not on file  Physical Activity: Not on file   Stress: Not on file  Social Connections: Not on file  Intimate Partner Violence: Not on file   No family history on file.    VITAL SIGNS BP 138/78   Pulse 71   Temp (!) 97.2 F (36.2 C)   Resp 20   Ht 5\' 4"  (1.626 m)   Wt 164 lb (74.4 kg)   SpO2 96%   BMI 28.15 kg/m   Outpatient Encounter Medications as of 01/20/2023  Medication Sig   acetaminophen (TYLENOL) 325 MG tablet Take 650 mg by mouth every 6 (six) hours as needed.    amLODipine (NORVASC) 10 MG tablet Take 1 tablet (10 mg total) by mouth daily.   Balsam [place]-Castor Oil (VENELEX) OINT Apply topically. Apply to sacrum and bilateral buttocks qshift for prevention.   busPIRone (BUSPAR) 5 MG tablet Take 5 mg by mouth 2 (two) times daily.   lisinopril (ZESTRIL) 10 MG tablet Take 10 mg by mouth daily.   melatonin 5 MG TABS Take 5 mg by mouth at bedtime.   metFORMIN (GLUCOPHAGE) 500 MG tablet Take 1 tablet by mouth 2 (two) times daily.   methocarbamol (ROBAXIN) 500 MG tablet Take 500 mg by mouth every 6 (six) hours as needed.   miconazole (ZEASORB-AF) 2 % powder small amt; topical,As Needed Special Instructions: prn as needed  under abd folds/under arms or groin   NON FORMULARY Diet Change: Regular   [DISCONTINUED] LORazepam (ATIVAN) 0.5 MG tablet Take 0.5 tablets (0.25 mg total) by mouth at bedtime. Give at bedtime   No facility-administered encounter medications on file as of 01/20/2023.     SIGNIFICANT DIAGNOSTIC EXAMS  PREVIOUS   02-03-20: DEXA t score -1.009   NO NEW EXAMS.   LABS REVIEWED PREVIOUS   declining all lab work   02-02-21: d-dimer 1.80 04-11-21: wbc 8.7; hgb 14.2; hct 43.6; mcv 94.0 plt 217; glucose 143; bun 16; creat 0.60; k+ 4.0; na++ 135; ca 9.0 GFR>60; hgb a1c 6.3; chol 115; ldl 43; trig 134; hdl 47; tsh 1.386 vit B 12: 261 vit D 29.23  NO NEW LABS.   Review of Systems  Constitutional:  Negative for malaise/fatigue.  Respiratory:  Negative for cough and shortness of breath.   Cardiovascular:   Negative for chest pain, palpitations and leg swelling.  Gastrointestinal:  Negative for abdominal pain, constipation and heartburn.  Musculoskeletal:  Negative for back pain, joint pain and myalgias.  Skin: Negative.   Neurological:  Negative for dizziness.  Psychiatric/Behavioral:  The patient is not nervous/anxious.    Physical Exam Constitutional:      General: She is not in acute distress.    Appearance: She is well-developed. She is not diaphoretic.  Neck:     Thyroid: No thyromegaly.  Cardiovascular:     Rate and Rhythm: Normal rate and regular rhythm.     Pulses: Normal pulses.     Heart sounds: Normal heart sounds.  Pulmonary:     Effort: Pulmonary effort is normal. No respiratory distress.     Breath sounds: Normal breath sounds.  Abdominal:     General: Bowel sounds are normal. There is no distension.     Palpations: Abdomen is soft.     Tenderness: There is no abdominal tenderness.  Musculoskeletal:     Cervical back: Neck supple.     Right lower leg: No edema.     Left lower leg: No edema.     Comments: Right hemiplegia with contractures     Lymphadenopathy:     Cervical: No cervical adenopathy.  Skin:    General: Skin is warm and dry.  Neurological:     Mental Status: She is alert. Mental status is at baseline.  Psychiatric:        Mood and Affect: Mood normal.        ASSESSMENT/ PLAN:  TODAY  Hemiplegia right dominant side as late effect cerebral infarction, unspecified hemiplegia type: has contractures present.   2. Vascular dementia with behavioral disturbance: she has not been weighed in over one year; she continues to have delusions about her stroke; pain.   3. 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   PREVIOUS   4. Noncompliance with treatment: she does not allow for weights; or blood draws; will decline medications at times ; will not get out of bed as well.   5. Dyslipidemia associated with type 2 diabetes  mellitus: off lipitor per her request  6. Chronic constipation: will continue senna s twice daily   7. Hypertension associated with type 2 diabetes mellitus b/p 138/78 will continue lisinopril 10 mg daily and norvasc 10 mg daily   8. Type 2 diabetes mellitus with peripheral arterial artery disease: hgb A1c 6.3 will continue metformin 500 mg twice daily is on ace  9. Major depression with psychotic features: is  on ativan 0.25 mg nightly (has failed one wean) is on buspar 5 mg twice daily is off zoloft per her choice     Synthia Innocent NP [facility]  call [phone]

## 2023-01-21 ENCOUNTER — Other Ambulatory Visit: Payer: Self-pay | Admitting: Adult Health

## 2023-01-21 MED ORDER — LORAZEPAM 0.5 MG PO TABS: 0.2500 mg | ORAL_TABLET | Freq: Every day | ORAL | 0 refills | Status: DC

## 2023-02-20 ENCOUNTER — Encounter: Payer: Self-pay | Admitting: Adult Health

## 2023-02-20 ENCOUNTER — Other Ambulatory Visit: Payer: Self-pay | Admitting: Adult Health

## 2023-02-20 DIAGNOSIS — F22 Delusional disorders: Secondary | ICD-10-CM | POA: Insufficient documentation

## 2023-02-20 MED ORDER — LORAZEPAM 0.5 MG PO TABS: 0.2500 mg | ORAL_TABLET | Freq: Every day | ORAL | 0 refills | Status: DC

## 2023-03-26 ENCOUNTER — Non-Acute Institutional Stay (SKILLED_NURSING_FACILITY): Payer: Medicare Other | Admitting: Internal Medicine

## 2023-03-26 ENCOUNTER — Encounter: Payer: Self-pay | Admitting: Internal Medicine

## 2023-03-26 DIAGNOSIS — M792 Neuralgia and neuritis, unspecified: Secondary | ICD-10-CM | POA: Diagnosis not present

## 2023-03-26 DIAGNOSIS — I69351 Hemiplegia and hemiparesis following cerebral infarction affecting right dominant side: Secondary | ICD-10-CM | POA: Diagnosis not present

## 2023-03-26 DIAGNOSIS — I10 Essential (primary) hypertension: Secondary | ICD-10-CM | POA: Diagnosis not present

## 2023-03-26 DIAGNOSIS — E1151 Type 2 diabetes mellitus with diabetic peripheral angiopathy without gangrene: Secondary | ICD-10-CM

## 2023-03-26 NOTE — Assessment & Plan Note (Addendum)
She has not had her A1c checked in almost 2 years; she states that she has a sugar checked weekly.s  I explained that the A1c defines DM control over several months vs isolated glucose which does not .  She still declines to have A1c checked.
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
## 2023-03-26 NOTE — Patient Instructions (Signed)
See assessment and plan under each diagnosis in the problem list and acutely for this visit 

## 2023-03-26 NOTE — Assessment & Plan Note (Signed)
Trial of Duloxetine declined.

## 2023-03-26 NOTE — Assessment & Plan Note (Addendum)
She complained to me that she needs two antihypertensive pills a day but BP is controlled.  No change indicated; continue to monitor.  Antihypertensive therapy will be adjusted based on BP average rather than on isolated recordings.

## 2023-04-03 ENCOUNTER — Non-Acute Institutional Stay (SKILLED_NURSING_FACILITY): Payer: Medicare Other | Admitting: Adult Health

## 2023-04-03 ENCOUNTER — Encounter: Payer: Self-pay | Admitting: Adult Health

## 2023-04-03 DIAGNOSIS — E785 Hyperlipidemia, unspecified: Secondary | ICD-10-CM

## 2023-04-03 DIAGNOSIS — I152 Hypertension secondary to endocrine disorders: Secondary | ICD-10-CM | POA: Diagnosis not present

## 2023-04-03 DIAGNOSIS — E1169 Type 2 diabetes mellitus with other specified complication: Secondary | ICD-10-CM

## 2023-04-03 DIAGNOSIS — E1159 Type 2 diabetes mellitus with other circulatory complications: Secondary | ICD-10-CM | POA: Diagnosis not present

## 2023-04-03 DIAGNOSIS — F01518 Vascular dementia, unspecified severity, with other behavioral disturbance: Secondary | ICD-10-CM

## 2023-04-03 NOTE — Progress Notes (Signed)
Location:  [facility] Room Number: 123 Place of Service:   SNF    CODE STATUS: dnr   Allergies  Allergen Reactions   Contrast Media [Iodinated Contrast Media] Anaphylaxis   Betamethasone Valerate Other (See Comments)    Chief Complaint  Patient presents with   Acute Visit    Care plan meeting     HPI:  We have come together for her care plan meeting. BIMS 13/15 mood :0/30. She is bed bound with no falls. She requires dependent assist for her adls. She is incontinent of bladder and bowel. Dietary: regular diet; appetite 26-100% setup for meals. Therapy: none at this time. Activities: in room. She continues to be followed for her chronic illnesses including:   Hypertension associated with type 2 diabetes mellitus   Hyperlipidemia associated with type 2 diabetes mellitus   Vascular dementia with behavioral disturbance She declines lab work; weights; will only take certain medications; she remains delusional about her health status including her contractures.   Past Medical History:  Diagnosis Date   Arthritis    Coronary artery disease    angina   Diabetes mellitus with vascular complications    History of CVA (cerebrovascular accident)     Past Surgical History:  Procedure Laterality Date   APPENDECTOMY     CHOLECYSTECTOMY      Social History   Socioeconomic History   Marital status: Widowed    Spouse name: Not on file   Number of children: Not on file   Years of education: Not on file   Highest education level: Not on file  Occupational History   Not on file  Tobacco Use   Smoking status: Never   Smokeless tobacco: Never  Vaping Use   Vaping status: Never Used  Substance and Sexual Activity   Alcohol use: No   Drug use: No   Sexual activity: Not on file  Other Topics Concern   Not on file  Social History Narrative   Not on file   Social Determinants of Health   Financial Resource Strain: Not on file  Food Insecurity: Not on  file  Transportation Needs: Not on file  Physical Activity: Not on file  Stress: Not on file  Social Connections: Not on file  Intimate Partner Violence: Not on file   No family history on file.    VITAL SIGNS BP 135/61   Pulse 62   Temp 97.8 F (36.6 C)   Resp 20   Ht 5\' 4"  (1.626 m)   Wt 164 lb (74.4 kg) Comment: weight is old; does not allow weights  SpO2 97%   BMI 28.15 kg/m   Outpatient Encounter Medications as of 04/03/2023  Medication Sig   acetaminophen (TYLENOL) 325 MG tablet Take 650 mg by mouth every 6 (six) hours as needed.    amLODipine (NORVASC) 10 MG tablet Take 1 tablet (10 mg total) by mouth daily.   Balsam [place]-Castor Oil (VENELEX) OINT Apply topically. Apply to sacrum and bilateral buttocks qshift for prevention.   busPIRone (BUSPAR) 5 MG tablet Take 5 mg by mouth 2 (two) times daily.   lisinopril (ZESTRIL) 10 MG tablet Take 10 mg by mouth daily.   LORazepam (ATIVAN) 0.5 MG tablet Take 0.5 tablets (0.25 mg total) by mouth at bedtime. Give at bedtime   melatonin 5 MG TABS Take 5 mg by mouth at bedtime.   metFORMIN (GLUCOPHAGE) 500 MG tablet Take 1 tablet by mouth 2 (two) times daily.  methocarbamol (ROBAXIN) 500 MG tablet Take 500 mg by mouth every 6 (six) hours as needed.   miconazole (ZEASORB-AF) 2 % powder small amt; topical,As Needed Special Instructions: prn as needed under abd folds/under arms or groin   NON FORMULARY Diet Change: Regular   No facility-administered encounter medications on file as of 04/03/2023.     SIGNIFICANT DIAGNOSTIC EXAMS   PREVIOUS   02-03-20: DEXA t score -1.009   NO NEW EXAMS.   LABS REVIEWED PREVIOUS   declining all lab work   02-02-21: d-dimer 1.80 04-11-21: wbc 8.7; hgb 14.2; hct 43.6; mcv 94.0 plt 217; glucose 143; bun 16; creat 0.60; k+ 4.0; na++ 135; ca 9.0 GFR>60; hgb a1c 6.3; chol 115; ldl 43; trig 134; hdl 47; tsh 1.386 vit B 12: 261 vit D 29.23  NO NEW LABS.   Review of Systems  Constitutional:   Negative for malaise/fatigue.  Respiratory:  Negative for cough and shortness of breath.   Cardiovascular:  Negative for chest pain, palpitations and leg swelling.  Gastrointestinal:  Negative for abdominal pain, constipation and heartburn.  Musculoskeletal:  Positive for back pain, joint pain and myalgias.  Skin: Negative.   Neurological:  Negative for dizziness.  Psychiatric/Behavioral:  The patient is not nervous/anxious.    Physical Exam Constitutional:      General: She is not in acute distress.    Appearance: She is well-developed. She is not diaphoretic.  Neck:     Thyroid: No thyromegaly.  Cardiovascular:     Rate and Rhythm: Normal rate and regular rhythm.     Pulses: Normal pulses.     Heart sounds: Normal heart sounds.  Pulmonary:     Effort: Pulmonary effort is normal. No respiratory distress.     Breath sounds: Normal breath sounds.  Abdominal:     General: Bowel sounds are normal. There is no distension.     Palpations: Abdomen is soft.     Tenderness: There is no abdominal tenderness.  Musculoskeletal:     Cervical back: Neck supple.     Right lower leg: No edema.     Left lower leg: No edema.     Comments: : Right hemiplegia with contractures    Lymphadenopathy:     Cervical: No cervical adenopathy.  Skin:    General: Skin is warm and dry.  Neurological:     Mental Status: She is alert. Mental status is at baseline.  Psychiatric:        Mood and Affect: Mood normal.       ASSESSMENT/ PLAN:  TODAY  Hypertension associated with type 2 diabetes mellitus Hyperlipidemia associated with type 2 diabetes mellitus Vascular dementia with behavioral disturbance  Will continue current medications Will continue current plan of care Will continue to monitor her status.   Time spent with patient: 40 minutes: medications; plan of care dietary    Synthia Innocent NP [facility]   call [phone]

## 2023-04-14 ENCOUNTER — Non-Acute Institutional Stay (SKILLED_NURSING_FACILITY): Payer: Medicare Other | Admitting: Adult Health

## 2023-04-14 ENCOUNTER — Encounter: Payer: Self-pay | Admitting: Adult Health

## 2023-04-14 DIAGNOSIS — E785 Hyperlipidemia, unspecified: Secondary | ICD-10-CM | POA: Diagnosis not present

## 2023-04-14 DIAGNOSIS — E1169 Type 2 diabetes mellitus with other specified complication: Secondary | ICD-10-CM | POA: Diagnosis not present

## 2023-04-14 DIAGNOSIS — Z91199 Patient's noncompliance with other medical treatment and regimen due to unspecified reason: Secondary | ICD-10-CM

## 2023-04-14 DIAGNOSIS — K5909 Other constipation: Secondary | ICD-10-CM

## 2023-04-14 NOTE — Progress Notes (Unsigned)
Location:  [facility] Room Number: 123 Place of Service:  SNF (31)   CODE STATUS: dnr   Allergies  Allergen Reactions   Contrast Media [Iodinated Contrast Media] Anaphylaxis   Betamethasone Valerate Other (See Comments)    Chief Complaint  Patient presents with   Medical Management of Chronic Issues             Noncompliance with treatment:      Dyslipidemia associated with type 2 diabetes mellitus:     Chronic constipation:     HPI:  She is a 89 year old long term resident of this facility being seen for the management of her chronic illnesses:  Noncompliance with treatment:      Dyslipidemia associated with type 2 diabetes mellitus:     Chronic constipation. She does have chronic pain which is presented being treated adequately. She denies any problems with constipation. She continues to decline to get out of bed.   Past Medical History:  Diagnosis Date   Arthritis    Coronary artery disease    angina   Diabetes mellitus with vascular complications    History of CVA (cerebrovascular accident)     Past Surgical History:  Procedure Laterality Date   APPENDECTOMY     CHOLECYSTECTOMY      Social History   Socioeconomic History   Marital status: Widowed    Spouse name: Not on file   Number of children: Not on file   Years of education: Not on file   Highest education level: Not on file  Occupational History   Not on file  Tobacco Use   Smoking status: Never   Smokeless tobacco: Never  Vaping Use   Vaping status: Never Used  Substance and Sexual Activity   Alcohol use: No   Drug use: No   Sexual activity: Not on file  Other Topics Concern   Not on file  Social History Narrative   Not on file   Social Determinants of Health   Financial Resource Strain: Not on file  Food Insecurity: Not on file  Transportation Needs: Not on file  Physical Activity: Not on file  Stress: Not on file  Social Connections: Not on file  Intimate  Partner Violence: Not on file   No family history on file.    VITAL SIGNS BP (!) 129/51   Pulse 64   Temp 98 F (36.7 C)   Resp 20   Ht 5\' 4"  (1.626 m)   Wt 164 lb (74.4 kg)   SpO2 95%   BMI 28.15 kg/m   Outpatient Encounter Medications as of 04/14/2023  Medication Sig   acetaminophen (TYLENOL) 325 MG tablet Take 650 mg by mouth every 6 (six) hours as needed.    amLODipine (NORVASC) 10 MG tablet Take 1 tablet (10 mg total) by mouth daily.   Balsam [place]-Castor Oil (VENELEX) OINT Apply topically. Apply to sacrum and bilateral buttocks qshift for prevention.   busPIRone (BUSPAR) 5 MG tablet Take 5 mg by mouth 2 (two) times daily.   lisinopril (ZESTRIL) 10 MG tablet Take 10 mg by mouth daily.   LORazepam (ATIVAN) 0.5 MG tablet Take 0.5 tablets (0.25 mg total) by mouth at bedtime. Give at bedtime   melatonin 5 MG TABS Take 5 mg by mouth at bedtime.   metFORMIN (GLUCOPHAGE) 500 MG tablet Take 1 tablet by mouth 2 (two) times daily.   methocarbamol (ROBAXIN) 500 MG tablet Take 500 mg by mouth every 6 (  six) hours as needed.   miconazole (ZEASORB-AF) 2 % powder small amt; topical,As Needed Special Instructions: prn as needed under abd folds/under arms or groin   NON FORMULARY Diet Change: Regular   No facility-administered encounter medications on file as of 04/14/2023.     SIGNIFICANT DIAGNOSTIC EXAMS  PREVIOUS   02-03-20: DEXA t score -1.009   NO NEW EXAMS.   LABS REVIEWED PREVIOUS   declining all lab work   02-02-21: d-dimer 1.80 04-11-21: wbc 8.7; hgb 14.2; hct 43.6; mcv 94.0 plt 217; glucose 143; bun 16; creat 0.60; k+ 4.0; na++ 135; ca 9.0 GFR>60; hgb a1c 6.3; chol 115; ldl 43; trig 134; hdl 47; tsh 1.386 vit B 12: 261 vit D 29.23  NO NEW LABS.   Review of Systems  Constitutional:  Negative for malaise/fatigue.  Respiratory:  Negative for cough and shortness of breath.   Cardiovascular:  Negative for chest pain, palpitations and leg swelling.  Gastrointestinal:   Negative for abdominal pain, constipation and heartburn.  Musculoskeletal:  Negative for back pain, joint pain and myalgias.  Skin: Negative.   Neurological:  Negative for dizziness.  Psychiatric/Behavioral:  The patient is not nervous/anxious.     Physical Exam Constitutional:      General: She is not in acute distress.    Appearance: She is well-developed. She is not diaphoretic.  Neck:     Thyroid: No thyromegaly.  Cardiovascular:     Rate and Rhythm: Normal rate and regular rhythm.     Pulses: Normal pulses.     Heart sounds: Normal heart sounds.  Pulmonary:     Effort: Pulmonary effort is normal. No respiratory distress.     Breath sounds: Normal breath sounds.  Abdominal:     General: Bowel sounds are normal. There is no distension.     Palpations: Abdomen is soft.     Tenderness: There is no abdominal tenderness.  Musculoskeletal:     Cervical back: Neck supple.     Right lower leg: No edema.     Left lower leg: No edema.     Comments:  Right hemiplegia with contractures     Lymphadenopathy:     Cervical: No cervical adenopathy.  Skin:    General: Skin is warm and dry.  Neurological:     Mental Status: She is alert. Mental status is at baseline.  Psychiatric:        Mood and Affect: Mood normal.         ASSESSMENT/ PLAN:  TODAY  Noncompliance with treatment: she will not allow for blood draws or weights. She will only take certain medications; will not get out of bed per her choice  2. Dyslipidemia associated with type 2 diabetes mellitus: is off lipitor per her request   3. Chronic constipation: will continue senna s twice daily   PREVIOUS   4. Hypertension associated with type 2 diabetes mellitus b/p 129/51 will continue lisinopril 10 mg daily and norvasc 10 mg daily   5. Type 2 diabetes mellitus with peripheral arterial artery disease: hgb A1c 6.3 will continue metformin 500 mg twice daily is on ace  6. Major depression with psychotic features: is on  ativan 0.25 mg nightly (has failed one wean) is on buspar 5 mg twice daily is off zoloft per her choice   7. 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   8. Vascular dementia with behavioral disturbance: she has not been weighed in over  one year; she continues to have delusions about her stroke; pain.   9. 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     Synthia Innocent NP [facility]  call [phone]

## 2023-04-15 ENCOUNTER — Other Ambulatory Visit: Payer: Self-pay | Admitting: Adult Health

## 2023-04-15 MED ORDER — LORAZEPAM 0.5 MG PO TABS: 0.2500 mg | ORAL_TABLET | Freq: Every day | ORAL | 0 refills | Status: DC

## 2023-04-17 ENCOUNTER — Other Ambulatory Visit: Payer: Self-pay | Admitting: Adult Health

## 2023-05-15 ENCOUNTER — Other Ambulatory Visit: Payer: Self-pay | Admitting: Adult Health

## 2023-05-15 MED ORDER — LORAZEPAM 0.5 MG PO TABS: 0.2500 mg | ORAL_TABLET | Freq: Every day | ORAL | 0 refills | Status: DC

## 2023-05-21 ENCOUNTER — Non-Acute Institutional Stay (SKILLED_NURSING_FACILITY): Payer: Medicare Other | Admitting: Adult Health

## 2023-05-21 ENCOUNTER — Encounter: Payer: Self-pay | Admitting: Adult Health

## 2023-05-21 DIAGNOSIS — F323 Major depressive disorder, single episode, severe with psychotic features: Secondary | ICD-10-CM

## 2023-05-21 DIAGNOSIS — I152 Hypertension secondary to endocrine disorders: Secondary | ICD-10-CM | POA: Diagnosis not present

## 2023-05-21 DIAGNOSIS — E1151 Type 2 diabetes mellitus with diabetic peripheral angiopathy without gangrene: Secondary | ICD-10-CM | POA: Diagnosis not present

## 2023-05-21 DIAGNOSIS — E1159 Type 2 diabetes mellitus with other circulatory complications: Secondary | ICD-10-CM

## 2023-05-21 NOTE — Progress Notes (Unsigned)
Location:  [facility] Room Number: 123 Place of Service:  SNF (31)   CODE STATUS: dnr   Allergies  Allergen Reactions   Contrast Media [Iodinated Contrast Media] Anaphylaxis   Betamethasone Valerate Other (See Comments)    Chief Complaint  Patient presents with   Medical Management of Chronic Issues             Hypertension associated with type 2 diabetes mellitus:    Type 2 diabetes mellitus with peripheral arterial artery disease:    Major depression with psychotic features:     HPI:  She is a 89 year old long term resident of this facility being seen for the management of her chronic illnesses: Hypertension associated with type 2 diabetes mellitus:    Type 2 diabetes mellitus with peripheral arterial artery disease:    Major depression with psychotic features. Her pain is presently managed. She will not allow for blood work; will not take any other medications that she is presently taking. She spends all of her time in bed per her choice.   Past Medical History:  Diagnosis Date   Arthritis    Coronary artery disease    angina   Diabetes mellitus with vascular complications    History of CVA (cerebrovascular accident)     Past Surgical History:  Procedure Laterality Date   APPENDECTOMY     CHOLECYSTECTOMY      Social History   Socioeconomic History   Marital status: Widowed    Spouse name: Not on file   Number of children: Not on file   Years of education: Not on file   Highest education level: Not on file  Occupational History   Not on file  Tobacco Use   Smoking status: Never   Smokeless tobacco: Never  Vaping Use   Vaping status: Never Used  Substance and Sexual Activity   Alcohol use: No   Drug use: No   Sexual activity: Not on file  Other Topics Concern   Not on file  Social History Narrative   Not on file   Social Determinants of Health   Financial Resource Strain: Not on file  Food Insecurity: Not on file   Transportation Needs: Not on file  Physical Activity: Not on file  Stress: Not on file  Social Connections: Not on file  Intimate Partner Violence: Not on file   No family history on file.    VITAL SIGNS BP (!) 131/45   Pulse 60   Temp 97.6 F (36.4 C)   Resp 18   Ht 5\' 4"  (1.626 m)   Wt 164 lb (74.4 kg)   SpO2 98%   BMI 28.15 kg/m   Outpatient Encounter Medications as of 05/21/2023  Medication Sig   acetaminophen (TYLENOL) 325 MG tablet Take 650 mg by mouth every 6 (six) hours as needed.    amLODipine (NORVASC) 10 MG tablet Take 1 tablet (10 mg total) by mouth daily.   Balsam [place]-Castor Oil (VENELEX) OINT Apply topically. Apply to sacrum and bilateral buttocks qshift for prevention.   busPIRone (BUSPAR) 5 MG tablet Take 5 mg by mouth 2 (two) times daily.   lisinopril (ZESTRIL) 10 MG tablet Take 10 mg by mouth daily.   LORazepam (ATIVAN) 0.5 MG tablet Take 0.5 tablets (0.25 mg total) by mouth at bedtime. Give at bedtime   melatonin 5 MG TABS Take 5 mg by mouth at bedtime.   metFORMIN (GLUCOPHAGE) 500 MG tablet Take 1 tablet by  mouth 2 (two) times daily.   methocarbamol (ROBAXIN) 500 MG tablet Take 500 mg by mouth every 6 (six) hours as needed.   miconazole (ZEASORB-AF) 2 % powder small amt; topical,As Needed Special Instructions: prn as needed under abd folds/under arms or groin   NON FORMULARY Diet Change: Regular   No facility-administered encounter medications on file as of 05/21/2023.     SIGNIFICANT DIAGNOSTIC EXAMS  PREVIOUS   02-03-20: DEXA t score -1.009   NO NEW EXAMS.   LABS REVIEWED PREVIOUS   declining all lab work   02-02-21: d-dimer 1.80 04-11-21: wbc 8.7; hgb 14.2; hct 43.6; mcv 94.0 plt 217; glucose 143; bun 16; creat 0.60; k+ 4.0; na++ 135; ca 9.0 GFR>60; hgb a1c 6.3; chol 115; ldl 43; trig 134; hdl 47; tsh 1.386 vit B 12: 261 vit D 29.23  NO NEW LABS.   Review of Systems  Constitutional:  Negative for malaise/fatigue.  Respiratory:   Negative for cough and shortness of breath.   Cardiovascular:  Negative for chest pain, palpitations and leg swelling.  Gastrointestinal:  Negative for abdominal pain, constipation and heartburn.  Musculoskeletal:  Positive for back pain and myalgias. Negative for joint pain.  Skin: Negative.   Neurological:  Negative for dizziness.  Psychiatric/Behavioral:  The patient is not nervous/anxious.    Physical Exam Constitutional:      General: She is not in acute distress.    Appearance: She is well-developed. She is not diaphoretic.  Neck:     Thyroid: No thyromegaly.  Cardiovascular:     Rate and Rhythm: Normal rate and regular rhythm.     Pulses: Normal pulses.     Heart sounds: Normal heart sounds.  Pulmonary:     Effort: Pulmonary effort is normal. No respiratory distress.     Breath sounds: Normal breath sounds.  Abdominal:     General: Bowel sounds are normal. There is no distension.     Palpations: Abdomen is soft.     Tenderness: There is no abdominal tenderness.  Musculoskeletal:     Cervical back: Neck supple.     Right lower leg: No edema.     Left lower leg: No edema.     Comments:  Right hemiplegia with contractures      Lymphadenopathy:     Cervical: No cervical adenopathy.  Skin:    General: Skin is warm and dry.  Neurological:     Mental Status: She is alert. Mental status is at baseline.  Psychiatric:        Mood and Affect: Mood normal.         ASSESSMENT/ PLAN:  TODAY  Hypertension associated with type 2 diabetes mellitus: b/p 131/45: will continue lisinopril 10 mg daily and norvasc 10 mg daily   2. Type 2 diabetes mellitus with peripheral arterial artery disease: hgb A1c 6.3 will continue metformin 500 mg twice daily; is on ace  3. Major depression with psychotic features: is on ativan 0.25 mg nightly (has failed weans) is on buspar 5 mg twice daily; is off zoloft per her request.   PREVIOUS   4. Hemiplegia right dominant side as late effect  cerebral infarction, unspecified hemiplegia type: has contractures present.   5. Vascular dementia with behavioral disturbance: she has not been weighed in over one year; she continues to have delusions about her stroke; pain.   6. 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   7. Noncompliance with treatment: she will not allow  for blood draws or weights. She will only take certain medications; will not get out of bed per her choice  8. Dyslipidemia associated with type 2 diabetes mellitus: is off lipitor per her request   9. Chronic constipation: will continue senna s twice daily     Synthia Innocent NP [facility]   call [phone]

## 2023-06-10 ENCOUNTER — Encounter: Payer: Self-pay | Admitting: Internal Medicine

## 2023-06-10 ENCOUNTER — Non-Acute Institutional Stay (SKILLED_NURSING_FACILITY): Payer: Medicare Other | Admitting: Internal Medicine

## 2023-06-10 DIAGNOSIS — E1151 Type 2 diabetes mellitus with diabetic peripheral angiopathy without gangrene: Secondary | ICD-10-CM | POA: Diagnosis not present

## 2023-06-10 DIAGNOSIS — F22 Delusional disorders: Secondary | ICD-10-CM | POA: Diagnosis not present

## 2023-06-10 DIAGNOSIS — I69351 Hemiplegia and hemiparesis following cerebral infarction affecting right dominant side: Secondary | ICD-10-CM

## 2023-06-10 DIAGNOSIS — F01518 Vascular dementia, unspecified severity, with other behavioral disturbance: Secondary | ICD-10-CM

## 2023-06-10 NOTE — Assessment & Plan Note (Signed)
I offered referral for specialty referrals for assessments of deficits & consideration of Botox injections , but she declines.
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
## 2023-06-10 NOTE — Assessment & Plan Note (Signed)
She refuses Psychiatric assessment.

## 2023-06-10 NOTE — Assessment & Plan Note (Signed)
Present status can not be assessed as she refuses labs.
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
## 2023-06-11 NOTE — Patient Instructions (Signed)
See assessment and plan under each diagnosis in the problem list and acutely for this visit 

## 2023-06-16 ENCOUNTER — Other Ambulatory Visit: Payer: Self-pay | Admitting: Adult Health

## 2023-06-16 MED ORDER — LORAZEPAM 0.5 MG PO TABS: 0.2500 mg | ORAL_TABLET | Freq: Every day | ORAL | 0 refills | Status: DC

## 2023-06-17 ENCOUNTER — Encounter: Payer: Self-pay | Admitting: Adult Health

## 2023-06-17 NOTE — Progress Notes (Unsigned)
   Location:  [facility] Room Number: 123 Place of Service:  SNF (31)   CODE STATUS: ***  Allergies  Allergen Reactions   Contrast Media [Iodinated Contrast Media] Anaphylaxis   Betamethasone Valerate Other (See Comments)    Chief Complaint  Patient presents with   Acute Visit    Care plan meeting     HPI:    Past Medical History:  Diagnosis Date   Arthritis    Coronary artery disease    angina   Diabetes mellitus with vascular complications    History of CVA (cerebrovascular accident)     Past Surgical History:  Procedure Laterality Date   APPENDECTOMY     CHOLECYSTECTOMY      Social History   Socioeconomic History   Marital status: Widowed    Spouse name: Not on file   Number of children: Not on file   Years of education: Not on file   Highest education level: Not on file  Occupational History   Not on file  Tobacco Use   Smoking status: Never   Smokeless tobacco: Never  Vaping Use   Vaping status: Never Used  Substance and Sexual Activity   Alcohol use: No   Drug use: No   Sexual activity: Not on file  Other Topics Concern   Not on file  Social History Narrative   Not on file   Social Determinants of Health   Financial Resource Strain: Not on file  Food Insecurity: Not on file  Transportation Needs: Not on file  Physical Activity: Not on file  Stress: Not on file  Social Connections: Not on file  Intimate Partner Violence: Not on file   No family history on file.    VITAL SIGNS BP (!) 128/53   Pulse 60   Temp 97.8 F (36.6 C)   Resp 20   Ht 5\' 4"  (1.626 m)   Wt 164 lb (74.4 kg)   SpO2 96%   BMI 28.15 kg/m   Outpatient Encounter Medications as of 06/18/2023  Medication Sig   acetaminophen (TYLENOL) 325 MG tablet Take 650 mg by mouth every 6 (six) hours as needed.    amLODipine (NORVASC) 10 MG tablet Take 1 tablet (10 mg total) by mouth daily.   Balsam [place]-Castor Oil (VENELEX) OINT Apply topically.  Apply to sacrum and bilateral buttocks qshift for prevention.   busPIRone (BUSPAR) 5 MG tablet Take 5 mg by mouth 2 (two) times daily.   lisinopril (ZESTRIL) 10 MG tablet Take 10 mg by mouth daily.   LORazepam (ATIVAN) 0.5 MG tablet Take 0.5 tablets (0.25 mg total) by mouth at bedtime. Give at bedtime   melatonin 5 MG TABS Take 5 mg by mouth at bedtime.   metFORMIN (GLUCOPHAGE) 500 MG tablet Take 1 tablet by mouth 2 (two) times daily.   methocarbamol (ROBAXIN) 500 MG tablet Take 500 mg by mouth every 6 (six) hours as needed.   miconazole (ZEASORB-AF) 2 % powder small amt; topical,As Needed Special Instructions: prn as needed under abd folds/under arms or groin   NON FORMULARY Diet Change: Regular   No facility-administered encounter medications on file as of 06/18/2023.     SIGNIFICANT DIAGNOSTIC EXAMS       ASSESSMENT/ PLAN:     Synthia Innocent NP [facility]   call [phone]

## 2023-06-18 ENCOUNTER — Encounter: Payer: Self-pay | Admitting: Adult Health

## 2023-06-18 ENCOUNTER — Non-Acute Institutional Stay (SKILLED_NURSING_FACILITY): Payer: Medicare Other | Admitting: Adult Health

## 2023-06-18 DIAGNOSIS — I152 Hypertension secondary to endocrine disorders: Secondary | ICD-10-CM

## 2023-06-18 DIAGNOSIS — E1159 Type 2 diabetes mellitus with other circulatory complications: Secondary | ICD-10-CM | POA: Diagnosis not present

## 2023-06-18 DIAGNOSIS — I69351 Hemiplegia and hemiparesis following cerebral infarction affecting right dominant side: Secondary | ICD-10-CM

## 2023-06-18 DIAGNOSIS — F22 Delusional disorders: Secondary | ICD-10-CM | POA: Diagnosis not present

## 2023-06-18 NOTE — Progress Notes (Signed)
Location:  [facility] Room Number: 121 Place of Service:  SNF (31)   CODE STATUS: dnr   Allergies  Allergen Reactions   Contrast Media [Iodinated Contrast Media] Anaphylaxis   Betamethasone Valerate Other (See Comments)    Chief Complaint  Patient presents with   Acute Visit    Care plan meeting.     HPI:  We have come together for her care plan meeting. BIMS 11/15 mood 6/30: not eating well; decreased energy; she does decline care on a frequent basis. She is bed bound without falls present. She is dependent assist with her adl care.  She is incontinent of bladder and bowel. Dietary: regular diet feeds self; has declined weights for the past year. Appetite 26-100 %. Therapy: none at this time. She will continue to be followed for her chronic illnesses including:  Hypertension associated with type 2 diabetes mellitus  Spastic hemiplegia of right dominant side as late effect cva     Delusional disorder  Past Medical History:  Diagnosis Date   Arthritis    Coronary artery disease    angina   Diabetes mellitus with vascular complications    History of CVA (cerebrovascular accident)     Past Surgical History:  Procedure Laterality Date   APPENDECTOMY     CHOLECYSTECTOMY      Social History   Socioeconomic History   Marital status: Widowed    Spouse name: Not on file   Number of children: Not on file   Years of education: Not on file   Highest education level: Not on file  Occupational History   Not on file  Tobacco Use   Smoking status: Never   Smokeless tobacco: Never  Vaping Use   Vaping status: Never Used  Substance and Sexual Activity   Alcohol use: No   Drug use: No   Sexual activity: Not on file  Other Topics Concern   Not on file  Social History Narrative   Not on file   Social Determinants of Health   Financial Resource Strain: Not on file  Food Insecurity: Not on file  Transportation Needs: Not on file  Physical  Activity: Not on file  Stress: Not on file  Social Connections: Not on file  Intimate Partner Violence: Not on file   No family history on file.    VITAL SIGNS BP (!) 128/53   Pulse 60   Temp 97.8 F (36.6 C)   Resp 20   Ht 5\' 4"  (1.626 m)   Wt 164 lb (74.4 kg)   SpO2 96%   BMI 28.15 kg/m   Outpatient Encounter Medications as of 06/18/2023  Medication Sig   acetaminophen (TYLENOL) 325 MG tablet Take 650 mg by mouth every 6 (six) hours as needed.    amLODipine (NORVASC) 10 MG tablet Take 1 tablet (10 mg total) by mouth daily.   Balsam [place]-Castor Oil (VENELEX) OINT Apply topically. Apply to sacrum and bilateral buttocks qshift for prevention.   busPIRone (BUSPAR) 5 MG tablet Take 5 mg by mouth 2 (two) times daily.   lisinopril (ZESTRIL) 10 MG tablet Take 10 mg by mouth daily.   LORazepam (ATIVAN) 0.5 MG tablet Take 0.5 tablets (0.25 mg total) by mouth at bedtime. Give at bedtime   melatonin 5 MG TABS Take 5 mg by mouth at bedtime.   metFORMIN (GLUCOPHAGE) 500 MG tablet Take 1 tablet by mouth 2 (two) times daily.   methocarbamol (ROBAXIN) 500 MG tablet Take 500  mg by mouth every 6 (six) hours as needed.   miconazole (ZEASORB-AF) 2 % powder small amt; topical,As Needed Special Instructions: prn as needed under abd folds/under arms or groin   NON FORMULARY Diet Change: Regular   No facility-administered encounter medications on file as of 06/18/2023.     SIGNIFICANT DIAGNOSTIC EXAMS  PREVIOUS   02-03-20: DEXA t score -1.009   NO NEW EXAMS.   LABS REVIEWED PREVIOUS   declining all lab work   02-02-21: d-dimer 1.80 04-11-21: wbc 8.7; hgb 14.2; hct 43.6; mcv 94.0 plt 217; glucose 143; bun 16; creat 0.60; k+ 4.0; na++ 135; ca 9.0 GFR>60; hgb a1c 6.3; chol 115; ldl 43; trig 134; hdl 47; tsh 1.386 vit B 12: 261 vit D 29.23  NO NEW LABS.   Review of Systems  Constitutional:  Negative for malaise/fatigue.  Respiratory:  Negative for cough and shortness of breath.    Cardiovascular:  Negative for chest pain, palpitations and leg swelling.  Gastrointestinal:  Negative for abdominal pain, constipation and heartburn.  Musculoskeletal:  Negative for back pain, joint pain and myalgias.  Skin: Negative.   Neurological:  Negative for dizziness.  Psychiatric/Behavioral:  The patient is not nervous/anxious.    Physical Exam Constitutional:      General: She is not in acute distress.    Appearance: She is well-developed. She is not diaphoretic.  Neck:     Thyroid: No thyromegaly.  Cardiovascular:     Rate and Rhythm: Normal rate and regular rhythm.     Pulses: Normal pulses.     Heart sounds: Normal heart sounds.  Pulmonary:     Effort: Pulmonary effort is normal. No respiratory distress.     Breath sounds: Normal breath sounds.  Abdominal:     General: Bowel sounds are normal. There is no distension.     Palpations: Abdomen is soft.     Tenderness: There is no abdominal tenderness.  Musculoskeletal:     Cervical back: Neck supple.     Right lower leg: No edema.     Left lower leg: No edema.     Comments: Right hemiplegia with contractures   Lymphadenopathy:     Cervical: No cervical adenopathy.  Skin:    General: Skin is warm and dry.  Neurological:     Mental Status: She is alert. Mental status is at baseline.  Psychiatric:        Mood and Affect: Mood normal.     ASSESSMENT/ PLAN:  TODAY  Hypertension associated with type 2 diabetes mellitus Spastic hemiplegia of right dominant side as late effect cva Delusional disorder  Will continue current medications Will continue current plan of care Will continue to monitor her status.   Time spent with patient 40 minutes: medications; plan of care declining care.    [name] Innocent NP [facility]  call [phone]

## 2023-06-30 ENCOUNTER — Encounter: Payer: Self-pay | Admitting: Internal Medicine

## 2023-06-30 ENCOUNTER — Non-Acute Institutional Stay (SKILLED_NURSING_FACILITY): Payer: Self-pay | Admitting: Internal Medicine

## 2023-06-30 DIAGNOSIS — F22 Delusional disorders: Secondary | ICD-10-CM | POA: Diagnosis not present

## 2023-06-30 DIAGNOSIS — T300 Burn of unspecified body region, unspecified degree: Secondary | ICD-10-CM | POA: Diagnosis not present
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
## 2023-07-01 ENCOUNTER — Encounter: Payer: Self-pay | Admitting: Internal Medicine

## 2023-07-01 NOTE — Patient Instructions (Signed)
See assessment and plan under each diagnosis in the problem list and acutely for this visit 

## 2023-07-11 ENCOUNTER — Other Ambulatory Visit: Payer: Self-pay | Admitting: Adult Health

## 2023-07-11 MED ORDER — LORAZEPAM 0.5 MG PO TABS: 0.2500 mg | ORAL_TABLET | Freq: Every day | ORAL | 0 refills | Status: DC

## 2023-07-17 ENCOUNTER — Non-Acute Institutional Stay (SKILLED_NURSING_FACILITY): Payer: Medicare Other | Admitting: Adult Health

## 2023-07-17 ENCOUNTER — Encounter: Payer: Self-pay | Admitting: Adult Health

## 2023-07-17 DIAGNOSIS — I69391 Dysphagia following cerebral infarction: Secondary | ICD-10-CM

## 2023-07-17 DIAGNOSIS — I69351 Hemiplegia and hemiparesis following cerebral infarction affecting right dominant side: Secondary | ICD-10-CM | POA: Diagnosis not present

## 2023-07-17 DIAGNOSIS — F01518 Vascular dementia, unspecified severity, with other behavioral disturbance: Secondary | ICD-10-CM

## 2023-07-17 NOTE — Progress Notes (Unsigned)
   Location:  [facility] Room Number: 122 Place of Service:  SNF (31)   CODE STATUS: ***  Allergies  Allergen Reactions   Contrast Media [Iodinated Contrast Media] Anaphylaxis   Betamethasone Valerate Other (See Comments)    Chief Complaint  Patient presents with   Medical Management of Chronic Issues    HPI:    Past Medical History:  Diagnosis Date   Arthritis    Coronary artery disease    angina   Diabetes mellitus with vascular complications    History of CVA (cerebrovascular accident)     Past Surgical History:  Procedure Laterality Date   APPENDECTOMY     CHOLECYSTECTOMY      Social History   Socioeconomic History   Marital status: Widowed    Spouse name: Not on file   Number of children: Not on file   Years of education: Not on file   Highest education level: Not on file  Occupational History   Not on file  Tobacco Use   Smoking status: Never   Smokeless tobacco: Never  Vaping Use   Vaping status: Never Used  Substance and Sexual Activity   Alcohol use: No   Drug use: No   Sexual activity: Not on file  Other Topics Concern   Not on file  Social History Narrative   Not on file   Social Drivers of Health   Financial Resource Strain: Not on file  Food Insecurity: Not on file  Transportation Needs: Not on file  Physical Activity: Not on file  Stress: Not on file  Social Connections: Not on file  Intimate Partner Violence: Not on file   No family history on file.    VITAL SIGNS BP (!) 118/57   Pulse (!) 57   Temp 97.8 F (36.6 C)   Resp 18   SpO2 97%   Outpatient Encounter Medications as of 07/17/2023  Medication Sig   acetaminophen (TYLENOL) 325 MG tablet Take 650 mg by mouth every 6 (six) hours as needed.    amLODipine (NORVASC) 10 MG tablet Take 1 tablet (10 mg total) by mouth daily.   Balsam [place]-Castor Oil (VENELEX) OINT Apply topically. Apply to sacrum and bilateral buttocks qshift for prevention.    busPIRone (BUSPAR) 5 MG tablet Take 5 mg by mouth 2 (two) times daily.   lisinopril (ZESTRIL) 10 MG tablet Take 10 mg by mouth daily.   LORazepam (ATIVAN) 0.5 MG tablet Take 0.5 tablets (0.25 mg total) by mouth at bedtime. Give at bedtime   melatonin 5 MG TABS Take 5 mg by mouth at bedtime.   metFORMIN (GLUCOPHAGE) 500 MG tablet Take 1 tablet by mouth 2 (two) times daily.   methocarbamol (ROBAXIN) 500 MG tablet Take 500 mg by mouth every 6 (six) hours as needed.   miconazole (ZEASORB-AF) 2 % powder small amt; topical,As Needed Special Instructions: prn as needed under abd folds/under arms or groin   NON FORMULARY Diet Change: Regular   No facility-administered encounter medications on file as of 07/17/2023.     SIGNIFICANT DIAGNOSTIC EXAMS       ASSESSMENT/ PLAN:     Synthia Innocent NP [facility]  Contact [phone] Monday through Friday 8am- 5pm  After hours call [phone]

## 2023-07-25 ENCOUNTER — Other Ambulatory Visit: Payer: Self-pay | Admitting: Adult Health

## 2023-08-12 ENCOUNTER — Other Ambulatory Visit: Payer: Self-pay | Admitting: Adult Health

## 2023-08-12 MED ORDER — LORAZEPAM 0.5 MG PO TABS: 0.2500 mg | ORAL_TABLET | Freq: Every day | ORAL | 0 refills | Status: DC

## 2023-08-21 ENCOUNTER — Encounter: Payer: Self-pay | Admitting: Adult Health

## 2023-08-21 ENCOUNTER — Non-Acute Institutional Stay (SKILLED_NURSING_FACILITY): Payer: Medicare Other | Admitting: Adult Health

## 2023-08-21 DIAGNOSIS — K5909 Other constipation: Secondary | ICD-10-CM

## 2023-08-21 DIAGNOSIS — E785 Hyperlipidemia, unspecified: Secondary | ICD-10-CM | POA: Diagnosis not present

## 2023-08-21 DIAGNOSIS — Z91199 Patient's noncompliance with other medical treatment and regimen due to unspecified reason: Secondary | ICD-10-CM | POA: Diagnosis not present

## 2023-08-21 DIAGNOSIS — E1169 Type 2 diabetes mellitus with other specified complication: Secondary | ICD-10-CM | POA: Diagnosis not present

## 2023-08-21 NOTE — Progress Notes (Unsigned)
  Location:  [facility] Room Number: 123 Place of Service:  SNF (31)   CODE STATUS: DNR  Allergies  Allergen Reactions   Contrast Media [Iodinated Contrast Media] Anaphylaxis   Betamethasone Valerate Other (See Comments)    Chief Complaint  Patient presents with   Medical Management of Chronic Issues    Routine Visit    Immunizations    Pneumonia     HPI:    Past Medical History:  Diagnosis Date   Arthritis    Coronary artery disease    angina   Diabetes mellitus with vascular complications    History of CVA (cerebrovascular accident)     Past Surgical History:  Procedure Laterality Date   APPENDECTOMY     CHOLECYSTECTOMY      Social History   Socioeconomic History   Marital status: Widowed    Spouse name: Not on file   Number of children: Not on file   Years of education: Not on file   Highest education level: Not on file  Occupational History   Not on file  Tobacco Use   Smoking status: Never   Smokeless tobacco: Never  Vaping Use   Vaping status: Never Used  Substance and Sexual Activity   Alcohol use: No   Drug use: No   Sexual activity: Not on file  Other Topics Concern   Not on file  Social History Narrative   Not on file   Social Drivers of Health   Financial Resource Strain: Not on file  Food Insecurity: Not on file  Transportation Needs: Not on file  Physical Activity: Not on file  Stress: Not on file  Social Connections: Not on file  Intimate Partner Violence: Not on file   History reviewed. No pertinent family history.    VITAL SIGNS BP (!) 130/53   Pulse (!) 59   Temp (!) 97.3 F (36.3 C)   Resp 20   Ht 5' 3.2" (1.605 m)   Wt 164 lb (74.4 kg)   SpO2 96%   BMI 28.87 kg/m   Outpatient Encounter Medications as of 08/21/2023  Medication Sig   acetaminophen (TYLENOL) 325 MG tablet Take 650 mg by mouth every 6 (six) hours as needed.    amLODipine (NORVASC) 10 MG tablet Take 1 tablet (10 mg total)  by mouth daily.   Balsam [place]-Castor Oil (VENELEX) OINT Apply topically. Apply to sacrum and bilateral buttocks qshift for prevention.   busPIRone (BUSPAR) 5 MG tablet Take 5 mg by mouth 2 (two) times daily.   lisinopril (ZESTRIL) 10 MG tablet Take 10 mg by mouth daily.   LORazepam (ATIVAN) 0.5 MG tablet Take 0.5 tablets (0.25 mg total) by mouth at bedtime. Give at bedtime   melatonin 5 MG TABS Take 5 mg by mouth at bedtime.   metFORMIN (GLUCOPHAGE) 500 MG tablet Take 1 tablet by mouth 2 (two) times daily.   methocarbamol (ROBAXIN) 500 MG tablet Take 500 mg by mouth every 6 (six) hours as needed.   miconazole (ZEASORB-AF) 2 % powder small amt; topical,As Needed Special Instructions: prn as needed under abd folds/under arms or groin   NON FORMULARY Diet Change: Regular   No facility-administered encounter medications on file as of 08/21/2023.     SIGNIFICANT DIAGNOSTIC EXAMS       ASSESSMENT/ PLAN:     Synthia Innocent NP [facility]  Contact [phone] Monday through Friday 8am- 5pm  After hours call [phone]

## 2023-09-15 ENCOUNTER — Other Ambulatory Visit: Payer: Self-pay | Admitting: Adult Health

## 2023-09-15 MED ORDER — LORAZEPAM 0.5 MG PO TABS: 0.2500 mg | ORAL_TABLET | Freq: Every day | ORAL | 0 refills | Status: DC

## 2023-09-17 ENCOUNTER — Encounter: Payer: Self-pay | Admitting: Adult Health

## 2023-09-17 ENCOUNTER — Non-Acute Institutional Stay (SKILLED_NURSING_FACILITY): Payer: Self-pay | Admitting: Adult Health

## 2023-09-17 DIAGNOSIS — F323 Major depressive disorder, single episode, severe with psychotic features: Secondary | ICD-10-CM

## 2023-09-17 DIAGNOSIS — E1159 Type 2 diabetes mellitus with other circulatory complications: Secondary | ICD-10-CM

## 2023-09-17 DIAGNOSIS — I152 Hypertension secondary to endocrine disorders: Secondary | ICD-10-CM | POA: Diagnosis not present

## 2023-09-17 DIAGNOSIS — E1151 Type 2 diabetes mellitus with diabetic peripheral angiopathy without gangrene: Secondary | ICD-10-CM | POA: Diagnosis not present

## 2023-09-17 NOTE — Progress Notes (Signed)
 Location:  [facility] Room Number: 122 Place of Service:  SNF ([id number])   CODE STATUS: dnr   Allergies  Allergen Reactions   Contrast Media [Iodinated Contrast Media] Anaphylaxis   Betamethasone Valerate Other (See Comments)    Chief Complaint  Patient presents with   Medical Management of Chronic Issues          Hypertension associated with type 2 diabetes mellitus: Type 2 diabetes mellitus with peripheral artery disease: Major depression with psychotic features     HPI:  She is a 89 year old long term resident of this facility being seen for the management of her chronic illnesses:  Hypertension associated with type 2 diabetes mellitus: Type 2 diabetes mellitus with peripheral artery disease: Major depression with psychotic features. She does spend all of her time in bed. She continues with delusional thoughts regarding her history of stroke. She does not allow for weights.   Past Medical History:  Diagnosis Date   Arthritis    Coronary artery disease    angina   Diabetes mellitus with vascular complications    History of CVA (cerebrovascular accident)     Past Surgical History:  Procedure Laterality Date   APPENDECTOMY     CHOLECYSTECTOMY      Social History   Socioeconomic History   Marital status: Widowed    Spouse name: Not on file   Number of children: Not on file   Years of education: Not on file   Highest education level: Not on file  Occupational History   Not on file  Tobacco Use   Smoking status: Never   Smokeless tobacco: Never  Vaping Use   Vaping status: Never Used  Substance and Sexual Activity   Alcohol use: No   Drug use: No   Sexual activity: Not on file  Other Topics Concern   Not on file  Social History Narrative   Not on file   Social Drivers of Health   Financial Resource Strain: Not on file  Food Insecurity: Not on file  Transportation Needs: Not on file  Physical Activity: Not on file  Stress: Not on  file  Social Connections: Not on file  Intimate Partner Violence: Not on file   No family history on file.    VITAL SIGNS BP 113/80   Pulse (!) 55   Temp (!) 97.4 F (36.3 C)   Resp 20   Ht 5\' 3"  (1.6 m)   Wt 164 lb (74.4 kg)   SpO2 94%   BMI 29.05 kg/m   Outpatient Encounter Medications as of 09/17/2023  Medication Sig   acetaminophen (TYLENOL) 325 MG tablet Take 650 mg by mouth every 6 (six) hours as needed.    amLODipine (NORVASC) 10 MG tablet Take 1 tablet (10 mg total) by mouth daily.   Balsam [place]-Castor Oil (VENELEX) OINT Apply topically. Apply to sacrum and bilateral buttocks qshift for prevention.   busPIRone (BUSPAR) 5 MG tablet Take 5 mg by mouth 2 (two) times daily.   lisinopril (ZESTRIL) 10 MG tablet Take 10 mg by mouth daily.   LORazepam (ATIVAN) 0.5 MG tablet Take 0.5 tablets (0.25 mg total) by mouth at bedtime. Give at bedtime   melatonin 5 MG TABS Take 5 mg by mouth at bedtime.   metFORMIN (GLUCOPHAGE) 500 MG tablet Take 1 tablet by mouth 2 (two) times daily.   methocarbamol (ROBAXIN) 500 MG tablet Take 500 mg by mouth every 6 (six) hours as needed.  miconazole (ZEASORB-AF) 2 % powder small amt; topical,As Needed Special Instructions: prn as needed under abd folds/under arms or groin   NON FORMULARY Diet Change: Regular   No facility-administered encounter medications on file as of 09/17/2023.     SIGNIFICANT DIAGNOSTIC EXAMS  NO NEW EXAMS.   LABS REVIEWED PREVIOUS   declining all lab work   Review of Systems  Constitutional:  Negative for malaise/fatigue.  Respiratory:  Negative for cough and shortness of breath.   Cardiovascular:  Negative for chest pain, palpitations and leg swelling.  Gastrointestinal:  Negative for abdominal pain, constipation and heartburn.  Musculoskeletal:  Positive for back pain, joint pain and myalgias.  Skin: Negative.   Neurological:  Negative for dizziness.  Psychiatric/Behavioral:  The patient is not nervous/anxious.      Physical Exam Constitutional:      General: She is not in acute distress.    Appearance: She is well-developed. She is not diaphoretic.  Neck:     Thyroid: No thyromegaly.  Cardiovascular:     Rate and Rhythm: Normal rate and regular rhythm.     Heart sounds: Normal heart sounds.  Pulmonary:     Effort: Pulmonary effort is normal. No respiratory distress.     Breath sounds: Normal breath sounds.  Abdominal:     General: Bowel sounds are normal. There is no distension.     Palpations: Abdomen is soft.     Tenderness: There is no abdominal tenderness.  Musculoskeletal:     Cervical back: Neck supple.     Right lower leg: No edema.     Left lower leg: No edema.     Comments: Right hemiplegia with contractures    Lymphadenopathy:     Cervical: No cervical adenopathy.  Skin:    General: Skin is warm and dry.  Neurological:     Mental Status: She is alert. Mental status is at baseline.  Psychiatric:        Mood and Affect: Mood normal.       ASSESSMENT/ PLAN:  TODAY  Hypertension associated with type 2 diabetes mellitus: b/p 113/80: will continue lisinopril 10 mg daily and norvasc 10 mg daily  2. Type 2 diabetes mellitus with peripheral artery disease: unknown status; won't allow blood work; will continue metformin 500 mg twice daily is on ace   3. Major depression with psychotic features: is on ativan 0.25 mg nightly (has failed weans); buspar 5 mg twice daily and is off zoloft per her choice.   PREVIOUS    4. Hemiplegia right dominant side as late effect of cerebral infarction (11-01-20): has contractures present.   5. Vascular dementia with behavioral disturbance: she has not been weighed in over one year; continues to have delusions about her stroke  6. Dysphagia due to CVA: without signs of aspiration: is on thin liquids.   7. Noncompliance with treatment: she will not allow for blood draws or weights. She will only take certain medications. Will not get out of  bed per her choice.   8. Dyslipidemia associated with type 2 diabetes mellitus: is off lipitor per her choice   9. Chronic constipation: will continue senna s twice daily    Synthia Innocent NP [facility]   call [phone]

## 2023-09-18 ENCOUNTER — Encounter: Payer: Self-pay | Admitting: Adult Health

## 2023-09-18 ENCOUNTER — Non-Acute Institutional Stay (SKILLED_NURSING_FACILITY): Payer: Self-pay | Admitting: Adult Health

## 2023-09-18 DIAGNOSIS — F22 Delusional disorders: Secondary | ICD-10-CM

## 2023-09-18 DIAGNOSIS — Z8673 Personal history of transient ischemic attack (TIA), and cerebral infarction without residual deficits: Secondary | ICD-10-CM | POA: Diagnosis not present

## 2023-09-18 DIAGNOSIS — F323 Major depressive disorder, single episode, severe with psychotic features: Secondary | ICD-10-CM | POA: Diagnosis not present

## 2023-09-18 NOTE — Progress Notes (Signed)
 Location:  [facility] Room Number: [id number] P Place of Service:  SNF (31) Adarius Tigges S,NP  CODE STATUS: DNR  Allergies  Allergen Reactions   Contrast Media [Iodinated Contrast Media] Anaphylaxis   Betamethasone Valerate Other (See Comments)    Chief Complaint  Patient presents with   Acute Visit    Care plan meeting.    HPI:  We have come together for her care plan meeting. BIMS 15/15 mood 3/30: nervous daily; delusions; declines care; stating "I am dead". Is in bed at all times per choice; no falls. She requires dependent assist with her adl care. She is incontinent of bladder and bowel. Dietary: no weights in the past 2 years. Feeds self regular diet; appetite 26-100%. Therapy: none at this time. She will continue to be followed for her chronic illnesses: Major depression with psychotic features   Delusional disorder   History of cva  Past Medical History:  Diagnosis Date   Arthritis    Coronary artery disease    angina   Diabetes mellitus with vascular complications    History of CVA (cerebrovascular accident)     Past Surgical History:  Procedure Laterality Date   APPENDECTOMY     CHOLECYSTECTOMY      Social History   Socioeconomic History   Marital status: Widowed    Spouse name: Not on file   Number of children: Not on file   Years of education: Not on file   Highest education level: Not on file  Occupational History   Not on file  Tobacco Use   Smoking status: Never   Smokeless tobacco: Never  Vaping Use   Vaping status: Never Used  Substance and Sexual Activity   Alcohol use: No   Drug use: No   Sexual activity: Not on file  Other Topics Concern   Not on file  Social History Narrative   Not on file   Social Drivers of Health   Financial Resource Strain: Not on file  Food Insecurity: Not on file  Transportation Needs: Not on file  Physical Activity: Not on file  Stress: Not on file  Social Connections: Not on file   Intimate Partner Violence: Not on file   No family history on file.    VITAL SIGNS BP 113/80   Pulse (!) 55   Temp (!) 97.4 F (36.3 C)   Resp 20   Ht 5\' 3"  (1.6 m)   Wt 164 lb (74.4 kg)   SpO2 94%   BMI 29.05 kg/m   Outpatient Encounter Medications as of 09/18/2023  Medication Sig   acetaminophen (TYLENOL) 325 MG tablet Take 650 mg by mouth every 6 (six) hours as needed.    amLODipine (NORVASC) 10 MG tablet Take 1 tablet (10 mg total) by mouth daily.   Balsam [place]-Castor Oil (VENELEX) OINT Apply topically. Apply to sacrum and bilateral buttocks qshift for prevention.   busPIRone (BUSPAR) 5 MG tablet Take 5 mg by mouth 2 (two) times daily.   lisinopril (ZESTRIL) 10 MG tablet Take 10 mg by mouth daily.   LORazepam (ATIVAN) 0.5 MG tablet Take 0.5 tablets (0.25 mg total) by mouth at bedtime. Give at bedtime   melatonin 5 MG TABS Take 5 mg by mouth at bedtime.   metFORMIN (GLUCOPHAGE) 500 MG tablet Take 1 tablet by mouth 2 (two) times daily.   methocarbamol (ROBAXIN) 500 MG tablet Take 500 mg by mouth every 6 (six) hours as needed.   miconazole (ZEASORB-AF) 2 %  powder small amt; topical,As Needed Special Instructions: prn as needed under abd folds/under arms or groin   NON FORMULARY Diet Change: Regular   No facility-administered encounter medications on file as of 09/18/2023.     SIGNIFICANT DIAGNOSTIC EXAMS  LABS REVIEWED PREVIOUS   declining all lab work   02-02-21: d-dimer 1.80 04-11-21: wbc 8.7; hgb 14.2; hct 43.6; mcv 94.0 plt 217; glucose 143; bun 16; creat 0.60; k+ 4.0; na++ 135; ca 9.0 GFR>60; hgb a1c 6.3; chol 115; ldl 43; trig 134; hdl 47; tsh 1.386 vit B 12: 261 vit D 29.23  NO NEW LABS.   Review of Systems  Constitutional:  Negative for malaise/fatigue.  Respiratory:  Negative for cough and shortness of breath.   Cardiovascular:  Negative for chest pain, palpitations and leg swelling.  Gastrointestinal:  Negative for abdominal pain, constipation and heartburn.   Musculoskeletal:  Positive for back pain, joint pain and myalgias.  Skin: Negative.   Neurological:  Negative for dizziness.  Psychiatric/Behavioral:  The patient is not nervous/anxious.    Physical Exam Constitutional:      General: She is not in acute distress.    Appearance: She is well-developed. She is not diaphoretic.  Neck:     Thyroid: No thyromegaly.  Cardiovascular:     Rate and Rhythm: Normal rate and regular rhythm.     Heart sounds: Normal heart sounds.  Pulmonary:     Effort: Pulmonary effort is normal. No respiratory distress.     Breath sounds: Normal breath sounds.  Abdominal:     General: Bowel sounds are normal. There is no distension.     Palpations: Abdomen is soft.     Tenderness: There is no abdominal tenderness.  Musculoskeletal:     Cervical back: Neck supple.     Right lower leg: No edema.     Left lower leg: No edema.     Comments: Right hemiplegia with contractures   Lymphadenopathy:     Cervical: No cervical adenopathy.  Skin:    General: Skin is warm and dry.  Neurological:     Mental Status: She is alert. Mental status is at baseline.  Psychiatric:        Mood and Affect: Mood normal.     ASSESSMENT/ PLAN:  TODAY  Major depression with psychotic features Delusional disorder History of cva  Will continue current medications Will continue current plan of care Will continue to monitor her status.   Time spent with patient 40 minutes: medications; plan of care; dietary.    Synthia Innocent NP [facility]  call [phone]

## 2023-10-10 ENCOUNTER — Other Ambulatory Visit: Payer: Self-pay | Admitting: Adult Health

## 2023-10-10 MED ORDER — LORAZEPAM 0.5 MG PO TABS: 0.2500 mg | ORAL_TABLET | Freq: Every day | ORAL | 0 refills | Status: DC

## 2023-10-15 ENCOUNTER — Non-Acute Institutional Stay (SKILLED_NURSING_FACILITY): Payer: Self-pay | Admitting: Internal Medicine

## 2023-10-15 ENCOUNTER — Encounter: Payer: Self-pay | Admitting: Internal Medicine

## 2023-10-15 DIAGNOSIS — F01518 Vascular dementia, unspecified severity, with other behavioral disturbance: Secondary | ICD-10-CM | POA: Diagnosis not present

## 2023-10-15 DIAGNOSIS — E1159 Type 2 diabetes mellitus with other circulatory complications: Secondary | ICD-10-CM | POA: Diagnosis not present

## 2023-10-15 DIAGNOSIS — I69351 Hemiplegia and hemiparesis following cerebral infarction affecting right dominant side: Secondary | ICD-10-CM | POA: Diagnosis not present

## 2023-10-15 DIAGNOSIS — I152 Hypertension secondary to endocrine disorders: Secondary | ICD-10-CM | POA: Diagnosis not present
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
## 2023-10-15 NOTE — Assessment & Plan Note (Addendum)
 Serial blood pressures reviewed; today's blood pressure is an outlier.  Blood pressures range from 113 up to a max of 139 systolic.  Diabetic status cannot be determined as she declines monitoring of A1c.  She is on metformin 500 mg twice daily.  For the same reason current renal status cannot be assessed. I discussed "shared decision making" and standard of care in reference to disease state monitor; she continues to decline any surveillance.

## 2023-10-15 NOTE — Assessment & Plan Note (Signed)
 Hemiplegia is unchanged.  I gave her the option of physical medicine referral for possible Botox injection consideration; she stated she would consider this only if the specialist came here.
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
## 2023-10-15 NOTE — Patient Instructions (Signed)
 See assessment and plan under each diagnosis in the problem list and acutely for this visit

## 2023-11-11 ENCOUNTER — Non-Acute Institutional Stay (SKILLED_NURSING_FACILITY): Payer: Self-pay | Admitting: Adult Health

## 2023-11-11 ENCOUNTER — Encounter: Payer: Self-pay | Admitting: Adult Health

## 2023-11-11 DIAGNOSIS — Z0000 Encounter for general adult medical examination without abnormal findings: Secondary | ICD-10-CM

## 2023-11-11 NOTE — Progress Notes (Signed)
 Subjective:   [name] [name] is a 89 y.o. female who presents for Medicare Annual (Subsequent) preventive examination.  Visit Complete: In person  Patient Medicare AWV questionnaire was completed by the patient on 11-11-2023; I have confirmed that all information answered by patient is correct and no changes since this date.  Cardiac Risk Factors include: advanced age (>103men, >61 women);diabetes mellitus;dyslipidemia;hypertension;sedentary lifestyle     Objective:    Today's Vitals   11/11/23 1322  BP: (!) 140/60  Pulse: 64  Resp: 18  Temp: (!) 97.1 F (36.2 C)  SpO2: 98%  Weight: 164 lb (74.4 kg)  Height: 5\' 3"  (1.6 m)   Body mass index is 29.05 kg/m.     09/18/2023   12:08 PM 08/21/2023    2:55 PM 11/04/2022    9:13 AM 10/16/2022    4:21 PM 08/14/2022    9:45 AM 07/16/2022   10:27 AM 07/05/2022    9:36 AM  Advanced Directives  Does Patient Have a Medical Advance Directive? No No;Yes Yes No Yes Yes Yes  Type of Advance Directive  Out of facility DNR (pink MOST or yellow form) Out of facility DNR (pink MOST or yellow form)  Out of facility DNR (pink MOST or yellow form) Out of facility DNR (pink MOST or yellow form) Out of facility DNR (pink MOST or yellow form)  Does patient want to make changes to medical advance directive? No - Patient declined No - Patient declined No - Patient declined  No - Patient declined No - Patient declined No - Patient declined  Would patient like information on creating a medical advance directive?  No - Patient declined No - Patient declined No - Guardian declined       Current Medications (verified) Outpatient Encounter Medications as of 11/11/2023  Medication Sig   acetaminophen  (TYLENOL ) 325 MG tablet Take 650 mg by mouth every 6 (six) hours as needed.    amLODipine  (NORVASC ) 10 MG tablet Take 1 tablet (10 mg total) by mouth daily.   Balsam [place]-Castor Oil (VENELEX) OINT Apply topically. Apply to sacrum and bilateral buttocks qshift  for prevention.   busPIRone (BUSPAR) 5 MG tablet Take 5 mg by mouth 2 (two) times daily.   lisinopril  (ZESTRIL ) 10 MG tablet Take 10 mg by mouth daily.   LORazepam  (ATIVAN ) 0.5 MG tablet Take 0.5 tablets (0.25 mg total) by mouth at bedtime. Give at bedtime   melatonin 5 MG TABS Take 5 mg by mouth at bedtime.   metFORMIN  (GLUCOPHAGE ) 500 MG tablet Take 1 tablet by mouth 2 (two) times daily.   methocarbamol (ROBAXIN) 500 MG tablet Take 500 mg by mouth every 6 (six) hours as needed.   miconazole (ZEASORB-AF) 2 % powder small amt; topical,As Needed Special Instructions: prn as needed under abd folds/under arms or groin   NON FORMULARY Diet Change: Regular   No facility-administered encounter medications on file as of 11/11/2023.    Allergies (verified) Contrast media [iodinated contrast media], Betamethasone valerate, and Cannabinoids   History: Past Medical History:  Diagnosis Date   Arthritis    Coronary artery disease    angina   Diabetes mellitus with vascular complications    History of CVA (cerebrovascular accident)    Past Surgical History:  Procedure Laterality Date   APPENDECTOMY     CHOLECYSTECTOMY     No family history on file. Social History   Socioeconomic History   Marital status: Widowed    Spouse name: Not on file  Number of children: Not on file   Years of education: Not on file   Highest education level: Not on file  Occupational History   Not on file  Tobacco Use   Smoking status: Never   Smokeless tobacco: Never  Vaping Use   Vaping status: Never Used  Substance and Sexual Activity   Alcohol use: No   Drug use: No   Sexual activity: Not on file  Other Topics Concern   Not on file  Social History Narrative   Not on file   Social Drivers of Health   Financial Resource Strain: Not on file  Food Insecurity: Not on file  Transportation Needs: Not on file  Physical Activity: Not on file  Stress: Not on file  Social Connections: Not on file     Tobacco Counseling Counseling given: Not Answered   Clinical Intake:  Pre-visit preparation completed: Yes  Pain : No/denies pain     BMI - recorded: 29.05 Nutritional Status: BMI 25 -29 Overweight Nutritional Risks: Unintentional weight loss, Failure to thrive Diabetes: Yes CBG done?: Yes CBG resulted in Enter/ Edit results?: Yes Did pt. bring in CBG monitor from home?: No  How often do you need to have someone help you when you read instructions, pamphlets, or other written materials from your doctor or pharmacy?: 5 - Always  Interpreter Needed?: No  Comments: long term resident of this facility   Activities of Daily Living    11/11/2023    1:27 PM  In your present state of health, do you have any difficulty performing the following activities:  Hearing? 0  Vision? 0  Difficulty concentrating or making decisions? 1  Walking or climbing stairs? 1  Dressing or bathing? 1  Doing errands, shopping? 1  Preparing Food and eating ? Y  Using the Toilet? Y  In the past six months, have you accidently leaked urine? Y  Do you have problems with loss of bowel control? Y  Managing your Medications? Y  Managing your Finances? Y  Housekeeping or managing your Housekeeping? Y    Patient Care Team: Marilyne Shu, NP as PCP - [facility]  Indicate any recent Medical Services you may have received from other than [facility] providers in the past year (date may be approximate).     Assessment:   This is a routine wellness examination for [place].  Hearing/Vision screen No results found.   Goals Addressed             This Visit's Progress    Absence of Fall and Fall-Related Injury   On track    Evidence-based guidance:  Assess fall risk using a validated tool when available. Consider balance and gait impairment, muscle weakness, diminished vision or hearing, environmental hazards, presence of urinary  or bowel urgency and/or incontinence.  Communicate fall injury risk to interprofessional healthcare team.  Develop a fall prevention plan with the patient and family.  Promote use of personal vision and auditory aids.  Promote reorientation, appropriate sensory stimulation, and routines to decrease risk of fall when changes in mental status are present.  Assess assistance level required for safe and effective self-care; consider referral for home care.  Encourage physical activity, such as performance of self-care at highest level of ability, strength and balance exercise program, and provision of appropriate assistive devices; refer to rehabilitation therapy.  Refer to community-based fall prevention program where available.  If fall occurs, determine the cause and revise  fall injury prevention plan.  Regularly review medication contribution to fall risk; consider risk related to polypharmacy and age.  Refer to pharmacist for consultation when concerns about medications are revealed.  Balance adequate pain management with potential for oversedation.  Provide guidance related to environmental modifications.  Consider supplementation with Vitamin D.   Notes:      DIET - INCREASE WATER INTAKE   On track    Follow up with Primary Care Provider   On track    General - Client will not be readmitted within 30 days ([facility])   On track      Depression Screen    11/11/2023    1:28 PM 09/17/2023    1:39 PM 09/17/2023    1:38 PM 07/22/2023   10:20 AM 11/04/2022   11:36 AM 10/16/2022    4:19 PM 07/23/2022   12:40 PM  PHQ 2/9 Scores  PHQ - 2 Score 0   0 0 0 0  PHQ- 9 Score    0   0  Exception Documentation  Patient refusal Patient refusal        Fall Risk    11/11/2023    1:27 PM 09/17/2023    1:38 PM 07/22/2023   10:20 AM 11/04/2022   11:35 AM 10/16/2022    4:20 PM  Fall Risk   Falls in the past year? 0 0 0 0 0  Number falls in past yr: 0 0 0 0 0  Injury with Fall? 0 0 0 0 0  Risk for fall  due to : Impaired balance/gait;Impaired mobility Impaired balance/gait;Impaired mobility Impaired balance/gait;Impaired mobility Impaired balance/gait;Impaired mobility No Fall Risks  Follow up    Falls evaluation completed Falls evaluation completed    MEDICARE RISK AT HOME: Medicare Risk at Home Any stairs in or around the home?: Yes If so, are there any without handrails?: No Home free of loose throw rugs in walkways, pet beds, electrical cords, etc?: Yes Adequate lighting in your home to reduce risk of falls?: Yes Life alert?: No Use of a cane, walker or w/c?: No (bedbound) Grab bars in the bathroom?: Yes Shower chair or bench in shower?: Yes Elevated toilet seat or a handicapped toilet?: Yes  TIMED UP AND GO:  Was the test performed?  No    Cognitive Function:    11/11/2023    1:29 PM 11/04/2022   11:36 AM 10/24/2021    2:40 PM 10/23/2020    2:36 PM  MMSE - Mini Mental State Exam  Not completed: Unable to complete Refused Refused Refused        11/04/2022   11:36 AM 10/23/2020    2:36 PM  6CIT Screen  What Year? 0 points 0 points  What month? 0 points 0 points  What time? 0 points 0 points  Count back from 20 4 points 2 points  Months in reverse 4 points 2 points  Repeat phrase 4 points 4 points  Total Score 12 points 8 points    Immunizations  There is no immunization history on file for this patient.  TDAP status: Up to date  Flu Vaccine status: Up to date  Pneumococcal vaccine status: Up to date  Covid-19 vaccine status: Completed vaccines  Qualifies for Shingles Vaccine? No   Zostavax completed No   Shingrix Completed?: No.    Education has been provided regarding the importance of this vaccine. Patient has been advised to call insurance company to determine out of pocket expense if they have not  yet received this vaccine. Advised may also receive vaccine at local pharmacy or Health Dept. Verbalized acceptance and understanding.  Screening Tests Health  Maintenance  Topic Date Due   Medicare Annual Wellness (AWV)  11/04/2023   FOOT EXAM  07/16/2024   DEXA SCAN  Completed   HPV VACCINES  Aged Out   Meningococcal B Vaccine  Aged Out   DTaP/Tdap/Td  Discontinued   Pneumonia Vaccine 64+ Years old  Discontinued   INFLUENZA VACCINE  Discontinued   HEMOGLOBIN A1C  Discontinued   OPHTHALMOLOGY EXAM  Discontinued   COVID-19 Vaccine  Discontinued   Zoster Vaccines- Shingrix  Discontinued    Health Maintenance  Health Maintenance Due  Topic Date Due   Medicare Annual Wellness (AWV)  11/04/2023    Colorectal cancer screening: No longer required.   Mammogram status: No longer required due to age.    Lung Cancer Screening: (Low Dose CT Chest recommended if Age 75-80 years, 20 pack-year currently smoking OR have quit w/in 15years.) does not qualify.   Lung Cancer Screening Referral:   Additional Screening:  Hepatitis C Screening: does not qualify; Completed declined   Vision Screening: Recommended annual ophthalmology exams for early detection of glaucoma and other disorders of the eye. Is the patient up to date with their annual eye exam?  No  Who is the provider or what is the name of the office in which the patient attends annual eye exams?  If pt is not established with a provider, would they like to be referred to a provider to establish care? No .   Dental Screening: Recommended annual dental exams for proper oral hygiene  Diabetic Foot Exam: declined   Community Resource Referral / Chronic Care Management: CRR required this visit?  No   CCM required this visit?  No     Plan:     I have personally reviewed and noted the following in the patient's chart:   Medical and social history Use of alcohol, tobacco or illicit drugs  Current medications and supplements including opioid prescriptions. Patient is currently taking opioid prescriptions. Information provided to patient regarding non-opioid alternatives. Patient  advised to discuss non-opioid treatment plan with their provider. Functional ability and status Nutritional status Physical activity Advanced directives List of other physicians Hospitalizations, surgeries, and ER visits in previous 12 months Vitals Screenings to include cognitive, depression, and falls Referrals and appointments  In addition, I have reviewed and discussed with patient certain preventive protocols, quality metrics, and best practice recommendations. A written personalized care plan for preventive services as well as general preventive health recommendations were provided to patient.     Marilyne Shu, NP   11/11/2023   After Visit Summary: (In Person-Printed) AVS printed and given to the patient  Nurse Notes: this exam was performed by myself at this facility

## 2023-11-12 ENCOUNTER — Other Ambulatory Visit: Payer: Self-pay | Admitting: Adult Health

## 2023-11-12 ENCOUNTER — Encounter: Payer: Self-pay | Admitting: Adult Health

## 2023-11-12 ENCOUNTER — Non-Acute Institutional Stay (SKILLED_NURSING_FACILITY): Payer: Self-pay | Admitting: Adult Health

## 2023-11-12 DIAGNOSIS — I69351 Hemiplegia and hemiparesis following cerebral infarction affecting right dominant side: Secondary | ICD-10-CM

## 2023-11-12 DIAGNOSIS — F01518 Vascular dementia, unspecified severity, with other behavioral disturbance: Secondary | ICD-10-CM

## 2023-11-12 DIAGNOSIS — I69391 Dysphagia following cerebral infarction: Secondary | ICD-10-CM

## 2023-11-12 MED ORDER — LORAZEPAM 0.5 MG PO TABS: 0.2500 mg | ORAL_TABLET | Freq: Every day | ORAL | 0 refills | Status: DC

## 2023-11-12 NOTE — Progress Notes (Signed)
 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 There are no reports of uncontrolled pain. She will only take certain medications. There are no reports of agitation or anxiety.    Past Medical History:  Diagnosis Date   Arthritis    Coronary artery disease    angina   Diabetes mellitus with vascular complications    History of CVA (cerebrovascular accident)     Past Surgical History:  Procedure Laterality Date   APPENDECTOMY     CHOLECYSTECTOMY      Social History   Socioeconomic History   Marital status: Widowed    Spouse name: Not on file   Number of children: Not on file   Years of education: Not on file   Highest education level: Not on file  Occupational History   Not on file  Tobacco Use   Smoking status: Never   Smokeless tobacco: Never  Vaping Use   Vaping status: Never Used  Substance and Sexual Activity   Alcohol use: No   Drug use: No   Sexual activity: Not on file  Other Topics Concern   Not on file  Social History Narrative   Not on file   Social Drivers of Health   Financial Resource Strain: Not on file  Food Insecurity: Not on file  Transportation Needs: Not on file  Physical Activity: Not on file  Stress: Not on file  Social Connections: Not  on file  Intimate Partner Violence: Not on file   No family history on file.    VITAL SIGNS BP (!) 140/60   Pulse 64   Temp (!) 97.1 F (36.2 C)   Resp 18   Ht 5\' 3"  (1.6 m)   Wt 164 lb (74.4 kg)   SpO2 98%   BMI 29.05 kg/m   Outpatient Encounter Medications as of 11/12/2023  Medication Sig   acetaminophen  (TYLENOL ) 325 MG tablet Take 650 mg by mouth every 6 (six) hours as needed.    amLODipine  (NORVASC ) 10 MG tablet Take 1 tablet (10 mg total) by mouth daily.   Balsam [place]-Castor Oil (VENELEX) OINT Apply topically. Apply to sacrum and bilateral buttocks qshift for prevention.   busPIRone (BUSPAR) 5 MG tablet Take 5 mg by mouth 2 (two) times daily.   lisinopril  (ZESTRIL ) 10 MG tablet Take 10 mg by mouth daily.   LORazepam  (ATIVAN ) 0.5 MG tablet Take 0.5 tablets (0.25 mg total) by mouth at bedtime. Give at bedtime   melatonin 5 MG TABS Take 5 mg by mouth at bedtime.   metFORMIN  (GLUCOPHAGE ) 500 MG tablet Take 1 tablet by mouth 2 (two) times daily.   methocarbamol (ROBAXIN) 500 MG tablet Take 500 mg by mouth every 6 (six) hours as needed.   miconazole (ZEASORB-AF) 2 %  powder small amt; topical,As Needed Special Instructions: prn as needed under abd folds/under arms or groin   NON FORMULARY Diet Change: Regular   No facility-administered encounter medications on file as of 11/12/2023.     SIGNIFICANT DIAGNOSTIC EXAMS  NO NEW EXAMS.   LABS REVIEWED PREVIOUS   declining all lab work   Review of Systems  Constitutional:  Negative for malaise/fatigue.  Respiratory:  Negative for cough and shortness of breath.   Cardiovascular:  Negative for chest pain, palpitations and leg swelling.  Gastrointestinal:  Negative for abdominal pain, constipation and heartburn.  Musculoskeletal:  Negative for back pain, joint pain and myalgias.  Skin: Negative.   Neurological:  Negative for dizziness.  Psychiatric/Behavioral:  The patient is not nervous/anxious.    Physical  Exam Constitutional:      General: She is not in acute distress.    Appearance: She is well-developed. She is not diaphoretic.  Neck:     Thyroid : No thyromegaly.  Cardiovascular:     Rate and Rhythm: Normal rate and regular rhythm.     Pulses: Normal pulses.     Heart sounds: Normal heart sounds.  Pulmonary:     Effort: Pulmonary effort is normal. No respiratory distress.     Breath sounds: Normal breath sounds.  Abdominal:     General: Bowel sounds are normal. There is no distension.     Palpations: Abdomen is soft.     Tenderness: There is no abdominal tenderness.  Musculoskeletal:     Cervical back: Neck supple.     Right lower leg: No edema.     Left lower leg: No edema.     Comments: Right hemiplegia with contractures     Lymphadenopathy:     Cervical: No cervical adenopathy.  Skin:    General: Skin is warm and dry.  Neurological:     Mental Status: She is alert. Mental status is at baseline.  Psychiatric:        Mood and Affect: Mood normal.       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 Vascular dementia with behavioral disturbance: she declines to be weighed. She continues to have delusions about her cva.    3. Dysphagia due to CVA: without signs of aspiration: is on thin liquids.   PREVIOUS   4. Noncompliance with treatment: she will not allow for blood draws or weights. She will only take certain medications. Will not get out of bed per her choice.   5. Dyslipidemia associated with type 2 diabetes mellitus: is off lipitor per her choice   6. Chronic constipation: will continue senna s twice daily   7. Hypertension associated with type 2 diabetes mellitus: b/p 140/60: will continue lisinopril  10 mg daily and norvasc  10 mg daily  8. Type 2 diabetes mellitus with peripheral artery disease: unknown status; won't allow blood work; will continue metformin  500 mg  twice daily is on ace   9. Major depression with psychotic features: is on ativan  0.25 mg nightly (has failed weans); buspar 5 mg twice daily and is off zoloft per her choice.    Britt Candle NP [facility]   call [phone]

## 2023-12-18 ENCOUNTER — Non-Acute Institutional Stay (SKILLED_NURSING_FACILITY): Payer: Self-pay | Admitting: Adult Health

## 2023-12-18 ENCOUNTER — Encounter: Payer: Self-pay | Admitting: Adult Health

## 2023-12-18 DIAGNOSIS — E1169 Type 2 diabetes mellitus with other specified complication: Secondary | ICD-10-CM

## 2023-12-18 DIAGNOSIS — E785 Hyperlipidemia, unspecified: Secondary | ICD-10-CM | POA: Diagnosis not present

## 2023-12-18 DIAGNOSIS — K5909 Other constipation: Secondary | ICD-10-CM | POA: Diagnosis not present

## 2023-12-18 DIAGNOSIS — Z91199 Patient's noncompliance with other medical treatment and regimen due to unspecified reason: Secondary | ICD-10-CM | POA: Diagnosis not present

## 2023-12-18 NOTE — Progress Notes (Unsigned)
 Location:  [facility] Room Number: [id number] Place of Service:  SNF (31)   CODE STATUS: dnr   Allergies  Allergen Reactions   Contrast Media [Iodinated Contrast Media] Anaphylaxis   Betamethasone Valerate Other (See Comments)   Cannabinoids     Chief Complaint  Patient presents with   Acute Visit    Care plan meeting     HPI:  We have come together for her care plan meeting. *** BIMS*** mood***. She remains in bed all the time per her choice; without falls. She requires***. She is incontinent of bladder and bowel. Dietary: will not allow weights ***. Therapy: ***. She will continue to be followed for her chronic illnesses including: Hypertension associated with type 2 diabetes mellitus  Spastic hemiplegia of right dominant side as late effect of cerebral accident  Vascular dementia with behavioral disturbance  Past Medical History:  Diagnosis Date   Arthritis    Coronary artery disease    angina   Diabetes mellitus with vascular complications    History of CVA (cerebrovascular accident)     Past Surgical History:  Procedure Laterality Date   APPENDECTOMY     CHOLECYSTECTOMY      Social History   Socioeconomic History   Marital status: Widowed    Spouse name: Not on file   Number of children: Not on file   Years of education: Not on file   Highest education level: Not on file  Occupational History   Not on file  Tobacco Use   Smoking status: Never   Smokeless tobacco: Never  Vaping Use   Vaping status: Never Used  Substance and Sexual Activity   Alcohol use: No   Drug use: No   Sexual activity: Not on file  Other Topics Concern   Not on file  Social History Narrative   Not on file   Social Drivers of Health   Financial Resource Strain: Not on file  Food Insecurity: Not on file  Transportation Needs: Not on file  Physical Activity: Not on file  Stress: Not on file  Social Connections: Not on file  Intimate Partner Violence: Not on  file   No family history on file.    VITAL SIGNS BP 118/74   Pulse 68   Temp 97.6 F (36.4 C)   Resp 18   SpO2 95%   Outpatient Encounter Medications as of 12/19/2023  Medication Sig   acetaminophen  (TYLENOL ) 325 MG tablet Take 650 mg by mouth every 6 (six) hours as needed.    amLODipine  (NORVASC ) 10 MG tablet Take 1 tablet (10 mg total) by mouth daily.   Balsam [place]-Castor Oil (VENELEX) OINT Apply topically. Apply to sacrum and bilateral buttocks qshift for prevention.   busPIRone (BUSPAR) 5 MG tablet Take 5 mg by mouth 2 (two) times daily.   lisinopril  (ZESTRIL ) 10 MG tablet Take 10 mg by mouth daily.   LORazepam  (ATIVAN ) 0.5 MG tablet Take 0.5 tablets (0.25 mg total) by mouth at bedtime. Give at bedtime   melatonin 5 MG TABS Take 5 mg by mouth at bedtime.   metFORMIN  (GLUCOPHAGE ) 500 MG tablet Take 1 tablet by mouth 2 (two) times daily.   methocarbamol (ROBAXIN) 500 MG tablet Take 500 mg by mouth every 6 (six) hours as needed.   miconazole (ZEASORB-AF) 2 % powder small amt; topical,As Needed Special Instructions: prn as needed under abd folds/under arms or groin   NON FORMULARY Diet Change: Regular   No facility-administered encounter medications  on file as of 12/19/2023.     SIGNIFICANT DIAGNOSTIC EXAMS  LABS REVIEWED PREVIOUS   declining all lab work   Review of Systems  Constitutional:  Negative for malaise/fatigue.  Respiratory:  Negative for cough and shortness of breath.   Cardiovascular:  Negative for chest pain, palpitations and leg swelling.  Gastrointestinal:  Negative for abdominal pain, constipation and heartburn.  Musculoskeletal:  Negative for back pain, joint pain and myalgias.  Skin: Negative.   Neurological:  Negative for dizziness.  Psychiatric/Behavioral:  The patient is not nervous/anxious.    Physical Exam Constitutional:      General: She is not in acute distress.    Appearance: She is well-developed. She is not diaphoretic.  Neck:      Thyroid : No thyromegaly.  Cardiovascular:     Rate and Rhythm: Normal rate and regular rhythm.     Heart sounds: Normal heart sounds.  Pulmonary:     Effort: Pulmonary effort is normal. No respiratory distress.     Breath sounds: Normal breath sounds.  Abdominal:     General: Bowel sounds are normal. There is no distension.     Palpations: Abdomen is soft.     Tenderness: There is no abdominal tenderness.  Musculoskeletal:     Cervical back: Neck supple.     Right lower leg: No edema.     Left lower leg: No edema.     Comments:  Right hemiplegia with contractures  Lymphadenopathy:     Cervical: No cervical adenopathy.  Skin:    General: Skin is warm and dry.  Neurological:     Mental Status: She is alert. Mental status is at baseline.  Psychiatric:        Mood and Affect: Mood normal.     ASSESSMENT/ PLAN:  TODAY  Hypertension associated with type 2 diabetes mellitus Spastic hemiplegia of right dominant side as late effect of cerebral accident Vascular dementia with behavioral disturbance  Will continue current medications Will continue current plan of care Will continue to monitor her status  Time spent with patient: 40 minutes: dietary; medications; plan of care.    Britt Candle NP [facility]  call [phone]

## 2023-12-18 NOTE — Progress Notes (Signed)
 Location:  [facility] Room Number: 110 Place of Service:  SNF (31)   CODE STATUS: dnr  Allergies  Allergen Reactions   Contrast Media [Iodinated Contrast Media] Anaphylaxis   Betamethasone Valerate Other (See Comments)   Cannabinoids     Chief Complaint  Patient presents with   Medical Management of Chronic Issues          Noncompliance with treatment: Dyslipidemia associated with type 2 diabetes mellitus: Chronic constipation:    HPI:  She is a 89 y.o. long term resident of this facility being seen for the management of her chronic illnesses:Noncompliance with treatment: Dyslipidemia associated with type 2 diabetes mellitus: Chronic constipation. She is not complaining of uncontrolled pain. She remains in bed due to her choice. She does not allow for weights.    Past Medical History:  Diagnosis Date   Arthritis    Coronary artery disease    angina   Diabetes mellitus with vascular complications    History of CVA (cerebrovascular accident)     Past Surgical History:  Procedure Laterality Date   APPENDECTOMY     CHOLECYSTECTOMY      Social History   Socioeconomic History   Marital status: Widowed    Spouse name: Not on file   Number of children: Not on file   Years of education: Not on file   Highest education level: Not on file  Occupational History   Not on file  Tobacco Use   Smoking status: Never   Smokeless tobacco: Never  Vaping Use   Vaping status: Never Used  Substance and Sexual Activity   Alcohol use: No   Drug use: No   Sexual activity: Not on file  Other Topics Concern   Not on file  Social History Narrative   Not on file   Social Drivers of Health   Financial Resource Strain: Not on file  Food Insecurity: Not on file  Transportation Needs: Not on file  Physical Activity: Not on file  Stress: Not on file  Social Connections: Not on file  Intimate Partner Violence: Not on file   No family history on  file.    VITAL SIGNS BP (!) 121/54   Pulse 60   Temp 98 F (36.7 C)   Resp 20   SpO2 98%   Outpatient Encounter Medications as of 12/18/2023  Medication Sig   acetaminophen  (TYLENOL ) 325 MG tablet Take 650 mg by mouth every 6 (six) hours as needed.    amLODipine  (NORVASC ) 10 MG tablet Take 1 tablet (10 mg total) by mouth daily.   Balsam Peru-Castor Oil (VENELEX) OINT Apply topically. Apply to sacrum and bilateral buttocks qshift for prevention.   busPIRone (BUSPAR) 5 MG tablet Take 5 mg by mouth 2 (two) times daily.   lisinopril  (ZESTRIL ) 10 MG tablet Take 10 mg by mouth daily.   LORazepam  (ATIVAN ) 0.5 MG tablet Take 0.5 tablets (0.25 mg total) by mouth at bedtime. Give at bedtime   melatonin 5 MG TABS Take 5 mg by mouth at bedtime.   metFORMIN  (GLUCOPHAGE ) 500 MG tablet Take 1 tablet by mouth 2 (two) times daily.   methocarbamol (ROBAXIN) 500 MG tablet Take 500 mg by mouth every 6 (six) hours as needed.   miconazole (ZEASORB-AF) 2 % powder small amt; topical,As Needed Special Instructions: prn as needed under abd folds/under arms or groin   NON FORMULARY Diet Change: Regular   No facility-administered encounter medications on file as of 12/18/2023.  SIGNIFICANT DIAGNOSTIC EXAMS  LABS REVIEWED PREVIOUS   declining all lab work   Review of Systems  Constitutional:  Negative for malaise/fatigue.  Respiratory:  Negative for cough and shortness of breath.   Cardiovascular:  Negative for chest pain, palpitations and leg swelling.  Gastrointestinal:  Negative for abdominal pain, constipation and heartburn.  Musculoskeletal:  Negative for back pain, joint pain and myalgias.  Skin: Negative.   Neurological:  Negative for dizziness.  Psychiatric/Behavioral:  The patient is not nervous/anxious.    Physical Exam Constitutional:      General: She is not in acute distress.    Appearance: She is well-developed. She is not diaphoretic.  Neck:     Thyroid : No thyromegaly.   Cardiovascular:     Rate and Rhythm: Normal rate and regular rhythm.     Pulses: Normal pulses.     Heart sounds: Normal heart sounds.  Pulmonary:     Effort: Pulmonary effort is normal. No respiratory distress.     Breath sounds: Normal breath sounds.  Abdominal:     General: Bowel sounds are normal. There is no distension.     Palpations: Abdomen is soft.     Tenderness: There is no abdominal tenderness.  Musculoskeletal:     Cervical back: Neck supple.     Right lower leg: No edema.     Left lower leg: No edema.     Comments:  Right hemiplegia with contractures    Lymphadenopathy:     Cervical: No cervical adenopathy.  Skin:    General: Skin is warm and dry.  Neurological:     Mental Status: She is alert. Mental status is at baseline.  Psychiatric:        Mood and Affect: Mood normal.      ASSESSMENT/ PLAN:   TODAY  Noncompliance with treatment: she declines all blood draws and weights. She will only take certain medications at certain doses; she spends all of her time in bed.   2. Dyslipidemia associated with type 2 diabetes mellitus: is off lipitor per her choice  3. Chronic constipation: will continue senna s twice daily   PREVIOUS    4. Hypertension associated with type 2 diabetes mellitus: b/p 121/54: will continue lisinopril  10 mg daily and norvasc  10 mg daily  5. Type 2 diabetes mellitus with peripheral artery disease: unknown status; won't allow blood work; will continue metformin  500 mg twice daily is on ace   6. Major depression with psychotic features: is on ativan  0.25 mg nightly (has failed weans); buspar 5 mg twice daily and is off zoloft per her choice.   7. 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 Vascular dementia with behavioral disturbance: she declines to be weighed. She continues to have delusions about her cva.    9. Dysphagia due to CVA: without signs of aspiration: is  on thin liquids.    Britt Candle NP [facility]   call [phone]

## 2023-12-19 ENCOUNTER — Non-Acute Institutional Stay (SKILLED_NURSING_FACILITY): Payer: Self-pay | Admitting: Adult Health

## 2023-12-19 DIAGNOSIS — E1159 Type 2 diabetes mellitus with other circulatory complications: Secondary | ICD-10-CM

## 2023-12-19 DIAGNOSIS — F01518 Vascular dementia, unspecified severity, with other behavioral disturbance: Secondary | ICD-10-CM | POA: Diagnosis not present

## 2023-12-19 DIAGNOSIS — I69351 Hemiplegia and hemiparesis following cerebral infarction affecting right dominant side: Secondary | ICD-10-CM | POA: Diagnosis not present

## 2023-12-19 DIAGNOSIS — I152 Hypertension secondary to endocrine disorders: Secondary | ICD-10-CM | POA: Diagnosis not present

## 2024-01-12 ENCOUNTER — Other Ambulatory Visit: Payer: Self-pay | Admitting: Adult Health

## 2024-01-12 MED ORDER — LORAZEPAM 0.5 MG PO TABS: 0.2500 mg | ORAL_TABLET | Freq: Every day | ORAL | 0 refills | Status: DC

## 2024-01-13 ENCOUNTER — Non-Acute Institutional Stay (SKILLED_NURSING_FACILITY): Payer: Self-pay | Admitting: Adult Health

## 2024-01-13 ENCOUNTER — Encounter: Payer: Self-pay | Admitting: Adult Health

## 2024-01-13 DIAGNOSIS — F323 Major depressive disorder, single episode, severe with psychotic features: Secondary | ICD-10-CM | POA: Diagnosis not present

## 2024-01-13 DIAGNOSIS — E1151 Type 2 diabetes mellitus with diabetic peripheral angiopathy without gangrene: Secondary | ICD-10-CM

## 2024-01-13 DIAGNOSIS — I152 Hypertension secondary to endocrine disorders: Secondary | ICD-10-CM | POA: Diagnosis not present

## 2024-01-13 DIAGNOSIS — E1159 Type 2 diabetes mellitus with other circulatory complications: Secondary | ICD-10-CM | POA: Diagnosis not present

## 2024-01-13 NOTE — Progress Notes (Signed)
 Location:  [facility] Room Number: 114 Place of Service:  SNF (31)   CODE STATUS: dnr   Allergies  Allergen Reactions   Contrast Media [Iodinated Contrast Media] Anaphylaxis   Betamethasone Valerate Other (See Comments)   Cannabinoids     Chief Complaint  Patient presents with   Medical Management of Chronic Issues        Hypertension associated with type 2 diabetes mellitus Type 2 diabetes mellitus with peripheral artery disease: Major depression with psychotic features    HPI:  She is a 89 y.o. long term resident of this facility being seen for the management of her chronic illnesses:Hypertension associated with type 2 diabetes mellitus Type 2 diabetes mellitus with peripheral artery disease: Major depression with psychotic features. There are no reports of uncontrolled pain.    Past Medical History:  Diagnosis Date   Arthritis    Coronary artery disease    angina   Diabetes mellitus with vascular complications    History of CVA (cerebrovascular accident)     Past Surgical History:  Procedure Laterality Date   APPENDECTOMY     CHOLECYSTECTOMY      Social History   Socioeconomic History   Marital status: Widowed    Spouse name: Not on file   Number of children: Not on file   Years of education: Not on file   Highest education level: Not on file  Occupational History   Not on file  Tobacco Use   Smoking status: Never   Smokeless tobacco: Never  Vaping Use   Vaping status: Never Used  Substance and Sexual Activity   Alcohol use: No   Drug use: No   Sexual activity: Not on file  Other Topics Concern   Not on file  Social History Narrative   Not on file   Social Drivers of Health   Financial Resource Strain: Not on file  Food Insecurity: Not on file  Transportation Needs: Not on file  Physical Activity: Not on file  Stress: Not on file  Social Connections: Not on file  Intimate Partner Violence: Not on file   No family  history on file.    VITAL SIGNS BP 138/70   Pulse 74   Temp (!) 97.2 F (36.2 C)   Resp 20   Ht 5' 3 (1.6 m)   Wt 164 lb (74.4 kg) Comment: old weight  SpO2 97%   BMI 29.05 kg/m   Outpatient Encounter Medications as of 01/13/2024  Medication Sig   acetaminophen  (TYLENOL ) 325 MG tablet Take 650 mg by mouth every 6 (six) hours as needed.    amLODipine  (NORVASC ) 10 MG tablet Take 1 tablet (10 mg total) by mouth daily.   Balsam Peru-Castor Oil (VENELEX) OINT Apply topically. Apply to sacrum and bilateral buttocks qshift for prevention.   busPIRone (BUSPAR) 5 MG tablet Take 5 mg by mouth 2 (two) times daily.   lisinopril  (ZESTRIL ) 10 MG tablet Take 10 mg by mouth daily.   LORazepam  (ATIVAN ) 0.5 MG tablet Take 0.5 tablets (0.25 mg total) by mouth at bedtime. Give at bedtime   melatonin 5 MG TABS Take 5 mg by mouth at bedtime.   metFORMIN  (GLUCOPHAGE ) 500 MG tablet Take 1 tablet by mouth 2 (two) times daily.   methocarbamol (ROBAXIN) 500 MG tablet Take 500 mg by mouth every 6 (six) hours as needed.   miconazole (ZEASORB-AF) 2 % powder small amt; topical,As Needed Special Instructions: prn as needed under abd folds/under arms  or groin   NON FORMULARY Diet Change: Regular   No facility-administered encounter medications on file as of 01/13/2024.     SIGNIFICANT DIAGNOSTIC EXAMS   LABS REVIEWED PREVIOUS   declining all lab work   Review of Systems  Constitutional:  Negative for malaise/fatigue.  Respiratory:  Negative for cough and shortness of breath.   Cardiovascular:  Negative for chest pain, palpitations and leg swelling.  Gastrointestinal:  Negative for abdominal pain, constipation and heartburn.  Musculoskeletal:  Negative for back pain, joint pain and myalgias.  Skin: Negative.   Neurological:  Negative for dizziness.  Psychiatric/Behavioral:  The patient is not nervous/anxious.    Physical Exam Constitutional:      General: She is not in acute distress.     Appearance: She is well-developed. She is not diaphoretic.  Neck:     Thyroid : No thyromegaly.   Cardiovascular:     Rate and Rhythm: Normal rate and regular rhythm.     Pulses: Normal pulses.     Heart sounds: Normal heart sounds.  Pulmonary:     Effort: Pulmonary effort is normal. No respiratory distress.     Breath sounds: Normal breath sounds.  Abdominal:     General: Bowel sounds are normal. There is no distension.     Palpations: Abdomen is soft.     Tenderness: There is no abdominal tenderness.   Musculoskeletal:     Cervical back: Neck supple.     Right lower leg: No edema.     Left lower leg: No edema.     Comments: Right hemiplegia with contractures  Lymphadenopathy:     Cervical: No cervical adenopathy.   Skin:    General: Skin is warm and dry.   Neurological:     Mental Status: She is alert. Mental status is at baseline.   Psychiatric:        Mood and Affect: Mood normal.      ASSESSMENT/ PLAN:  TODAY  Hypertension associated with type 2 diabetes mellitus b/p 138/70 will continue lisinopril  10 mg daily and norvasc  10 mg daily   2. Type 2 diabetes mellitus with peripheral artery disease: unknown status; does not allow blood draws; will continue metformin  500 mg twice daily and is on ace   3. Major depression with psychotic features: is on ativan  0.25 mg nightly (has failed weans) buspar 5 mg twice daily off zoloft per her choice    PREVIOUS    4. 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 Vascular dementia with behavioral disturbance: she declines to be weighed. She continues to have delusions about her cva.    6. Dysphagia due to CVA: without signs of aspiration: is on thin liquids.   7. Noncompliance with treatment: she declines all blood draws and weights. She will only take certain medications at certain doses; she spends all of her time in bed.   8. Dyslipidemia associated with  type 2 diabetes mellitus: is off lipitor per her choice  9. Chronic constipation: will continue senna s twice daily    Barnie Seip NP [facility]   call [phone]

## 2024-02-09 ENCOUNTER — Other Ambulatory Visit: Payer: Self-pay | Admitting: Adult Health

## 2024-02-09 MED ORDER — LORAZEPAM 0.5 MG PO TABS: 0.2500 mg | ORAL_TABLET | Freq: Every day | ORAL | 0 refills | Status: DC

## 2024-02-16 ENCOUNTER — Encounter: Payer: Self-pay | Admitting: Adult Health

## 2024-02-16 ENCOUNTER — Non-Acute Institutional Stay (SKILLED_NURSING_FACILITY): Payer: Self-pay | Admitting: Adult Health

## 2024-02-16 DIAGNOSIS — I69351 Hemiplegia and hemiparesis following cerebral infarction affecting right dominant side: Secondary | ICD-10-CM

## 2024-02-16 DIAGNOSIS — I69391 Dysphagia following cerebral infarction: Secondary | ICD-10-CM | POA: Diagnosis not present

## 2024-02-16 DIAGNOSIS — F01518 Vascular dementia, unspecified severity, with other behavioral disturbance: Secondary | ICD-10-CM

## 2024-02-16 NOTE — Progress Notes (Unsigned)
 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 long term resident of this facility being seen for the management of her chronic illnesses: Hemiplegia right dominant side as late effect of cerebral infarction: Vascular dementia with behavioral disturbance: Dysphagia due to CVA. There are no reports of uncontrolled pain. She has adjusted to her room change without difficulty. She does spend all of her time in bed    Past Medical History:  Diagnosis Date   Arthritis    Coronary artery disease    angina   Diabetes mellitus with vascular complications    History of CVA (cerebrovascular accident)     Past Surgical History:  Procedure Laterality Date   APPENDECTOMY     CHOLECYSTECTOMY      Social History   Socioeconomic History   Marital status: Widowed    Spouse name: Not on file   Number of children: Not on file   Years of education: Not on file   Highest education level: Not on file  Occupational History   Not on file  Tobacco Use   Smoking status: Never   Smokeless tobacco: Never  Vaping Use   Vaping status: Never Used  Substance and Sexual Activity   Alcohol use: No   Drug use: No   Sexual activity: Not on file  Other Topics Concern   Not on file  Social History Narrative   Not on file   Social Drivers of Health   Financial Resource Strain: Not on file  Food Insecurity: Not on file  Transportation Needs: Not on file  Physical Activity: Not on file  Stress: Not on file  Social  Connections: Not on file  Intimate Partner Violence: Not on file   No family history on file.    VITAL SIGNS BP (!) 149/66   Pulse 68   Temp 97.8 F (36.6 C)   Resp 20   SpO2 98%   Outpatient Encounter Medications as of 02/16/2024  Medication Sig   acetaminophen  (TYLENOL ) 325 MG tablet Take 650 mg by mouth every 6 (six) hours as needed.    amLODipine  (NORVASC ) 10 MG tablet Take 1 tablet (10 mg total) by mouth daily.   Balsam [place]-Castor Oil (VENELEX) OINT Apply topically. Apply to sacrum and bilateral buttocks qshift for prevention.   busPIRone (BUSPAR) 5 MG tablet Take 5 mg by mouth 2 (two) times daily.   lisinopril  (ZESTRIL ) 10 MG tablet Take 10 mg by mouth daily.   LORazepam  (ATIVAN ) 0.5 MG tablet Take 0.5 tablets (0.25 mg total) by mouth at bedtime. Give at bedtime   melatonin 5 MG TABS Take 5 mg by mouth at bedtime.   metFORMIN  (GLUCOPHAGE ) 500 MG tablet Take 1 tablet by mouth 2 (two) times daily.   methocarbamol (ROBAXIN) 500 MG tablet Take 500 mg by mouth every 6 (six) hours as needed.   miconazole (ZEASORB-AF) 2 % powder small amt; topical,As Needed Special Instructions: prn as needed under abd  folds/under arms or groin   NON FORMULARY Diet Change: Regular   No facility-administered encounter medications on file as of 02/16/2024.     SIGNIFICANT DIAGNOSTIC EXAMS  LABS REVIEWED PREVIOUS   declining all lab work   Review of Systems  Constitutional:  Negative for malaise/fatigue.  Respiratory:  Negative for cough and shortness of breath.   Cardiovascular:  Negative for chest pain, palpitations and leg swelling.  Gastrointestinal:  Negative for abdominal pain, constipation and heartburn.  Musculoskeletal:  Negative for back pain, joint pain and myalgias.  Skin: Negative.   Neurological:  Negative for dizziness.  Psychiatric/Behavioral:  The patient is not nervous/anxious.     Physical Exam Constitutional:      General: She is not in acute distress.    Appearance:  She is well-developed. She is not diaphoretic.  Neck:     Thyroid : No thyromegaly.  Cardiovascular:     Rate and Rhythm: Normal rate and regular rhythm.     Pulses: Normal pulses.     Heart sounds: Normal heart sounds.  Pulmonary:     Effort: Pulmonary effort is normal. No respiratory distress.     Breath sounds: Normal breath sounds.  Abdominal:     General: Bowel sounds are normal. There is no distension.     Palpations: Abdomen is soft.     Tenderness: There is no abdominal tenderness.  Musculoskeletal:     Cervical back: Neck supple.     Right lower leg: No edema.     Left lower leg: No edema.     Comments:  Right hemiplegia with contractures   Lymphadenopathy:     Cervical: No cervical adenopathy.  Skin:    General: Skin is warm and dry.  Neurological:     Mental Status: She is alert. Mental status is at baseline.  Psychiatric:        Mood and Affect: Mood normal.      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   2. Vascular dementia with behavioral disturbance: she declines weights; she continues with delusional thoughts about her cva.   3. Dysphagia due to CVA: without signs of aspiration present; is on thin liquids.   PREVIOUS    4. Noncompliance with treatment: she declines all blood draws and weights. She will only take certain medications at certain doses; she spends all of her time in bed.   5. Dyslipidemia associated with type 2 diabetes mellitus: is off lipitor per her choice  6. Chronic constipation: will continue senna s twice daily   7. Hypertension associated with type 2 diabetes mellitus b/p 149/66 will continue lisinopril  10 mg daily and norvasc  10 mg daily   8. Type 2 diabetes mellitus with peripheral artery disease: unknown status; does not allow blood draws; will continue metformin  500 mg twice daily and is on ace   9. Major depression with psychotic  features: is on ativan  0.25 mg nightly (has failed weans) buspar 5 mg twice daily off zoloft per her choice     Barnie Seip NP [facility]  call [phone]

## 2024-03-16 ENCOUNTER — Encounter: Payer: Self-pay | Admitting: Adult Health

## 2024-03-16 ENCOUNTER — Non-Acute Institutional Stay (SKILLED_NURSING_FACILITY): Payer: Self-pay | Admitting: Adult Health

## 2024-03-16 DIAGNOSIS — K5909 Other constipation: Secondary | ICD-10-CM | POA: Diagnosis not present

## 2024-03-16 DIAGNOSIS — E785 Hyperlipidemia, unspecified: Secondary | ICD-10-CM

## 2024-03-16 DIAGNOSIS — E1169 Type 2 diabetes mellitus with other specified complication: Secondary | ICD-10-CM | POA: Diagnosis not present

## 2024-03-16 DIAGNOSIS — Z91199 Patient's noncompliance with other medical treatment and regimen due to unspecified reason: Secondary | ICD-10-CM

## 2024-03-16 NOTE — Progress Notes (Unsigned)
 Location:  [facility] Room Number: 110-W Place of Service:  SNF (31) Provider:  Landy Barnie RAMAN, NP  Patient Care Team: Landy Barnie RAMAN, NP as PCP - General (Geriatric Medicine) [facility]  Extended Emergency Contact Information Primary Emergency Contact: Whitt,Gloria [place] [address] [place] Home Phone: [phone] Relation: Daughter Secondary Emergency Contact: Clydell Sharper Mobile Phone: [phone] Relation: Grandson  Code Status:  DNR Goals of care: Advanced Directive information    09/18/2023   12:08 PM  Advanced Directives  Does Patient Have a Medical Advance Directive? No  Does patient want to make changes to medical advance directive? No - Patient declined     Chief Complaint  Patient presents with   Medical Management of Chronic Issues    HPI:  Pt is a 89 y.o. female seen today for medical management of chronic diseases.     Past Medical History:  Diagnosis Date   Arthritis    Coronary artery disease    angina   Diabetes mellitus with vascular complications    History of CVA (cerebrovascular accident)    Past Surgical History:  Procedure Laterality Date   APPENDECTOMY     CHOLECYSTECTOMY      Allergies  Allergen Reactions   Contrast Media [Iodinated Contrast Media] Anaphylaxis   Betamethasone Valerate Other (See Comments)   Cannabinoids     Outpatient Encounter Medications as of 03/16/2024  Medication Sig   amLODipine  (NORVASC ) 10 MG tablet Take 1 tablet (10 mg total) by mouth daily.   Balsam Peru-Castor Oil (VENELEX) OINT Apply topically. Apply to sacrum and bilateral buttocks qshift for prevention. Also apply to groin and vaginal area d/t redness and irritation.   busPIRone (BUSPAR) 7.5 MG tablet Take 7.5 mg by mouth 2 (two) times daily.   lisinopril  (ZESTRIL ) 10 MG tablet Take 10 mg by mouth daily.   LORazepam  (ATIVAN ) 0.5 MG tablet Take 0.5 tablets (0.25 mg  total) by mouth at bedtime. Give at bedtime   melatonin 5 MG TABS Take 5 mg by mouth at bedtime.   metFORMIN  (GLUCOPHAGE ) 500 MG tablet Take 1 tablet by mouth 2 (two) times daily.   NON FORMULARY Diet Change: Regular   acetaminophen  (TYLENOL ) 325 MG tablet Take 650 mg by mouth every 6 (six) hours as needed.    busPIRone (BUSPAR) 5 MG tablet Take 5 mg by mouth 2 (two) times daily. (Patient not taking: Reported on 03/16/2024)   methocarbamol (ROBAXIN) 500 MG tablet Take 500 mg by mouth every 6 (six) hours as needed.   miconazole (ZEASORB-AF) 2 % powder small amt; topical,As Needed Special Instructions: prn as needed under abd folds/under arms or groin (Patient not taking: Reported on 03/16/2024)   No facility-administered encounter medications on file as of 03/16/2024.    Review of Systems   There is no immunization history on file for this patient. Pertinent  Health Maintenance Due  Topic Date Due   FOOT EXAM  07/16/2024   DEXA SCAN  Completed   INFLUENZA VACCINE  Discontinued   HEMOGLOBIN A1C  Discontinued   OPHTHALMOLOGY EXAM  Discontinued      10/16/2022    4:20 PM 11/04/2022   11:35 AM 07/22/2023   10:20 AM 09/17/2023    1:38 PM 11/11/2023    1:27 PM  Fall Risk  Falls in the past year? 0 0 0 0 0  Was there an injury with Fall? 0 0 0 0 0  Fall Risk Category Calculator  0 0 0 0 0  Patient at Risk for Falls Due to No Fall Risks Impaired balance/gait;Impaired mobility Impaired balance/gait;Impaired mobility Impaired balance/gait;Impaired mobility Impaired balance/gait;Impaired mobility  Fall risk Follow up Falls evaluation completed Falls evaluation completed      Functional Status Survey:    Vitals:   03/16/24 0836  BP: 126/62  Pulse: 68  Temp: 98.2 F (36.8 C)  SpO2: 96%  Height: 5' 3 (1.6 m)   Body mass index is 29.05 kg/m. Physical Exam  Labs reviewed: No results for input(s): NA, K, CL, CO2, GLUCOSE, BUN, CREATININE, CALCIUM , MG, PHOS in the  last 8760 hours. No results for input(s): AST, ALT, ALKPHOS, BILITOT, PROT, ALBUMIN in the last 8760 hours. No results for input(s): WBC, NEUTROABS, HGB, HCT, MCV, PLT in the last 8760 hours. Lab Results  Component Value Date   TSH 1.386 04/11/2021   Lab Results  Component Value Date   HGBA1C 6.3 (H) 04/11/2021   Lab Results  Component Value Date   CHOL 115 04/11/2021   HDL 47 04/11/2021   LDLCALC 43 04/11/2021   TRIG 124 04/11/2021   CHOLHDL 2.4 04/11/2021    Significant Diagnostic Results in last 30 days:  No results found.  Assessment/Plan There are no diagnoses linked to this encounter.   Family/ staff Communication: ***  Labs/tests ordered:  ***

## 2024-03-19 ENCOUNTER — Encounter: Payer: Self-pay | Admitting: Adult Health

## 2024-03-19 ENCOUNTER — Non-Acute Institutional Stay (SKILLED_NURSING_FACILITY): Payer: Self-pay | Admitting: Adult Health

## 2024-03-19 DIAGNOSIS — K5909 Other constipation: Secondary | ICD-10-CM | POA: Diagnosis not present

## 2024-03-19 DIAGNOSIS — I152 Hypertension secondary to endocrine disorders: Secondary | ICD-10-CM | POA: Diagnosis not present

## 2024-03-19 DIAGNOSIS — E1159 Type 2 diabetes mellitus with other circulatory complications: Secondary | ICD-10-CM | POA: Diagnosis not present

## 2024-03-19 DIAGNOSIS — F01518 Vascular dementia, unspecified severity, with other behavioral disturbance: Secondary | ICD-10-CM | POA: Diagnosis not present

## 2024-03-19 NOTE — Progress Notes (Signed)
 Location:  [facility] Room Number: 110 W Place of Service:  SNF (31)   CODE STATUS: DNR  Allergies  Allergen Reactions   Contrast Media [Iodinated Contrast Media] Anaphylaxis   Betamethasone Valerate Other (See Comments)   Cannabinoids     Chief Complaint  Patient presents with   Care Plan Meeting     HPI:  We have come together for her care plan meeting. BIMS 15/15 mood 2/30: still complains of prior issues, declining care, family brings in medications creams hot packs. She spends all or her time in bed per her choice without falls. She requires dependent assist with her adl care. She is incontinent of bladder and frequently incontinent of bowel. Dietary: regular diet appetite 01-00% feeds self. Therapy: none at this time. Activities: does not participate. She will continue to be followed for her chronic illnesses including:    Hypertension associated with type 2 diabetes mellitus   Chronic constipation   Vascular dementia with behavioral disturbance  Past Medical History:  Diagnosis Date   Arthritis    Coronary artery disease    angina   Diabetes mellitus with vascular complications    History of CVA (cerebrovascular accident)     Past Surgical History:  Procedure Laterality Date   APPENDECTOMY     CHOLECYSTECTOMY      Social History   Socioeconomic History   Marital status: Widowed    Spouse name: Not on file   Number of children: Not on file   Years of education: Not on file   Highest education level: Not on file  Occupational History   Not on file  Tobacco Use   Smoking status: Never   Smokeless tobacco: Never  Vaping Use   Vaping status: Never Used  Substance and Sexual Activity   Alcohol use: No   Drug use: No   Sexual activity: Not on file  Other Topics Concern   Not on file  Social History Narrative   Not on file   Social Drivers of Health   Financial Resource Strain: Not on file  Food Insecurity: Not on file   Transportation Needs: Not on file  Physical Activity: Not on file  Stress: Not on file  Social Connections: Not on file  Intimate Partner Violence: Not on file   History reviewed. No pertinent family history.    VITAL SIGNS BP 126/62   Pulse 68   Temp 98.2 F (36.8 C)   Resp 18   Ht 5' 3 (1.6 m)   Wt 164 lb (74.4 kg)   SpO2 96%   BMI 29.05 kg/m   Outpatient Encounter Medications as of 03/19/2024  Medication Sig   acetaminophen  (TYLENOL ) 325 MG tablet Take 650 mg by mouth every 6 (six) hours as needed.    amLODipine  (NORVASC ) 10 MG tablet Take 1 tablet (10 mg total) by mouth daily.   Balsam [place]-Castor Oil (VENELEX) OINT Apply topically. Apply to sacrum and bilateral buttocks qshift for prevention. Also apply to groin and vaginal area d/t redness and irritation.   busPIRone (BUSPAR) 7.5 MG tablet Take 7.5 mg by mouth 2 (two) times daily.   lisinopril  (ZESTRIL ) 10 MG tablet Take 10 mg by mouth daily.   LORazepam  (ATIVAN ) 0.5 MG tablet Take 0.5 tablets (0.25 mg total) by mouth at bedtime. Give at bedtime   melatonin 5 MG TABS Take 5 mg by mouth at bedtime.   metFORMIN  (GLUCOPHAGE ) 500 MG tablet Take 1 tablet by mouth 2 (two) times  daily.   NON FORMULARY Diet Change: Regular   busPIRone (BUSPAR) 5 MG tablet Take 5 mg by mouth 2 (two) times daily. (Patient not taking: Reported on 03/19/2024)   methocarbamol (ROBAXIN) 500 MG tablet Take 500 mg by mouth every 6 (six) hours as needed. (Patient not taking: Reported on 03/19/2024)   miconazole (ZEASORB-AF) 2 % powder small amt; topical,As Needed Special Instructions: prn as needed under abd folds/under arms or groin (Patient not taking: Reported on 03/19/2024)   No facility-administered encounter medications on file as of 03/19/2024.     SIGNIFICANT DIAGNOSTIC EXAMS  LABS REVIEWED PREVIOUS   declining all lab work   Review of Systems  Constitutional:  Negative for malaise/fatigue.  Respiratory:  Negative for cough and shortness of  breath.   Cardiovascular:  Negative for chest pain, palpitations and leg swelling.  Gastrointestinal:  Negative for abdominal pain, constipation and heartburn.  Musculoskeletal:  Positive for back pain, joint pain and myalgias.  Skin: Negative.   Neurological:  Negative for dizziness.  Psychiatric/Behavioral:  The patient is not nervous/anxious.     Physical Exam Constitutional:      General: She is not in acute distress.    Appearance: She is well-developed. She is not diaphoretic.  Neck:     Thyroid : No thyromegaly.  Cardiovascular:     Rate and Rhythm: Normal rate and regular rhythm.     Heart sounds: Normal heart sounds.  Pulmonary:     Effort: Pulmonary effort is normal. No respiratory distress.     Breath sounds: Normal breath sounds.  Abdominal:     General: Bowel sounds are normal. There is no distension.     Palpations: Abdomen is soft.     Tenderness: There is no abdominal tenderness.  Musculoskeletal:     Cervical back: Neck supple.     Right lower leg: No edema.     Left lower leg: No edema.     Comments: Right hemiplegia with contractures    Lymphadenopathy:     Cervical: No cervical adenopathy.  Skin:    General: Skin is warm and dry.  Neurological:     Mental Status: She is alert. Mental status is at baseline.  Psychiatric:        Mood and Affect: Mood normal.     ASSESSMENT/ PLAN:  TODAY  Hypertension associated with type 2 diabetes mellitus Chronic constipation Vascular dementia with behavioral disturbance  Will continue current medications Will continue current plan of care Will continue to monitor her status.   Time spent with patient: 40 minutes: medications; plan of care; dietary    Barnie Seip NP [facility]   call [phone]

## 2024-04-02 ENCOUNTER — Non-Acute Institutional Stay (SKILLED_NURSING_FACILITY): Payer: Self-pay | Admitting: Internal Medicine

## 2024-04-02 ENCOUNTER — Encounter: Payer: Self-pay | Admitting: Internal Medicine

## 2024-04-02 DIAGNOSIS — E785 Hyperlipidemia, unspecified: Secondary | ICD-10-CM | POA: Diagnosis not present

## 2024-04-02 DIAGNOSIS — I10 Essential (primary) hypertension: Secondary | ICD-10-CM | POA: Diagnosis not present

## 2024-04-02 DIAGNOSIS — E1151 Type 2 diabetes mellitus with diabetic peripheral angiopathy without gangrene: Secondary | ICD-10-CM | POA: Diagnosis not present

## 2024-04-02 DIAGNOSIS — F0630 Mood disorder due to known physiological condition, unspecified: Secondary | ICD-10-CM

## 2024-04-02 DIAGNOSIS — I69351 Hemiplegia and hemiparesis following cerebral infarction affecting right dominant side: Secondary | ICD-10-CM | POA: Diagnosis not present

## 2024-04-02 DIAGNOSIS — I69398 Other sequelae of cerebral infarction: Secondary | ICD-10-CM

## 2024-04-02 NOTE — Assessment & Plan Note (Signed)
 She remains on metformin ; unfortunately the A1c has not been updated since 04/11/2021 as she refuses labs..  At that time control was excellent with a value of 6.3%.

## 2024-04-02 NOTE — Assessment & Plan Note (Signed)
 She again refuses to update the lipid panel to assess risk; she is not on a statin.

## 2024-04-02 NOTE — Assessment & Plan Note (Signed)
 BP controlled; no change in antihypertensive medications

## 2024-04-02 NOTE — Patient Instructions (Signed)
 See assessment and plan under each diagnosis in the problem list and acutely for this visit

## 2024-04-02 NOTE — Assessment & Plan Note (Signed)
 She refuses update of labs to assess current lipid and diabetes status.  She is requesting psychiatric consultation; but she demands that the subspecialist come to the facility.  She also declined referral to physical medicine specialist for consideration for Botox injections of the fisted right hand.
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
## 2024-04-02 NOTE — Assessment & Plan Note (Signed)
 Neuromuscular deficits persist and are unchanged.

## 2024-05-04 ENCOUNTER — Encounter: Payer: Self-pay | Admitting: Adult Health

## 2024-05-04 ENCOUNTER — Non-Acute Institutional Stay (SKILLED_NURSING_FACILITY): Payer: Self-pay | Admitting: Adult Health

## 2024-05-04 DIAGNOSIS — E1151 Type 2 diabetes mellitus with diabetic peripheral angiopathy without gangrene: Secondary | ICD-10-CM

## 2024-05-04 DIAGNOSIS — F323 Major depressive disorder, single episode, severe with psychotic features: Secondary | ICD-10-CM

## 2024-05-04 DIAGNOSIS — E1159 Type 2 diabetes mellitus with other circulatory complications: Secondary | ICD-10-CM

## 2024-05-04 DIAGNOSIS — I152 Hypertension secondary to endocrine disorders: Secondary | ICD-10-CM | POA: Diagnosis not present

## 2024-05-04 NOTE — Progress Notes (Signed)
 Location:  [facility] Room Number: 108 Place of Service:  SNF (31)   CODE STATUS: dnr  Allergies  Allergen Reactions   Contrast Media [Iodinated Contrast Media] Anaphylaxis   Betamethasone Valerate Other (See Comments)   Cannabinoids     Chief Complaint  Patient presents with   Medical Management of Chronic Issues             Hypertension associated with type 2 diabetes mellitus:  Type 2 diabetes mellitus with peripheral artery disease: Major depression with psychotic features    HPI:  She is a 89 y.o. long term resident of this facility being seen for the management of her chronic illnesses:Hypertension associated with type 2 diabetes mellitus:  Type 2 diabetes mellitus with peripheral artery disease: Major depression with psychotic features. There are no reports of uncontrolled pain. She continues to decline weights and lab work. She has been declining buspar as the manufacturer has changed.    Past Medical History:  Diagnosis Date   Arthritis    Coronary artery disease    angina   Diabetes mellitus with vascular complications    History of CVA (cerebrovascular accident)     Past Surgical History:  Procedure Laterality Date   APPENDECTOMY     CHOLECYSTECTOMY      Social History   Socioeconomic History   Marital status: Widowed    Spouse name: Not on file   Number of children: Not on file   Years of education: Not on file   Highest education level: Not on file  Occupational History   Not on file  Tobacco Use   Smoking status: Never   Smokeless tobacco: Never  Vaping Use   Vaping status: Never Used  Substance and Sexual Activity   Alcohol use: No   Drug use: No   Sexual activity: Not on file  Other Topics Concern   Not on file  Social History Narrative   Not on file   Social Drivers of Health   Financial Resource Strain: Not on file  Food Insecurity: Not on file  Transportation Needs: Not on file  Physical Activity: Not  on file  Stress: Not on file  Social Connections: Not on file  Intimate Partner Violence: Not on file   No family history on file.    VITAL SIGNS BP (!) 142/68   Pulse 74   Temp 98.1 F (36.7 C)   Resp 20   Ht 5' 3 (1.6 m)   Wt 164 lb (74.4 kg) Comment: weight 2022  SpO2 98%   BMI 29.05 kg/m   Outpatient Encounter Medications as of 05/04/2024  Medication Sig   acetaminophen  (TYLENOL ) 325 MG tablet Take 650 mg by mouth every 6 (six) hours as needed.    amLODipine  (NORVASC ) 10 MG tablet Take 1 tablet (10 mg total) by mouth daily.   Balsam [place]-Castor Oil (VENELEX) OINT Apply topically. Apply to sacrum and bilateral buttocks qshift for prevention. Also apply to groin and vaginal area d/t redness and irritation.   busPIRone (BUSPAR) 5 MG tablet Take 5 mg by mouth 2 (two) times daily. (Patient not taking: Reported on 03/19/2024)   busPIRone (BUSPAR) 7.5 MG tablet Take 7.5 mg by mouth 2 (two) times daily.   lisinopril  (ZESTRIL ) 10 MG tablet Take 10 mg by mouth daily.   LORazepam  (ATIVAN ) 0.5 MG tablet Take 0.5 tablets (0.25 mg total) by mouth at bedtime. Give at bedtime   melatonin 5 MG TABS Take 5 mg  by mouth at bedtime.   metFORMIN  (GLUCOPHAGE ) 500 MG tablet Take 1 tablet by mouth 2 (two) times daily.   methocarbamol (ROBAXIN) 500 MG tablet Take 500 mg by mouth every 6 (six) hours as needed. (Patient not taking: Reported on 03/19/2024)   miconazole (ZEASORB-AF) 2 % powder small amt; topical,As Needed Special Instructions: prn as needed under abd folds/under arms or groin (Patient not taking: Reported on 03/19/2024)   NON FORMULARY Diet Change: Regular   No facility-administered encounter medications on file as of 05/04/2024.     SIGNIFICANT DIAGNOSTIC EXAMS  LABS REVIEWED PREVIOUS   declining all lab work   Review of Systems  Constitutional:  Negative for malaise/fatigue.  Respiratory:  Negative for cough and shortness of breath.   Cardiovascular:  Negative for chest pain,  palpitations and leg swelling.  Gastrointestinal:  Negative for abdominal pain, constipation and heartburn.  Musculoskeletal:  Negative for back pain, joint pain and myalgias.  Skin: Negative.   Neurological:  Negative for dizziness.  Psychiatric/Behavioral:  The patient is not nervous/anxious.     Physical Exam Constitutional:      General: She is not in acute distress.    Appearance: She is well-developed. She is not diaphoretic.  Neck:     Thyroid : No thyromegaly.  Cardiovascular:     Rate and Rhythm: Normal rate and regular rhythm.     Heart sounds: Normal heart sounds.  Pulmonary:     Effort: Pulmonary effort is normal. No respiratory distress.     Breath sounds: Normal breath sounds.  Abdominal:     General: Bowel sounds are normal. There is no distension.     Palpations: Abdomen is soft.     Tenderness: There is no abdominal tenderness.  Musculoskeletal:     Cervical back: Neck supple.     Right lower leg: No edema.     Left lower leg: No edema.     Comments:  Right hemiplegia with contractures    Lymphadenopathy:     Cervical: No cervical adenopathy.  Skin:    General: Skin is warm and dry.  Neurological:     Mental Status: She is alert. Mental status is at baseline.  Psychiatric:        Mood and Affect: Mood normal.         ASSESSMENT/ PLAN:  TODAY  Hypertension associated with type 2 diabetes mellitus: b/p 142/68 will continue lisinopril  10 mg daily and norvasc  10 mg daily   2. Type 2 diabetes mellitus with peripheral artery disease: unknown status, she will not allow for blood draws; will continue metformin  500 mg twice daily and is on ace  3. Major depression with psychotic features: is on ativan  0.25 mg nightly (has failed weans) buspar 5 mg twice daily and is off zoloft per her choice.   PREVIOUS    4. 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   5. Vascular dementia with behavioral  disturbance: she declines weights; she continues with delusional thoughts about her cva.   6. Dysphagia due to CVA: without signs of aspiration present; is on thin liquids.   7. Noncompliance with treatment: she declines all blood draws and weights. She will only take her current regimen. Her family will bring in OTC medications and disposable heat pads.   8. Dyslipidemia associated with type 2 diabetes mellitus: is off lipitor due to her choice  9. Chronic constipation: will continue senna s twice daily  Barnie Seip NP [facility]  call [phone]

## 2024-05-07 ENCOUNTER — Other Ambulatory Visit: Payer: Self-pay | Admitting: Adult Health

## 2024-05-07 MED ORDER — LORAZEPAM 0.5 MG PO TABS: 0.2500 mg | ORAL_TABLET | Freq: Every day | ORAL | 0 refills | Status: DC

## 2024-06-01 ENCOUNTER — Encounter: Payer: Self-pay | Admitting: Adult Health

## 2024-06-01 ENCOUNTER — Non-Acute Institutional Stay (SKILLED_NURSING_FACILITY): Payer: Self-pay | Admitting: Adult Health

## 2024-06-01 DIAGNOSIS — I69391 Dysphagia following cerebral infarction: Secondary | ICD-10-CM

## 2024-06-01 DIAGNOSIS — F01518 Vascular dementia, unspecified severity, with other behavioral disturbance: Secondary | ICD-10-CM | POA: Diagnosis not present

## 2024-06-01 DIAGNOSIS — I69351 Hemiplegia and hemiparesis following cerebral infarction affecting right dominant side: Secondary | ICD-10-CM

## 2024-06-01 NOTE — Progress Notes (Unsigned)
 Location:  [facility] Room Number: North/110/W Place of Service:  SNF (31)   CODE STATUS: DNR  Allergies  Allergen Reactions   Contrast Media [Iodinated Contrast Media] Anaphylaxis   Betamethasone Valerate Other (See Comments)   Cannabinoids     Chief Complaint  Patient presents with   Medical Management of Chronic Issues         Hemiplegia right dominant side as late effect of cerebral infarction: Vascular dementia with behavioral disturbance:   Dysphagia due to CVA    HPI:  She is a 89 y.o. 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 She has had her norvasc  and buspar stopped per her request. She is only willing to take her metformin , lisinopril  and ativan . There are no reports of uncontrolled pain.    Past Medical History:  Diagnosis Date   Arthritis    Coronary artery disease    angina   Diabetes mellitus with vascular complications    History of CVA (cerebrovascular accident)     Past Surgical History:  Procedure Laterality Date   APPENDECTOMY     CHOLECYSTECTOMY      Social History   Socioeconomic History   Marital status: Widowed    Spouse name: Not on file   Number of children: Not on file   Years of education: Not on file   Highest education level: Not on file  Occupational History   Not on file  Tobacco Use   Smoking status: Never   Smokeless tobacco: Never  Vaping Use   Vaping status: Never Used  Substance and Sexual Activity   Alcohol use: No   Drug use: No   Sexual activity: Not on file  Other Topics Concern   Not on file  Social History Narrative   Not on file   Social Drivers of Health   Financial Resource Strain: Not on file  Food Insecurity: Not on file  Transportation Needs: Not on file  Physical Activity: Not on file   Stress: Not on file  Social Connections: Not on file  Intimate Partner Violence: Not on file   History reviewed. No pertinent family history.    VITAL SIGNS BP (!) 166/80   Pulse 73   Temp (!) 97.4 F (36.3 C)   Resp 18   Ht 5' 3.2 (1.605 m)   Wt 164 lb (74.4 kg)   SpO2 94%   BMI 28.87 kg/m   Outpatient Encounter Medications as of 06/01/2024  Medication Sig   acetaminophen  (TYLENOL ) 325 MG tablet Take 650 mg by mouth every 6 (six) hours as needed.    Balsam [place]-Castor Oil (VENELEX) OINT Apply topically. Apply to sacrum and bilateral buttocks qshift for prevention. Also apply to groin and vaginal area d/t redness and irritation.   lisinopril  (ZESTRIL ) 10 MG tablet Take 10 mg by mouth daily.   LORazepam  (ATIVAN ) 0.5 MG tablet Take 0.5 tablets (0.25 mg total) by mouth at bedtime. Give at bedtime   melatonin 5 MG TABS Take 5 mg by mouth at bedtime.   metFORMIN  (GLUCOPHAGE ) 500 MG tablet Take 1 tablet by mouth 2 (two) times daily.   No facility-administered encounter medications on file as of 06/01/2024.     SIGNIFICANT DIAGNOSTIC EXAMS   Review of Systems  Constitutional:  Negative for malaise/fatigue.  Respiratory:  Negative for cough  and shortness of breath.   Cardiovascular:  Negative for chest pain, palpitations and leg swelling.  Gastrointestinal:  Negative for abdominal pain, constipation and heartburn.  Musculoskeletal:  Negative for back pain, joint pain and myalgias.  Skin: Negative.   Neurological:  Negative for dizziness.  Psychiatric/Behavioral:  The patient is not nervous/anxious.     Physical Exam Constitutional:      General: She is not in acute distress.    Appearance: She is well-developed. She is not diaphoretic.  Neck:     Thyroid : No thyromegaly.  Cardiovascular:     Rate and Rhythm: Normal rate and regular rhythm.     Heart sounds: Normal heart sounds.  Pulmonary:     Effort: Pulmonary effort is normal. No respiratory distress.     Breath  sounds: Normal breath sounds.  Abdominal:     General: Bowel sounds are normal. There is no distension.     Palpations: Abdomen is soft.     Tenderness: There is no abdominal tenderness.  Musculoskeletal:     Cervical back: Neck supple.     Right lower leg: No edema.     Left lower leg: No edema.     Comments:  Right hemiplegia with contractures  Lymphadenopathy:     Cervical: No cervical adenopathy.  Skin:    General: Skin is warm and dry.  Neurological:     Mental Status: She is alert. Mental status is at baseline.  Psychiatric:        Mood and Affect: Mood normal.      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  2. Vascular dementia with behavioral disturbance: she declines weights; she continue with delusional thought patterns  3. Dysphagia due to CVA: remains on thin liquids without indications of aspiration present.   PREVIOUS    4. Noncompliance with treatment: she declines all blood draws and weights. She will only take her current regimen. Her family will bring in OTC medications and disposable heat pads.   5. Dyslipidemia associated with type 2 diabetes mellitus: is off lipitor due to her choice  6. Chronic constipation: will continue senna s twice daily   7. Hypertension associated with type 2 diabetes mellitus: b/p 166/80 will continue lisinopril  10 mg daily the norvasc  was stopped per her choice    8. Type 2 diabetes mellitus with peripheral artery disease: unknown status, she will not allow for blood draws; will continue metformin  500 mg twice daily and is on ace  9. Major depression with psychotic features: is on ativan  0.25 mg nightly (has failed weans) is off zoloft and buspar due to her choice     Barnie Seip NP [facility]  call [phone]

## 2024-06-07 ENCOUNTER — Other Ambulatory Visit: Payer: Self-pay | Admitting: Adult Health

## 2024-06-07 MED ORDER — LORAZEPAM 0.5 MG PO TABS: 0.2500 mg | ORAL_TABLET | Freq: Every day | ORAL | 0 refills | Status: DC

## 2024-06-30 ENCOUNTER — Non-Acute Institutional Stay (SKILLED_NURSING_FACILITY): Admitting: Internal Medicine

## 2024-06-30 ENCOUNTER — Encounter: Payer: Self-pay | Admitting: Internal Medicine

## 2024-06-30 DIAGNOSIS — L80 Vitiligo: Secondary | ICD-10-CM | POA: Insufficient documentation

## 2024-06-30 DIAGNOSIS — F22 Delusional disorders: Secondary | ICD-10-CM

## 2024-06-30 DIAGNOSIS — I69351 Hemiplegia and hemiparesis following cerebral infarction affecting right dominant side: Secondary | ICD-10-CM

## 2024-06-30 DIAGNOSIS — I10 Essential (primary) hypertension: Secondary | ICD-10-CM

## 2024-06-30 NOTE — Assessment & Plan Note (Signed)
 TSH will be checked if she agrees.
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
## 2024-06-30 NOTE — Patient Instructions (Signed)
 See assessment and plan under each diagnosis in the problem list and acutely for this visit
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
## 2024-06-30 NOTE — Assessment & Plan Note (Addendum)
 Today's blood pressure is an outlier.  Prior blood pressures have ranged from 126/62 up to 142/68.  Blood pressure average will be determined and attempt made to reinitiate low-dose amlodipine  if consistently greater than 140 systolic if she will agree.

## 2024-07-08 ENCOUNTER — Other Ambulatory Visit: Payer: Self-pay | Admitting: Adult Health

## 2024-07-08 MED ORDER — LORAZEPAM 0.5 MG PO TABS: 0.2500 mg | ORAL_TABLET | Freq: Every day | ORAL | 0 refills | Status: DC

## 2024-08-09 ENCOUNTER — Other Ambulatory Visit: Payer: Self-pay | Admitting: Adult Health

## 2024-08-09 MED ORDER — LORAZEPAM 0.5 MG PO TABS: 0.2500 mg | ORAL_TABLET | Freq: Every day | ORAL | 0 refills | Status: AC

## 2024-08-10 ENCOUNTER — Non-Acute Institutional Stay (SKILLED_NURSING_FACILITY): Payer: Self-pay | Admitting: Adult Health

## 2024-08-10 DIAGNOSIS — Z91199 Patient's noncompliance with other medical treatment and regimen due to unspecified reason: Secondary | ICD-10-CM | POA: Diagnosis not present

## 2024-08-10 DIAGNOSIS — Z7984 Long term (current) use of oral hypoglycemic drugs: Secondary | ICD-10-CM | POA: Diagnosis not present

## 2024-08-10 DIAGNOSIS — K5909 Other constipation: Secondary | ICD-10-CM | POA: Diagnosis not present

## 2024-08-10 DIAGNOSIS — E785 Hyperlipidemia, unspecified: Secondary | ICD-10-CM

## 2024-08-10 DIAGNOSIS — E1169 Type 2 diabetes mellitus with other specified complication: Secondary | ICD-10-CM
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
